# Patient Record
Sex: Male | Born: 1940 | Race: Black or African American | Hispanic: No | Smoking: Current every day smoker
Health system: Southern US, Community
[De-identification: ages and names within clinical notes are randomized; demographics above are authoritative.]

## PROBLEM LIST (undated history)

## (undated) DIAGNOSIS — R011 Cardiac murmur, unspecified: Secondary | ICD-10-CM

## (undated) DIAGNOSIS — R972 Elevated prostate specific antigen [PSA]: Secondary | ICD-10-CM

## (undated) DIAGNOSIS — N259 Disorder resulting from impaired renal tubular function, unspecified: Secondary | ICD-10-CM

## (undated) DIAGNOSIS — E669 Obesity, unspecified: Secondary | ICD-10-CM

## (undated) DIAGNOSIS — N400 Enlarged prostate without lower urinary tract symptoms: Secondary | ICD-10-CM

## (undated) DIAGNOSIS — R911 Solitary pulmonary nodule: Secondary | ICD-10-CM

## (undated) DIAGNOSIS — I10 Essential (primary) hypertension: Secondary | ICD-10-CM

## (undated) DIAGNOSIS — N138 Other obstructive and reflux uropathy: Secondary | ICD-10-CM

## (undated) DIAGNOSIS — M25569 Pain in unspecified knee: Secondary | ICD-10-CM

## (undated) DIAGNOSIS — E786 Lipoprotein deficiency: Secondary | ICD-10-CM

## (undated) DIAGNOSIS — R3911 Hesitancy of micturition: Secondary | ICD-10-CM

## (undated) DIAGNOSIS — N401 Enlarged prostate with lower urinary tract symptoms: Secondary | ICD-10-CM

## (undated) DIAGNOSIS — Z801 Family history of malignant neoplasm of trachea, bronchus and lung: Secondary | ICD-10-CM

## (undated) DIAGNOSIS — M1990 Unspecified osteoarthritis, unspecified site: Secondary | ICD-10-CM

## (undated) DIAGNOSIS — Z807 Family history of other malignant neoplasms of lymphoid, hematopoietic and related tissues: Secondary | ICD-10-CM

## (undated) DIAGNOSIS — E119 Type 2 diabetes mellitus without complications: Secondary | ICD-10-CM

## (undated) HISTORY — DX: Hesitancy of micturition: R39.11

## (undated) HISTORY — DX: Pain in unspecified knee: M25.569

## (undated) HISTORY — DX: Obesity, unspecified: E66.9

## (undated) HISTORY — DX: Other obstructive and reflux uropathy: N13.8

## (undated) HISTORY — DX: Benign prostatic hyperplasia with lower urinary tract symptoms: N40.1

## (undated) HISTORY — DX: Disorder resulting from impaired renal tubular function, unspecified: N25.9

## (undated) HISTORY — DX: Solitary pulmonary nodule: R91.1

## (undated) HISTORY — DX: Family history of other malignant neoplasms of lymphoid, hematopoietic and related tissues: Z80.7

## (undated) HISTORY — DX: Lipoprotein deficiency: E78.6

## (undated) HISTORY — DX: Benign prostatic hyperplasia without lower urinary tract symptoms: N40.0

## (undated) HISTORY — DX: Essential (primary) hypertension: I10

## (undated) HISTORY — DX: Family history of malignant neoplasm of trachea, bronchus and lung: Z80.1

## (undated) HISTORY — DX: Elevated prostate specific antigen (PSA): R97.20

## (undated) HISTORY — DX: Type 2 diabetes mellitus without complications: E11.9

---

## 2005-10-11 ENCOUNTER — Ambulatory Visit: Payer: Self-pay | Admitting: Gastroenterology

## 2006-02-25 ENCOUNTER — Ambulatory Visit: Payer: Self-pay | Admitting: Family Medicine

## 2008-10-10 ENCOUNTER — Ambulatory Visit: Payer: Self-pay | Admitting: Family Medicine

## 2009-02-26 ENCOUNTER — Ambulatory Visit: Payer: Self-pay | Admitting: Gastroenterology

## 2010-03-08 HISTORY — PX: HERNIA REPAIR: SHX51

## 2011-01-14 ENCOUNTER — Ambulatory Visit: Payer: Self-pay | Admitting: Surgery

## 2011-01-18 ENCOUNTER — Ambulatory Visit: Payer: Self-pay | Admitting: Surgery

## 2013-01-12 ENCOUNTER — Ambulatory Visit: Payer: Self-pay | Admitting: Family

## 2013-01-12 DIAGNOSIS — I379 Nonrheumatic pulmonary valve disorder, unspecified: Secondary | ICD-10-CM

## 2014-05-17 ENCOUNTER — Observation Stay: Payer: Self-pay | Admitting: Internal Medicine

## 2014-07-07 NOTE — Discharge Summary (Signed)
PATIENT NAME:  Brent Glass, Brent Glass MR#:  [id number] DATE OF BIRTH:  03/10/1940  DATE OF ADMISSION:  05/17/2014 DATE OF DISCHARGE:  05/18/2014  DISCHARGE DIAGNOSES:  1.  Acute renal failure.  2.  Dehydration.  3.  Syncope.  4.  Acute bronchitis.  5.  Diabetes mellitus type 2.  6.  Hyperlipidemia.  7.  Benign prostatic hypertrophy.  8.  Hypertension.   DISCHARGE MEDICATIONS:  1.  Aspirin 81 mg daily.  2.  Metformin 1000 mg oral 2 times a day.  3.  Glipizide 5 mg once a day.  4.  Simvastatin 40 mg daily.  5.  Proventil HFA 2 puffs inhaled 4 times a day.  6.  Hydrochlorothiazide-lisinopril 25/10 at 1/2 tablet once a day.  7.  Azithromycin 5 mg daily.    DISCHARGE INSTRUCTIONS:   Low sodium diet.  Activity as tolerated. Drink plenty of fluids to keep hydrated.   ADMITTING HISTORY AND PHYSICAL:  Please see detailed H and P dictated previously by Dr. Posey Pronto.  In brief, a 74 year old male patient with history of diabetes, hypertension, was at his urology doctor's office, where he had a sudden syncopal episode, was found to be hypotensive, transferred to the Emergency Room.  Here, the patient was found to be dehydrated.  Blood pressure low in the 96E to 95M systolic and admitted to hospitalist service.   HOSPITAL COURSE:  1.  Dehydration with acute renal failure. The patient had worsening creatinine, with elevation up to 1.8 from his baseline of 1.1.  The patient was started on IV fluids with which his blood pressure improved. No further syncope, acute renal failure resolved and he is being discharged home in a stable condition.  2.  The patient was also treated for acute bronchitis during the hospital stay with mild fever,  was started on azithromycin which will be continued after discharge.  The patient does not have more shortness of breath, very minimal wheezing on exam.   Prior to discharge, the patient's lungs sound clear. S1, S2 heard.  No edema. The patient's blood pressure medication is  being cut in half due to low normal blood pressure.   TIME SPENT ON DAY OF DISCHARGE IN DISCHARGE ACTIVITY: 40 minutes.     ____________________________ Leia Alf Kadija Cruzen, MD srs:DT D: 05/20/2014 14:58:50 ET T: 05/20/2014 17:11:07 ET JOB#: [id number]  cc: Alveta Heimlich R. Sloane Junkin, MD, <Dictator> Neita Carp MD ELECTRONICALLY SIGNED 05/22/2014 8:30

## 2014-07-07 NOTE — H&P (Signed)
PATIENT NAME:  Brent Glass, CRIPPS MR#:  [id number] DATE OF BIRTH:  Jul 07, 1940  DATE OF ADMISSION:  05/17/2014  PRIMARY CARE PHYSICIAN:  Dr. Volanda Napoleon at [facility] clinic.   CHIEF COMPLAINT: I passed out today.   HISTORY OF PRESENT ILLNESS: Brent Glass is a 74 year old African-American gentleman with past medical history of hypercholesterolemia and diabetes and hypertension, comes to the Emergency Room after he had a syncopal episode at [facility] office. He had his routine appointment for his prostate evaluation. The patient was in the waiting area, started feeling hot, became diaphoretic, and the next thing the staff found him with a syncopal episode and he had slid down to the floor from his chair. He was brought to the Emergency Room, was found to be hypotensive, appeared cold and clammy. He received some IV fluids and currently is feeling better. His blood pressure admission was systolic in the 35W, current blood pressure is systolic 656. He denies any chest pain, shortness of breath, any focal weakness.   PAST MEDICAL HISTORY:  1.  Hypercholesterolemia.  2.  Type 2 diabetes.   ALLERGIES: No known drug allergies.   MEDICATIONS:  1. Metformin 1000 mg p.o. b.i.d.  2. Glipizide XL 5 mg p.o. daily.  3. Aspirin 81 mg daily.  4. Proventil HFA 1 puff t.i.d. p.r.n.  5. Zocor 40 mg daily.  6. Lisinopril/hydrochlorothiazide 20/25 one tablet daily.   FAMILY HISTORY: Positive for hypertension and diabetes.   SOCIAL HISTORY: Currently smokes, every day smoker. Smoking cessation advised. About 4 minutes spent. Denies any alcohol or any other drug use.   REVIEW OF SYSTEMS:    CONSTITUTIONAL: No fever. Positive for fatigue, weakness.  EYES: No blurred or double vision. No glaucoma or cataract.  EARS, NOSE, AND THROAT: No tinnitus, ear pain, hearing loss, epistaxis.  RESPIRATORY: No cough, wheeze, hemoptysis, COPD.   CARDIOVASCULAR: No chest pain, orthopnea, edema. Positive for syncope.   GASTROINTESTINAL: No nausea, vomiting, diarrhea, abdominal pain. No GERD.  GENITOURINARY: No dysuria, hematuria, frequency.  ENDOCRINE: No polyuria, nocturia, or thyroid problems.  HEMATOLOGY: No anemia or easy bruising or bleeding.  SKIN: No acne, rash. The patient does have some chronic lesions over his face around the nose.  MUSCULOSKELETAL: No arthritis, cramps, or gout.   NEUROLOGIC:  No CVA, TIA,  seizures, or dementia.  PSYCHIATRIC:  No anxiety or depression.   All other systems reviewed and negative.   PHYSICAL EXAMINATION:  GENERAL: The patient is awake, alert, oriented x 3, not in acute distress.  VITAL SIGNS: Afebrile, pulse is 99, blood pressure is 118/72, saturation is 100% on room air.  HEENT: Atraumatic, normocephalic. Pupils PERRLA.  EOM intact. Oral mucosa is moist.  NECK: Supple. No JVD. No carotid bruit.  RESPIRATORY: Clear to auscultation bilaterally. No rales, rhonchi, respiratory distress, or labored breathing.  ABDOMEN: Soft, benign, nontender. No organomegaly. Positive bowel sounds.  NEUROLOGIC: Grossly intact cranial nerves II through XII. No motor or sensory deficit.  PSYCHIATRIC: The patient is awake, alert, oriented x 3.  SKIN: Warm and dry.   LABORATORY DATA:  EKG shows normal sinus rhythm with left anterior fascicular block. I have no old EKG for comparison. Glucose is 188, BUN 21, creatinine 1.79, potassium is 3.5. CBC within normal limits except white count of 18.8. Echo Doppler in 2014 showed EF of 60%-65%, normal global left ventricular function, impaired relaxation pattern of LV diastolic filling, no valvular abnormalities.   ASSESSMENT AND PLAN: A 74 year old, Brent Glass, with history of hypertension,  diabetes, and hyperlipidemia, comes in with:   1.  Syncopal episode with hypotension likely in the setting of dehydration and renal insufficiency. The patient's blood pressure at the doctor's office was systolic in the 95K, on arrival to the Emergency Room  his blood pressure was systolic 99. He received IV fluids. Blood pressure currently is 118/72. He feels relatively much improved. His EKG shows sinus rhythm. No source of infection identified. We will continue IV hydration, hold all blood pressure medications, and resume once blood pressure is stable.  2.  Acute renal insufficiency with mild dehydration. I do not have a baseline creatinine. I will hold off on lisinopril, metformin, hydrochlorothiazide, and hydrate the patient. Once blood pressure is stable and creatinine has improved resume above medications.  3.  History of hypertension, but presently relative hypotension. Hold blood pressure medications for now.  4.  Type 2 diabetes. Continue glipizide. I will hold off on metformin due to elevated creatinine. We will continue sliding scale insulin for now.  5.  Hyperlipidemia. Continue Zocor.  6.  Deep vein thrombosis prophylaxis. Subcutaneous heparin.   The above was discussed with the patient and the patient's daughter.   TIME SPENT: 40 minutes.      ____________________________ Hart Rochester Posey Pronto, MD sap:bu D: 05/17/2014 13:40:51 ET T: 05/17/2014 14:19:38 ET JOB#: [id number]  cc: Liller Yohn A. Posey Pronto, MD, <Dictator> Dr. Volanda Napoleon at [place] MD ELECTRONICALLY SIGNED 05/19/2014 18:21

## 2014-11-06 ENCOUNTER — Encounter: Payer: Self-pay | Admitting: Urology

## 2014-11-13 ENCOUNTER — Encounter: Payer: Self-pay | Admitting: *Deleted

## 2014-11-15 ENCOUNTER — Ambulatory Visit
Admission: RE | Admit: 2014-11-15 | Discharge: 2014-11-15 | Disposition: A | Discharge status: Home / Self Care | Payer: Medicare HMO | Source: Ambulatory Visit | Attending: Physician Assistant | Admitting: Physician Assistant

## 2014-11-15 ENCOUNTER — Other Ambulatory Visit: Payer: Self-pay | Admitting: Physician Assistant

## 2014-11-15 DIAGNOSIS — M1712 Unilateral primary osteoarthritis, left knee: Secondary | ICD-10-CM | POA: Insufficient documentation

## 2014-11-15 DIAGNOSIS — M25562 Pain in left knee: Secondary | ICD-10-CM

## 2014-11-19 ENCOUNTER — Encounter: Payer: Self-pay | Admitting: Urology

## 2014-11-19 ENCOUNTER — Ambulatory Visit (INDEPENDENT_AMBULATORY_CARE_PROVIDER_SITE_OTHER): Payer: Medicare PPO | Admitting: Urology

## 2014-11-19 VITALS — BP 153/76 | HR 82 | Ht 61.75 in | Wt 238.4 lb

## 2014-11-19 DIAGNOSIS — N401 Enlarged prostate with lower urinary tract symptoms: Secondary | ICD-10-CM | POA: Diagnosis not present

## 2014-11-19 DIAGNOSIS — R972 Elevated prostate specific antigen [PSA]: Secondary | ICD-10-CM | POA: Diagnosis not present

## 2014-11-19 DIAGNOSIS — N138 Other obstructive and reflux uropathy: Secondary | ICD-10-CM

## 2014-11-19 MED ORDER — LIDOCAINE HCL 2 % EX GEL
1.0000 "application " | Freq: Once | CUTANEOUS | Status: AC
  Administered 2014-11-19: 1 via URETHRAL

## 2014-11-19 NOTE — Progress Notes (Signed)
Catheter deleted note was on the wrong patient.

## 2014-11-19 NOTE — Progress Notes (Signed)
11/19/2014 10:42 AM   Roxy Cedar 21-Jun-1940 [id number]  Referring provider: No referring provider defined for this encounter.  Chief Complaint  Patient presents with  . Elevated PSA    6 month recheck  . Benign Prostatic Hypertrophy    HPI: Patient is 74 year old African-American male who presents today for his 6 month follow-up for BPH with LUTS and a history of elevated PSA.  BPH WITH LUTS His IPSS score today is 5, which is mild lower urinary tract symptomatology. He is pleased with his quality life due to his urinary symptoms.  He denies any dysuria, hematuria or suprapubic pain.  He also denies any recent fevers, chills, nausea or vomiting.   He does not have a family history of PCa.      IPSS      11/19/14 1000       International Prostate Symptom Score   How often have you had the sensation of not emptying your bladder? Not at All     How often have you had to urinate less than every two hours? Less than 1 in 5 times     How often have you found you stopped and started again several times when you urinated? Not at All     How often have you found it difficult to postpone urination? Less than 1 in 5 times     How often have you had a weak urinary stream? Less than half the time     How often have you had to strain to start urination? Not at All     How many times did you typically get up at night to urinate? 1 Time     Total IPSS Score 5     Quality of Life due to urinary symptoms   If you were to spend the rest of your life with your urinary condition just the way it is now how would you feel about that? Pleased        Score:  1-7 Mild 8-19 Moderate 20-35 Severe  Elevated PSA Patient was found to have an elevated PSA of 4.9 ng/mL on 08/23/2011. A PSA, free at that time was 0.95 indicating a 20% probability of the patient having prostate cancer. We have continue to monitor his PSA every 6 months. It has continued a downward trend.  He has not undergone  biopsy and he has no family history of prostate cancer.   PMH: Past Medical History  Diagnosis Date  . Diabetes   . Low HDL (under 40)   . Knee pain   . Hypertension   . Disorder resulting from impaired renal function   . Obesity   . BPH with obstruction/lower urinary tract symptoms   . BPH (benign prostatic hyperplasia)   . Elevated PSA   . Urinary hesitancy     Surgical History: Past Surgical History  Procedure Laterality Date  . Hernia repair  2012    Home Medications:    Medication List       This list is accurate as of: 11/19/14 11:59 PM.  Always use your most recent med list.               albuterol 108 (90 BASE) MCG/ACT inhaler  Commonly known as:  PROVENTIL HFA;VENTOLIN HFA  Inhale 2 puffs into the lungs every 6 (six) hours as needed for wheezing or shortness of breath.     aspirin 81 MG tablet  Take 81 mg by mouth daily.  glipiZIDE 10 MG 24 hr tablet  Commonly known as:  GLUCOTROL XL  1 BY MOUTH DAILY FOR DIABETES     lisinopril-hydrochlorothiazide 20-25 MG per tablet  Commonly known as:  PRINZIDE,ZESTORETIC  Take 1 tablet by mouth daily.     metFORMIN 1000 MG tablet  Commonly known as:  GLUCOPHAGE  Take 1,000 mg by mouth 2 (two) times daily with a meal.     simvastatin 40 MG tablet  Commonly known as:  ZOCOR  Take 40 mg by mouth daily.        Allergies: No Known Allergies  Family History: Family History  Problem Relation Age of Onset  . Diabetes Mother   . Kidney disease Neg Hx   . Prostate cancer Neg Hx     Social History:  reports that he has been smoking.  He does not have any smokeless tobacco history on file. He reports that he does not drink alcohol or use illicit drugs.  ROS: UROLOGY Frequent Urination?: No Hard to postpone urination?: No Burning/pain with urination?: No Get up at night to urinate?: No Leakage of urine?: No Urine stream starts and stops?: No Trouble starting stream?: No Do you have to strain to  urinate?: No Blood in urine?: No Urinary tract infection?: No Sexually transmitted disease?: No Injury to kidneys or bladder?: No Painful intercourse?: No Weak stream?: No Erection problems?: No Penile pain?: No  Gastrointestinal Nausea?: No Vomiting?: No Indigestion/heartburn?: No Diarrhea?: No Constipation?: No  Constitutional Fever: No Night sweats?: No Weight loss?: No Fatigue?: No  Skin Skin rash/lesions?: No Itching?: No  Eyes Blurred vision?: No Double vision?: No  Ears/Nose/Throat Sore throat?: No Sinus problems?: No  Hematologic/Lymphatic Swollen glands?: No Easy bruising?: No  Cardiovascular Leg swelling?: No Chest pain?: No  Respiratory Cough?: No Shortness of breath?: No  Endocrine Excessive thirst?: No  Musculoskeletal Back pain?: No Joint pain?: Yes  Neurological Headaches?: No Dizziness?: No  Psychologic Depression?: No Anxiety?: No  Physical Exam: BP 153/76 mmHg  Pulse 82  Ht 5' 1.75" (1.568 m)  Wt 238 lb 6.4 oz (108.138 kg)  BMI 43.98 kg/m2  GU: Patient with uncircumcised phallus. Foreskin easily retracted  Urethral meatus is patent.  No penile discharge. No penile lesions or rashes. Scrotum without lesions, cysts, rashes and/or edema.  Testicles are located scrotally bilaterally. No masses are appreciated in the testicles. Left and right epididymis are normal. Rectal: Patient with  normal sphincter tone. Perineum without scarring or rashes. No rectal masses are appreciated. Prostate is approximately 50 grams, no nodules are appreciated. Seminal vesicles are normal.    Laboratory Data:  PSA History:  4.9 ng/mL on 08/23/2011  0.95 on 09/27/2011 PSA,free  3.4 ng/mL on 04/19/2012  3.5 ng/mL on 10/31/2012  3.0 ng/mL on 05/18/2013  3.6 ng/mL on 11/16/2013  Assessment & Plan:    1. BPH (benign prostatic hyperplasia) with LUTS:   Patient's IPSS score is 5/1.  His DRE demonstrates benign enlargement.  We will continue  observation over time with prn avoidance of alcohol/caffeine.  He will follow up in 6 months for a PSA, DRE, PVR and an IPSS.    - PSA  2. Elevated PSA:   Patient was found to have an elevated PSA of 4.9 ng/mL on 08/23/2011. A PSA, free at that time was 0.95 indicating a 20% probability of the patient having prostate cancer. We have continue to monitor his PSA every 6 months. It has continued a downward trend.  He has not undergone  biopsy and he has no family history of prostate cancer.  Return in about 6 months (around 05/19/2015) for IPSS.  [name], [facility] [address] [place], [place] [address] [phone]

## 2014-11-19 NOTE — Progress Notes (Deleted)
Simple Catheter Placement  Due to urinary retention patient is present today for a foley cath placement.  Patient was cleaned and prepped in a sterile fashion with betadine and lidocaine jelly 2% was instilled into the urethra.  A 16 FR foley catheter was inserted, urine return was noted  280ml, urine was clear and dark yellow in color.  The balloon was filled with 10cc of sterile water.  A leg bag was attached for drainage. Patient was also given a night bag to take home and was given instruction on how to change from one bag to another.  Patient was given instruction on proper catheter care.  Patient tolerated well, no complications were noted   Preformed by: Nakira Litzau CMA  

## 2014-11-20 ENCOUNTER — Telehealth: Payer: Self-pay

## 2014-11-20 DIAGNOSIS — R972 Elevated prostate specific antigen [PSA]: Secondary | ICD-10-CM

## 2014-11-20 LAB — PSA: Prostate Specific Ag, Serum: 4.1 ng/mL — ABNORMAL HIGH (ref 0.0–4.0)

## 2014-11-20 NOTE — Telephone Encounter (Signed)
-----   Message from Nori Riis, PA-C sent at 11/20/2014  8:34 AM EDT ----- Patient's PSA is stable.  We will see him in 6 months.  PSA to be drawn before his next appointment.

## 2014-11-20 NOTE — Telephone Encounter (Signed)
VM not set up yet.

## 2014-11-21 DIAGNOSIS — N138 Other obstructive and reflux uropathy: Secondary | ICD-10-CM | POA: Insufficient documentation

## 2014-11-21 DIAGNOSIS — R972 Elevated prostate specific antigen [PSA]: Secondary | ICD-10-CM | POA: Insufficient documentation

## 2014-11-21 DIAGNOSIS — N401 Enlarged prostate with lower urinary tract symptoms: Principal | ICD-10-CM

## 2014-11-21 NOTE — Telephone Encounter (Signed)
Spoke with pt in reference to lab results. Pt voiced understanding. PSA orders placed in future.

## 2014-11-21 NOTE — Telephone Encounter (Signed)
-----   Message from Nori Riis, PA-C sent at 11/20/2014  8:34 AM EDT ----- Patient's PSA is stable.  We will see him in 6 months.  PSA to be drawn before his next appointment.

## 2015-02-21 ENCOUNTER — Other Ambulatory Visit: Payer: Self-pay | Admitting: Physician Assistant

## 2015-02-21 DIAGNOSIS — M2392 Unspecified internal derangement of left knee: Secondary | ICD-10-CM

## 2015-03-14 ENCOUNTER — Ambulatory Visit
Admission: RE | Admit: 2015-03-14 | Discharge: 2015-03-14 | Disposition: A | Discharge status: Home / Self Care | Payer: Medicare PPO | Source: Ambulatory Visit | Attending: Physician Assistant | Admitting: Physician Assistant

## 2015-03-14 DIAGNOSIS — M2392 Unspecified internal derangement of left knee: Secondary | ICD-10-CM | POA: Diagnosis not present

## 2015-03-14 DIAGNOSIS — M25562 Pain in left knee: Secondary | ICD-10-CM | POA: Diagnosis not present

## 2015-04-03 ENCOUNTER — Encounter
Admission: RE | Admit: 2015-04-03 | Discharge: 2015-04-03 | Disposition: A | Discharge status: Home / Self Care | Payer: Medicare PPO | Source: Ambulatory Visit | Attending: Orthopedic Surgery | Admitting: Orthopedic Surgery

## 2015-04-03 DIAGNOSIS — Z01810 Encounter for preprocedural cardiovascular examination: Secondary | ICD-10-CM | POA: Insufficient documentation

## 2015-04-03 DIAGNOSIS — Z01812 Encounter for preprocedural laboratory examination: Secondary | ICD-10-CM | POA: Diagnosis present

## 2015-04-03 HISTORY — DX: Cardiac murmur, unspecified: R01.1

## 2015-04-03 HISTORY — DX: Unspecified osteoarthritis, unspecified site: M19.90

## 2015-04-03 LAB — CBC
HCT: 40.4 % (ref 40.0–52.0)
HEMOGLOBIN: 13.3 g/dL (ref 13.0–18.0)
MCH: 28.1 pg (ref 26.0–34.0)
MCHC: 32.9 g/dL (ref 32.0–36.0)
MCV: 85.5 fL (ref 80.0–100.0)
Platelets: 261 10*3/uL (ref 150–440)
RBC: 4.72 MIL/uL (ref 4.40–5.90)
RDW: 14.4 % (ref 11.5–14.5)
WBC: 9.7 10*3/uL (ref 3.8–10.6)

## 2015-04-03 LAB — BASIC METABOLIC PANEL
ANION GAP: 12 (ref 5–15)
BUN: 15 mg/dL (ref 6–20)
CHLORIDE: 105 mmol/L (ref 101–111)
CO2: 21 mmol/L — ABNORMAL LOW (ref 22–32)
Calcium: 9.5 mg/dL (ref 8.9–10.3)
Creatinine, Ser: 1.11 mg/dL (ref 0.61–1.24)
GFR calc Af Amer: >60 mL/min (ref >60)
GFR calc non Af Amer: >60 mL/min (ref >60)
GLUCOSE: 193 mg/dL — AB (ref 65–99)
POTASSIUM: 3.8 mmol/L (ref 3.5–5.1)
Sodium: 138 mmol/L (ref 135–145)

## 2015-04-03 NOTE — Patient Instructions (Signed)
  Your procedure is scheduled on: April 10, 2015 (Thursday) Report to Day Surgery. [facility]) Second Floor To find out your arrival time please call [phone] between 1PM - 3PM on April 09, 2015 (Wednesday).  Remember: Instructions that are not followed completely may result in serious medical risk, up to and including death, or upon the discretion of your surgeon and anesthesiologist your surgery may need to be rescheduled.    __x__ 1. Do not eat food or drink liquids after midnight. No gum chewing or hard candies.     __x__ 2. No Alcohol for 24 hours before or after surgery.   ____ 3. Bring all medications with you on the day of surgery if instructed.    __x__ 4. Notify your doctor if there is any change in your medical condition     (cold, fever, infections).     Do not wear jewelry, make-up, hairpins, clips or nail polish.  Do not wear lotions, powders, or perfumes. You may wear deodorant.  Do not shave 48 hours prior to surgery. Men may shave face and neck.  Do not bring valuables to the hospital.    [facility] is not responsible for any belongings or valuables.               Contacts, dentures or bridgework may not be worn into surgery.  Leave your suitcase in the car. After surgery it may be brought to your room.  For patients admitted to the hospital, discharge time is determined by your                treatment team.   Patients discharged the day of surgery will not be allowed to drive home.   Please read over the following fact sheets that you were given:   Surgical Site Infection Prevention   ____ Take these medicines the morning of surgery with A SIP OF WATER:    1.   2.   3.   4.  5.  6.  ____ Fleet Enema (as directed)   __x__ Use CHG Soap as directed  __x__ Use inhalers on the day of surgery (Use Albuterol inhaler the day of surgery and bring to hospital)  __x__ Stop metformin 2 days prior to surgery(Stop Metformin on January 31)    ____ Take  1/2 of usual insulin dose the night before surgery and none on the morning of surgery.       _x_ Stop Coumadin/Plavix/aspirin on (Stop Aspirin now)   __x_ Stop Anti-inflammatories on (NO NSAIDS, Tylenol ok to take for pain if needed)   ____ Stop supplements until after surgery.    ____ Bring C-Pap to the hospital.

## 2015-04-10 ENCOUNTER — Ambulatory Visit: Payer: Medicare PPO | Admitting: Anesthesiology

## 2015-04-10 ENCOUNTER — Encounter
Admission: RE | Disposition: A | Discharge status: Home / Self Care | Payer: Self-pay | Source: Ambulatory Visit | Attending: Orthopedic Surgery

## 2015-04-10 ENCOUNTER — Ambulatory Visit
Admission: RE | Admit: 2015-04-10 | Discharge: 2015-04-10 | Disposition: A | Discharge status: Home / Self Care | Payer: Medicare PPO | Source: Ambulatory Visit | Attending: Orthopedic Surgery | Admitting: Orthopedic Surgery

## 2015-04-10 DIAGNOSIS — M7122 Synovial cyst of popliteal space [Baker], left knee: Secondary | ICD-10-CM | POA: Insufficient documentation

## 2015-04-10 DIAGNOSIS — Z7951 Long term (current) use of inhaled steroids: Secondary | ICD-10-CM | POA: Insufficient documentation

## 2015-04-10 DIAGNOSIS — Z7982 Long term (current) use of aspirin: Secondary | ICD-10-CM | POA: Insufficient documentation

## 2015-04-10 DIAGNOSIS — I10 Essential (primary) hypertension: Secondary | ICD-10-CM | POA: Insufficient documentation

## 2015-04-10 DIAGNOSIS — M6752 Plica syndrome, left knee: Secondary | ICD-10-CM | POA: Diagnosis not present

## 2015-04-10 DIAGNOSIS — R011 Cardiac murmur, unspecified: Secondary | ICD-10-CM | POA: Diagnosis not present

## 2015-04-10 DIAGNOSIS — S83232A Complex tear of medial meniscus, current injury, left knee, initial encounter: Secondary | ICD-10-CM | POA: Insufficient documentation

## 2015-04-10 DIAGNOSIS — J449 Chronic obstructive pulmonary disease, unspecified: Secondary | ICD-10-CM | POA: Diagnosis not present

## 2015-04-10 DIAGNOSIS — E119 Type 2 diabetes mellitus without complications: Secondary | ICD-10-CM | POA: Insufficient documentation

## 2015-04-10 DIAGNOSIS — X58XXXA Exposure to other specified factors, initial encounter: Secondary | ICD-10-CM | POA: Insufficient documentation

## 2015-04-10 DIAGNOSIS — Z7984 Long term (current) use of oral hypoglycemic drugs: Secondary | ICD-10-CM | POA: Insufficient documentation

## 2015-04-10 DIAGNOSIS — Z833 Family history of diabetes mellitus: Secondary | ICD-10-CM | POA: Diagnosis not present

## 2015-04-10 DIAGNOSIS — M659 Synovitis and tenosynovitis, unspecified: Secondary | ICD-10-CM | POA: Diagnosis not present

## 2015-04-10 DIAGNOSIS — N289 Disorder of kidney and ureter, unspecified: Secondary | ICD-10-CM | POA: Diagnosis not present

## 2015-04-10 DIAGNOSIS — Z8249 Family history of ischemic heart disease and other diseases of the circulatory system: Secondary | ICD-10-CM | POA: Diagnosis not present

## 2015-04-10 DIAGNOSIS — F17200 Nicotine dependence, unspecified, uncomplicated: Secondary | ICD-10-CM | POA: Insufficient documentation

## 2015-04-10 DIAGNOSIS — Z79899 Other long term (current) drug therapy: Secondary | ICD-10-CM | POA: Insufficient documentation

## 2015-04-10 DIAGNOSIS — S83239A Complex tear of medial meniscus, current injury, unspecified knee, initial encounter: Secondary | ICD-10-CM | POA: Diagnosis present

## 2015-04-10 HISTORY — PX: KNEE ARTHROSCOPY: SHX127

## 2015-04-10 LAB — GLUCOSE, CAPILLARY
GLUCOSE-CAPILLARY: 165 mg/dL — AB (ref 65–99)
Glucose-Capillary: 232 mg/dL — ABNORMAL HIGH (ref 65–99)

## 2015-04-10 SURGERY — ARTHROSCOPY, KNEE
Anesthesia: General | Laterality: Left | Wound class: Clean

## 2015-04-10 MED ORDER — ONDANSETRON HCL 4 MG/2ML IJ SOLN
INTRAMUSCULAR | Status: DC | PRN
  Administered 2015-04-10: 4 mg via INTRAVENOUS

## 2015-04-10 MED ORDER — FENTANYL CITRATE (PF) 100 MCG/2ML IJ SOLN
25.0000 ug | INTRAMUSCULAR | Status: DC | PRN
  Administered 2015-04-10 (×4): 25 ug via INTRAVENOUS

## 2015-04-10 MED ORDER — ACETAMINOPHEN 10 MG/ML IV SOLN
INTRAVENOUS | Status: DC | PRN
  Administered 2015-04-10: 1000 mg via INTRAVENOUS

## 2015-04-10 MED ORDER — ONDANSETRON HCL 4 MG/2ML IJ SOLN: 4.0000 mg | Freq: Once | INTRAMUSCULAR | Status: DC | PRN

## 2015-04-10 MED ORDER — SODIUM CHLORIDE 0.9 % IV SOLN: INTRAVENOUS | Status: DC

## 2015-04-10 MED ORDER — BUPIVACAINE-EPINEPHRINE (PF) 0.5% -1:200000 IJ SOLN
INTRAMUSCULAR | Status: AC
  Filled 2015-04-10: qty 30

## 2015-04-10 MED ORDER — LIDOCAINE HCL (CARDIAC) 20 MG/ML IV SOLN
INTRAVENOUS | Status: DC | PRN
  Administered 2015-04-10: 100 mg via INTRAVENOUS

## 2015-04-10 MED ORDER — FAMOTIDINE 20 MG PO TABS
20.0000 mg | ORAL_TABLET | Freq: Once | ORAL | Status: AC
  Administered 2015-04-10: 20 mg via ORAL

## 2015-04-10 MED ORDER — METOCLOPRAMIDE HCL 10 MG PO TABS: 5.0000 mg | ORAL_TABLET | Freq: Three times a day (TID) | ORAL | Status: DC | PRN

## 2015-04-10 MED ORDER — METOCLOPRAMIDE HCL 5 MG/ML IJ SOLN: 5.0000 mg | Freq: Three times a day (TID) | INTRAMUSCULAR | Status: DC | PRN

## 2015-04-10 MED ORDER — MIDAZOLAM HCL 5 MG/5ML IJ SOLN
INTRAMUSCULAR | Status: DC | PRN
  Administered 2015-04-10: 2 mg via INTRAVENOUS

## 2015-04-10 MED ORDER — ONDANSETRON HCL 4 MG/2ML IJ SOLN: 4.0000 mg | Freq: Four times a day (QID) | INTRAMUSCULAR | Status: DC | PRN

## 2015-04-10 MED ORDER — FAMOTIDINE 20 MG PO TABS
ORAL_TABLET | ORAL | Status: AC
  Administered 2015-04-10: 20 mg via ORAL
  Filled 2015-04-10: qty 1

## 2015-04-10 MED ORDER — ONDANSETRON HCL 4 MG PO TABS: 4.0000 mg | ORAL_TABLET | Freq: Four times a day (QID) | ORAL | Status: DC | PRN

## 2015-04-10 MED ORDER — HYDROCODONE-ACETAMINOPHEN 5-325 MG PO TABS: 1.0000 | ORAL_TABLET | ORAL | Status: DC | PRN

## 2015-04-10 MED ORDER — FENTANYL CITRATE (PF) 100 MCG/2ML IJ SOLN
INTRAMUSCULAR | Status: DC | PRN
  Administered 2015-04-10 (×3): 50 ug via INTRAVENOUS

## 2015-04-10 MED ORDER — SODIUM CHLORIDE 0.9 % IV SOLN
INTRAVENOUS | Status: DC
  Administered 2015-04-10: 10:00:00 via INTRAVENOUS

## 2015-04-10 MED ORDER — HYDROCODONE-ACETAMINOPHEN 5-325 MG PO TABS: 1.0000 | ORAL_TABLET | Freq: Four times a day (QID) | ORAL | Status: DC | PRN

## 2015-04-10 MED ORDER — FENTANYL CITRATE (PF) 100 MCG/2ML IJ SOLN
INTRAMUSCULAR | Status: AC
  Administered 2015-04-10: 25 ug via INTRAVENOUS
  Filled 2015-04-10: qty 2

## 2015-04-10 MED ORDER — PROPOFOL 10 MG/ML IV BOLUS
INTRAVENOUS | Status: DC | PRN
  Administered 2015-04-10: 200 mg via INTRAVENOUS

## 2015-04-10 MED ORDER — ACETAMINOPHEN 10 MG/ML IV SOLN
INTRAVENOUS | Status: AC
  Filled 2015-04-10: qty 100

## 2015-04-10 SURGICAL SUPPLY — 30 items
BANDAGE ACE 4X5 VEL STRL LF (GAUZE/BANDAGES/DRESSINGS) ×3 IMPLANT
BANDAGE ELASTIC 4 LF NS (GAUZE/BANDAGES/DRESSINGS) ×3 IMPLANT
BLADE FULL RADIUS 3.5 (BLADE) ×3 IMPLANT
BLADE INCISOR PLUS 4.5 (BLADE) ×3 IMPLANT
BLADE SHAVER 4.5 DBL SERAT CV (CUTTER) IMPLANT
BLADE SHAVER 4.5X7 STR FR (MISCELLANEOUS) IMPLANT
CHLORAPREP W/TINT 26ML (MISCELLANEOUS) ×3 IMPLANT
CUTTER AGGRESSIVE+ 3.5 (CUTTER) IMPLANT
GAUZE PETRO XEROFOAM 1X8 (MISCELLANEOUS) ×3 IMPLANT
GAUZE SPONGE 4X4 12PLY STRL (GAUZE/BANDAGES/DRESSINGS) ×3 IMPLANT
GLOVE BIO SURGEON STRL SZ7 (GLOVE) ×3 IMPLANT
GLOVE BIOGEL PI IND STRL 9 (GLOVE) ×1 IMPLANT
GLOVE BIOGEL PI INDICATOR 9 (GLOVE) ×2
GLOVE INDICATOR 7.5 STRL GRN (GLOVE) ×3 IMPLANT
GLOVE SURG ORTHO 9.0 STRL STRW (GLOVE) ×3 IMPLANT
GOWN SPECIALTY ULTRA XL (MISCELLANEOUS) ×3 IMPLANT
GOWN STRL REUS W/ TWL LRG LVL3 (GOWN DISPOSABLE) ×1 IMPLANT
GOWN STRL REUS W/TWL LRG LVL3 (GOWN DISPOSABLE) ×2
IV LACTATED RINGER IRRG 3000ML (IV SOLUTION) ×8
IV LR IRRIG 3000ML ARTHROMATIC (IV SOLUTION) ×4 IMPLANT
KIT RM TURNOVER STRD PROC AR (KITS) ×3 IMPLANT
MANIFOLD NEPTUNE II (INSTRUMENTS) ×3 IMPLANT
PACK ARTHROSCOPY KNEE (MISCELLANEOUS) ×3 IMPLANT
SET TUBE SUCT SHAVER OUTFL 24K (TUBING) ×3 IMPLANT
SET TUBE TIP INTRA-ARTICULAR (MISCELLANEOUS) ×3 IMPLANT
SUT ETHILON 4-0 (SUTURE)
SUT ETHILON 4-0 FS2 18XMFL BLK (SUTURE)
SUTURE ETHLN 4-0 FS2 18XMF BLK (SUTURE) IMPLANT
TUBING ARTHRO INFLOW-ONLY STRL (TUBING) ×3 IMPLANT
WAND HAND CNTRL MULTIVAC 50 (MISCELLANEOUS) IMPLANT

## 2015-04-10 NOTE — Anesthesia Procedure Notes (Signed)
Procedure Name: LMA Insertion Date/Time: 04/10/2015 12:24 PM Performed by: Delaney Meigs Pre-anesthesia Checklist: Patient identified, Emergency Drugs available, Suction available, Patient being monitored and Timeout performed Patient Re-evaluated:Patient Re-evaluated prior to inductionOxygen Delivery Method: Circle system utilized and Simple face mask Preoxygenation: Pre-oxygenation with 100% oxygen Intubation Type: IV induction Ventilation: Mask ventilation without difficulty LMA Size: 4.5 Number of attempts: 1 Placement Confirmation: breath sounds checked- equal and bilateral and positive ETCO2 Tube secured with: Tape

## 2015-04-10 NOTE — Transfer of Care (Signed)
Immediate Anesthesia Transfer of Care Note  Patient: Brent Glass  Procedure(s) Performed: Procedure(s): ARTHROSCOPY KNEE, partial medial meniscectomy, partial synovectomy (Left)  Patient Location: PACU  Anesthesia Type:General  Level of Consciousness: sedated  Airway & Oxygen Therapy: Patient Spontanous Breathing and Patient connected to face mask oxygen  Post-op Assessment: Report given to RN and Post -op Vital signs reviewed and stable  Post vital signs: Reviewed and stable  Last Vitals:  Filed Vitals:   04/10/15 0926  BP: 121/63  Pulse: 86  Temp: 37.2 C  Resp: 18    Complications: No apparent anesthesia complications

## 2015-04-10 NOTE — Op Note (Signed)
04/10/2015  1:21 PM  PATIENT:  Brent Glass  75 y.o. 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 After patient education timeout procedures were completed an inferior lateral portal was made and the arthroscope was introduced initial inspection revealed extensive synovitis in the suprapatellar pouch and a thick plica band extending across the medial femoral condyle and in the patellofemoral joint that appeared to impinge on the joint coming out to the trochlear groove there was significant degenerative changes to the patella and trochlear groove but no exposed bone there was significant partial-thickness cartilage loss over the entire patella. The patella tracked normally coming the medial compartment inferior medial portal was made and on probing there is a complex tear of the medial meniscus with stump medial centrally and very posteriorly the could be displaced into the joint this was debrided with use of a meniscal punch and shaver followed by her care wand to get back to a stable margin is also Baker cyst on the medial side and the posterior capsules opened to drain this through small opening posteriorly through the meniscus tear. Articular cartilage showed significant wear of the  medial compartment with some areas of exposed bone and loose flaps of cartilage were debrided. The anterior cruciate ligament was intact and lateral compartment was relatively normal with just some very mild fibrillation changes to the articular cartilage and normal meniscus the gutters were checked were free of any loose bodies this she the shaver was then used to debride the extensive plica band no longer impinged over the femoral condyle or the patellofemoral joint. The knee was then irrigated until clear. Argentation was withdrawn and the portals injected with a total of 20 cc half percent Sensorcaine for postop analgesia Xeroform 4 x 4 web roll and Ace wrap were applied  PLAN OF CARE: Discharge to home after PACU  PATIENT DISPOSITION:  PACU - hemodynamically stable.

## 2015-04-10 NOTE — Anesthesia Postprocedure Evaluation (Signed)
Anesthesia Post Note  Patient: Brent Glass  Procedure(s) Performed: Procedure(s) (LRB): ARTHROSCOPY KNEE, partial medial meniscectomy, partial synovectomy (Left)  Patient location during evaluation: PACU Anesthesia Type: General Level of consciousness: awake and alert Pain management: satisfactory to patient Vital Signs Assessment: post-procedure vital signs reviewed and stable Respiratory status: respiratory function stable and patient connected to nasal cannula oxygen Cardiovascular status: blood pressure returned to baseline Anesthetic complications: no    Last Vitals:  Filed Vitals:   04/10/15 0926 04/10/15 1326  BP: 121/63 141/79  Pulse: 86 86  Temp: 37.2 C 36.4 C  Resp: 18 16    Last Pain: There were no vitals filed for this visit.               VAN STAVEREN,[name]

## 2015-04-10 NOTE — Discharge Instructions (Signed)
Aspirin 1 a day. Leave dressing in place unless it sized down the leg. Keep dressing clean and dry. Weightbearing as tolerated on left leg

## 2015-04-10 NOTE — Anesthesia Preprocedure Evaluation (Signed)
Anesthesia Evaluation  Patient identified by MRN, date of birth, ID band Patient awake    Reviewed: Allergy & Precautions, NPO status , Patient's Chart, lab work & pertinent test results  Airway Mallampati: II       Dental no notable dental hx.    Pulmonary COPD, Current Smoker,    + rhonchi  + decreased breath sounds      Cardiovascular Exercise Tolerance: Good hypertension, Pt. on medications  Rhythm:Regular     Neuro/Psych    GI/Hepatic negative GI ROS, Neg liver ROS,   Endo/Other  diabetes, Type 2, Oral Hypoglycemic Agents  Renal/GU negative Renal ROS     Musculoskeletal   Abdominal Normal abdominal exam  (+)   Peds  Hematology   Anesthesia Other Findings   Reproductive/Obstetrics                             Anesthesia Physical Anesthesia Plan  ASA: III  Anesthesia Plan: General   Post-op Pain Management:    Induction: Intravenous  Airway Management Planned: LMA  Additional Equipment:   Intra-op Plan:   Post-operative Plan: Extubation in OR  Informed Consent: I have reviewed the patients History and Physical, chart, labs and discussed the procedure including the risks, benefits and alternatives for the proposed anesthesia with the patient or authorized representative who has indicated his/her understanding and acceptance.     Plan Discussed with: CRNA  Anesthesia Plan Comments:         Anesthesia Quick Evaluation

## 2015-04-10 NOTE — H&P (Signed)
Reviewed paper H+P, will be scanned into chart. No changes noted.  

## 2015-05-19 ENCOUNTER — Other Ambulatory Visit: Payer: Medicare PPO

## 2015-05-22 ENCOUNTER — Ambulatory Visit (INDEPENDENT_AMBULATORY_CARE_PROVIDER_SITE_OTHER): Payer: Medicare PPO | Admitting: Urology

## 2015-05-22 ENCOUNTER — Encounter: Payer: Self-pay | Admitting: Urology

## 2015-05-22 VITALS — BP 151/77 | HR 93 | Ht 72.0 in | Wt 227.7 lb

## 2015-05-22 DIAGNOSIS — R972 Elevated prostate specific antigen [PSA]: Secondary | ICD-10-CM

## 2015-05-22 DIAGNOSIS — N138 Other obstructive and reflux uropathy: Secondary | ICD-10-CM

## 2015-05-22 DIAGNOSIS — N401 Enlarged prostate with lower urinary tract symptoms: Secondary | ICD-10-CM

## 2015-05-22 NOTE — Progress Notes (Signed)
10:48 AM   Brent Glass 04/23/1940 [id number]  Referring provider: Nat Christen, PA-C [address] [place], [place] [address]  No chief complaint on file.   HPI: Patient is 75 year old African-American male who presents today for his 6 month follow-up for BPH with LUTS and a history of elevated PSA.  BPH WITH LUTS His IPSS score today is 10, which is moderate lower urinary tract symptomatology. He is pleased with his quality life due to his urinary symptoms.  He denies any dysuria, hematuria or suprapubic pain.  He also denies any recent fevers, chills, nausea or vomiting.   He does not have a family history of PCa.     IPSS      05/22/15 1000       International Prostate Symptom Score   How often have you had the sensation of not emptying your bladder? Less than 1 in 5     How often have you had to urinate less than every two hours? Less than half the time     How often have you found you stopped and started again several times when you urinated? Less than 1 in 5 times     How often have you found it difficult to postpone urination? More than half the time     How often have you had a weak urinary stream? Less than 1 in 5 times     How often have you had to strain to start urination? Not at All     How many times did you typically get up at night to urinate? 1 Time     Total IPSS Score 10     Quality of Life due to urinary symptoms   If you were to spend the rest of your life with your urinary condition just the way it is now how would you feel about that? Pleased        Score:  1-7 Mild 8-19 Moderate 20-35 Severe  Elevated PSA Patient was found to have an elevated PSA of 4.9 ng/mL on 08/23/2011. A PSA, free at that time was 0.95 indicating a 20% probability of the patient having prostate cancer. His most recent PSA is 4.1 ng/mL on 11/19/2014.  We have continue to monitor his PSA every 6 months. It has continued a downward trend.  He has not undergone biopsy  and he has no family history of prostate cancer.   PMH: Past Medical History  Diagnosis Date  . Diabetes ([place])   . Low HDL (under 40)   . Knee pain   . Hypertension   . Disorder resulting from impaired renal function   . Obesity   . BPH with obstruction/lower urinary tract symptoms   . BPH (benign prostatic hyperplasia)   . Elevated PSA   . Urinary hesitancy   . Heart murmur   . Arthritis     Surgical History: Past Surgical History  Procedure Laterality Date  . Hernia repair  0000000    Umbilical Hernia Repair  . Knee arthroscopy Left 04/10/2015    Procedure: ARTHROSCOPY KNEE, partial medial meniscectomy, partial synovectomy;  Surgeon: Hessie Knows, MD;  Location: [facility] ORS;  Service: Orthopedics;  Laterality: Left;    Home Medications:    Medication List       This list is accurate as of: 05/22/15 10:48 AM.  Always use your most recent med list.               albuterol 108 (90  Base) MCG/ACT inhaler  Commonly known as:  PROVENTIL HFA;VENTOLIN HFA  Inhale 2 puffs into the lungs every 6 (six) hours as needed for wheezing or shortness of breath.     aspirin 81 MG tablet  Take 81 mg by mouth daily.     glipiZIDE 10 MG 24 hr tablet  Commonly known as:  GLUCOTROL XL  1 BY MOUTH DAILY FOR DIABETES     HYDROcodone-acetaminophen 5-325 MG tablet  Commonly known as:  NORCO  Take 1 tablet by mouth every 6 (six) hours as needed for moderate pain.     lisinopril-hydrochlorothiazide 20-25 MG tablet  Commonly known as:  PRINZIDE,ZESTORETIC  Take 1 tablet by mouth daily.     metFORMIN 1000 MG tablet  Commonly known as:  GLUCOPHAGE  Take 1,000 mg by mouth 2 (two) times daily with a meal.     simvastatin 40 MG tablet  Commonly known as:  ZOCOR  Take 40 mg by mouth daily.        Allergies: No Known Allergies  Family History: Family History  Problem Relation Age of Onset  . Diabetes Mother   . Kidney disease Neg Hx   . Prostate cancer Neg Hx     Social History:   reports that he has been smoking Cigarettes.  He has been smoking about 1.00 pack per day. He has never used smokeless tobacco. He reports that he does not drink alcohol or use illicit drugs.  ROS: UROLOGY Frequent Urination?: No Hard to postpone urination?: No Burning/pain with urination?: No Get up at night to urinate?: No Leakage of urine?: No Urine stream starts and stops?: No Trouble starting stream?: No Do you have to strain to urinate?: No Blood in urine?: No Urinary tract infection?: No Sexually transmitted disease?: No Injury to kidneys or bladder?: No Painful intercourse?: No Weak stream?: No Erection problems?: No Penile pain?: No  Gastrointestinal Nausea?: No Vomiting?: No Indigestion/heartburn?: No Diarrhea?: No Constipation?: No  Constitutional Fever: No Night sweats?: No Weight loss?: No Fatigue?: No  Skin Skin rash/lesions?: No Itching?: No  Eyes Blurred vision?: No Double vision?: No  Ears/Nose/Throat Sore throat?: No Sinus problems?: No  Hematologic/Lymphatic Swollen glands?: No Easy bruising?: No  Cardiovascular Leg swelling?: No Chest pain?: No  Respiratory Cough?: No Shortness of breath?: No  Endocrine Excessive thirst?: No  Musculoskeletal Back pain?: No Joint pain?: No  Neurological Headaches?: No Dizziness?: No  Psychologic Depression?: No Anxiety?: No  Physical Exam: BP 151/77 mmHg  Pulse 93  Ht 6' (1.829 m)  Wt 227 lb 11.2 oz (103.284 kg)  BMI 30.87 kg/m2  GU: Patient with uncircumcised phallus. Foreskin easily retracted  Urethral meatus is patent.  No penile discharge. No penile lesions or rashes. Scrotum without lesions, cysts, rashes and/or edema.  Testicles are located scrotally bilaterally. No masses are appreciated in the testicles. Left and right epididymis are normal. Rectal: Patient with  normal sphincter tone. Perineum without scarring or rashes. No rectal masses are appreciated. Prostate is  approximately 50 grams, no nodules are appreciated. Seminal vesicles are normal.    Laboratory Data: PSA History:  4.9 ng/mL on 08/23/2011  0.95 on 09/27/2011 PSA,free  3.4 ng/mL on 04/19/2012  3.5 ng/mL on 10/31/2012  3.0 ng/mL on 05/18/2013  3.6 ng/mL on 11/16/2013  4.1 ng/mL on 11/19/2014  Assessment & Plan:    1. BPH (benign prostatic hyperplasia) with LUTS:   Patient's IPSS score is 10/1.  His DRE demonstrates benign enlargement.  We will continue observation  over time with prn avoidance of alcohol/caffeine.  If PSA remains at baseline, he will follow up in 6 months for a PSA, DRE, PVR and an IPSS.    - PSA  2. Elevated PSA:   Patient was found to have an elevated PSA of 4.9 ng/mL on 08/23/2011. A PSA, free at that time was 0.95 indicating a 20% probability of the patient having prostate cancer.  We have continue to monitor his PSA every 6 months. It has continued a downward trend.  He has not undergone biopsy and he has no family history of prostate cancer.  Return in about 6 months (around 11/22/2015) for IPSS score and exam.  [name], [facility] [address] [place], [place] [address] [phone]

## 2015-05-23 LAB — PSA: Prostate Specific Ag, Serum: 5.9 ng/mL — ABNORMAL HIGH (ref 0.0–4.0)

## 2015-05-26 ENCOUNTER — Telehealth: Payer: Self-pay

## 2015-05-26 DIAGNOSIS — R972 Elevated prostate specific antigen [PSA]: Secondary | ICD-10-CM

## 2015-05-26 NOTE — Telephone Encounter (Signed)
-----   Message from Nori Riis, PA-C sent at 05/26/2015 12:45 PM EDT ----- PSA is higher.  I would like it repeated in one month.

## 2015-05-26 NOTE — Telephone Encounter (Signed)
Patient's daughter [name] notified and patient scheduled for 1 month blood draw, orders were placed/SW

## 2015-06-23 ENCOUNTER — Other Ambulatory Visit: Payer: Medicare PPO

## 2015-06-23 DIAGNOSIS — R972 Elevated prostate specific antigen [PSA]: Secondary | ICD-10-CM

## 2015-06-24 LAB — PSA: PROSTATE SPECIFIC AG, SERUM: 6.2 ng/mL — AB (ref 0.0–4.0)

## 2015-07-01 ENCOUNTER — Telehealth: Payer: Self-pay

## 2015-07-01 DIAGNOSIS — R972 Elevated prostate specific antigen [PSA]: Secondary | ICD-10-CM

## 2015-07-01 MED ORDER — FINASTERIDE 5 MG PO TABS: 5.0000 mg | ORAL_TABLET | Freq: Every day | ORAL | Status: DC

## 2015-07-01 NOTE — Telephone Encounter (Signed)
-----   Message from Nori Riis, PA-C sent at 06/29/2015  7:05 PM EDT ----- Patient's PSA continues to creep up.  Will like him to start finasteride 5 mg daily and recheck his PSA in 3 months.

## 2015-07-01 NOTE — Telephone Encounter (Signed)
Spoke with pt daughter, [name], in reference to PSA results and finasteride. [name] voiced understanding. Lab appt made. Medication sent to pt pharmacy.

## 2015-09-29 ENCOUNTER — Other Ambulatory Visit: Payer: Self-pay

## 2015-09-29 DIAGNOSIS — R972 Elevated prostate specific antigen [PSA]: Secondary | ICD-10-CM

## 2015-09-30 ENCOUNTER — Other Ambulatory Visit: Payer: Medicare PPO

## 2015-09-30 DIAGNOSIS — R972 Elevated prostate specific antigen [PSA]: Secondary | ICD-10-CM

## 2015-10-01 LAB — PSA: PROSTATE SPECIFIC AG, SERUM: 3.8 ng/mL (ref 0.0–4.0)

## 2015-10-03 ENCOUNTER — Other Ambulatory Visit: Payer: Self-pay | Admitting: Urology

## 2015-10-03 DIAGNOSIS — R972 Elevated prostate specific antigen [PSA]: Secondary | ICD-10-CM

## 2015-10-13 ENCOUNTER — Telehealth: Payer: Self-pay

## 2015-10-13 DIAGNOSIS — R972 Elevated prostate specific antigen [PSA]: Secondary | ICD-10-CM

## 2015-10-13 NOTE — Telephone Encounter (Signed)
Pt pharmacy sent a refill request of finasteride. Pt had labs as requested. Results in chart. Please advise.

## 2015-10-13 NOTE — Telephone Encounter (Signed)
He may refills to make it until his appointment in September.

## 2015-10-14 MED ORDER — FINASTERIDE 5 MG PO TABS: 5.0000 mg | ORAL_TABLET | Freq: Every day | ORAL | 1 refills | Status: DC

## 2015-10-15 ENCOUNTER — Other Ambulatory Visit: Payer: Self-pay

## 2015-10-15 DIAGNOSIS — R972 Elevated prostate specific antigen [PSA]: Secondary | ICD-10-CM

## 2015-10-15 MED ORDER — FINASTERIDE 5 MG PO TABS: 5.0000 mg | ORAL_TABLET | Freq: Every day | ORAL | 1 refills | Status: DC

## 2015-11-14 ENCOUNTER — Other Ambulatory Visit: Payer: Self-pay

## 2015-11-14 DIAGNOSIS — R972 Elevated prostate specific antigen [PSA]: Secondary | ICD-10-CM

## 2015-11-18 ENCOUNTER — Other Ambulatory Visit: Payer: Medicare PPO

## 2015-11-18 ENCOUNTER — Encounter: Payer: Self-pay | Admitting: Urology

## 2015-11-20 ENCOUNTER — Ambulatory Visit: Payer: Medicare PPO | Admitting: Urology

## 2015-11-20 ENCOUNTER — Encounter: Payer: Self-pay | Admitting: Urology

## 2015-12-16 ENCOUNTER — Ambulatory Visit: Payer: Medicare PPO | Admitting: Urology

## 2015-12-16 ENCOUNTER — Encounter: Payer: Self-pay | Admitting: Urology

## 2015-12-16 VITALS — BP 144/74 | HR 80 | Ht 71.0 in | Wt 230.8 lb

## 2015-12-16 DIAGNOSIS — N138 Other obstructive and reflux uropathy: Secondary | ICD-10-CM | POA: Diagnosis not present

## 2015-12-16 DIAGNOSIS — Z87898 Personal history of other specified conditions: Secondary | ICD-10-CM

## 2015-12-16 DIAGNOSIS — N401 Enlarged prostate with lower urinary tract symptoms: Secondary | ICD-10-CM | POA: Diagnosis not present

## 2015-12-16 NOTE — Progress Notes (Signed)
10:55 AM   Brent Glass 08-Mar-1941 [id number]  Referring provider: No referring provider defined for this encounter.  Chief Complaint  Patient presents with  . Benign Prostatic Hypertrophy    6 month follow up  . Elevated PSA    HPI: Patient is 75 year old African-American male who presents today for his 6 month follow-up for BPH with LUTS and a history of elevated PSA.  BPH WITH LUTS His IPSS score today is 1, which is mild lower urinary tract symptomatology. He is delighted with his quality life due to his urinary symptoms.  His previous IPSS score was 10/1.  His main complaint is nocturia 1, but this is not bothersome to him.  He denies any dysuria, hematuria or suprapubic pain.  He also denies any recent fevers, chills, nausea or vomiting.   He is currently on finasteride 5 mg daily.  He does not have a family history of PCa.     IPSS    Row Name 12/16/15 1000         International Prostate Symptom Score   How often have you had the sensation of not emptying your bladder? Not at All     How often have you had to urinate less than every two hours? Not at All     How often have you found you stopped and started again several times when you urinated? Not at All     How often have you found it difficult to postpone urination? Not at All     How often have you had a weak urinary stream? Not at All     How often have you had to strain to start urination? Not at All     How many times did you typically get up at night to urinate? 1 Time     Total IPSS Score 1       Quality of Life due to urinary symptoms   If you were to spend the rest of your life with your urinary condition just the way it is now how would you feel about that? Delighted        Score:  1-7 Mild 8-19 Moderate 20-35 Severe  History of elevated PSA Patient was found to have an elevated PSA of 4.9 ng/mL on 08/23/2011. A PSA, free at that time was 0.95 indicating a 20% probability of the patient having  prostate cancer. His most recent PSA is 3.8 ng/mL on 09/30/2015.   PMH: Past Medical History:  Diagnosis Date  . Arthritis   . BPH (benign prostatic hyperplasia)   . BPH with obstruction/lower urinary tract symptoms   . Diabetes ([place])   . Disorder resulting from impaired renal function   . Elevated PSA   . Heart murmur   . Hypertension   . Knee pain   . Low HDL (under 40)   . Obesity   . Urinary hesitancy     Surgical History: Past Surgical History:  Procedure Laterality Date  . HERNIA REPAIR  0000000   Umbilical Hernia Repair  . KNEE ARTHROSCOPY Left 04/10/2015   Procedure: ARTHROSCOPY KNEE, partial medial meniscectomy, partial synovectomy;  Surgeon: Hessie Knows, MD;  Location: [facility] ORS;  Service: Orthopedics;  Laterality: Left;    Home Medications:    Medication List       Accurate as of 12/16/15 10:55 AM. Always use your most recent med list.          albuterol 108 (90 Base) MCG/ACT inhaler Commonly known  as:  PROVENTIL HFA;VENTOLIN HFA Inhale 2 puffs into the lungs every 6 (six) hours as needed for wheezing or shortness of breath.   aspirin 81 MG tablet Take 81 mg by mouth daily.   finasteride 5 MG tablet Commonly known as:  PROSCAR Take 1 tablet (5 mg total) by mouth daily.   glipiZIDE 10 MG 24 hr tablet Commonly known as:  GLUCOTROL XL 1 BY MOUTH DAILY FOR DIABETES   HYDROcodone-acetaminophen 5-325 MG tablet Commonly known as:  NORCO Take 1 tablet by mouth every 6 (six) hours as needed for moderate pain.   lisinopril-hydrochlorothiazide 20-25 MG tablet Commonly known as:  PRINZIDE,ZESTORETIC Take 1 tablet by mouth daily.   metFORMIN 1000 MG tablet Commonly known as:  GLUCOPHAGE Take 1,000 mg by mouth 2 (two) times daily with a meal.   simvastatin 40 MG tablet Commonly known as:  ZOCOR Take 40 mg by mouth daily.       Allergies: No Known Allergies  Family History: Family History  Problem Relation Age of Onset  . Diabetes Mother   .  Kidney disease Neg Hx   . Prostate cancer Neg Hx     Social History:  reports that he has been smoking Cigarettes.  He has been smoking about 1.00 pack per day. He has never used smokeless tobacco. He reports that he does not drink alcohol or use drugs.  ROS: UROLOGY Frequent Urination?: No Hard to postpone urination?: No Burning/pain with urination?: No Get up at night to urinate?: Yes Leakage of urine?: No Urine stream starts and stops?: No Trouble starting stream?: No Do you have to strain to urinate?: No Blood in urine?: No Urinary tract infection?: No Sexually transmitted disease?: No Injury to kidneys or bladder?: No Painful intercourse?: No Weak stream?: No Erection problems?: No Penile pain?: No  Gastrointestinal Nausea?: No Vomiting?: No Indigestion/heartburn?: No Diarrhea?: No Constipation?: No  Constitutional Fever: No Night sweats?: No Weight loss?: No Fatigue?: No  Skin Skin rash/lesions?: No Itching?: No  Eyes Blurred vision?: No Double vision?: No  Ears/Nose/Throat Sore throat?: No Sinus problems?: No  Hematologic/Lymphatic Swollen glands?: No Easy bruising?: No  Cardiovascular Leg swelling?: No Chest pain?: No  Respiratory Cough?: No Shortness of breath?: No  Endocrine Excessive thirst?: No  Musculoskeletal Back pain?: No Joint pain?: No  Neurological Headaches?: No Dizziness?: No  Psychologic Depression?: No Anxiety?: No  Physical Exam: BP (!) 144/74   Pulse 80   Ht 5\' 11"  (1.803 m)   Wt 230 lb 12.8 oz (104.7 kg)   BMI 32.19 kg/m   GU: Patient with uncircumcised phallus. Foreskin easily retracted  Urethral meatus is patent.  No penile discharge. No penile lesions or rashes. Scrotum without lesions, cysts, rashes and/or edema.  Testicles are located scrotally bilaterally. No masses are appreciated in the testicles. Left and right epididymis are normal. Rectal: Patient with  normal sphincter tone. Perineum without  scarring or rashes. No rectal masses are appreciated. Prostate is approximately 50 grams, no nodules are appreciated. Seminal vesicles are normal.    Laboratory Data: PSA History:  4.9 ng/mL on 08/23/2011  0.95 on 09/27/2011 PSA,free  3.4 ng/mL on 04/19/2012  3.5 ng/mL on 10/31/2012  3.0 ng/mL on 05/18/2013  3.6 ng/mL on 11/16/2013  4.1 ng/mL on 11/19/2014  3.8 ng/mL on 09/30/2015  Assessment & Plan:    1. BPH with LUTS  - IPSS score is 1/0,  it is improving  - Continue conservative management, avoiding bladder irritants and timed voiding's  -  Continue finasteride 5 mg daily    - RTC in 12 months for IPSS and exam   2. History of elevated PSA  - Recent PSA was 3.6 ng/mL on 09/30/2015  - Screening no longer recommended at this time  - Will continue with yearly exams  Return in about 1 year (around 12/15/2016) for IPSS and exam.  [name], [facility]  [facility] [address] [place], [place] [address] [phone]

## 2016-05-10 ENCOUNTER — Other Ambulatory Visit: Payer: Self-pay | Admitting: Urology

## 2016-05-10 DIAGNOSIS — R972 Elevated prostate specific antigen [PSA]: Secondary | ICD-10-CM

## 2016-08-26 ENCOUNTER — Other Ambulatory Visit: Payer: Self-pay | Admitting: Urology

## 2016-08-26 DIAGNOSIS — R972 Elevated prostate specific antigen [PSA]: Secondary | ICD-10-CM

## 2016-11-26 ENCOUNTER — Other Ambulatory Visit: Payer: Self-pay | Admitting: Urology

## 2016-11-26 DIAGNOSIS — R972 Elevated prostate specific antigen [PSA]: Secondary | ICD-10-CM

## 2016-12-14 NOTE — Progress Notes (Deleted)
1:10 PM   Brent Glass Jul 08, 1940 [id number]  Referring provider: No referring provider defined for this encounter.  No chief complaint on file.   HPI: Patient is 76 year old African-American male who presents today for his 6 month follow-up for BPH with LUTS and a history of elevated PSA.  BPH WITH LUTS His IPSS score today is ***, which is *** lower urinary tract symptomatology. He is delighted with his quality life due to his urinary symptoms.  His previous IPSS score was 1/0.  His main complaint is nocturia 1, but this is not bothersome to him.  He denies any dysuria, hematuria or suprapubic pain.  He also denies any recent fevers, chills, nausea or vomiting.   He is currently on finasteride 5 mg daily.  He does not have a family history of PCa.   Score:  1-7 Mild 8-19 Moderate 20-35 Severe  History of elevated PSA Patient was found to have an elevated PSA of 4.9 ng/mL on 08/23/2011. A PSA, free at that time was 0.95 indicating a 20% probability of the patient having prostate cancer. His most recent PSA is 3.8 ng/mL on 09/30/2015.   PMH: Past Medical History:  Diagnosis Date  . Arthritis   . BPH (benign prostatic hyperplasia)   . BPH with obstruction/lower urinary tract symptoms   . Diabetes ([place])   . Disorder resulting from impaired renal function   . Elevated PSA   . Heart murmur   . Hypertension   . Knee pain   . Low HDL (under 40)   . Obesity   . Urinary hesitancy     Surgical History: Past Surgical History:  Procedure Laterality Date  . HERNIA REPAIR  3785   Umbilical Hernia Repair  . KNEE ARTHROSCOPY Left 04/10/2015   Procedure: ARTHROSCOPY KNEE, partial medial meniscectomy, partial synovectomy;  Surgeon: Brent Knows, MD;  Location: [facility] ORS;  Service: Orthopedics;  Laterality: Left;    Home Medications:  Allergies as of 12/15/2016   No Known Allergies     Medication List       Accurate as of 12/14/16  1:10 PM. Always use your most recent  med list.          albuterol 108 (90 Base) MCG/ACT inhaler Commonly known as:  PROVENTIL HFA;VENTOLIN HFA Inhale 2 puffs into the lungs every 6 (six) hours as needed for wheezing or shortness of breath.   aspirin 81 MG tablet Take 81 mg by mouth daily.   finasteride 5 MG tablet Commonly known as:  PROSCAR TAKE 1 TABLET (5 MG TOTAL) BY MOUTH DAILY.   glipiZIDE 10 MG 24 hr tablet Commonly known as:  GLUCOTROL XL 1 BY MOUTH DAILY FOR DIABETES   HYDROcodone-acetaminophen 5-325 MG tablet Commonly known as:  NORCO Take 1 tablet by mouth every 6 (six) hours as needed for moderate pain.   lisinopril-hydrochlorothiazide 20-25 MG tablet Commonly known as:  PRINZIDE,ZESTORETIC Take 1 tablet by mouth daily.   metFORMIN 1000 MG tablet Commonly known as:  GLUCOPHAGE Take 1,000 mg by mouth 2 (two) times daily with a meal.   simvastatin 40 MG tablet Commonly known as:  ZOCOR Take 40 mg by mouth daily.       Allergies: No Known Allergies  Family History: Family History  Problem Relation Age of Onset  . Diabetes Mother   . Kidney disease Neg Hx   . Prostate cancer Neg Hx     Social History:  reports that he has been smoking Cigarettes.  He has been smoking about 1.00 pack per day. He has never used smokeless tobacco. He reports that he does not drink alcohol or use drugs.  ROS:                                        Physical Exam: There were no vitals taken for this visit.  GU: Patient with uncircumcised phallus. Foreskin easily retracted  Urethral meatus is patent.  No penile discharge. No penile lesions or rashes. Scrotum without lesions, cysts, rashes and/or edema.  Testicles are located scrotally bilaterally. No masses are appreciated in the testicles. Left and right epididymis are normal. Rectal: Patient with  normal sphincter tone. Perineum without scarring or rashes. No rectal masses are appreciated. Prostate is approximately 50 grams, no nodules  are appreciated. Seminal vesicles are normal.    Laboratory Data: PSA History:  4.9 ng/mL on 08/23/2011  0.95 on 09/27/2011 PSA,free  3.4 ng/mL on 04/19/2012  3.5 ng/mL on 10/31/2012  3.0 ng/mL on 05/18/2013  3.6 ng/mL on 11/16/2013  4.1 ng/mL on 11/19/2014  3.8 ng/mL on 09/30/2015  Assessment & Plan:    1. BPH with LUTS  - IPSS score is 1/0,  it is improving  - Continue conservative management, avoiding bladder irritants and timed voiding's  - Continue finasteride 5 mg daily    - RTC in 12 months for IPSS and exam   2. History of elevated PSA  - Recent PSA was 3.6 ng/mL on 09/30/2015  - Screening no longer recommended at this time  - Will continue with yearly exams  No Follow-up on file.  Brent Glass, [facility] [address] [place], [place] [address] [phone]

## 2016-12-15 ENCOUNTER — Ambulatory Visit: Payer: Medicare PPO | Admitting: Urology

## 2016-12-15 ENCOUNTER — Encounter: Payer: Self-pay | Admitting: Urology

## 2017-01-03 ENCOUNTER — Ambulatory Visit: Payer: Medicare PPO | Admitting: Urology

## 2017-01-03 NOTE — Progress Notes (Signed)
11:31 AM   Brent Glass April 06, 1940 [id number]  Referring provider: No referring provider defined for this encounter.  Chief Complaint  Patient presents with  . Benign Prostatic Hypertrophy  . Elevated PSA  . Follow-up    HPI: Patient is 76 year old African-American male who presents today for his 1 year follow-up for BPH with LUTS and a history of elevated PSA.  BPH WITH LUTS His IPSS score today is 4, which is mild lower urinary tract symptomatology.  He is pleased with his quality life due to his urinary symptoms.  His previous IPSS score was 1/0.  His main complaint is nocturia 1, but this is not bothersome to him.  He denies any dysuria, hematuria or suprapubic pain.  He also denies any recent fevers, chills, nausea or vomiting.   He is currently on finasteride 5 mg daily.  He does not have a family history of PCa.     IPSS    Row Name 01/04/17 1100         International Prostate Symptom Score   How often have you had the sensation of not emptying your bladder? Not at All     How often have you had to urinate less than every two hours? Less than half the time     How often have you found you stopped and started again several times when you urinated? Not at All     How often have you found it difficult to postpone urination? Less than 1 in 5 times     How often have you had a weak urinary stream? Less than 1 in 5 times     How often have you had to strain to start urination? Not at All     How many times did you typically get up at night to urinate? None     Total IPSS Score 4       Quality of Life due to urinary symptoms   If you were to spend the rest of your life with your urinary condition just the way it is now how would you feel about that? Pleased        Score:  1-7 Mild 8-19 Moderate 20-35 Severe  History of elevated PSA Patient was found to have an elevated PSA of 4.9 ng/mL on 08/23/2011. A PSA, free at that time was 0.95 indicating a 20% probability  of the patient having prostate cancer. His most recent PSA is 3.8 ng/mL on 09/30/2015.  PSA is drawn today.     PMH: Past Medical History:  Diagnosis Date  . Arthritis   . BPH (benign prostatic hyperplasia)   . BPH with obstruction/lower urinary tract symptoms   . Diabetes ([place])   . Disorder resulting from impaired renal function   . Elevated PSA   . Heart murmur   . Hypertension   . Knee pain   . Low HDL (under 40)   . Obesity   . Urinary hesitancy     Surgical History: Past Surgical History:  Procedure Laterality Date  . HERNIA REPAIR  4944   Umbilical Hernia Repair  . KNEE ARTHROSCOPY Left 04/10/2015   Procedure: ARTHROSCOPY KNEE, partial medial meniscectomy, partial synovectomy;  Surgeon: Hessie Knows, MD;  Location: [facility] ORS;  Service: Orthopedics;  Laterality: Left;    Home Medications:  Allergies as of 01/04/2017   No Known Allergies     Medication List       Accurate as of 01/04/17 11:31 AM. Always use  your most recent med list.          albuterol 108 (90 Base) MCG/ACT inhaler Commonly known as:  PROVENTIL HFA;VENTOLIN HFA Inhale 2 puffs into the lungs every 6 (six) hours as needed for wheezing or shortness of breath.   aspirin 81 MG tablet Take 81 mg by mouth daily.   finasteride 5 MG tablet Commonly known as:  PROSCAR Take 1 tablet (5 mg total) by mouth daily.   glipiZIDE 10 MG 24 hr tablet Commonly known as:  GLUCOTROL XL 1 BY MOUTH DAILY FOR DIABETES   HYDROcodone-acetaminophen 5-325 MG tablet Commonly known as:  NORCO Take 1 tablet by mouth every 6 (six) hours as needed for moderate pain.   lisinopril-hydrochlorothiazide 20-25 MG tablet Commonly known as:  PRINZIDE,ZESTORETIC Take 1 tablet by mouth daily.   metFORMIN 1000 MG tablet Commonly known as:  GLUCOPHAGE Take 1,000 mg by mouth 2 (two) times daily with a meal.   simvastatin 40 MG tablet Commonly known as:  ZOCOR Take 40 mg by mouth daily.       Allergies: No Known  Allergies  Family History: Family History  Problem Relation Age of Onset  . Diabetes Mother   . Kidney disease Neg Hx   . Prostate cancer Neg Hx     Social History:  reports that he has been smoking Cigarettes.  He has been smoking about 1.00 pack per day. He has never used smokeless tobacco. He reports that he does not drink alcohol or use drugs.  ROS: UROLOGY Frequent Urination?: No Hard to postpone urination?: No Burning/pain with urination?: No Get up at night to urinate?: No Leakage of urine?: No Urine stream starts and stops?: No Trouble starting stream?: No Do you have to strain to urinate?: No Blood in urine?: No Urinary tract infection?: No Sexually transmitted disease?: No Injury to kidneys or bladder?: No Painful intercourse?: No Weak stream?: No Erection problems?: No Penile pain?: No  Gastrointestinal Nausea?: No Vomiting?: No Indigestion/heartburn?: No Diarrhea?: No Constipation?: No  Constitutional Fever: No Night sweats?: No Weight loss?: No Fatigue?: No  Skin Skin rash/lesions?: No Itching?: No  Eyes Blurred vision?: No Double vision?: No  Ears/Nose/Throat Sore throat?: No Sinus problems?: No  Hematologic/Lymphatic Swollen glands?: No Easy bruising?: No  Cardiovascular Leg swelling?: No Chest pain?: No  Respiratory Cough?: No Shortness of breath?: No  Endocrine Excessive thirst?: No  Musculoskeletal Back pain?: No Joint pain?: No  Neurological Headaches?: No Dizziness?: No  Psychologic Depression?: No Anxiety?: No  Physical Exam: BP (!) 166/80   Pulse 84   Ht 5\' 11"  (1.803 m)   Wt 236 lb (107 kg)   BMI 32.92 kg/m   GU: Patient with uncircumcised phallus. Foreskin easily retracted  Urethral meatus is patent.  No penile discharge. No penile lesions or rashes. Scrotum without lesions, cysts, rashes and/or edema.  Testicles are located scrotally bilaterally. No masses are appreciated in the testicles. Left and  right epididymis are normal. Rectal: Patient with  normal sphincter tone. Perineum without scarring or rashes. No rectal masses are appreciated. Prostate is approximately 50 grams, no nodules are appreciated. Seminal vesicles are normal.    Laboratory Data: PSA History:  4.9 ng/mL on 08/23/2011  0.95 on 09/27/2011 PSA,free  3.4 ng/mL on 04/19/2012  3.5 ng/mL on 10/31/2012  3.0 ng/mL on 05/18/2013  3.6 ng/mL on 11/16/2013  4.1 ng/mL on 11/19/2014  3.8 ng/mL on 09/30/2015  Assessment & Plan:    1. BPH with LUTS  -  IPSS score is 4/1,  it is slightly worse  - Continue conservative management, avoiding bladder irritants and timed voiding's  - Continue finasteride 5 mg daily; refill sent to pharmacy    - RTC in 12 months for IPSS and exam   2. History of elevated PSA  - Recent PSA was 3.6 ng/mL on 09/30/2015  - PSA is drawn today  Return in about 1 year (around 01/04/2018) for IPSS, PSA and exam.  [name], [facility]  [place] [address] [place], [place] [address] [phone]

## 2017-01-04 ENCOUNTER — Encounter: Payer: Self-pay | Admitting: Urology

## 2017-01-04 ENCOUNTER — Ambulatory Visit (INDEPENDENT_AMBULATORY_CARE_PROVIDER_SITE_OTHER): Payer: Medicare PPO | Admitting: Urology

## 2017-01-04 VITALS — BP 166/80 | HR 84 | Ht 71.0 in | Wt 236.0 lb

## 2017-01-04 DIAGNOSIS — Z87898 Personal history of other specified conditions: Secondary | ICD-10-CM

## 2017-01-04 DIAGNOSIS — N138 Other obstructive and reflux uropathy: Secondary | ICD-10-CM

## 2017-01-04 DIAGNOSIS — R972 Elevated prostate specific antigen [PSA]: Secondary | ICD-10-CM

## 2017-01-04 DIAGNOSIS — N401 Enlarged prostate with lower urinary tract symptoms: Secondary | ICD-10-CM

## 2017-01-04 MED ORDER — FINASTERIDE 5 MG PO TABS: 5.0000 mg | ORAL_TABLET | Freq: Every day | ORAL | 3 refills | Status: DC

## 2017-01-05 LAB — PSA: Prostate Specific Ag, Serum: 4 ng/mL (ref 0.0–4.0)

## 2017-01-19 ENCOUNTER — Telehealth: Payer: Self-pay | Admitting: Family Medicine

## 2017-01-19 DIAGNOSIS — Z87898 Personal history of other specified conditions: Secondary | ICD-10-CM

## 2017-01-19 NOTE — Telephone Encounter (Signed)
-----   Message from Nori Riis, PA-C sent at 01/18/2017  9:42 PM EST ----- Please let Mr. Coster know that his PSA has risen slightly.  I would like it repeated in 3 months.

## 2017-01-19 NOTE — Telephone Encounter (Signed)
Unable to leave message, Voice mail not set up

## 2017-01-20 NOTE — Telephone Encounter (Signed)
Spoke with pt daughter, [name], in reference to PSA rising slightly and needing to check again in 32mo. [name] voiced understanding. Lab appt made and orders placed.

## 2017-04-22 ENCOUNTER — Encounter: Payer: Self-pay | Admitting: Urology

## 2017-04-22 ENCOUNTER — Other Ambulatory Visit: Payer: Self-pay

## 2017-05-05 ENCOUNTER — Other Ambulatory Visit: Payer: Medicare PPO

## 2017-05-05 DIAGNOSIS — Z87898 Personal history of other specified conditions: Secondary | ICD-10-CM

## 2017-05-06 ENCOUNTER — Telehealth: Payer: Self-pay

## 2017-05-06 LAB — PSA: Prostate Specific Ag, Serum: 2.3 ng/mL (ref 0.0–4.0)

## 2017-05-06 NOTE — Telephone Encounter (Signed)
-----   Message from Nori Riis, PA-C sent at 05/06/2017  7:58 AM EST ----- Please let patient know that his PSA has come down.  He needs to continue the finasteride and we will repeat his PSA in 6 months.

## 2017-05-06 NOTE — Telephone Encounter (Signed)
Letter sent.

## 2017-05-07 ENCOUNTER — Other Ambulatory Visit: Payer: Self-pay

## 2017-05-07 ENCOUNTER — Emergency Department
Admission: EM | Admit: 2017-05-07 | Discharge: 2017-05-07 | Disposition: A | Discharge status: Home / Self Care | Payer: Medicare PPO | Attending: Emergency Medicine | Admitting: Emergency Medicine

## 2017-05-07 ENCOUNTER — Emergency Department: Payer: Medicare PPO

## 2017-05-07 DIAGNOSIS — S46911A Strain of unspecified muscle, fascia and tendon at shoulder and upper arm level, right arm, initial encounter: Secondary | ICD-10-CM | POA: Diagnosis not present

## 2017-05-07 DIAGNOSIS — Z7982 Long term (current) use of aspirin: Secondary | ICD-10-CM | POA: Diagnosis not present

## 2017-05-07 DIAGNOSIS — Z79899 Other long term (current) drug therapy: Secondary | ICD-10-CM | POA: Insufficient documentation

## 2017-05-07 DIAGNOSIS — E119 Type 2 diabetes mellitus without complications: Secondary | ICD-10-CM | POA: Insufficient documentation

## 2017-05-07 DIAGNOSIS — Y939 Activity, unspecified: Secondary | ICD-10-CM | POA: Diagnosis not present

## 2017-05-07 DIAGNOSIS — Y929 Unspecified place or not applicable: Secondary | ICD-10-CM | POA: Insufficient documentation

## 2017-05-07 DIAGNOSIS — Z7984 Long term (current) use of oral hypoglycemic drugs: Secondary | ICD-10-CM | POA: Diagnosis not present

## 2017-05-07 DIAGNOSIS — W01190A Fall on same level from slipping, tripping and stumbling with subsequent striking against furniture, initial encounter: Secondary | ICD-10-CM | POA: Insufficient documentation

## 2017-05-07 DIAGNOSIS — I10 Essential (primary) hypertension: Secondary | ICD-10-CM | POA: Insufficient documentation

## 2017-05-07 DIAGNOSIS — Y999 Unspecified external cause status: Secondary | ICD-10-CM | POA: Insufficient documentation

## 2017-05-07 DIAGNOSIS — S4991XA Unspecified injury of right shoulder and upper arm, initial encounter: Secondary | ICD-10-CM | POA: Diagnosis present

## 2017-05-07 DIAGNOSIS — F17210 Nicotine dependence, cigarettes, uncomplicated: Secondary | ICD-10-CM | POA: Insufficient documentation

## 2017-05-07 LAB — GLUCOSE, CAPILLARY: GLUCOSE-CAPILLARY: 257 mg/dL — AB (ref 65–99)

## 2017-05-07 MED ORDER — MELOXICAM 15 MG PO TABS: 15.0000 mg | ORAL_TABLET | Freq: Every day | ORAL | 0 refills | Status: DC

## 2017-05-07 NOTE — ED Notes (Signed)
NAD noted at time of D/C. Pt denies questions or concerns. Pt ambulatory to the lobby at this time.  

## 2017-05-07 NOTE — ED Notes (Signed)
Pt reports falling at approx 11am onto R shoulder.  Pt reports that "I just fell".  Pt doesn't know if he got dizzy or if he had a mechanical fall.  Pt denies hitting head.  Pt is A&Ox4, in NAD.

## 2017-05-07 NOTE — ED Provider Notes (Signed)
[facility] Emergency Department Provider Note ____________________________________________  Time seen: Approximately 2:44 PM  I have reviewed the triage vital signs and the nursing notes.   HISTORY  Chief Complaint Shoulder Pain    HPI Brent Glass is a 77 y.o. male who presents to the emergency department for evaluation and treatment of right shoulder pain.  Patient states that he ran into the coffee table and fell into it.  He states that his shoulder hit the edge of the table.  Incident occurred approximately 11 AM today.  He denies striking his head.  He denies loss of consciousness.  He has been able to move the shoulder somewhat, but is unable to lift it above his head.  He denies previous shoulder dislocation or fracture.  He has no pain in the remainder of the right upper extremity.  He also denies neck, hip or back pain.  He denies history of osteoporosis.  Past Medical History:  Diagnosis Date  . Arthritis   . BPH (benign prostatic hyperplasia)   . BPH with obstruction/lower urinary tract symptoms   . Diabetes ([place])   . Disorder resulting from impaired renal function   . Elevated PSA   . Heart murmur   . Hypertension   . Knee pain   . Low HDL (under 40)   . Obesity   . Urinary hesitancy     Patient Active Problem List   Diagnosis Date Noted  . BPH with obstruction/lower urinary tract symptoms 11/21/2014  . Elevated PSA 11/21/2014    Past Surgical History:  Procedure Laterality Date  . HERNIA REPAIR  4481   Umbilical Hernia Repair  . KNEE ARTHROSCOPY Left 04/10/2015   Procedure: ARTHROSCOPY KNEE, partial medial meniscectomy, partial synovectomy;  Surgeon: Hessie Knows, MD;  Location: [facility] ORS;  Service: Orthopedics;  Laterality: Left;    Prior to Admission medications   Medication Sig Start Date End Date Taking? Authorizing Provider  albuterol (PROVENTIL HFA;VENTOLIN HFA) 108 (90 BASE) MCG/ACT inhaler Inhale 2 puffs into the lungs  every 6 (six) hours as needed for wheezing or shortness of breath.    [provider]  aspirin 81 MG tablet Take 81 mg by mouth daily.    [provider]  finasteride (PROSCAR) 5 MG tablet Take 1 tablet (5 mg total) by mouth daily. 01/04/17   [name] A, PA-C  glipiZIDE (GLUCOTROL XL) 10 MG 24 hr tablet 1 BY MOUTH DAILY FOR DIABETES 11/09/14   [provider]  HYDROcodone-acetaminophen (NORCO) 5-325 MG tablet Take 1 tablet by mouth every 6 (six) hours as needed for moderate pain. Patient not taking: Reported on 12/16/2015 04/10/15   Hessie Knows, MD  lisinopril-hydrochlorothiazide (PRINZIDE,ZESTORETIC) 20-25 MG per tablet Take 1 tablet by mouth daily.    [provider]  meloxicam (MOBIC) 15 MG tablet Take 1 tablet (15 mg total) by mouth daily. 05/07/17   Britten Parady, Johnette Abraham B, FNP  metFORMIN (GLUCOPHAGE) 1000 MG tablet Take 1,000 mg by mouth 2 (two) times daily with a meal.    [provider]  simvastatin (ZOCOR) 40 MG tablet Take 40 mg by mouth daily.    [provider]    Allergies Patient has no known allergies.  Family History  Problem Relation Age of Onset  . Diabetes Mother   . Kidney disease Neg Hx   . Prostate cancer Neg Hx     Social History Social History   Tobacco Use  . Smoking status: Current Every Day Smoker  Packs/day: 1.00    Types: Cigarettes  . Smokeless tobacco: Never Used  Substance Use Topics  . Alcohol use: No    Alcohol/week: 0.0 oz  . Drug use: No    Review of Systems Constitutional: Negative for fever. Cardiovascular: Negative for chest pain. Respiratory: Negative for shortness of breath. Musculoskeletal: Positive for right shoulder pain. Skin: Negative for open wound or lesion Neurological: Negative for decrease in sensation  ____________________________________________   PHYSICAL EXAM:  VITAL SIGNS: ED Triage Vitals  Enc Vitals Group     BP 05/07/17 1318 134/66     Pulse Rate  05/07/17 1318 76     Resp 05/07/17 1318 18     Temp 05/07/17 1318 98.2 F (36.8 C)     Temp Source 05/07/17 1318 Oral     SpO2 05/07/17 1318 96 %     Weight 05/07/17 1320 224 lb (101.6 kg)     Height 05/07/17 1320 6' (1.829 m)     Head Circumference --      Peak Flow --      Pain Score 05/07/17 1320 8     Pain Loc --      Pain Edu? --      Excl. in [place]? --     Constitutional: Alert and oriented. Well appearing and in no acute distress. Eyes: Conjunctivae are clear without discharge or drainage Head: Atraumatic Neck: Supple.  No midline tenderness. Respiratory: No cough. Respirations are even and unlabored. Musculoskeletal: Patient is able to complete range of motion of the right shoulder with the exception of elevation above midline.  There is focal tenderness on the superior aspect of the humeral head.  No deformity is noted.  Neurologic: Awake, alert, oriented x4. Skin: Intact Psychiatric: Affect and behavior are appropriate.  ____________________________________________   LABS (all labs ordered are listed, but only abnormal results are displayed)  Labs Reviewed  GLUCOSE, CAPILLARY - Abnormal; Notable for the following components:      Result Value   Glucose-Capillary 257 (*)    All other components within normal limits   ____________________________________________  RADIOLOGY  Image of the right shoulder does not indicate any acute bony abnormality per radiology. ____________________________________________   PROCEDURES  Procedures  ____________________________________________   INITIAL IMPRESSION / ASSESSMENT AND PLAN / ED COURSE  Brent Glass is a 77 y.o. who presents to the emergency department for treatment and evaluation of right shoulder pain after a non-syncopal fall earlier today.  Patient's arm was placed in a sling and he was encouraged to rest, ice, and take the medications as prescribed.  He was instructed to follow-up with his orthopedist if not  improving over the week.  He was instructed to return to the emergency department for symptoms of change or worsen if he is unable to schedule an appointment.  Medications - No data to display  Pertinent labs & imaging results that were available during my care of the patient were reviewed by me and considered in my medical decision making (see chart for details).  _________________________________________   FINAL CLINICAL IMPRESSION(S) / ED DIAGNOSES  Final diagnoses:  Strain of right shoulder, initial encounter    ED Discharge Orders        Ordered    meloxicam (MOBIC) 15 MG tablet  Daily     05/07/17 1456       If controlled substance prescribed during this visit, 12 month history viewed on the [place] prior to issuing an initial prescription for Schedule II or  III opiod.    Victorino Dike, FNP 05/07/17 1551    Orbie Pyo, MD 05/08/17 [phone]

## 2017-05-07 NOTE — ED Triage Notes (Signed)
Pt states fall today on R shoulder. Landed on coffee table. Denies feeling dizzy. Has been able to move arm since falling. Alert, oriented, ambulatory. Denies injury to elbow, wrist, or hand. Just shoulder pain.

## 2017-11-06 ENCOUNTER — Other Ambulatory Visit: Payer: Self-pay | Admitting: Urology

## 2017-11-06 DIAGNOSIS — R972 Elevated prostate specific antigen [PSA]: Secondary | ICD-10-CM

## 2017-12-23 ENCOUNTER — Other Ambulatory Visit: Payer: Self-pay | Admitting: Family Medicine

## 2017-12-23 DIAGNOSIS — Z87898 Personal history of other specified conditions: Secondary | ICD-10-CM

## 2017-12-27 ENCOUNTER — Other Ambulatory Visit: Payer: Medicare PPO

## 2017-12-27 ENCOUNTER — Encounter: Payer: Self-pay | Admitting: Urology

## 2018-01-03 ENCOUNTER — Encounter: Payer: Self-pay | Admitting: Urology

## 2018-01-03 ENCOUNTER — Ambulatory Visit: Payer: Medicare PPO | Admitting: Urology

## 2018-11-25 ENCOUNTER — Other Ambulatory Visit: Payer: Self-pay | Admitting: Urology

## 2018-11-25 DIAGNOSIS — R972 Elevated prostate specific antigen [PSA]: Secondary | ICD-10-CM

## 2019-01-09 ENCOUNTER — Other Ambulatory Visit: Payer: Self-pay | Admitting: Urology

## 2019-01-09 DIAGNOSIS — R972 Elevated prostate specific antigen [PSA]: Secondary | ICD-10-CM

## 2019-01-26 ENCOUNTER — Other Ambulatory Visit: Payer: Self-pay | Admitting: Urology

## 2019-01-26 DIAGNOSIS — R972 Elevated prostate specific antigen [PSA]: Secondary | ICD-10-CM

## 2020-01-16 ENCOUNTER — Emergency Department: Payer: Medicare Other

## 2020-01-16 ENCOUNTER — Emergency Department
Admission: EM | Admit: 2020-01-16 | Discharge: 2020-01-17 | Disposition: A | Discharge status: Home / Self Care | Payer: Medicare Other | Attending: Emergency Medicine | Admitting: Emergency Medicine

## 2020-01-16 ENCOUNTER — Other Ambulatory Visit: Payer: Self-pay

## 2020-01-16 ENCOUNTER — Encounter: Payer: Self-pay | Admitting: Emergency Medicine

## 2020-01-16 DIAGNOSIS — Z79899 Other long term (current) drug therapy: Secondary | ICD-10-CM | POA: Diagnosis not present

## 2020-01-16 DIAGNOSIS — Z7982 Long term (current) use of aspirin: Secondary | ICD-10-CM | POA: Insufficient documentation

## 2020-01-16 DIAGNOSIS — E1129 Type 2 diabetes mellitus with other diabetic kidney complication: Secondary | ICD-10-CM | POA: Insufficient documentation

## 2020-01-16 DIAGNOSIS — E785 Hyperlipidemia, unspecified: Secondary | ICD-10-CM | POA: Insufficient documentation

## 2020-01-16 DIAGNOSIS — Z7984 Long term (current) use of oral hypoglycemic drugs: Secondary | ICD-10-CM | POA: Diagnosis not present

## 2020-01-16 DIAGNOSIS — F17210 Nicotine dependence, cigarettes, uncomplicated: Secondary | ICD-10-CM | POA: Diagnosis not present

## 2020-01-16 DIAGNOSIS — I10 Essential (primary) hypertension: Secondary | ICD-10-CM | POA: Insufficient documentation

## 2020-01-16 DIAGNOSIS — R079 Chest pain, unspecified: Secondary | ICD-10-CM | POA: Diagnosis present

## 2020-01-16 DIAGNOSIS — R918 Other nonspecific abnormal finding of lung field: Secondary | ICD-10-CM | POA: Diagnosis not present

## 2020-01-16 DIAGNOSIS — E1169 Type 2 diabetes mellitus with other specified complication: Secondary | ICD-10-CM | POA: Insufficient documentation

## 2020-01-16 LAB — COMPREHENSIVE METABOLIC PANEL WITH GFR
ALT: 12 U/L (ref 0–44)
AST: 13 U/L — ABNORMAL LOW (ref 15–41)
Albumin: 3.8 g/dL (ref 3.5–5.0)
Alkaline Phosphatase: 120 U/L (ref 38–126)
Anion gap: 13 (ref 5–15)
BUN: 16 mg/dL (ref 8–23)
CO2: 22 mmol/L (ref 22–32)
Calcium: 9.4 mg/dL (ref 8.9–10.3)
Chloride: 103 mmol/L (ref 98–111)
Creatinine, Ser: 1.23 mg/dL (ref 0.61–1.24)
GFR, Estimated: 59 mL/min — ABNORMAL LOW
Glucose, Bld: 97 mg/dL (ref 70–99)
Potassium: 3.9 mmol/L (ref 3.5–5.1)
Sodium: 138 mmol/L (ref 135–145)
Total Bilirubin: 0.7 mg/dL (ref 0.3–1.2)
Total Protein: 7.6 g/dL (ref 6.5–8.1)

## 2020-01-16 LAB — CBC WITH DIFFERENTIAL/PLATELET
Abs Immature Granulocytes: 0.05 10*3/uL (ref 0.00–0.07)
Basophils Absolute: 0.1 10*3/uL (ref 0.0–0.1)
Basophils Relative: 1 %
Eosinophils Absolute: 0.2 10*3/uL (ref 0.0–0.5)
Eosinophils Relative: 2 %
HCT: 41.2 % (ref 39.0–52.0)
Hemoglobin: 13.5 g/dL (ref 13.0–17.0)
Immature Granulocytes: 0 %
Lymphocytes Relative: 25 %
Lymphs Abs: 3.3 10*3/uL (ref 0.7–4.0)
MCH: 28.7 pg (ref 26.0–34.0)
MCHC: 32.8 g/dL (ref 30.0–36.0)
MCV: 87.5 fL (ref 80.0–100.0)
Monocytes Absolute: 1 10*3/uL (ref 0.1–1.0)
Monocytes Relative: 7 %
Neutro Abs: 8.8 10*3/uL — ABNORMAL HIGH (ref 1.7–7.7)
Neutrophils Relative %: 65 %
Platelets: 421 10*3/uL — ABNORMAL HIGH (ref 150–400)
RBC: 4.71 MIL/uL (ref 4.22–5.81)
RDW: 14.6 % (ref 11.5–15.5)
WBC: 13.5 10*3/uL — ABNORMAL HIGH (ref 4.0–10.5)
nRBC: 0 % (ref 0.0–0.2)

## 2020-01-16 LAB — TROPONIN I (HIGH SENSITIVITY)
Troponin I (High Sensitivity): 7 ng/L (ref <18)
Troponin I (High Sensitivity): 6 ng/L (ref <18)

## 2020-01-16 NOTE — ED Triage Notes (Addendum)
Patient ambulatory to triage with steady gait, without difficulty or distress noted; pt reports "discomfort" to mid upper chest radiating at times to rt side since Sunday; denies accomp symptoms; denies hx of same

## 2020-01-17 ENCOUNTER — Encounter: Payer: Self-pay | Admitting: Radiology

## 2020-01-17 ENCOUNTER — Emergency Department: Payer: Medicare Other

## 2020-01-17 MED ORDER — IOHEXOL 350 MG/ML SOLN
75.0000 mL | Freq: Once | INTRAVENOUS | Status: AC | PRN
  Administered 2020-01-17: 75 mL via INTRAVENOUS

## 2020-01-17 NOTE — ED Provider Notes (Signed)
[facility] Emergency Department Provider Note  ____________________________________________   First MD Initiated Contact with Patient 01/16/20 2331     (approximate)  I have reviewed the triage vital signs and the nursing notes.   HISTORY  Chief Complaint No chief complaint on file.    HPI Brent Glass is a 79 y.o. male with below list of previous medical conditions including hypertension diabetes hyperlipidemia, 1 pack/day cigarette use presents to the emergency department secondary to intermittent central chest pain with radiation to the right shoulder which patient states has been occurring since Sunday.  Patient states that current pain score is 3 out of 10.  Patient denies any aggravating or alleviating factors for his pain.  Patient denies any lower extremity pain or swelling.  No previous history of DVT or PE.        Past Medical History:  Diagnosis Date  . Arthritis   . BPH (benign prostatic hyperplasia)   . BPH with obstruction/lower urinary tract symptoms   . Diabetes ([place])   . Disorder resulting from impaired renal function   . Elevated PSA   . Heart murmur   . Hypertension   . Knee pain   . Low HDL (under 40)   . Obesity   . Urinary hesitancy     Patient Active Problem List   Diagnosis Date Noted  . BPH with obstruction/lower urinary tract symptoms 11/21/2014  . Elevated PSA 11/21/2014    Past Surgical History:  Procedure Laterality Date  . HERNIA REPAIR  1610   Umbilical Hernia Repair  . KNEE ARTHROSCOPY Left 04/10/2015   Procedure: ARTHROSCOPY KNEE, partial medial meniscectomy, partial synovectomy;  Surgeon: Hessie Knows, MD;  Location: [facility] ORS;  Service: Orthopedics;  Laterality: Left;    Prior to Admission medications   Medication Sig Start Date End Date Taking? Authorizing Provider  albuterol (PROVENTIL HFA;VENTOLIN HFA) 108 (90 BASE) MCG/ACT inhaler Inhale 2 puffs into the lungs every 6 (six) hours as needed for  wheezing or shortness of breath.    [provider]  aspirin 81 MG tablet Take 81 mg by mouth daily.    [provider]  finasteride (PROSCAR) 5 MG tablet TAKE 1 TABLET BY MOUTH EVERY DAY 11/09/17   McGowan, Larene Beach A, PA-C  glipiZIDE (GLUCOTROL XL) 10 MG 24 hr tablet 1 BY MOUTH DAILY FOR DIABETES 11/09/14   [provider]  HYDROcodone-acetaminophen (NORCO) 5-325 MG tablet Take 1 tablet by mouth every 6 (six) hours as needed for moderate pain. Patient not taking: Reported on 12/16/2015 04/10/15   Hessie Knows, MD  lisinopril-hydrochlorothiazide (PRINZIDE,ZESTORETIC) 20-25 MG per tablet Take 1 tablet by mouth daily.    [provider]  meloxicam (MOBIC) 15 MG tablet Take 1 tablet (15 mg total) by mouth daily. 05/07/17   Triplett, Johnette Abraham B, FNP  metFORMIN (GLUCOPHAGE) 1000 MG tablet Take 1,000 mg by mouth 2 (two) times daily with a meal.    [provider]  simvastatin (ZOCOR) 40 MG tablet Take 40 mg by mouth daily.    [provider]    Allergies Patient has no known allergies.  Family History  Problem Relation Age of Onset  . Diabetes Mother   . Kidney disease Neg Hx   . Prostate cancer Neg Hx     Social History Social History   Tobacco Use  . Smoking status: Current Every Day Smoker    Packs/day: 1.00    Types: Cigarettes  . Smokeless tobacco: Never Used  Vaping Use  . Vaping Use: Never used  Substance Use Topics  . Alcohol use: No    Alcohol/week: 0.0 standard drinks  . Drug use: No    Review of Systems Constitutional: No fever/chills Eyes: No visual changes. ENT: No sore throat. Cardiovascular: Positive for chest pain. Respiratory: Denies shortness of breath. Gastrointestinal: No abdominal pain.  No nausea, no vomiting.  No diarrhea.  No constipation. Genitourinary: Negative for dysuria. Musculoskeletal: Negative for neck pain.  Negative for back pain. Integumentary: Negative for rash. Neurological: Negative for  headaches, focal weakness or numbness.  ____________________________________________   PHYSICAL EXAM:  VITAL SIGNS: ED Triage Vitals  Enc Vitals Group     BP 01/16/20 2033 (!) 141/72     Pulse Rate 01/16/20 2033 91     Resp 01/16/20 2033 20     Temp 01/16/20 2033 98.3 F (36.8 C)     Temp Source 01/16/20 2033 Oral     SpO2 01/16/20 2033 97 %     Weight 01/16/20 2034 97.5 kg (215 lb)     Height 01/16/20 2034 1.829 m (6')     Head Circumference --      Peak Flow --      Pain Score 01/16/20 2033 3     Pain Loc --      Pain Edu? --      Excl. in [place]? --     Constitutional: Alert and oriented.  Eyes: Conjunctivae are normal.  Head: Atraumatic. Mouth/Throat: Patient is wearing a mask. Neck: No stridor.  No meningeal signs.   Cardiovascular: Normal rate, regular rhythm. Good peripheral circulation. Grossly normal heart sounds. Respiratory: Normal respiratory effort.  No retractions. Gastrointestinal: Soft and nontender. No distention.  Musculoskeletal: No lower extremity tenderness nor edema. No gross deformities of extremities. Neurologic:  Normal speech and language. No gross focal neurologic deficits are appreciated.  Skin:  Skin is warm, dry and intact. Psychiatric: Mood and affect are normal. Speech and behavior are normal.  ____________________________________________   LABS (all labs ordered are listed, but only abnormal results are displayed)  Labs Reviewed  CBC WITH DIFFERENTIAL/PLATELET - Abnormal; Notable for the following components:      Result Value   WBC 13.5 (*)    Platelets 421 (*)    Neutro Abs 8.8 (*)    All other components within normal limits  COMPREHENSIVE METABOLIC PANEL - Abnormal; Notable for the following components:   AST 13 (*)    GFR, Estimated 59 (*)    All other components within normal limits  TROPONIN I (HIGH SENSITIVITY)  TROPONIN I (HIGH SENSITIVITY)   ____________________________________________  EKG  ED ECG REPORT I,  Hickman N Aeron Lheureux, the attending physician, personally viewed and interpreted this ECG.   Date: 01/16/2020  EKG Time: 8:30 PM  Rate: 91  Rhythm: Normal sinus rhythm with premature ventricular contractions  Axis: Normal  Intervals: Normal  ST&T Change: None  ____________________________________________  RADIOLOGY I, [place] N Sevastian Witczak, personally viewed and evaluated these images (plain radiographs) as part of my medical decision making, as well as reviewing the written report by the radiologist.  ED MD interpretation: Interval development of diffuse interstitial nodular pulmonary infiltrates likely related to atypical infection in the acute setting per radiologist on chest x-ray impression.  Official radiology report(s): DG Chest 2 View  Result Date: 01/16/2020 CLINICAL DATA:  Chest pain EXAM: CHEST - 2 VIEW COMPARISON:  05/17/2014 FINDINGS: The lungs are symmetrically, mildly hyperinflated, new since prior examination. Additionally, there has  developed a diffuse slightly nodular interstitial infiltrate. Together, the findings are suggestive of atypical infection in the acute setting. If chronic, granulomatous inflammation or hypersensitivity pneumonitis could appear in this fashion. No pneumothorax or pleural effusion. Cardiac size within normal limits. Pulmonary vascularity is normal. No acute bone abnormality. IMPRESSION: Interval development of diffuse interstitial nodular pulmonary infiltrate, likely related to atypical infection in the acute setting. Follow-up chest radiograph in 4-6 weeks may be helpful to document resolution. If persistent, this would be better assessed with high-resolution CT examination. Electronically Signed   By: Fidela Salisbury MD   On: 01/16/2020 20:54      Procedures   ____________________________________________   INITIAL IMPRESSION / MDM / [place] / ED COURSE  As part of my medical decision making, I reviewed the following data within the  electronic MEDICAL RECORD [id number] year old male presented with above-stated history and physical exam secondary to chest pain.  Chest x-ray findings concerning for multiple bilateral pulmonary nodules and as such CT chest was performed which is concerning for pulmonary lymphoma versus metastatic disease versus asbestosis.  Spoke with patient at length regarding the necessity of following up with Dr. Rogue Bussing oncology today. ____________________________________________  FINAL CLINICAL IMPRESSION(S) / ED DIAGNOSES  Final diagnoses:  Lung mass     MEDICATIONS GIVEN DURING THIS VISIT:  Medications  iohexol (OMNIPAQUE) 350 MG/ML injection 75 mL (75 mLs Intravenous Contrast Given 01/17/20 0013)     ED Discharge Orders    None      *Please note:  JONANTHAN BOLENDER was evaluated in Emergency Department on 01/17/2020 for the symptoms described in the history of present illness. He was evaluated in the context of the global COVID-19 pandemic, which necessitated consideration that the patient might be at risk for infection with the SARS-CoV-2 virus that causes COVID-19. Institutional protocols and algorithms that pertain to the evaluation of patients at risk for COVID-19 are in a state of rapid change based on information released by regulatory bodies including the CDC and federal and state organizations. These policies and algorithms were followed during the patient's care in the ED.  Some ED evaluations and interventions may be delayed as a result of limited staffing during and after the pandemic.*  Note:  This document was prepared using Dragon voice recognition software and may include unintentional dictation errors.   Gregor Hams, MD 01/17/20 [id number]

## 2020-01-23 ENCOUNTER — Other Ambulatory Visit: Payer: Self-pay | Admitting: *Deleted

## 2020-01-23 ENCOUNTER — Inpatient Hospital Stay: Payer: Medicare Other

## 2020-01-23 ENCOUNTER — Inpatient Hospital Stay: Payer: Medicare Other | Attending: Internal Medicine | Admitting: Internal Medicine

## 2020-01-23 ENCOUNTER — Other Ambulatory Visit: Payer: Self-pay

## 2020-01-23 ENCOUNTER — Encounter: Payer: Self-pay | Admitting: Internal Medicine

## 2020-01-23 ENCOUNTER — Encounter: Payer: Self-pay | Admitting: *Deleted

## 2020-01-23 DIAGNOSIS — C7951 Secondary malignant neoplasm of bone: Secondary | ICD-10-CM | POA: Insufficient documentation

## 2020-01-23 DIAGNOSIS — R918 Other nonspecific abnormal finding of lung field: Secondary | ICD-10-CM | POA: Diagnosis not present

## 2020-01-23 DIAGNOSIS — Z7984 Long term (current) use of oral hypoglycemic drugs: Secondary | ICD-10-CM | POA: Diagnosis not present

## 2020-01-23 DIAGNOSIS — F17210 Nicotine dependence, cigarettes, uncomplicated: Secondary | ICD-10-CM | POA: Insufficient documentation

## 2020-01-23 DIAGNOSIS — E669 Obesity, unspecified: Secondary | ICD-10-CM | POA: Insufficient documentation

## 2020-01-23 DIAGNOSIS — Z833 Family history of diabetes mellitus: Secondary | ICD-10-CM | POA: Insufficient documentation

## 2020-01-23 DIAGNOSIS — C7800 Secondary malignant neoplasm of unspecified lung: Secondary | ICD-10-CM | POA: Diagnosis present

## 2020-01-23 DIAGNOSIS — N400 Enlarged prostate without lower urinary tract symptoms: Secondary | ICD-10-CM | POA: Insufficient documentation

## 2020-01-23 DIAGNOSIS — E119 Type 2 diabetes mellitus without complications: Secondary | ICD-10-CM | POA: Insufficient documentation

## 2020-01-23 DIAGNOSIS — R972 Elevated prostate specific antigen [PSA]: Secondary | ICD-10-CM | POA: Insufficient documentation

## 2020-01-23 DIAGNOSIS — I119 Hypertensive heart disease without heart failure: Secondary | ICD-10-CM | POA: Diagnosis not present

## 2020-01-23 DIAGNOSIS — Z807 Family history of other malignant neoplasms of lymphoid, hematopoietic and related tissues: Secondary | ICD-10-CM | POA: Insufficient documentation

## 2020-01-23 DIAGNOSIS — C61 Malignant neoplasm of prostate: Secondary | ICD-10-CM | POA: Diagnosis not present

## 2020-01-23 DIAGNOSIS — K5900 Constipation, unspecified: Secondary | ICD-10-CM | POA: Diagnosis not present

## 2020-01-23 DIAGNOSIS — R634 Abnormal weight loss: Secondary | ICD-10-CM | POA: Insufficient documentation

## 2020-01-23 DIAGNOSIS — Z79899 Other long term (current) drug therapy: Secondary | ICD-10-CM | POA: Insufficient documentation

## 2020-01-23 DIAGNOSIS — Z791 Long term (current) use of non-steroidal anti-inflammatories (NSAID): Secondary | ICD-10-CM | POA: Diagnosis not present

## 2020-01-23 DIAGNOSIS — Z7982 Long term (current) use of aspirin: Secondary | ICD-10-CM | POA: Insufficient documentation

## 2020-01-23 DIAGNOSIS — R970 Elevated carcinoembryonic antigen [CEA]: Secondary | ICD-10-CM | POA: Diagnosis not present

## 2020-01-23 LAB — CBC WITH DIFFERENTIAL/PLATELET
Abs Immature Granulocytes: 0.05 10*3/uL (ref 0.00–0.07)
Basophils Absolute: 0.1 10*3/uL (ref 0.0–0.1)
Basophils Relative: 1 %
Eosinophils Absolute: 0.2 10*3/uL (ref 0.0–0.5)
Eosinophils Relative: 2 %
HCT: 39.7 % (ref 39.0–52.0)
Hemoglobin: 13.3 g/dL (ref 13.0–17.0)
Immature Granulocytes: 0 %
Lymphocytes Relative: 25 %
Lymphs Abs: 2.9 10*3/uL (ref 0.7–4.0)
MCH: 28.4 pg (ref 26.0–34.0)
MCHC: 33.5 g/dL (ref 30.0–36.0)
MCV: 84.6 fL (ref 80.0–100.0)
Monocytes Absolute: 0.9 10*3/uL (ref 0.1–1.0)
Monocytes Relative: 8 %
Neutro Abs: 7.4 10*3/uL (ref 1.7–7.7)
Neutrophils Relative %: 64 %
Platelets: 509 10*3/uL — ABNORMAL HIGH (ref 150–400)
RBC: 4.69 MIL/uL (ref 4.22–5.81)
RDW: 14.8 % (ref 11.5–15.5)
WBC: 11.4 10*3/uL — ABNORMAL HIGH (ref 4.0–10.5)
nRBC: 0 % (ref 0.0–0.2)

## 2020-01-23 LAB — PSA: Prostatic Specific Antigen: 986 ng/mL — ABNORMAL HIGH (ref 0.00–4.00)

## 2020-01-23 LAB — LACTATE DEHYDROGENASE: LDH: 90 U/L — ABNORMAL LOW (ref 98–192)

## 2020-01-23 NOTE — Assessment & Plan Note (Addendum)
#  Multiple bilateral lung nodules with right upper lobe cavitary lesion measuring up to 2 cm; CT scan also showing extensive nodular pleural thickening bilaterally more than the left lower lobe.  Is highly suspicious of malignancy-less likely infection/especially given history of smoking.  #Recommend PET scan-discussed the need for biopsy to help confirm the suspicion of malignancy.  Also recommend tumor markers CEA LDH; CA 19-9 PSA.  #Constipation-continue symptomatic treatment for now.  Await above imaging.   # I spoke at length with the patient's daughter- regarding the patient's clinical status/plan of care.  Family agreement.   Thank you allowing me to participate in the care of your pleasant patient. Please do not hesitate to contact me with questions or concerns in the interim.  Discussed with Hildred Alamin.   # DISPOSITION: # PET ASAP # labs today- ordered # Follow up in 2 weeks-MD; No labs- Dr.B  # I reviewed the blood work- with the patient in detail; also reviewed the imaging independently [as summarized above]; and with the patient in detail.   # 60 minutes face-to-face with the patient discussing the above plan of care; more than 50% of time spent on prognosis/ natural history; counseling and coordination.

## 2020-01-23 NOTE — Progress Notes (Signed)
Pt here as new lung lesion.

## 2020-01-23 NOTE — Progress Notes (Signed)
  Oncology Nurse Navigator Documentation  Navigator Location: [facility]Med [facility]c (01/23/20 1300) Referral Date to RadOnc/MedOnc: 01/17/20 (01/23/20 1300) )Navigator Encounter Type: Initial MedOnc (01/23/20 1300)   Abnormal Finding Date: 01/17/20 (01/23/20 1300)                   Treatment Phase: Abnormal Scans (01/23/20 1300) Barriers/Navigation Needs: Coordination of Care (01/23/20 1300)   Interventions: Coordination of Care (01/23/20 1300)   Coordination of Care: Appts;Radiology (01/23/20 1300)        Acuity: Level 2-Minimal Needs (1-2 Barriers Identified) (01/23/20 1300)      met with patient and his daughter during initial consult with Dr. Rogue Bussing today. All questions answered during visit. Reviewed upcoming appts with daughter. Contact info given and instructed to call with any further questions or needs. Pt and his daughter verbalized understanding.   Time Spent with Patient: 60 (01/23/20 1300)

## 2020-01-23 NOTE — Progress Notes (Signed)
[place] NOTE  Patient Care Team: Donnie Coffin, [name], RN as Oncology Nurse Navigator  CHIEF COMPLAINTS/PURPOSE OF CONSULTATION: lung nodules  # NOV 2021- CTA [ER] Diffuse bilateral pulmonary nodules including a cavitary lesion in the right upper lobe measuring approximately 2 cm. There is extensive nodular pleural thickening bilaterally, greatest in the left lower lobe. Differential considerations include but are not limited to pulmonary lymphoma, metastatic disease with pleural  carcinomatosis, or asbestos related lung disease.3. Indeterminate, partially visualized at least 2.2 cm left adrenal nodule.  # DM-2; NO COPD [?]; ACTIVE SMOKER 65ppd.   Oncology History   No history exists.     HISTORY OF PRESENTING ILLNESS:  Brent Glass 79 y.o.  male history of smoking is here for further evaluation and recommendations for lung mass/nodules.  Patient noted to have a sore spot anterior chest for about a week.  This led to further evaluation with a CT angiogram in the emergency room as noted above.  Patient has been referred to [place] for further evaluation recommendations.  Patient has intermittent constipation.  There is no blood in stools black or stools.  No nausea no vomiting.  Weight loss of 5 to 6 pounds in the last 6 months.  Appetite is fair.  No headaches.   Review of Systems  Constitutional: Positive for weight loss. Negative for chills, diaphoresis, fever and malaise/fatigue.  HENT: Negative for nosebleeds and sore throat.   Eyes: Negative for double vision.  Respiratory: Positive for cough. Negative for hemoptysis, sputum production, shortness of breath and wheezing.   Cardiovascular: Negative for chest pain, palpitations, orthopnea and leg swelling.  Gastrointestinal: Negative for abdominal pain, blood in stool, constipation, diarrhea, heartburn, melena, nausea and vomiting.  Genitourinary: Negative for  dysuria, frequency and urgency.  Musculoskeletal: Positive for back pain. Negative for joint pain.  Skin: Negative.  Negative for itching and rash.  Neurological: Negative for dizziness, tingling, focal weakness, weakness and headaches.  Endo/Heme/Allergies: Does not bruise/bleed easily.  Psychiatric/Behavioral: Negative for depression. The patient is not nervous/anxious and does not have insomnia.      MEDICAL HISTORY:  Past Medical History:  Diagnosis Date  . Arthritis   . BPH (benign prostatic hyperplasia)   . BPH with obstruction/lower urinary tract symptoms   . Diabetes ([place])   . Disorder resulting from impaired renal function   . Elevated PSA   . Heart murmur   . Hypertension   . Knee pain   . Lesion of lung   . Low HDL (under 40)   . Obesity   . Urinary hesitancy     SURGICAL HISTORY: Past Surgical History:  Procedure Laterality Date  . HERNIA REPAIR  0258   Umbilical Hernia Repair  . KNEE ARTHROSCOPY Left 04/10/2015   Procedure: ARTHROSCOPY KNEE, partial medial meniscectomy, partial synovectomy;  Surgeon: Hessie Knows, MD;  Location: [facility] ORS;  Service: Orthopedics;  Laterality: Left;    SOCIAL HISTORY: Social History   Socioeconomic History  . Marital status: Widowed    Spouse name: Not on file  . Number of children: Not on file  . Years of education: Not on file  . Highest education level: Not on file  Occupational History  . Not on file  Tobacco Use  . Smoking status: Current Every Day Smoker    Packs/day: 1.00    Types: Cigarettes  . Smokeless tobacco: Never Used  Vaping Use  . Vaping Use: Never  used  Substance and Sexual Activity  . Alcohol use: No    Alcohol/week: 0.0 standard drinks  . Drug use: No  . Sexual activity: Not Currently  Other Topics Concern  . Not on file  Social History Narrative   Lives in [place]; self; daughter lives 1 mile. Smoke 1ppd > 65 years; stopped 25 years ago.  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 Exposure to  Asbestos/laundry.    Social Determinants of Health   Financial Resource Strain:   . Difficulty of Paying Living Expenses: Not on file  Food Insecurity:   . Worried About Charity fundraiser in the Last Year: Not on file  . Ran Out of Food in the Last Year: Not on file  Transportation Needs:   . Lack of Transportation (Medical): Not on file  . Lack of Transportation (Non-Medical): Not on file  Physical Activity:   . Days of Exercise per Week: Not on file  . Minutes of Exercise per Session: Not on file  Stress:   . Feeling of Stress : Not on file  Social Connections:   . Frequency of Communication with Friends and Family: Not on file  . Frequency of Social Gatherings with Friends and Family: Not on file  . Attends Religious Services: Not on file  . Active Member of Clubs or Organizations: Not on file  . Attends Archivist Meetings: Not on file  . Marital Status: Not on file  Intimate Partner Violence:   . Fear of Current or Ex-Partner: Not on file  . Emotionally Abused: Not on file  . Physically Abused: Not on file  . Sexually Abused: Not on file    FAMILY HISTORY: Family History  Problem Relation Age of Onset  . Diabetes Mother   . Lymphoma Child        died in 40.   . Kidney disease Neg Hx   . Prostate cancer Neg Hx     ALLERGIES:  has No Known Allergies.  MEDICATIONS:  Current Outpatient Medications  Medication Sig Dispense Refill  . albuterol (PROVENTIL HFA;VENTOLIN HFA) 108 (90 BASE) MCG/ACT inhaler Inhale 2 puffs into the lungs every 6 (six) hours as needed for wheezing or shortness of breath.    Marland Kitchen aspirin 81 MG tablet Take 81 mg by mouth daily.    . finasteride (PROSCAR) 5 MG tablet TAKE 1 TABLET BY MOUTH EVERY DAY 90 tablet 3  . glipiZIDE (GLUCOTROL XL) 10 MG 24 hr tablet 1 BY MOUTH DAILY FOR DIABETES  2  . lisinopril-hydrochlorothiazide (PRINZIDE,ZESTORETIC) 20-25 MG per tablet Take 1 tablet by mouth daily.    . meloxicam (MOBIC) 15 MG tablet Take  1 tablet (15 mg total) by mouth daily. 14 tablet 0  . metFORMIN (GLUCOPHAGE) 1000 MG tablet Take 1,000 mg by mouth 2 (two) times daily with a meal.    . simvastatin (ZOCOR) 40 MG tablet Take 40 mg by mouth daily.    Marland Kitchen HYDROcodone-acetaminophen (NORCO) 5-325 MG tablet Take 1 tablet by mouth every 6 (six) hours as needed for moderate pain. (Patient not taking: Reported on 12/16/2015) 30 tablet 0   No current facility-administered medications for this visit.      Marland Kitchen  PHYSICAL EXAMINATION: ECOG PERFORMANCE STATUS: 0 - Asymptomatic  Vitals:   01/23/20 1155  BP: (!) 116/59  Pulse: 86  Resp: 16  Temp: 98.2 F (36.8 C)  SpO2: 98%   Filed Weights   01/23/20 1155  Weight: 215 lb (97.5 kg)  Physical Exam HENT:     Head: Normocephalic and atraumatic.     Mouth/Throat:     Pharynx: No oropharyngeal exudate.  Eyes:     Pupils: Pupils are equal, round, and reactive to light.  Cardiovascular:     Rate and Rhythm: Normal rate and regular rhythm.  Pulmonary:     Effort: No respiratory distress.     Breath sounds: No wheezing.     Comments: Decreased breath sounds bilaterally. Abdominal:     General: Bowel sounds are normal. There is no distension.     Palpations: Abdomen is soft. There is no mass.     Tenderness: There is no abdominal tenderness. There is no guarding or rebound.  Musculoskeletal:        General: No tenderness. Normal range of motion.     Cervical back: Normal range of motion and neck supple.  Skin:    General: Skin is warm.  Neurological:     Mental Status: He is alert and oriented to person, place, and time.  Psychiatric:        Mood and Affect: Affect normal.      LABORATORY DATA:  I have reviewed the data as listed Lab Results  Component Value Date   WBC 11.4 (H) 01/23/2020   HGB 13.3 01/23/2020   HCT 39.7 01/23/2020   MCV 84.6 01/23/2020   PLT 509 (H) 01/23/2020   Recent Labs    01/16/20 2036  NA 138  K 3.9  CL 103  CO2 22  GLUCOSE 97   BUN 16  CREATININE 1.23  CALCIUM 9.4  GFRNONAA 59*  PROT 7.6  ALBUMIN 3.8  AST 13*  ALT 12  ALKPHOS 120  BILITOT 0.7    RADIOGRAPHIC STUDIES: I have personally reviewed the radiological images as listed and agreed with the findings in the report. DG Chest 2 View  Result Date: 01/16/2020 CLINICAL DATA:  Chest pain EXAM: CHEST - 2 VIEW COMPARISON:  05/17/2014 FINDINGS: The lungs are symmetrically, mildly hyperinflated, new since prior examination. Additionally, there has developed a diffuse slightly nodular interstitial infiltrate. Together, the findings are suggestive of atypical infection in the acute setting. If chronic, granulomatous inflammation or hypersensitivity pneumonitis could appear in this fashion. No pneumothorax or pleural effusion. Cardiac size within normal limits. Pulmonary vascularity is normal. No acute bone abnormality. IMPRESSION: Interval development of diffuse interstitial nodular pulmonary infiltrate, likely related to atypical infection in the acute setting. Follow-up chest radiograph in 4-6 weeks may be helpful to document resolution. If persistent, this would be better assessed with high-resolution CT examination. Electronically Signed   By: Fidela Salisbury MD   On: 01/16/2020 20:54   CT Angio Chest PE W and/or Wo Contrast  Result Date: 01/17/2020 CLINICAL DATA:  PE suspected.  Chest pain. EXAM: CT ANGIOGRAPHY CHEST WITH CONTRAST TECHNIQUE: Multidetector CT imaging of the chest was performed using the standard protocol during bolus administration of intravenous contrast. Multiplanar CT image reconstructions and MIPs were obtained to evaluate the vascular anatomy. CONTRAST:  63mL OMNIPAQUE IOHEXOL 350 MG/ML SOLN COMPARISON:  150 mL of Omnipaque 350. FINDINGS: Cardiovascular: Evaluation was limited by respiratory motion artifact. Within that limitation, there is no central pulmonary embolus. The size of the main pulmonary artery is normal. Cardiomegaly with coronary  artery calcification. The course and caliber of the aorta are normal. There is mild atherosclerotic calcification. Opacification decreased due to pulmonary arterial phase contrast bolus timing. Mediastinum/Nodes: -- No mediastinal lymphadenopathy. -- No hilar lymphadenopathy. -- No axillary  lymphadenopathy. -- No supraclavicular lymphadenopathy. -- Normal thyroid gland where visualized. -  Unremarkable esophagus. Lungs/Pleura: There are innumerable pulmonary nodules bilaterally. There is a cavitary pulmonary nodule in the right upper lobe measuring approximately 2 cm. There is nodular thickening along the major and minor fissures. There are diffuse pleural base nodules at the lung bases bilaterally, left worse than right. There is no pneumothorax. There are trace bilateral pleural effusions, left greater than right. Upper Abdomen: Contrast bolus timing is not optimized for evaluation of the abdominal organs. There is a partially visualized nodule in the left adrenal gland measuring at least 2.2 cm there is diverticulosis of the partially visualized colon. Musculoskeletal: No chest wall abnormality. No bony spinal canal stenosis. Review of the MIP images confirms the above findings. IMPRESSION: 1. Suboptimal examination for the detection of acute pulmonary emboli. Given this limitation, no acute pulmonary embolism was detected. 2. Diffuse bilateral pulmonary nodules including a cavitary lesion in the right upper lobe measuring approximately 2 cm. There is extensive nodular pleural thickening bilaterally, greatest in the left lower lobe. Differential considerations include but are not limited to pulmonary lymphoma, metastatic disease with pleural carcinomatosis, or asbestos related lung disease. 3. Indeterminate, partially visualized at least 2.2 cm left adrenal nodule. Aortic Atherosclerosis (ICD10-I70.0). Electronically Signed   By: Constance Holster M.D.   On: 01/17/2020 00:45    ASSESSMENT & PLAN:   Multiple  lung nodules #Multiple bilateral lung nodules with right upper lobe cavitary lesion measuring up to 2 cm; CT scan also showing extensive nodular pleural thickening bilaterally more than the left lower lobe.  Is highly suspicious of malignancy-less likely infection/especially given history of smoking.  #Recommend PET scan-discussed the need for biopsy to help confirm the suspicion of malignancy.  Also recommend tumor markers CEA LDH; CA 19-9 PSA.  #Constipation-continue symptomatic treatment for now.  Await above imaging.   # I spoke at length with the patient's daughter- regarding the patient's clinical status/plan of care.  Family agreement.   Thank you allowing me to participate in the care of your pleasant patient. Please do not hesitate to contact me with questions or concerns in the interim.  Discussed with Hildred Alamin.   # DISPOSITION: # PET ASAP # labs today- ordered # Follow up in 2 weeks-MD; No labs- Dr.B  # I reviewed the blood work- with the patient in detail; also reviewed the imaging independently [as summarized above]; and with the patient in detail.   # 60 minutes face-to-face with the patient discussing the above plan of care; more than 50% of time spent on prognosis/ natural history; counseling and coordination.    All questions were answered. The patient knows to call the clinic with any problems, questions or concerns.    Cammie Sickle, MD 01/23/2020 1:27 PM

## 2020-01-24 ENCOUNTER — Other Ambulatory Visit: Payer: Self-pay

## 2020-01-24 ENCOUNTER — Ambulatory Visit
Admission: RE | Admit: 2020-01-24 | Discharge: 2020-01-24 | Disposition: A | Discharge status: Home / Self Care | Payer: Medicare Other | Source: Ambulatory Visit | Attending: Internal Medicine | Admitting: Internal Medicine

## 2020-01-24 DIAGNOSIS — N400 Enlarged prostate without lower urinary tract symptoms: Secondary | ICD-10-CM | POA: Insufficient documentation

## 2020-01-24 DIAGNOSIS — R918 Other nonspecific abnormal finding of lung field: Secondary | ICD-10-CM | POA: Diagnosis present

## 2020-01-24 DIAGNOSIS — R590 Localized enlarged lymph nodes: Secondary | ICD-10-CM | POA: Insufficient documentation

## 2020-01-24 LAB — CANCER ANTIGEN 19-9: CA 19-9: 11 U/mL (ref 0–35)

## 2020-01-24 LAB — GLUCOSE, CAPILLARY: Glucose-Capillary: 79 mg/dL (ref 70–99)

## 2020-01-24 LAB — CEA: CEA: 5.6 ng/mL — ABNORMAL HIGH (ref 0.0–4.7)

## 2020-01-24 MED ORDER — FLUDEOXYGLUCOSE F - 18 (FDG) INJECTION
11.1000 | Freq: Once | INTRAVENOUS | Status: AC | PRN
  Administered 2020-01-24: 11.43 via INTRAVENOUS

## 2020-01-25 ENCOUNTER — Telehealth: Payer: Self-pay | Admitting: *Deleted

## 2020-01-25 NOTE — Telephone Encounter (Signed)
briefly reviewed PET scan results with pt's daughter, [name]. Informed that Dr. Jacinto Reap will follow up with her this afternoon as well. [name] verbalized understanding and did not have any questions during call.  [name] [phone]

## 2020-01-28 ENCOUNTER — Telehealth: Payer: Self-pay | Admitting: Internal Medicine

## 2020-01-28 NOTE — Telephone Encounter (Signed)
On 11/19-discussed with the patient's daughter; PET scan - highly suspicious of malignancy; especially prostate given the elevated PSA of 900.  The daughter prefers to keep the news to herself; and let me discuss with the patient at next visit. Will discuss next plan of care/treatment options at next visit.  Follow-up as planned.

## 2020-02-04 ENCOUNTER — Inpatient Hospital Stay (HOSPITAL_BASED_OUTPATIENT_CLINIC_OR_DEPARTMENT_OTHER): Payer: Medicare Other | Admitting: Internal Medicine

## 2020-02-04 ENCOUNTER — Encounter: Payer: Self-pay | Admitting: *Deleted

## 2020-02-04 ENCOUNTER — Encounter: Payer: Self-pay | Admitting: Internal Medicine

## 2020-02-04 ENCOUNTER — Other Ambulatory Visit: Payer: Self-pay

## 2020-02-04 DIAGNOSIS — C61 Malignant neoplasm of prostate: Secondary | ICD-10-CM | POA: Diagnosis not present

## 2020-02-04 DIAGNOSIS — Z7189 Other specified counseling: Secondary | ICD-10-CM

## 2020-02-04 NOTE — Assessment & Plan Note (Addendum)
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  November 2021 PET scan-multiple lung lesions multiple bone lesions and prostate uptake.  Will discuss with urology the need for biopsy/NGS.  We will also review tumor conference.  #Long discussion with patient and daughter regarding the above diagnosis; stage of cancer.  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  However does have good treatment options; survival in the order of years.     Again discussed at length the importance of starting ADT-to treat his progressive prostate cancer.  I reviewed the mechanism of action of ADT/blocking testosterone.  Again reviewed the potential side effects including but not limited to-fatigue hot flashes loss of libido.  Also reviewed that bone health/cardiovascular as long-term complications.  I would recommend Firmagon.  Patient and daughter understand treatments are palliative not curative. Discussed treatments are in general indefinite.   #Since patient is castrate sensitive and bulky disease-candidate for Taxotere/Zytiga.  We will discuss further.  # DISPOSITION: # Firmagon injection next week- NEW # Follow up in 2 weeks-MD; No labs- Dr.B  # I reviewed the blood work- with the patient in detail; also reviewed the imaging independently [as summarized above]; and with the patient in detail.   # 40 minutes face-to-face with the patient discussing the above plan of care; more than 50% of time spent on prognosis/ natural history; counseling and coordination.

## 2020-02-04 NOTE — Progress Notes (Signed)
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   # NOV 2021- CTA [ER] Diffuse bilateral pulmonary nodules including a cavitary lesion in the right upper lobe measuring approximately 2 cm. There is extensive nodular pleural thickening bilaterally, greatest in the left lower lobe.  November 2021 PET scan-multiple lung lesions multiple bone lesions and prostate uptake.    # DM-2; NO COPD [?]; ACTIVE SMOKER 65ppd.  # DEC 2nd week- FIRMAGON.   # NGS/MOLECULAR TESTS:    # PALLIATIVE CARE EVALUATION:  # PAIN MANAGEMENT:    DIAGNOSIS: Prostate cancer  STAGE:   IV      ;  GOALS: Palliative  CURRENT/MOST RECENT THERAPY : [facility]    Prostate cancer metastatic to multiple sites [facility])  02/04/2020 Initial Diagnosis   Prostate cancer metastatic to multiple sites ([place])      HISTORY OF PRESENTING ILLNESS:  Brent Glass 79 y.o.  male with recent diagnosis of multiple lung nodules; is here to review the results of his blood work and also PET scan.  Patient currently denies any unusual symptoms except for mild weight loss in the last 5 to 6 months.  Appetite is fair.  No headaches.  No nausea no vomiting.  Denies any significant bone pain.  Review of Systems  Constitutional: Positive for weight loss. Negative for chills, diaphoresis, fever and malaise/fatigue.  HENT: Negative for nosebleeds and sore throat.   Eyes: Negative for double vision.  Respiratory: Positive for cough. Negative for hemoptysis, sputum production, shortness of breath and wheezing.   Cardiovascular: Negative for chest pain, palpitations, orthopnea and leg swelling.  Gastrointestinal: Negative for abdominal pain, blood in stool,  constipation, diarrhea, heartburn, melena, nausea and vomiting.  Genitourinary: Negative for dysuria, frequency and urgency.  Musculoskeletal: Positive for back pain. Negative for joint pain.  Skin: Negative.  Negative for itching and rash.  Neurological: Negative for dizziness, tingling, focal weakness, weakness and headaches.  Endo/Heme/Allergies: Does not bruise/bleed easily.  Psychiatric/Behavioral: Negative for depression. The patient is not nervous/anxious and does not have insomnia.      MEDICAL HISTORY:  Past Medical History:  Diagnosis Date  . Arthritis   . BPH (benign prostatic hyperplasia)   . BPH with obstruction/lower urinary tract symptoms   . Diabetes ([place])   . Disorder resulting from impaired renal function   . Elevated PSA   . Heart murmur   . Hypertension   . Knee pain   . Lesion of lung   . Low HDL (under 40)   . Obesity   . Urinary hesitancy     SURGICAL HISTORY: Past Surgical History:  Procedure Laterality Date  . HERNIA REPAIR  0539   Umbilical Hernia Repair  . KNEE ARTHROSCOPY Left 04/10/2015   Procedure: ARTHROSCOPY KNEE, partial medial meniscectomy, partial synovectomy;  Surgeon: Hessie Knows, MD;  Location: [facility] ORS;  Service: Orthopedics;  Laterality: Left;    SOCIAL HISTORY: Social History   Socioeconomic History  . Marital status: Widowed    Spouse name: Not on file  . Number of children: Not on file  . Years of education: Not on file  . Highest education level: Not on file  Occupational History  . Not on file  Tobacco Use  .  Smoking status: Current Every Day Smoker    Packs/day: 1.00    Types: Cigarettes  . Smokeless tobacco: Never Used  Vaping Use  . Vaping Use: Never used  Substance and Sexual Activity  . Alcohol use: No    Alcohol/week: 0.0 standard drinks  . Drug use: No  . Sexual activity: Not Currently  Other Topics Concern  . Not on file  Social History Narrative   Lives in [place]; self; daughter lives 1 mile. Smoke  1ppd > 65 years; stopped 25 years ago.  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    Social Determinants of Health   Financial Resource Strain:   . Difficulty of Paying Living Expenses: Not on file  Food Insecurity:   . Worried About Charity fundraiser in the Last Year: Not on file  . Ran Out of Food in the Last Year: Not on file  Transportation Needs:   . Lack of Transportation (Medical): Not on file  . Lack of Transportation (Non-Medical): Not on file  Physical Activity:   . Days of Exercise per Week: Not on file  . Minutes of Exercise per Session: Not on file  Stress:   . Feeling of Stress : Not on file  Social Connections:   . Frequency of Communication with Friends and Family: Not on file  . Frequency of Social Gatherings with Friends and Family: Not on file  . Attends Religious Services: Not on file  . Active Member of Clubs or Organizations: Not on file  . Attends Archivist Meetings: Not on file  . Marital Status: Not on file  Intimate Partner Violence:   . Fear of Current or Ex-Partner: Not on file  . Emotionally Abused: Not on file  . Physically Abused: Not on file  . Sexually Abused: Not on file    FAMILY HISTORY: Family History  Problem Relation Age of Onset  . Diabetes Mother   . Lymphoma Child        died in 40.   . Kidney disease Neg Hx   . Prostate cancer Neg Hx     ALLERGIES:  has No Known Allergies.  MEDICATIONS:  Current Outpatient Medications  Medication Sig Dispense Refill  . albuterol (PROVENTIL HFA;VENTOLIN HFA) 108 (90 BASE) MCG/ACT inhaler Inhale 2 puffs into the lungs every 6 (six) hours as needed for wheezing or shortness of breath.    Marland Kitchen aspirin 81 MG tablet Take 81 mg by mouth daily.    . finasteride (PROSCAR) 5 MG tablet TAKE 1 TABLET BY MOUTH EVERY DAY 90 tablet 3  . glipiZIDE (GLUCOTROL XL) 10 MG 24 hr tablet 1 BY MOUTH DAILY FOR DIABETES  2  . HYDROcodone-acetaminophen (NORCO) 5-325 MG tablet Take 1  tablet by mouth every 6 (six) hours as needed for moderate pain. 30 tablet 0  . lisinopril-hydrochlorothiazide (PRINZIDE,ZESTORETIC) 20-25 MG per tablet Take 1 tablet by mouth daily.    . meloxicam (MOBIC) 15 MG tablet Take 1 tablet (15 mg total) by mouth daily. 14 tablet 0  . metFORMIN (GLUCOPHAGE) 1000 MG tablet Take 1,000 mg by mouth 2 (two) times daily with a meal.    . simvastatin (ZOCOR) 40 MG tablet Take 40 mg by mouth daily.     No current facility-administered medications for this visit.      Marland Kitchen  PHYSICAL EXAMINATION: ECOG PERFORMANCE STATUS: 0 - Asymptomatic  Vitals:   02/04/20 1059  BP: 102/61  Pulse: 81  Resp: 16  Temp: (!)  97.4 F (36.3 C)  SpO2: 97%   Filed Weights   02/04/20 1059  Weight: 210 lb 12.8 oz (95.6 kg)    Physical Exam HENT:     Head: Normocephalic and atraumatic.     Mouth/Throat:     Pharynx: No oropharyngeal exudate.  Eyes:     Pupils: Pupils are equal, round, and reactive to light.  Cardiovascular:     Rate and Rhythm: Normal rate and regular rhythm.  Pulmonary:     Effort: No respiratory distress.     Breath sounds: No wheezing.     Comments: Decreased breath sounds bilaterally. Abdominal:     General: Bowel sounds are normal. There is no distension.     Palpations: Abdomen is soft. There is no mass.     Tenderness: There is no abdominal tenderness. There is no guarding or rebound.  Musculoskeletal:        General: No tenderness. Normal range of motion.     Cervical back: Normal range of motion and neck supple.  Skin:    General: Skin is warm.  Neurological:     Mental Status: He is alert and oriented to person, place, and time.  Psychiatric:        Mood and Affect: Affect normal.      LABORATORY DATA:  I have reviewed the data as listed Lab Results  Component Value Date   WBC 11.4 (H) 01/23/2020   HGB 13.3 01/23/2020   HCT 39.7 01/23/2020   MCV 84.6 01/23/2020   PLT 509 (H) 01/23/2020   Recent Labs    01/16/20 2036   NA 138  K 3.9  CL 103  CO2 22  GLUCOSE 97  BUN 16  CREATININE 1.23  CALCIUM 9.4  GFRNONAA 59*  PROT 7.6  ALBUMIN 3.8  AST 13*  ALT 12  ALKPHOS 120  BILITOT 0.7    RADIOGRAPHIC STUDIES: I have personally reviewed the radiological images as listed and agreed with the findings in the report. DG Chest 2 View  Result Date: 01/16/2020 CLINICAL DATA:  Chest pain EXAM: CHEST - 2 VIEW COMPARISON:  05/17/2014 FINDINGS: The lungs are symmetrically, mildly hyperinflated, new since prior examination. Additionally, there has developed a diffuse slightly nodular interstitial infiltrate. Together, the findings are suggestive of atypical infection in the acute setting. If chronic, granulomatous inflammation or hypersensitivity pneumonitis could appear in this fashion. No pneumothorax or pleural effusion. Cardiac size within normal limits. Pulmonary vascularity is normal. No acute bone abnormality. IMPRESSION: Interval development of diffuse interstitial nodular pulmonary infiltrate, likely related to atypical infection in the acute setting. Follow-up chest radiograph in 4-6 weeks may be helpful to document resolution. If persistent, this would be better assessed with high-resolution CT examination. Electronically Signed   By: Fidela Salisbury MD   On: 01/16/2020 20:54   CT Angio Chest PE W and/or Wo Contrast  Result Date: 01/17/2020 CLINICAL DATA:  PE suspected.  Chest pain. EXAM: CT ANGIOGRAPHY CHEST WITH CONTRAST TECHNIQUE: Multidetector CT imaging of the chest was performed using the standard protocol during bolus administration of intravenous contrast. Multiplanar CT image reconstructions and MIPs were obtained to evaluate the vascular anatomy. CONTRAST:  72mL OMNIPAQUE IOHEXOL 350 MG/ML SOLN COMPARISON:  150 mL of Omnipaque 350. FINDINGS: Cardiovascular: Evaluation was limited by respiratory motion artifact. Within that limitation, there is no central pulmonary embolus. The size of the main pulmonary  artery is normal. Cardiomegaly with coronary artery calcification. The course and caliber of the aorta are normal. There  is mild atherosclerotic calcification. Opacification decreased due to pulmonary arterial phase contrast bolus timing. Mediastinum/Nodes: -- No mediastinal lymphadenopathy. -- No hilar lymphadenopathy. -- No axillary lymphadenopathy. -- No supraclavicular lymphadenopathy. -- Normal thyroid gland where visualized. -  Unremarkable esophagus. Lungs/Pleura: There are innumerable pulmonary nodules bilaterally. There is a cavitary pulmonary nodule in the right upper lobe measuring approximately 2 cm. There is nodular thickening along the major and minor fissures. There are diffuse pleural base nodules at the lung bases bilaterally, left worse than right. There is no pneumothorax. There are trace bilateral pleural effusions, left greater than right. Upper Abdomen: Contrast bolus timing is not optimized for evaluation of the abdominal organs. There is a partially visualized nodule in the left adrenal gland measuring at least 2.2 cm there is diverticulosis of the partially visualized colon. Musculoskeletal: No chest wall abnormality. No bony spinal canal stenosis. Review of the MIP images confirms the above findings. IMPRESSION: 1. Suboptimal examination for the detection of acute pulmonary emboli. Given this limitation, no acute pulmonary embolism was detected. 2. Diffuse bilateral pulmonary nodules including a cavitary lesion in the right upper lobe measuring approximately 2 cm. There is extensive nodular pleural thickening bilaterally, greatest in the left lower lobe. Differential considerations include but are not limited to pulmonary lymphoma, metastatic disease with pleural carcinomatosis, or asbestos related lung disease. 3. Indeterminate, partially visualized at least 2.2 cm left adrenal nodule. Aortic Atherosclerosis (ICD10-I70.0). Electronically Signed   By: Constance Holster M.D.   On:  01/17/2020 00:45   NM PET Image Initial (PI) Skull Base To Thigh  Result Date: 01/24/2020 CLINICAL DATA:  Initial treatment strategy for pulmonary nodule. EXAM: NUCLEAR MEDICINE PET SKULL BASE TO THIGH TECHNIQUE: 11.4 mCi F-18 FDG was injected intravenously. Full-ring PET imaging was performed from the skull base to thigh after the radiotracer. CT data was obtained and used for attenuation correction and anatomic localization. Fasting blood glucose: 79 mg/dl COMPARISON:  CT 01/17/2020 FINDINGS: Mediastinal blood pool activity: SUV max 2.1 Liver activity: SUV max NA NECK: No hypermetabolic lymph nodes in the neck. Incidental CT findings: none CHEST: There are multiple bilateral pulmonary nodules. There is pleural thickening at LEFT lung posteriorly and along the surface of the diaphragm. Cavitary nodule in the RIGHT lower lobe. These multiple nodules, cavitary lesion and pleural nodular thickening do not have significant metabolic activity. The cavitary lesion RIGHT lower lobe (image 233) has SUV max 2.0. Nodular thickening in the posterior lung base with SUV max equal 2.9. No hypermetabolic mediastinal lymph nodes. Incidental CT findings: none ABDOMEN/PELVIS: No abnormal hypermetabolic activity within the liver, pancreas, adrenal glands, or spleen. No hypermetabolic lymph nodes in the abdomen or pelvis. There is mild metabolic activity associated LEFT and RIGHT adrenal gland. Enlargement of the LEFT adrenal gland has Hounsfield units consistent with benign adenoma. 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 Prostate gland is enlarged measuring 5.4 cm in diameter. There is moderate heterogeneous metabolic activity within the gland with SUV max equal 4.8. Incidental CT findings: No hypermetabolic abdominopelvic lymph nodes. Mildly enlarged LEFT external iliac lymph node measures 9 mm on image 218, CT series 3. SKELETON: There multiple foci of discrete hypermetabolic activity within the  skeleton. Hypermetabolic lesions associated with bone sclerosis. For example in the RIGHT iliac wing sclerotic lesion with SUV max equal 5.1. Posterior RIGHT iliac bone small sclerotic lesion SUV max equal 5.3. Lesion in the L 2 vertebral body with SUV max equal 8.3. There multiple lesions scattered through the thoracic and  lumbar spine. Intense lesion within the sternum also associated with sclerosis with SUV max equal 3.8 Incidental CT findings: No pathologic fractures IMPRESSION: 1. Multifocal hypermetabolic lesions within the skeleton with associated sclerotic lesions. 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 2. Multiple pulmonary nodules, RIGHT lung cavitary lesion and nodular pleural thickening at the LEFT lung base all with minimal metabolic activity. Findings are not typical of bronchogenic carcinoma. 3. Prostate gland is enlarged with heterogeneous metabolic activity. There is enlarged LEFT external iliac lymph node. Combination of findings are concerning for prostate carcinoma with skeletal metastasis and potentially lung metastasis. Recommend correlation with PSA. Electronically Signed   By: Suzy Bouchard M.D.   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  November 2021 PET scan-multiple lung lesions multiple bone lesions and prostate uptake.  Will discuss with urology the need for biopsy/NGS.  We will also review tumor conference.  #Long discussion with patient and daughter regarding the above diagnosis; stage of cancer.  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  However does have good treatment options; survival in the order of years.     Again discussed at length the importance of starting ADT-to treat his progressive prostate cancer.  I reviewed the mechanism of action of ADT/blocking testosterone.  Again reviewed the  potential side effects including but not limited to-fatigue hot flashes loss of libido.  Also reviewed that bone health/cardiovascular as long-term complications.  I would recommend Firmagon.  Patient and daughter understand treatments are palliative not curative. Discussed treatments are in general indefinite.   #Since patient is castrate sensitive and bulky disease-candidate for Taxotere/Zytiga.  We will discuss further.  # DISPOSITION: # Firmagon injection next week- NEW # Follow up in 2 weeks-MD; No labs- Dr.B  # I reviewed the blood work- with the patient in detail; also reviewed the imaging independently [as summarized above]; and with the patient in detail.   # 40 minutes face-to-face with the patient discussing the above plan of care; more than 50% of time spent on prognosis/ natural history; counseling and coordination.   All questions were answered. The patient knows to call the clinic with any problems, questions or concerns.    Cammie Sickle, MD 02/06/2020 7:54 AM

## 2020-02-06 ENCOUNTER — Inpatient Hospital Stay: Payer: Medicare Other | Attending: Internal Medicine | Admitting: Hospice and Palliative Medicine

## 2020-02-06 ENCOUNTER — Other Ambulatory Visit: Payer: Self-pay

## 2020-02-06 DIAGNOSIS — E119 Type 2 diabetes mellitus without complications: Secondary | ICD-10-CM | POA: Insufficient documentation

## 2020-02-06 DIAGNOSIS — F17210 Nicotine dependence, cigarettes, uncomplicated: Secondary | ICD-10-CM | POA: Insufficient documentation

## 2020-02-06 DIAGNOSIS — Z7982 Long term (current) use of aspirin: Secondary | ICD-10-CM | POA: Insufficient documentation

## 2020-02-06 DIAGNOSIS — C61 Malignant neoplasm of prostate: Secondary | ICD-10-CM | POA: Insufficient documentation

## 2020-02-06 DIAGNOSIS — C7951 Secondary malignant neoplasm of bone: Secondary | ICD-10-CM | POA: Insufficient documentation

## 2020-02-06 DIAGNOSIS — C7800 Secondary malignant neoplasm of unspecified lung: Secondary | ICD-10-CM | POA: Insufficient documentation

## 2020-02-06 DIAGNOSIS — Z5111 Encounter for antineoplastic chemotherapy: Secondary | ICD-10-CM | POA: Insufficient documentation

## 2020-02-06 DIAGNOSIS — Z79899 Other long term (current) drug therapy: Secondary | ICD-10-CM | POA: Insufficient documentation

## 2020-02-06 DIAGNOSIS — I10 Essential (primary) hypertension: Secondary | ICD-10-CM | POA: Insufficient documentation

## 2020-02-06 NOTE — Progress Notes (Signed)
Multidisciplinary Oncology Council Documentation  Brent Glass was presented by our [facility] on 02/06/2020, which included representatives from:  . Palliative Care . Dietitian . Physical/Occupational Therapist . Speech Therapist . Survivorship Nurse . Nurse Navigator . [employer] currently presents with history of stage IV prostate cancer  We reviewed previous medical and familial history, history of present illness, and recent lab results along with all available histopathologic and imaging studies. The [facility] considered available treatment options and made the following recommendations/referrals: Orders Placed This Encounter  Procedures  . Ambulatory referral to Genetics  . Ambulatory Referral to Palliative Care  . Amb Referral to Nutrition and Diabetic Education     The MOC is a meeting of clinicians from various specialty areas who evaluate and discuss patients for whom a multidisciplinary approach is being considered. Final determinations in the plan of care are those of the provider(s).   Today's extended care, comprehensive team conference, Brent Glass was not present for the discussion and was not examined.

## 2020-02-07 ENCOUNTER — Other Ambulatory Visit: Payer: Medicare Other

## 2020-02-08 NOTE — Progress Notes (Signed)
Tumor Board Documentation  Brent Glass was presented by Dr Rogue Bussing at our Tumor Board on 02/07/2020, which included representatives from medical oncology, radiation oncology, internal medicine, navigation, pathology, radiology, surgical, pharmacy, genetics, research, palliative care, pulmonology.  Brent Glass currently presents as a new patient, for discussion with history of the following treatments: active survellience.  Additionally, we reviewed previous medical and familial history, history of present illness, and recent lab results along with all available histopathologic and imaging studies. The tumor board considered available treatment options and made the following recommendations: Biopsy, Additional screening (Prostate, Send path for NGS testing)    The following procedures/referrals were also placed: No orders of the defined types were placed in this encounter.   Clinical Trial Status: not discussed   Staging used: Not Applicable  National site-specific guidelines   were discussed with respect to the case.  Tumor board is a meeting of clinicians from various specialty areas who evaluate and discuss patients for whom a multidisciplinary approach is being considered. Final determinations in the plan of care are those of the provider(s). The responsibility for follow up of recommendations given during tumor board is that of the provider.   Today's extended care, comprehensive team conference, [place] was not present for the discussion and was not examined.   Multidisciplinary Tumor Board is a multidisciplinary case peer review process.  Decisions discussed in the Multidisciplinary Tumor Board reflect the opinions of the specialists present at the conference without having examined the patient.  Ultimately, treatment and diagnostic decisions rest with the primary provider(s) and the patient.

## 2020-02-11 ENCOUNTER — Other Ambulatory Visit: Payer: Self-pay

## 2020-02-11 ENCOUNTER — Inpatient Hospital Stay: Payer: Medicare Other

## 2020-02-11 VITALS — BP 110/66 | HR 84 | Resp 16

## 2020-02-11 DIAGNOSIS — E119 Type 2 diabetes mellitus without complications: Secondary | ICD-10-CM | POA: Diagnosis not present

## 2020-02-11 DIAGNOSIS — F17210 Nicotine dependence, cigarettes, uncomplicated: Secondary | ICD-10-CM | POA: Diagnosis not present

## 2020-02-11 DIAGNOSIS — Z7982 Long term (current) use of aspirin: Secondary | ICD-10-CM | POA: Diagnosis not present

## 2020-02-11 DIAGNOSIS — C7800 Secondary malignant neoplasm of unspecified lung: Secondary | ICD-10-CM | POA: Diagnosis not present

## 2020-02-11 DIAGNOSIS — Z5111 Encounter for antineoplastic chemotherapy: Secondary | ICD-10-CM | POA: Diagnosis present

## 2020-02-11 DIAGNOSIS — C61 Malignant neoplasm of prostate: Secondary | ICD-10-CM | POA: Diagnosis present

## 2020-02-11 DIAGNOSIS — I10 Essential (primary) hypertension: Secondary | ICD-10-CM | POA: Diagnosis not present

## 2020-02-11 DIAGNOSIS — C7951 Secondary malignant neoplasm of bone: Secondary | ICD-10-CM | POA: Diagnosis present

## 2020-02-11 DIAGNOSIS — Z79899 Other long term (current) drug therapy: Secondary | ICD-10-CM | POA: Diagnosis not present

## 2020-02-11 MED ORDER — DEGARELIX ACETATE(240 MG DOSE) 120 MG/VIAL ~~LOC~~ SOLR
240.0000 mg | Freq: Once | SUBCUTANEOUS | Status: AC
  Administered 2020-02-11: 240 mg via SUBCUTANEOUS
  Filled 2020-02-11: qty 6

## 2020-02-20 ENCOUNTER — Encounter: Payer: Self-pay | Admitting: Internal Medicine

## 2020-02-20 ENCOUNTER — Inpatient Hospital Stay (HOSPITAL_BASED_OUTPATIENT_CLINIC_OR_DEPARTMENT_OTHER): Payer: Medicare Other | Admitting: Internal Medicine

## 2020-02-20 ENCOUNTER — Telehealth: Payer: Self-pay | Admitting: Urology

## 2020-02-20 ENCOUNTER — Other Ambulatory Visit: Payer: Self-pay

## 2020-02-20 ENCOUNTER — Telehealth: Payer: Self-pay | Admitting: Pharmacy Technician

## 2020-02-20 ENCOUNTER — Telehealth: Payer: Self-pay | Admitting: Pharmacist

## 2020-02-20 VITALS — BP 100/55 | HR 79 | Temp 97.3°F | Resp 20 | Ht 72.0 in | Wt 208.0 lb

## 2020-02-20 DIAGNOSIS — C61 Malignant neoplasm of prostate: Secondary | ICD-10-CM

## 2020-02-20 DIAGNOSIS — Z5111 Encounter for antineoplastic chemotherapy: Secondary | ICD-10-CM | POA: Diagnosis not present

## 2020-02-20 MED ORDER — ABIRATERONE ACETATE 250 MG PO TABS: 1000.0000 mg | ORAL_TABLET | Freq: Every day | ORAL | 0 refills | Status: DC

## 2020-02-20 MED ORDER — PREDNISONE 5 MG PO TABS: 5.0000 mg | ORAL_TABLET | Freq: Two times a day (BID) | ORAL | 3 refills | Status: DC

## 2020-02-20 MED ORDER — PREDNISONE 5 MG PO TABS: 5.0000 mg | ORAL_TABLET | Freq: Every day | ORAL | 3 refills | Status: DC

## 2020-02-20 NOTE — Progress Notes (Signed)
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   # NOV 2021- CTA [ER] Diffuse bilateral pulmonary nodules including a cavitary lesion in the right upper lobe measuring approximately 2 cm. There is extensive nodular pleural thickening bilaterally, greatest in the left lower lobe.  November 2021 PET scan-multiple lung lesions multiple bone lesions and prostate uptake.    # DM-2; NO COPD [?]; ACTIVE SMOKER 65ppd.  # DEC 2nd week- FIRMAGON.   # NGS/MOLECULAR TESTS:    # PALLIATIVE CARE EVALUATION:  # PAIN MANAGEMENT:    DIAGNOSIS: Prostate cancer  STAGE:   IV      ;  GOALS: Palliative  CURRENT/MOST RECENT THERAPY : Mills Koller    Prostate cancer metastatic to multiple sites [facility])  02/04/2020 Initial Diagnosis   Prostate cancer metastatic to multiple sites [facility])      HISTORY OF PRESENTING ILLNESS:  Brent Glass 80 y.o.  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  Patient is on Firmagon.  In the interim patient had [place] shots 2 weeks ago.  Patient noted to have mild right breast pain.  Denies any significant hot flashes.  Otherwise no nausea no vomiting.  Review of Systems  Constitutional: Positive for weight loss. Negative for chills, diaphoresis, fever and malaise/fatigue.  HENT: Negative for nosebleeds and sore throat.   Eyes: Negative for double vision.  Respiratory: Positive for cough. Negative for hemoptysis, sputum production, shortness of breath and wheezing.   Cardiovascular: Negative for chest pain, palpitations, orthopnea and leg swelling.  Gastrointestinal: Negative for abdominal pain, blood in  stool, constipation, diarrhea, heartburn, melena, nausea and vomiting.  Genitourinary: Negative for dysuria, frequency and urgency.  Musculoskeletal: Positive for back pain. Negative for joint pain.  Skin: Negative.  Negative for itching and rash.  Neurological: Negative for dizziness, tingling, focal weakness, weakness and headaches.  Endo/Heme/Allergies: Does not bruise/bleed easily.  Psychiatric/Behavioral: Negative for depression. The patient is not nervous/anxious and does not have insomnia.      MEDICAL HISTORY:  Past Medical History:  Diagnosis Date   Arthritis    BPH (benign prostatic hyperplasia)    BPH with obstruction/lower urinary tract symptoms    Diabetes (HCC)    Disorder resulting from impaired renal function    Elevated PSA    Heart murmur    Hypertension    Knee pain    Lesion of lung    Low HDL (under 40)    Obesity    Urinary hesitancy     SURGICAL HISTORY: Past Surgical History:  Procedure Laterality Date   HERNIA REPAIR  3810   Umbilical Hernia Repair   KNEE ARTHROSCOPY Left 04/10/2015   Procedure: ARTHROSCOPY KNEE, partial medial meniscectomy, partial synovectomy;  Surgeon: Hessie Knows, MD;  Location: [facility] ORS;  Service: Orthopedics;  Laterality: Left;    SOCIAL HISTORY: Social History   Socioeconomic History   Marital status: Widowed    Spouse name: Not on file   Number of children: Not on file   Years of education: Not on file   Highest education level: Not on file  Occupational History   Not on file  Tobacco Use   Smoking status: Current  Every Day Smoker    Packs/day: 1.00    Types: Cigarettes   Smokeless tobacco: Never Used  Vaping Use   Vaping Use: Never used  Substance and Sexual Activity   Alcohol use: No    Alcohol/week: 0.0 standard drinks   Drug use: No   Sexual activity: Not Currently  Other Topics Concern   Not on file  Social History Narrative   Lives in [place]; self; daughter lives 1 mile.  Smoke 1ppd > 65 years; stopped 25 years ago.  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    Social Determinants of Health   Financial Resource Strain: Not on file  Food Insecurity: Not on file  Transportation Needs: Not on file  Physical Activity: Not on file  Stress: Not on file  Social Connections: Not on file  Intimate Partner Violence: Not on file    FAMILY HISTORY: Family History  Problem Relation Age of Onset   Diabetes Mother    Lymphoma Child        died in 73.    Kidney disease Neg Hx    Prostate cancer Neg Hx     ALLERGIES:  has No Known Allergies.  MEDICATIONS:  Current Outpatient Medications  Medication Sig Dispense Refill   abiraterone acetate (ZYTIGA) 250 MG tablet Take 4 tablets (1,000 mg total) by mouth daily. Take on an empty stomach 1 hour before or 2 hours after a meal 120 tablet 0   albuterol (PROVENTIL HFA;VENTOLIN HFA) 108 (90 BASE) MCG/ACT inhaler Inhale 2 puffs into the lungs every 6 (six) hours as needed for wheezing or shortness of breath.     aspirin 81 MG tablet Take 81 mg by mouth daily.     finasteride (PROSCAR) 5 MG tablet TAKE 1 TABLET BY MOUTH EVERY DAY 90 tablet 3   glipiZIDE (GLUCOTROL XL) 10 MG 24 hr tablet 1 BY MOUTH DAILY FOR DIABETES  2   HYDROcodone-acetaminophen (NORCO) 5-325 MG tablet Take 1 tablet by mouth every 6 (six) hours as needed for moderate pain. 30 tablet 0   lisinopril-hydrochlorothiazide (PRINZIDE,ZESTORETIC) 20-25 MG per tablet Take 1 tablet by mouth daily.     meloxicam (MOBIC) 15 MG tablet Take 1 tablet (15 mg total) by mouth daily. 14 tablet 0   metFORMIN (GLUCOPHAGE) 1000 MG tablet Take 1,000 mg by mouth 2 (two) times daily with a meal.     predniSONE (DELTASONE) 5 MG tablet Take 1 tablet (5 mg total) by mouth daily with breakfast. 30 tablet 3   simvastatin (ZOCOR) 40 MG tablet Take 40 mg by mouth daily.     No current facility-administered medications for this visit.       Marland Kitchen  PHYSICAL EXAMINATION: ECOG PERFORMANCE STATUS: 0 - Asymptomatic  Vitals:   02/20/20 1019  BP: (!) 100/55  Pulse: 79  Resp: 20  Temp: (!) 97.3 F (36.3 C)   Filed Weights   02/20/20 1019  Weight: 208 lb (94.3 kg)    Physical Exam HENT:     Head: Normocephalic and atraumatic.     Mouth/Throat:     Pharynx: No oropharyngeal exudate.  Eyes:     Pupils: Pupils are equal, round, and reactive to light.  Cardiovascular:     Rate and Rhythm: Normal rate and regular rhythm.  Pulmonary:     Effort: No respiratory distress.     Breath sounds: No wheezing.     Comments: Decreased breath sounds bilaterally. Abdominal:     General: Bowel sounds are  normal. There is no distension.     Palpations: Abdomen is soft. There is no mass.     Tenderness: There is no abdominal tenderness. There is no guarding or rebound.  Musculoskeletal:        General: No tenderness. Normal range of motion.     Cervical back: Normal range of motion and neck supple.  Skin:    General: Skin is warm.  Neurological:     Mental Status: He is alert and oriented to person, place, and time.  Psychiatric:        Mood and Affect: Affect normal.      LABORATORY DATA:  I have reviewed the data as listed Lab Results  Component Value Date   WBC 11.4 (H) 01/23/2020   HGB 13.3 01/23/2020   HCT 39.7 01/23/2020   MCV 84.6 01/23/2020   PLT 509 (H) 01/23/2020   Recent Labs    01/16/20 2036  NA 138  K 3.9  CL 103  CO2 22  GLUCOSE 97  BUN 16  CREATININE 1.23  CALCIUM 9.4  GFRNONAA 59*  PROT 7.6  ALBUMIN 3.8  AST 13*  ALT 12  ALKPHOS 120  BILITOT 0.7    RADIOGRAPHIC STUDIES: I have personally reviewed the radiological images as listed and agreed with the findings in the report. No results found.  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  November 2021 PET  scan-multiple lung lesions multiple bone lesions and prostate uptake; discussed at tumor conference; recommend prostate biopsy.  Discussed with urology; Ms.McGowan.  #Currently on ADT, Firmagon tolerating well except for mild breast tenderness.  Monitor for now  #Discussed given the castrate sensitive metastatic disease-discussed option of docetaxel chemotherapy/Zytiga.  Discussed the pros and cons of each therapy [side effects of chemotherapy; however 4 months vs. Zytiga-better side effect profile compared to chemo; but infinite therapy with prednisone.].  Patient/family interested in [place].  We will start the prescription.  Discussed with pharmacy/social work.  #Reviewed the side effects of Zytiga including but not limited to leg swelling hypokalemia; need for prednisone [patient is a diabetic-we will monitor sugars]; LFT elevation etc.  Understands treatments are palliative not curative.  We will plan to start at next visit.  # DISPOSITION: # Follow up in 2nd week of Jan 2022- MD; cbc/cmp/PSA; Mills Koller-- Dr.B    All questions were answered. The patient knows to call the clinic with any problems, questions or concerns.    Cammie Sickle, MD 02/26/2020 11:48 PM

## 2020-02-20 NOTE — Telephone Encounter (Signed)
Oral Oncology Patient Advocate Encounter  Received notification from [employer] that prior authorization for Zytiga (Abiraterone) is required.  PA submitted on CoverMyMeds Key [id number]  Status is pending  Oral Oncology Clinic will continue to follow.  [place] Patient [place] Phone [phone] Fax [phone] 02/20/2020 12:06 PM

## 2020-02-20 NOTE — Telephone Encounter (Signed)
Would you call Mr. Traywick and have him schedule a virtual visit so we discuss and get him scheduled for a prostate biopsy?  We need to do this pretty quickly, but he is on ASA according to his chart.  So, we need to get him cleared to come off that medication.

## 2020-02-20 NOTE — Telephone Encounter (Signed)
Oral Oncology Patient Advocate Encounter  Prior Authorization for Brent Glass has been approved.    PA# [id number] Effective dates: 02/20/20 through 03/07/21  Patients co-pay is $295.21.    Oral Oncology Clinic will continue to follow.   [name] [name] Phone [phone] Fax [phone] 02/20/2020 12:08 PM

## 2020-02-20 NOTE — Telephone Encounter (Signed)
Oral Oncology Pharmacist Encounter  Received new prescription for Zytiga (abiraterone) for the treatment of metastatic castration sensitive prostate cancer in conjunction with prednisone and ADT therapy, planned duration until disease progression or unacceptable drug toxicity. Planned start 03/19/20   CMP from 01/16/20 assessed, no relevant lab abnormalities. Prescription dose and frequency assessed.   Current medication list in Epic reviewed, no DDIs with abiraterone identified.  Evaluated chart and no patient barriers to medication adherence identified.   Prescription has been e-scribed to the [facility] for benefits analysis and approval.  Oral Oncology Clinic will continue to follow for insurance authorization, copayment issues, initial counseling and start date.  Darl Pikes, PharmD, BCPS, BCOP, CPP Hematology/Oncology Clinical Pharmacist Practitioner [facility]/HP/AP [facility] [phone]  02/20/2020 2:24 PM

## 2020-02-20 NOTE — Assessment & Plan Note (Addendum)
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  November 2021 PET scan-multiple lung lesions multiple bone lesions and prostate uptake; discussed at tumor conference; recommend prostate biopsy.  Discussed with urology; Ms.McGowan.  #Currently on ADT, Firmagon tolerating well except for mild breast tenderness.  Monitor for now  #Discussed given the castrate sensitive metastatic disease-discussed option of docetaxel chemotherapy/Zytiga.  Discussed the pros and cons of each therapy [side effects of chemotherapy; however 4 months vs. Zytiga-better side effect profile compared to chemo; but infinite therapy with prednisone.].  Patient/family interested in [place].  We will start the prescription.  Discussed with pharmacy/social work.  #Reviewed the side effects of Zytiga including but not limited to leg swelling hypokalemia; need for prednisone [patient is a diabetic-we will monitor sugars]; LFT elevation etc.  Understands treatments are palliative not curative.  We will plan to start at next visit.  # DISPOSITION: # Follow up in 2nd week of Jan 2022- MD; cbc/cmp/PSA; Mills Koller-- Dr.B

## 2020-02-20 NOTE — Progress Notes (Signed)
Patient has had 3 covid vaccines. Daughter to bring in the injection documentation cards at next visit.

## 2020-02-22 ENCOUNTER — Telehealth: Payer: Self-pay | Admitting: *Deleted

## 2020-02-22 ENCOUNTER — Telehealth: Payer: Self-pay | Admitting: Pharmacy Technician

## 2020-02-22 NOTE — Telephone Encounter (Addendum)
Attempted to reach patient's daughter x 3. FMLA ready to be picked up. No answer. Unable to leave vm.

## 2020-02-22 NOTE — Telephone Encounter (Signed)
Starting 2nd week in January.  Application sent for JJPAF.

## 2020-03-06 NOTE — Telephone Encounter (Signed)
Oral Oncology Patient Advocate Encounter  Called to check application status on 12/27 and rep stated that application was still pending and had not been processed.  Rep stated that she would put in queue for processing and to check back in 4-5 days.    I called back today, 12/30, and rep stated that the documents had been mislabeled and that is why the application had not been processed.  She submitted application to a queue for same day processing and said I should have an answer by Monday 03/10/20 due to the company being closed tomorrow for New Years Eve.  I will follow back up with JJPAF on Tuesday if I have not received any notification.  Daine Floras CPHT Specialty Pharmacy Patient Advocate [facility] Phone [phone] Fax [phone] 03/06/2020 3:03 PM

## 2020-03-06 NOTE — Telephone Encounter (Signed)
Oral Oncology Patient Advocate Encounter  Met patient after appt on 14/60/47 to complete application for [place]etta Emery and [place] (JJPAF) in an effort to reduce patient's out of pocket expense for Zytiga to $0.    Application completed and faxed to [phone].   [place] (JJ[place]) phone number for follow up is [phone].   This encounter will be updated until final determination.   [place] Patient [place] Phone [phone] Fax [phone]

## 2020-03-10 ENCOUNTER — Encounter: Payer: Self-pay | Admitting: Licensed Clinical Social Worker

## 2020-03-10 ENCOUNTER — Inpatient Hospital Stay: Payer: Medicare Other | Admitting: Hospice and Palliative Medicine

## 2020-03-10 ENCOUNTER — Inpatient Hospital Stay (HOSPITAL_BASED_OUTPATIENT_CLINIC_OR_DEPARTMENT_OTHER): Payer: Medicare Other | Admitting: Licensed Clinical Social Worker

## 2020-03-10 ENCOUNTER — Inpatient Hospital Stay: Payer: Medicare Other | Admitting: Pharmacist

## 2020-03-10 ENCOUNTER — Inpatient Hospital Stay: Payer: Medicare Other | Attending: Internal Medicine

## 2020-03-10 ENCOUNTER — Inpatient Hospital Stay: Payer: Medicare Other

## 2020-03-10 DIAGNOSIS — C7951 Secondary malignant neoplasm of bone: Secondary | ICD-10-CM | POA: Diagnosis not present

## 2020-03-10 DIAGNOSIS — Z7984 Long term (current) use of oral hypoglycemic drugs: Secondary | ICD-10-CM | POA: Insufficient documentation

## 2020-03-10 DIAGNOSIS — Z807 Family history of other malignant neoplasms of lymphoid, hematopoietic and related tissues: Secondary | ICD-10-CM | POA: Insufficient documentation

## 2020-03-10 DIAGNOSIS — C61 Malignant neoplasm of prostate: Secondary | ICD-10-CM

## 2020-03-10 DIAGNOSIS — E119 Type 2 diabetes mellitus without complications: Secondary | ICD-10-CM | POA: Diagnosis not present

## 2020-03-10 DIAGNOSIS — E669 Obesity, unspecified: Secondary | ICD-10-CM | POA: Insufficient documentation

## 2020-03-10 DIAGNOSIS — Z801 Family history of malignant neoplasm of trachea, bronchus and lung: Secondary | ICD-10-CM | POA: Insufficient documentation

## 2020-03-10 DIAGNOSIS — Z7952 Long term (current) use of systemic steroids: Secondary | ICD-10-CM | POA: Insufficient documentation

## 2020-03-10 DIAGNOSIS — Z7982 Long term (current) use of aspirin: Secondary | ICD-10-CM | POA: Diagnosis not present

## 2020-03-10 DIAGNOSIS — F17210 Nicotine dependence, cigarettes, uncomplicated: Secondary | ICD-10-CM | POA: Diagnosis not present

## 2020-03-10 DIAGNOSIS — I10 Essential (primary) hypertension: Secondary | ICD-10-CM | POA: Diagnosis not present

## 2020-03-10 DIAGNOSIS — Z5111 Encounter for antineoplastic chemotherapy: Secondary | ICD-10-CM | POA: Insufficient documentation

## 2020-03-10 DIAGNOSIS — Z79899 Other long term (current) drug therapy: Secondary | ICD-10-CM | POA: Diagnosis not present

## 2020-03-10 DIAGNOSIS — Z833 Family history of diabetes mellitus: Secondary | ICD-10-CM | POA: Diagnosis not present

## 2020-03-10 DIAGNOSIS — C7800 Secondary malignant neoplasm of unspecified lung: Secondary | ICD-10-CM | POA: Diagnosis present

## 2020-03-10 DIAGNOSIS — Z7189 Other specified counseling: Secondary | ICD-10-CM

## 2020-03-10 LAB — COMPREHENSIVE METABOLIC PANEL
ALT: 16 U/L (ref 0–44)
AST: 15 U/L (ref 15–41)
Albumin: 4 g/dL (ref 3.5–5.0)
Alkaline Phosphatase: 317 U/L — ABNORMAL HIGH (ref 38–126)
Anion gap: 12 (ref 5–15)
BUN: 18 mg/dL (ref 8–23)
CO2: 24 mmol/L (ref 22–32)
Calcium: 9.3 mg/dL (ref 8.9–10.3)
Chloride: 102 mmol/L (ref 98–111)
Creatinine, Ser: 1.1 mg/dL (ref 0.61–1.24)
GFR, Estimated: >60 mL/min (ref >60)
Glucose, Bld: 100 mg/dL — ABNORMAL HIGH (ref 70–99)
Potassium: 4.2 mmol/L (ref 3.5–5.1)
Sodium: 138 mmol/L (ref 135–145)
Total Bilirubin: 0.5 mg/dL (ref 0.3–1.2)
Total Protein: 6.9 g/dL (ref 6.5–8.1)

## 2020-03-10 LAB — CBC WITH DIFFERENTIAL/PLATELET
Abs Immature Granulocytes: 0.03 10*3/uL (ref 0.00–0.07)
Basophils Absolute: 0.1 10*3/uL (ref 0.0–0.1)
Basophils Relative: 1 %
Eosinophils Absolute: 0.4 10*3/uL (ref 0.0–0.5)
Eosinophils Relative: 4 %
HCT: 37.6 % — ABNORMAL LOW (ref 39.0–52.0)
Hemoglobin: 12.5 g/dL — ABNORMAL LOW (ref 13.0–17.0)
Immature Granulocytes: 0 %
Lymphocytes Relative: 33 %
Lymphs Abs: 3.3 10*3/uL (ref 0.7–4.0)
MCH: 28.9 pg (ref 26.0–34.0)
MCHC: 33.2 g/dL (ref 30.0–36.0)
MCV: 87 fL (ref 80.0–100.0)
Monocytes Absolute: 0.7 10*3/uL (ref 0.1–1.0)
Monocytes Relative: 7 %
Neutro Abs: 5.6 10*3/uL (ref 1.7–7.7)
Neutrophils Relative %: 55 %
Platelets: 309 10*3/uL (ref 150–400)
RBC: 4.32 MIL/uL (ref 4.22–5.81)
RDW: 15.9 % — ABNORMAL HIGH (ref 11.5–15.5)
WBC: 10.1 10*3/uL (ref 4.0–10.5)
nRBC: 0 % (ref 0.0–0.2)

## 2020-03-10 LAB — PSA: Prostatic Specific Antigen: 34.69 ng/mL — ABNORMAL HIGH (ref 0.00–4.00)

## 2020-03-10 NOTE — Progress Notes (Signed)
REFERRING PROVIDER: Cammie Sickle, MD [place],   [id number]  PRIMARY PROVIDER:  Donnie Coffin, MD  PRIMARY REASON FOR VISIT:  1. Prostate cancer metastatic to multiple sites [facility])   2. Family history of lung cancer   3. Family history of lymphoma      HISTORY OF PRESENT ILLNESS:   Brent Glass, a 80 y.o. male, was seen for a [place] cancer genetics consultation at the request of Dr. Rogue Bussing due to a personal and family history of cancer.  Brent Glass presents to clinic today to discuss the possibility of a hereditary predisposition to cancer, genetic testing, and to further clarify his future cancer risks, as well as potential cancer risks for family members.   In 2021, at the age of 58, Brent Glass was diagnosed with metastatic prostate cancer. He is currently being treated with [place]. He does report history of several colonoscopies, possibly had about 10 colon polyps or less.   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   # NOV 2021- CTA [ER] Diffuse bilateral pulmonary nodules including a cavitary lesion in the right upper lobe measuring approximately 2 cm. There is extensive nodular pleural thickening bilaterally, greatest in the left lower lobe.  November 2021 PET scan-multiple lung lesions multiple bone lesions and prostate uptake.    # DM-2; NO COPD [?]; ACTIVE SMOKER 65ppd.  # DEC 2nd week- FIRMAGON.   # NGS/MOLECULAR TESTS:    # PALLIATIVE CARE EVALUATION:  # PAIN MANAGEMENT:    DIAGNOSIS: Prostate cancer  STAGE:   IV      ;  GOALS: Palliative  CURRENT/MOST RECENT THERAPY : Mills Koller    Prostate cancer metastatic to multiple sites [facility])  02/04/2020 Initial Diagnosis   Prostate cancer metastatic to multiple sites [facility])      Past Medical History:  Diagnosis Date  . Arthritis   . BPH (benign prostatic hyperplasia)   . BPH with  obstruction/lower urinary tract symptoms   . Diabetes ([place])   . Disorder resulting from impaired renal function   . Elevated PSA   . Family history of lung cancer   . Family history of lymphoma   . Heart murmur   . Hypertension   . Knee pain   . Lesion of lung   . Low HDL (under 40)   . Obesity   . Urinary hesitancy     Past Surgical History:  Procedure Laterality Date  . HERNIA REPAIR  3212   Umbilical Hernia Repair  . KNEE ARTHROSCOPY Left 04/10/2015   Procedure: ARTHROSCOPY KNEE, partial medial meniscectomy, partial synovectomy;  Surgeon: Hessie Knows, MD;  Location: [facility] ORS;  Service: Orthopedics;  Laterality: Left;    Social History   Socioeconomic History  . Marital status: Widowed    Spouse name: Not on file  . Number of children: Not on file  . Years of education: Not on file  . Highest education level: Not on file  Occupational History  . Not on file  Tobacco Use  . Smoking status: Current Every Day Smoker    Packs/day: 1.00    Types: Cigarettes  . Smokeless tobacco: Never Used  Vaping Use  . Vaping Use: Never used  Substance and Sexual Activity  . Alcohol use: No    Alcohol/week: 0.0 standard drinks  . Drug use: No  . Sexual activity: Not Currently  Other Topics Concern  .  Not on file  Social History Narrative   Lives in [place]; self; daughter lives 1 mile. Smoke 1ppd > 64 years; stopped 25 years ago.  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    Social Determinants of Health   Financial Resource Strain: Not on file  Food Insecurity: Not on file  Transportation Needs: Not on file  Physical Activity: Not on file  Stress: Not on file  Social Connections: Not on file     FAMILY HISTORY:  We obtained a detailed, 4-generation family history.  Significant diagnoses are listed below: Family History  Problem Relation Age of Onset  . Diabetes Mother   . Lung cancer Mother   . Lymphoma Son 28       died in 58.   . Kidney  disease Neg Hx   . Prostate cancer Neg Hx    Brent Glass had 3 sons and 1 daughter. One of his sons had lymphoma and died at 63. He has 1 maternal half brother and 2 maternal half sisters, no history of cancer.  Brent Glass mother died of lung cancer at 85, she did have history of smoking. No other known cancers on this side of the family.   Brent Glass does not have information about his paternal side of the family, but is unaware of any cancers.   Brent Glass is unaware of previous family history of genetic testing for hereditary cancer risks. Patient's maternal ancestors are of unknown descent, and paternal ancestors are of unknown descent. There is no reported Ashkenazi Jewish ancestry. There is no known consanguinity.    GENETIC COUNSELING ASSESSMENT: Brent Glass is a 80 y.o. male with a personal history of metastatic prostate cancer which is somewhat suggestive of a hereditary cancer syndrome and predisposition to cancer. We, therefore, discussed and recommended the following at today's visit.   DISCUSSION: We discussed that approximately 5-10% of prostate cancer is hereditary  Most cases of hereditary prostate cancer are associated with BRCA1/BRCA2 genes, although there are other genes associated with hereditary prostate cancer as well.  We discussed that testing is beneficial for several reasons including knowing if an individual is a candidate for certain targeted therapies,  knowing about other cancer risks, identifying potential screening and risk-reduction options that may be appropriate, and to understand if other family members could be at risk for cancer and allow them to undergo genetic testing.   We reviewed the characteristics, features and inheritance patterns of hereditary cancer syndromes. We also discussed genetic testing, including the appropriate family members to test, the process of testing, insurance coverage and turn-around-time for results. We discussed the implications of  a negative, positive and/or variant of uncertain significant result. We recommended Brent Glass pursue genetic testing for the [facility] DETECT Multi-Cancer gene panel.   The Multi-Cancer Panel offered by [facility] includes sequencing and/or deletion duplication testing of the following 84 genes: AIP, ALK, APC, ATM, AXIN2,BAP1,  BARD1, BLM, BMPR1A, BRCA1, BRCA2, BRIP1, CASR, CDC73, CDH1, CDK4, CDKN1B, CDKN1C, CDKN2A (p14ARF), CDKN2A (p16INK4a), CEBPA, CHEK2, CTNNA1, DICER1, DIS3L2, EGFR (c.2369C>T, p.Thr790Met variant only), EPCAM (Deletion/duplication testing only), FH, FLCN, GATA2, GPC3, GREM1 (Promoter region deletion/duplication testing only), HOXB13 (c.251G>A, p.Gly84Glu), HRAS, KIT, MAX, MEN1, MET, MITF (c.952G>A, p.Glu318Lys variant only), MLH1, MSH2, MSH3, MSH6, MUTYH, NBN, NF1, NF2, NTHL1, PALB2, PDGFRA, PHOX2B, PMS2, POLD1, POLE, POT1, PRKAR1A, PTCH1, PTEN, RAD50, RAD51C, RAD51D, RB1, RECQL4, RET, RUNX1, SDHAF2, SDHA (sequence changes only), SDHB, SDHC, SDHD, SMAD4, SMARCA4, SMARCB1, SMARCE1, STK11, SUFU, TERC, TERT, TMEM127, TP53, TSC1,  TSC2, VHL, WRN and WT1.   Based on Brent Glass personal history of cancer, he meets medical criteria for genetic testing. Testing will be covered at no cost through [employer].  PLAN: After considering the risks, benefits, and limitations, Brent Glass provided informed consent to pursue genetic testing and the blood sample was sent to [facility] for analysis of the Multi-Cancer Panel. Results should be available within approximately 2-3 weeks' time, at which point they will be disclosed by telephone to Brent Glass, as will any additional recommendations warranted by these results. Brent Glass will receive a summary of his genetic counseling visit and a copy of his results once available. This information will also be available in Epic.   Brent Glass questions were answered to his satisfaction today. Our contact information was provided should  additional questions or concerns arise. Thank you for the referral and allowing [place] to share in the care of your patient.   Faith Rogue, MS, [facility] Genetic Counselor [email or web address] Phone: [phone]  The patient was seen for a total of 30 minutes in face-to-face genetic counseling.  Patient's half brother, Brent Glass was present and patient's daughter, Brent Glass was present via telephone. Dr. Grayland Ormond was available for discussion regarding this case.   _______________________________________________________________________ For Office Staff:  Number of people involved in session: 3 Was an Intern/ student involved with case: no

## 2020-03-10 NOTE — Progress Notes (Signed)
Oral Chemotherapy Clinic [facility]  [phone] [phone]  Patient Care Team: Emogene Vidaurri, MD as PCP - General (Family Medicine) Glory Buff, RN as Oncology Nurse Navigator   Name of the patient: Brent Glass  [id number]  1940/08/05   Date of visit: 03/10/20  HPI: Patient is a 80 y.o. male with metastatic castration sensitive prostate cancer. Planned treatment with Zytiga (abiraterone), prednisone, and ADT.   Reason for Consult: Zytiga (abiraterone) oral chemotherapy education.   PAST MEDICAL HISTORY: Past Medical History:  Diagnosis Date  . Arthritis   . BPH (benign prostatic hyperplasia)   . BPH with obstruction/lower urinary tract symptoms   . Diabetes (HCC)   . Disorder resulting from impaired renal function   . Elevated PSA   . Family history of lung cancer   . Family history of lymphoma   . Heart murmur   . Hypertension   . Knee pain   . Lesion of lung   . Low HDL (under 40)   . Obesity   . Urinary hesitancy     PAST SURGICAL HISTORY:  Past Surgical History:  Procedure Laterality Date  . HERNIA REPAIR  2012   Umbilical Hernia Repair  . KNEE ARTHROSCOPY Left 04/10/2015   Procedure: ARTHROSCOPY KNEE, partial medial meniscectomy, partial synovectomy;  Surgeon: Kennedy Bucker, MD;  Location: [facility] ORS;  Service: Orthopedics;  Laterality: Left;    HEMATOLOGY/ONCOLOGY HISTORY:  Oncology History Overview Note  # NOV 2021- prostate cancer metastatic to lung/bone [NOV PSA 900+; no biopsy].   # NOV 2021- CTA [ER] Diffuse bilateral pulmonary nodules including a cavitary lesion in the right upper lobe measuring approximately 2 cm. There is extensive nodular pleural thickening bilaterally, greatest in the left lower lobe.  November 2021 PET scan-multiple lung lesions multiple bone lesions and prostate uptake.    # DM-2; NO COPD [?]; ACTIVE SMOKER 65ppd.  # DEC 2nd week- FIRMAGON.   # NGS/MOLECULAR TESTS:    #  PALLIATIVE CARE EVALUATION:  # PAIN MANAGEMENT:    DIAGNOSIS: Prostate cancer  STAGE:   IV      ;  GOALS: Palliative  CURRENT/MOST RECENT THERAPY : [name]    Prostate cancer metastatic to multiple sites [facility])  02/04/2020 Initial Diagnosis   Prostate cancer metastatic to multiple sites [facility])     ALLERGIES:  has No Known Allergies.  MEDICATIONS:  Current Outpatient Medications  Medication Sig Dispense Refill  . abiraterone acetate (ZYTIGA) 250 MG tablet Take 4 tablets (1,000 mg total) by mouth daily. Take on an empty stomach 1 hour before or 2 hours after a meal 120 tablet 0  . albuterol (PROVENTIL HFA;VENTOLIN HFA) 108 (90 BASE) MCG/ACT inhaler Inhale 2 puffs into the lungs every 6 (six) hours as needed for wheezing or shortness of breath.    Marland Kitchen aspirin 81 MG tablet Take 81 mg by mouth daily.    . finasteride (PROSCAR) 5 MG tablet TAKE 1 TABLET BY MOUTH EVERY DAY 90 tablet 3  . glipiZIDE (GLUCOTROL XL) 10 MG 24 hr tablet 1 BY MOUTH DAILY FOR DIABETES  2  . HYDROcodone-acetaminophen (NORCO) 5-325 MG tablet Take 1 tablet by mouth every 6 (six) hours as needed for moderate pain. 30 tablet 0  . lisinopril-hydrochlorothiazide (PRINZIDE,ZESTORETIC) 20-25 MG per tablet Take 1 tablet by mouth daily.    . meloxicam (MOBIC) 15 MG tablet Take 1 tablet (15 mg total) by mouth daily. 14 tablet 0  . metFORMIN (GLUCOPHAGE) 1000 MG tablet Take  1,000 mg by mouth 2 (two) times daily with a meal.    . predniSONE (DELTASONE) 5 MG tablet Take 1 tablet (5 mg total) by mouth daily with breakfast. 30 tablet 3  . simvastatin (ZOCOR) 40 MG tablet Take 40 mg by mouth daily.     No current facility-administered medications for this visit.    VITAL SIGNS: There were no vitals taken for this visit. There were no vitals filed for this visit.  Estimated body mass index is 28.89 kg/m as calculated from the following:   Height as of 02/20/20: 6' (1.829 m).   Weight as of an earlier encounter on 03/10/20: 96.6  kg (213 lb).  LABS: CBC:    Component Value Date/Time   WBC 11.4 (H) 01/23/2020 1222   HGB 13.3 01/23/2020 1222   HCT 39.7 01/23/2020 1222   PLT 509 (H) 01/23/2020 1222   MCV 84.6 01/23/2020 1222   NEUTROABS 7.4 01/23/2020 1222   LYMPHSABS 2.9 01/23/2020 1222   MONOABS 0.9 01/23/2020 1222   EOSABS 0.2 01/23/2020 1222   BASOSABS 0.1 01/23/2020 1222   Comprehensive Metabolic Panel:    Component Value Date/Time   NA 138 01/16/2020 2036   K 3.9 01/16/2020 2036   CL 103 01/16/2020 2036   CO2 22 01/16/2020 2036   BUN 16 01/16/2020 2036   CREATININE 1.23 01/16/2020 2036   GLUCOSE 97 01/16/2020 2036   CALCIUM 9.4 01/16/2020 2036   AST 13 (L) 01/16/2020 2036   ALT 12 01/16/2020 2036   ALKPHOS 120 01/16/2020 2036   BILITOT 0.7 01/16/2020 2036   PROT 7.6 01/16/2020 2036   ALBUMIN 3.8 01/16/2020 2036    RADIOGRAPHIC STUDIES: No results found.   Assessment and Plan-  Plan to start abiraterone next week on 03/19/20    Patient Education I spoke with patient, his brother Brent Glass, and daughter Brent Glass, via speaker phone) for overview of new oral chemotherapy medication: Zytiga (abiraterone) for the treatment of metastatic castration sensitive prostate cancer in conjunction with prednisone and ADT therapy, planned duration until disease progression or unacceptable drug toxicity.   Pt is doing well. Counseled patient on administration, dosing, side effects, monitoring, drug-food interactions, safe handling, storage, and disposal. Zytiga: Patient will take 4 tablets (1,000 mg total) by mouth daily. Take on an empty stomach 1 hour before or 2 hours after a meal.  He will also take prednisone 1 tablet (5 mg total) by mouth daily with breakfast.  Side effects include but not limited to: HTN and fatigue.    Reviewed with patient importance of keeping a medication schedule and plan for any missed doses.  After discussion with patient no patient barriers to medication adherence  identified.   [name] and family voiced understanding and appreciation. All questions answered. Medication handout provided.  Provided patient with Oral [facility] phone number. Patient knows to call the office with questions or concerns. Oral Chemotherapy Navigation Clinic will continue to follow.  Medication Access Issues: Still awaiting manufacturer assistance approval  Patient expressed understanding and was in agreement with this plan. He also understands that He can call clinic at any time with any questions, concerns, or complaints.   Thank you for allowing me to participate in the care of this very pleasant patient.   Time Total: 15 mins  Visit consisted of counseling and education on dealing with issues of symptom management in the setting of serious and potentially life-threatening illness.Greater than 50%  of this time was spent counseling and coordinating  care related to the above assessment and plan.  Signed by: Darl Pikes, PharmD, BCPS, Salley Slaughter, CPP Hematology/Oncology Clinical Pharmacist Practitioner [facility] [phone]  03/10/2020 2:19 PM

## 2020-03-10 NOTE — Progress Notes (Signed)
Nutrition Assessment   Reason for Assessment:  Weight loss, initially   ASSESSMENT:  80 year old male with new diagnosis of metastatic prostate cancer (bone and lung).  Past medical history of DM, HTN. Patient receiving firmagon and starting zytiga.  Met with patient and brother Juanda Crumble. Daughter [name] was on speaker phone.  Patient reports that appetite is good/normal.  Has never been a breakfast eater.  Yesterday ate 2 pieces [employer] chicken, coleslaw and bisicuit.  Supper last night was 2 jelly biscuits about 10pm.  Has not eaten anything today since waking around 6-7, except for coffee.  Reports laziness for not eating.  Patient lives by hisself. Does prepare meals or eats out.  Denies trouble chewing or swallowing. No nausea or constipation or diarrhea.    Patient does not check blood glucose on a regular basis.  Medications: metformin, glipizide, predinsone   Labs: reviewed   Anthropometrics:   Height: 72 inches Weight: 213 lb today UBW: 215 lb in 01/2020 224 lb in 2019 208 lb 02/20/20 noted. Patient not trying to lose weight BMI: 28 Patient reports unintentional weight loss but has gained some weight back.  NUTRITION DIAGNOSIS: Food and nutrition related knowledge deficit related to new diagnosis of cancer as evidenced by importance of eating well balanced diet and maintaining weight    INTERVENTION:  Discussed importance of nutrition and weight maintenance.   Encouraged good sources of protein at every meal.  Food list of foods high in protein provided. Discussed importance of checking blood glucose on regular basis with addition of predinsone.  As long as appetite is good and weight is stable being mindful about high sugary foods is important. Contact information provided   MONITORING, EVALUATION, GOAL: weight trends, intake   Next Visit: Jan 31 phone f/u unless in clinic  Nora. Zenia Resides, [place], [place] Registered Dietitian [phone] (mobile)

## 2020-03-12 ENCOUNTER — Telehealth: Payer: Self-pay | Admitting: *Deleted

## 2020-03-12 MED ORDER — PREDNISONE 5 MG PO TABS: 5.0000 mg | ORAL_TABLET | Freq: Every day | ORAL | 3 refills | Status: DC

## 2020-03-12 NOTE — Telephone Encounter (Signed)
Boneta Lucks - Please advise. Do you want to see him in [facility] He reported the breast tenderness  r/t Firmagon at the last apt on 02/21/2020. Dr. B stated to continue to monitor for now at that time. He is also on Zytiga.

## 2020-03-12 NOTE — Telephone Encounter (Signed)
Oral Chemotherapy Pharmacist Encounter   Patient approved for free Zytiga through manufacturer assistance. Prescription for prednisone sent to local pharmacy . Patient's daughter notified.  Remi Haggard, PharmD, BCPS, BCOP, CPP Hematology/Oncology Clinical Pharmacist [facility] Oral Chemotherapy Navigation Clinic [phone]  03/12/2020 11:10 AM

## 2020-03-12 NOTE — Telephone Encounter (Signed)
Patient has been temporarily approved for assistance until 05/10/20, pending signed Medicare D attestation.  Will coordinate with patient to have him sign at his next appointment.  Daine Floras CPHT Specialty Pharmacy Patient [facility] Phone [phone] Fax [phone] 03/12/2020 10:28 AM

## 2020-03-12 NOTE — Telephone Encounter (Signed)
Received message from pt's daughter that pt continues to have right breast tenderness related to firmagon injection. Has been taking tylenol without relief. Pt's daughter is asking if there any other medication options for pt to help relieve tenderness.   Please advise.

## 2020-03-13 NOTE — Telephone Encounter (Signed)
Per Brent Glass- patient needs apt in [facility]. I contacted the patient's daughter. Daughter accepted an apt for 8:30 am tomorrow with Brent Shames, NP.

## 2020-03-13 NOTE — Telephone Encounter (Signed)
Lauren or Boneta Lucks, please review and provide recommendations.

## 2020-03-14 ENCOUNTER — Inpatient Hospital Stay (HOSPITAL_BASED_OUTPATIENT_CLINIC_OR_DEPARTMENT_OTHER): Payer: Medicare Other | Admitting: Nurse Practitioner

## 2020-03-14 ENCOUNTER — Other Ambulatory Visit: Payer: Self-pay

## 2020-03-14 VITALS — BP 120/65 | HR 74 | Temp 98.7°F | Resp 20 | Ht 72.0 in | Wt 207.0 lb

## 2020-03-14 DIAGNOSIS — C61 Malignant neoplasm of prostate: Secondary | ICD-10-CM | POA: Diagnosis not present

## 2020-03-14 DIAGNOSIS — T50905A Adverse effect of unspecified drugs, medicaments and biological substances, initial encounter: Secondary | ICD-10-CM

## 2020-03-14 DIAGNOSIS — Z5111 Encounter for antineoplastic chemotherapy: Secondary | ICD-10-CM | POA: Diagnosis not present

## 2020-03-14 MED ORDER — TRAMADOL HCL 50 MG PO TABS
50.0000 mg | ORAL_TABLET | Freq: Four times a day (QID) | ORAL | 0 refills | Status: DC | PRN
Start: 2020-03-14 — End: 2020-03-19

## 2020-03-14 NOTE — Progress Notes (Signed)
Symptom Management [place]  [phone] [phone]  Patient Care Team: Donnie Coffin, [name], RN as Oncology Nurse Navigator   Name of the patient: Brent Glass  [id number]  [birth year]   Date of visit: 03/14/20  Diagnosis-metastatic prostate cancer  Chief complaint/ Reason for visit-breast tenderness  Heme/Onc history:  Oncology History Overview Note  # NOV 2021- prostate cancer metastatic to lung/bone [NOV PSA 900+; no biopsy].   # NOV 2021- CTA [ER] Diffuse bilateral pulmonary nodules including a cavitary lesion in the right upper lobe measuring approximately 2 cm. There is extensive nodular pleural thickening bilaterally, greatest in the left lower lobe.  November 2021 PET scan-multiple lung lesions multiple bone lesions and prostate uptake.    # DM-2; NO COPD [?]; ACTIVE SMOKER 65ppd.  # DEC 2nd week- FIRMAGON.   # NGS/MOLECULAR TESTS:    # PALLIATIVE CARE EVALUATION:  # PAIN MANAGEMENT:    DIAGNOSIS: Prostate cancer  STAGE:   IV      ;  GOALS: Palliative  CURRENT/MOST RECENT THERAPY : Mills Koller    Prostate cancer metastatic to multiple sites [facility])  02/04/2020 Initial Diagnosis   Prostate cancer metastatic to multiple sites [facility])     Interval history-patient is 80 year old male diagnosed with metastatic prostate cancer currently on Firmagon, pending starting Zytiga, who presents to symptom management clinic for complaints of right breast tenderness since starting Firmagon.  He received his first dose of Firmagon on 02/11/2020 and has had intermittent sharp pains that radiate to his back similar to spasms since he received the medication.  Has not noticed any fullness in the breast.  Pain isn't reproducible. No skin abnormalities, nipple discharge.  Has also had associated hot flashes and night sweats.  Says he is wondering if this is a side effect of the medication  and how long it would last.  Has been taking Tylenol and Aleve without improvement in symptoms.  Daughter who accompanies him today also contributes to history.  She says Fabio Asa should arrive today  Review of systems- Review of Systems  Constitutional: Positive for weight loss. Negative for chills, fever and malaise/fatigue.  HENT: Negative for hearing loss, nosebleeds, sore throat and tinnitus.   Eyes: Negative for blurred vision and double vision.  Respiratory: Negative for cough, hemoptysis, shortness of breath and wheezing.   Cardiovascular: Negative for chest pain, palpitations and leg swelling.  Gastrointestinal: Positive for diarrhea (sec to metformin). Negative for abdominal pain, blood in stool, constipation, melena, nausea and vomiting.  Genitourinary: Negative for dysuria and urgency.  Musculoskeletal: Positive for myalgias. Negative for back pain, falls and joint pain.  Skin: Negative for itching and rash.  Neurological: Positive for sensory change. Negative for dizziness, tingling, loss of consciousness, weakness and headaches.  Endo/Heme/Allergies: Negative for environmental allergies. Does not bruise/bleed easily.  Psychiatric/Behavioral: Negative for depression. The patient is not nervous/anxious and does not have insomnia.       No Known Allergies  Past Medical History:  Diagnosis Date  . Arthritis   . BPH (benign prostatic hyperplasia)   . BPH with obstruction/lower urinary tract symptoms   . Diabetes ([place])   . Disorder resulting from impaired renal function   . Elevated PSA   . Family history of lung cancer   . Family history of lymphoma   . Heart murmur   . Hypertension   . Knee pain   . Lesion of lung   .  Low HDL (under 40)   . Obesity   . Urinary hesitancy     Past Surgical History:  Procedure Laterality Date  . HERNIA REPAIR  0000000   Umbilical Hernia Repair  . KNEE ARTHROSCOPY Left 04/10/2015   Procedure: ARTHROSCOPY KNEE, partial medial meniscectomy,  partial synovectomy;  Surgeon: Hessie Knows, MD;  Location: [facility] ORS;  Service: Orthopedics;  Laterality: Left;    Social History   Socioeconomic History  . Marital status: Widowed    Spouse name: Not on file  . Number of children: Not on file  . Years of education: Not on file  . Highest education level: Not on file  Occupational History  . Not on file  Tobacco Use  . Smoking status: Current Every Day Smoker    Packs/day: 1.00    Types: Cigarettes  . Smokeless tobacco: Never Used  Vaping Use  . Vaping Use: Never used  Substance and Sexual Activity  . Alcohol use: No    Alcohol/week: 0.0 standard drinks  . Drug use: No  . Sexual activity: Not Currently  Other Topics Concern  . Not on file  Social History Narrative   Lives in [place]; self; daughter lives 1 mile. Smoke 1ppd > 65 years; stopped 25 years ago.  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    Social Determinants of Health   Financial Resource Strain: Not on file  Food Insecurity: Not on file  Transportation Needs: Not on file  Physical Activity: Not on file  Stress: Not on file  Social Connections: Not on file  Intimate Partner Violence: Not on file    There is no immunization history on file for this patient.   Family History  Problem Relation Age of Onset  . Diabetes Mother   . Lung cancer Mother   . Lymphoma Son 28       died in 71.   . Kidney disease Neg Hx   . Prostate cancer Neg Hx      Current Outpatient Medications:  .  acetaminophen (TYLENOL) 650 MG CR tablet, Take 650 mg by mouth every 8 (eight) hours as needed for pain., Disp: , Rfl:  .  albuterol (PROVENTIL HFA;VENTOLIN HFA) 108 (90 BASE) MCG/ACT inhaler, Inhale 2 puffs into the lungs every 6 (six) hours as needed for wheezing or shortness of breath., Disp: , Rfl:  .  aspirin 81 MG tablet, Take 81 mg by mouth daily., Disp: , Rfl:  .  finasteride (PROSCAR) 5 MG tablet, TAKE 1 TABLET BY MOUTH EVERY DAY, Disp: 90  tablet, Rfl: 3 .  glipiZIDE (GLUCOTROL XL) 10 MG 24 hr tablet, 1 BY MOUTH DAILY FOR DIABETES, Disp: , Rfl: 2 .  lisinopril-hydrochlorothiazide (PRINZIDE,ZESTORETIC) 20-25 MG per tablet, Take 1 tablet by mouth daily., Disp: , Rfl:  .  meloxicam (MOBIC) 15 MG tablet, Take 1 tablet (15 mg total) by mouth daily., Disp: 14 tablet, Rfl: 0 .  metFORMIN (GLUCOPHAGE) 1000 MG tablet, Take 1,000 mg by mouth 2 (two) times daily with a meal., Disp: , Rfl:  .  simvastatin (ZOCOR) 40 MG tablet, Take 40 mg by mouth daily., Disp: , Rfl:  .  abiraterone acetate (ZYTIGA) 250 MG tablet, Take 4 tablets (1,000 mg total) by mouth daily. Take on an empty stomach 1 hour before or 2 hours after a meal (Patient not taking: Reported on 03/14/2020), Disp: 120 tablet, Rfl: 0 .  predniSONE (DELTASONE) 5 MG tablet, Take 1 tablet (5 mg total) by mouth daily with  breakfast. (Patient not taking: Reported on 03/14/2020), Disp: 30 tablet, Rfl: 3  Physical exam:  Vitals:   03/14/20 0830  BP: 120/65  Pulse: 74  Resp: 20  Temp: 98.7 F (37.1 C)  TempSrc: Oral  Weight: 207 lb (93.9 kg)  Height: 6' (1.829 m)   Physical Exam Constitutional:      General: He is not in acute distress.    Comments: Accompanied by daughter, [name]  Eyes:     Conjunctiva/sclera:     Right eye: Right conjunctiva is injected.     Left eye: Left conjunctiva is injected.  Pulmonary:     Effort: Pulmonary effort is normal.  Chest:     Chest wall: No mass, deformity, swelling or tenderness.  Breasts: Breasts are symmetrical.     Right: Normal. No swelling, bleeding, inverted nipple, mass, nipple discharge, skin change or tenderness.     Left: Normal. No skin change or tenderness.      Comments: No bone/rib pain. Pain not reproducible. Breast palpates normally.  Musculoskeletal:        General: No deformity or signs of injury.  Skin:    General: Skin is warm and dry.     Findings: No bruising, erythema, lesion or rash.  Neurological:     Mental  Status: He is alert and oriented to person, place, and time.  Psychiatric:        Mood and Affect: Mood normal.        Behavior: Behavior normal.      CMP Latest Ref Rng & Units 03/10/2020  Glucose 70 - 99 mg/dL 100(H)  BUN 8 - 23 mg/dL 18  Creatinine 0.61 - 1.24 mg/dL 1.10  Sodium 135 - 145 mmol/L 138  Potassium 3.5 - 5.1 mmol/L 4.2  Chloride 98 - 111 mmol/L 102  CO2 22 - 32 mmol/L 24  Calcium 8.9 - 10.3 mg/dL 9.3  Total Protein 6.5 - 8.1 g/dL 6.9  Total Bilirubin 0.3 - 1.2 mg/dL 0.5  Alkaline Phos 38 - 126 U/L 317(H)  AST 15 - 41 U/L 15  ALT 0 - 44 U/L 16   CBC Latest Ref Rng & Units 03/10/2020  WBC 4.0 - 10.5 K/uL 10.1  Hemoglobin 13.0 - 17.0 g/dL 12.5(L)  Hematocrit 39.0 - 52.0 % 37.6(L)  Platelets 150 - 400 K/uL 309    No images are attached to the encounter.  No results found.  Assessment and plan- Patient is a 80 y.o. male diagnosed with castrate sensitive prostate cancer metastatic to lung and bone currently on ADT, Firmagon and Zytiga, who presents to symptom management clinic for complaints of breast tenderness. Symptoms likely secondary to antagonistic effects of firmagon on [facility]. Associated hot flashes. Explained gynecomastia as psosible side effects. Risk vs benefits of firmagon reviewed. Possible that as he continues firmagon, hormone levels will drop and he will have fewer side effects. In the interim, manage pain refractory to tylenol and aleve. Trial tramadol 50 mg q6h prn. Opioid teaching provided. Constipation prophylaxis reviewed. Info added to avs.   Patient to follow up as planned. If symptoms do not improve or worsen in the interim, return to clinic for possible imaging, rule out other etiologies including rib metastasis.    Visit Diagnosis 1. Prostate cancer metastatic to multiple sites [facility])   2. Medication side effect, initial encounter     Patient expressed understanding and was in agreement with this plan. He also understands that He can call clinic at  any time with any  questions, concerns, or complaints.   Thank you for allowing me to participate in the care of this very pleasant patient.   Beckey Rutter, DNP, AGNP-C [place] [facility] [place]

## 2020-03-14 NOTE — Patient Instructions (Signed)
Tramadol tablets What is this medicine? TRAMADOL (TRA ma dole) is a pain reliever. It is used to treat moderate to severe pain in adults. This medicine may be used for other purposes; ask your health care provider or pharmacist if you have questions. COMMON BRAND NAME(S): Ultram What should I tell my health care provider before I take this medicine? They need to know if you have any of these conditions:  brain tumor  depression  drug abuse or addiction  head injury  if you frequently drink alcohol containing drinks  kidney disease or trouble passing urine  liver disease  lung disease, asthma, or breathing problems  seizures or epilepsy  suicidal thoughts, plans, or attempt; a previous suicide attempt by you or a family member  an unusual or allergic reaction to tramadol, codeine, other medicines, foods, dyes, or preservatives  pregnant or trying to get pregnant  breast-feeding How should I use this medicine? Take this medicine by mouth with a full glass of water. Follow the directions on the prescription label. You can take it with or without food. If it upsets your stomach, take it with food. Do not take your medicine more often than directed. A special MedGuide will be given to you by the pharmacist with each prescription and refill. Be sure to read this information carefully each time. Talk to your pediatrician regarding the use of this medicine in children. Special care may be needed. Overdosage: If you think you have taken too much of this medicine contact a poison control center or emergency room at once. NOTE: This medicine is only for you. Do not share this medicine with others. What if I miss a dose? If you miss a dose, take it as soon as you can. If it is almost time for your next dose, take only that dose. Do not take double or extra doses. What may interact with this medicine? Do not take this medication with any of the following medicines:  MAOIs like Carbex,  Eldepryl, Marplan, Nardil, and Parnate This medicine may also interact with the following medications:  alcohol  antihistamines for allergy, cough and cold  certain medicines for anxiety or sleep  certain medicines for depression like amitriptyline, fluoxetine, sertraline  certain medicines for migraine headache like almotriptan, eletriptan, frovatriptan, naratriptan, rizatriptan, sumatriptan, zolmitriptan  certain medicines for seizures like carbamazepine, oxcarbazepine, phenobarbital, primidone  certain medicines that treat or prevent blood clots like warfarin  digoxin  furazolidone  general anesthetics like halothane, isoflurane, methoxyflurane, propofol  linezolid  local anesthetics like lidocaine, pramoxine, tetracaine  medicines that relax muscles for surgery  other narcotic medicines for pain or cough  phenothiazines like chlorpromazine, mesoridazine, prochlorperazine, thioridazine  procarbazine This list may not describe all possible interactions. Give your health care provider a list of all the medicines, herbs, non-prescription drugs, or dietary supplements you use. Also tell them if you smoke, drink alcohol, or use illegal drugs. Some items may interact with your medicine. What should I watch for while using this medicine? Tell your health care provider if your pain does not go away, if it gets worse, or if you have new or a different type of pain. You may develop tolerance to this drug. Tolerance means that you will need a higher dose of the drug for pain relief. Tolerance is normal and is expected if you take this drug for a long time. Do not suddenly stop taking your drug because you may develop a severe reaction. Your body becomes used to the   drug. This does NOT mean you are addicted. Addiction is a behavior related to getting and using a drug for a nonmedical reason. If you have pain, you have a medical reason to take pain drug. Your health care provider will tell  you how much drug to take. If your health care provider wants you to stop the drug, the dose will be slowly lowered over time to avoid any side effects. If you take other drugs that also cause drowsiness like other narcotic pain drugs, benzodiazepines, or other drugs for sleep, you may have more side effects. Give your health care provider a list of all drugs you use. He or she will tell you how much drug to take. Do not take more drug than directed. Get emergency help right away if you have trouble breathing or are unusually tired or sleepy. Talk to your health care provider about naloxone and how to get it. Naloxone is an emergency drug used for an opioid overdose. An overdose can happen if you take too much opioid. It can also happen if an opioid is taken with some other drugs or substances, like alcohol. Know the symptoms of an overdose, like trouble breathing, unusually tired or sleepy, or not being able to respond or wake up. Make sure to tell caregivers and close contacts where it is stored. Make sure they know how to use it. After naloxone is given, you must get emergency help right away. Naloxone is a temporary treatment. Repeat doses may be needed. This drug may cause serious skin reactions. They can happen weeks to months after starting the drug. Contact your health care provider right away if you notice fevers or flu-like symptoms with a rash. The rash may be red or purple and then turn into blisters or peeling of the skin. Or, you might notice a red rash with swelling of the face, lips, or lymph nodes in your neck or under your arms. You may get drowsy or dizzy. Do not drive, use machinery, or do anything that needs mental alertness until you know how this drug affects you. Do not stand up or sit up quickly, especially if you are an older patient. This reduces the risk of dizzy or fainting spells. Alcohol may interfere with the effect of this drug. Avoid alcoholic drinks. This drug will cause  constipation. If you do not have a bowel movement for 3 days, call your health care provider. Your mouth may get dry. Chewing sugarless gum or sucking hard candy and drinking plenty of water may help. Contact your health care provider if the problem does not go away or is severe. What side effects may I notice from receiving this medicine? Side effects that you should report to your doctor or health care professional as soon as possible:  allergic reactions like skin rash, itching or hives, swelling of the face, lips, or tongue  breathing problems  confusion  redness, blistering, peeling or loosening of the skin, including inside the mouth  seizures  signs and symptoms of low blood pressure like dizziness; feeling faint or lightheaded, falls; unusually weak or tired  trouble passing urine or change in the amount of urine Side effects that usually do not require medical attention (report to your doctor or health care professional if they continue or are bothersome):  constipation  dry mouth  nausea, vomiting  tiredness This list may not describe all possible side effects. Call your doctor for medical advice about side effects. You may report side effects to   FDA at 1-800-FDA-1088. Where should I keep my medicine? Keep out of the reach of children. This medicine may cause accidental overdose and death if it taken by other adults, children, or pets. Mix any unused medicine with a substance like cat litter or coffee grounds. Then throw the medicine away in a sealed container like a sealed bag or a coffee can with a lid. Do not use the medicine after the expiration date. Store at room temperature between 15 and 30 degrees C (59 and 86 degrees F). NOTE: This sheet is a summary. It may not cover all possible information. If you have questions about this medicine, talk to your doctor, pharmacist, or health care provider.  2020 Elsevier/Gold Standard (2018-10-02 13:08:25)  

## 2020-03-17 ENCOUNTER — Telehealth: Payer: Self-pay | Admitting: Urology

## 2020-03-17 NOTE — Telephone Encounter (Signed)
Would you call Brent Glass and have him schedule a virtual visit so we discuss and get him scheduled for a prostate biopsy?  We need to do this pretty quickly, but he is on ASA according to his chart.  So, we need to get him cleared to come off that medication.  

## 2020-03-17 NOTE — Telephone Encounter (Signed)
Unable to leave message, Voice mail not set up 

## 2020-03-19 ENCOUNTER — Inpatient Hospital Stay: Payer: Medicare Other | Admitting: Hospice and Palliative Medicine

## 2020-03-19 ENCOUNTER — Inpatient Hospital Stay: Payer: Medicare Other

## 2020-03-19 ENCOUNTER — Inpatient Hospital Stay (HOSPITAL_BASED_OUTPATIENT_CLINIC_OR_DEPARTMENT_OTHER): Payer: Medicare Other | Admitting: Internal Medicine

## 2020-03-19 VITALS — BP 112/63 | HR 67 | Temp 98.3°F | Resp 18 | Wt 214.0 lb

## 2020-03-19 DIAGNOSIS — C61 Malignant neoplasm of prostate: Secondary | ICD-10-CM

## 2020-03-19 DIAGNOSIS — Z5111 Encounter for antineoplastic chemotherapy: Secondary | ICD-10-CM | POA: Diagnosis not present

## 2020-03-19 LAB — CBC WITH DIFFERENTIAL/PLATELET
Abs Immature Granulocytes: 0.05 10*3/uL (ref 0.00–0.07)
Basophils Absolute: 0.1 10*3/uL (ref 0.0–0.1)
Basophils Relative: 1 %
Eosinophils Absolute: 0.3 10*3/uL (ref 0.0–0.5)
Eosinophils Relative: 3 %
HCT: 37.9 % — ABNORMAL LOW (ref 39.0–52.0)
Hemoglobin: 12.4 g/dL — ABNORMAL LOW (ref 13.0–17.0)
Immature Granulocytes: 1 %
Lymphocytes Relative: 32 %
Lymphs Abs: 3.6 10*3/uL (ref 0.7–4.0)
MCH: 28.6 pg (ref 26.0–34.0)
MCHC: 32.7 g/dL (ref 30.0–36.0)
MCV: 87.3 fL (ref 80.0–100.0)
Monocytes Absolute: 0.9 10*3/uL (ref 0.1–1.0)
Monocytes Relative: 8 %
Neutro Abs: 6.1 10*3/uL (ref 1.7–7.7)
Neutrophils Relative %: 55 %
Platelets: 330 10*3/uL (ref 150–400)
RBC: 4.34 MIL/uL (ref 4.22–5.81)
RDW: 16 % — ABNORMAL HIGH (ref 11.5–15.5)
WBC: 11 10*3/uL — ABNORMAL HIGH (ref 4.0–10.5)
nRBC: 0 % (ref 0.0–0.2)

## 2020-03-19 LAB — COMPREHENSIVE METABOLIC PANEL
ALT: 12 U/L (ref 0–44)
AST: 13 U/L — ABNORMAL LOW (ref 15–41)
Albumin: 3.8 g/dL (ref 3.5–5.0)
Alkaline Phosphatase: 273 U/L — ABNORMAL HIGH (ref 38–126)
Anion gap: 9 (ref 5–15)
BUN: 14 mg/dL (ref 8–23)
CO2: 27 mmol/L (ref 22–32)
Calcium: 9.5 mg/dL (ref 8.9–10.3)
Chloride: 101 mmol/L (ref 98–111)
Creatinine, Ser: 1.07 mg/dL (ref 0.61–1.24)
GFR, Estimated: >60 mL/min (ref >60)
Glucose, Bld: 82 mg/dL (ref 70–99)
Potassium: 4.1 mmol/L (ref 3.5–5.1)
Sodium: 137 mmol/L (ref 135–145)
Total Bilirubin: 0.5 mg/dL (ref 0.3–1.2)
Total Protein: 6.9 g/dL (ref 6.5–8.1)

## 2020-03-19 LAB — PSA: Prostatic Specific Antigen: 21.59 ng/mL — ABNORMAL HIGH (ref 0.00–4.00)

## 2020-03-19 MED ORDER — DEGARELIX ACETATE 80 MG ~~LOC~~ SOLR
80.0000 mg | Freq: Once | SUBCUTANEOUS | Status: AC
  Administered 2020-03-19: 80 mg via SUBCUTANEOUS
  Filled 2020-03-19: qty 4

## 2020-03-19 MED ORDER — TRAMADOL HCL 50 MG PO TABS: 50.0000 mg | ORAL_TABLET | Freq: Four times a day (QID) | ORAL | 0 refills | Status: DC | PRN

## 2020-03-19 NOTE — Assessment & Plan Note (Addendum)
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  November 2021 PET scan-multiple lung lesions multiple bone lesions and prostate uptake; discussed at tumor conference; recommend prostate biopsy. Recommend reaching out to urology.  Stable.  PSA improving.  # Currently on ADT, Firmagon- start 1/02-2021 Zytiga 4 pills + predisnone [on empty stomach].  I again reviewed the potential side effects.;  Discussed with pharmacy.Will switch to Eligard at next visit.  Discussed with patient.  # Breast tenderness-grade 1-2.  Stable sec to ADT- continue tramadol prn for now.   # DISPOSITION: # Firmagon today # Follow up in 4 weeks MD; cbc/cmp/PSA; Eligard- Dr.B

## 2020-03-19 NOTE — Progress Notes (Unsigned)
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   # NOV 2021- CTA [ER] Diffuse bilateral pulmonary nodules including a cavitary lesion in the right upper lobe measuring approximately 2 cm. There is extensive nodular pleural thickening bilaterally, greatest in the left lower lobe.  November 2021 PET scan-multiple lung lesions multiple bone lesions and prostate uptake.    # DM-2; NO COPD [?]; ACTIVE SMOKER 65ppd.  # DEC 2nd week- FIRMAGON; # JAN 12TH, 2022-ZYTIGA  1000 MG+ PRED 5MG ; FEB MID 2022- ELIGARD q6M  # NGS/MOLECULAR TESTS:    # PALLIATIVE CARE EVALUATION:  # PAIN MANAGEMENT:    DIAGNOSIS: Prostate cancer  STAGE:   IV      ;  GOALS: Palliative  CURRENT/MOST RECENT THERAPY : ADT+ ZYTIGA    Prostate cancer metastatic to multiple sites [facility])  02/04/2020 Initial Diagnosis   Prostate cancer metastatic to multiple sites [facility])   03/19/2020 Cancer Staging   Staging form: Prostate, AJCC 8th Edition - Clinical: Stage IVB (pM1b) - Signed by Cammie Sickle, MD on 03/19/2020      HISTORY OF PRESENTING ILLNESS:  Brent Glass 80 y.o.  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  Patient is on Firmagon.  Apparently patient has missed a phone call from urology with regards to appointment for prostate biopsy.  Complains of mild breast tenderness.  On the right side.  Otherwise no hot flashes.  No nausea vomiting.  Appetite is good.  Gaining weight.  No swelling legs.   Review of Systems  Constitutional: Positive for weight loss. Negative for chills, diaphoresis, fever and malaise/fatigue.  HENT: Negative for  nosebleeds and sore throat.   Eyes: Negative for double vision.  Respiratory: Positive for cough. Negative for hemoptysis, sputum production, shortness of breath and wheezing.   Cardiovascular: Negative for chest pain, palpitations, orthopnea and leg swelling.  Gastrointestinal: Negative for abdominal pain, blood in stool, constipation, diarrhea, heartburn, melena, nausea and vomiting.  Genitourinary: Negative for dysuria, frequency and urgency.  Musculoskeletal: Positive for back pain. Negative for joint pain.  Skin: Negative.  Negative for itching and rash.  Neurological: Negative for dizziness, tingling, focal weakness, weakness and headaches.  Endo/Heme/Allergies: Does not bruise/bleed easily.  Psychiatric/Behavioral: Negative for depression. The patient is not nervous/anxious and does not have insomnia.      MEDICAL HISTORY:  Past Medical History:  Diagnosis Date  . Arthritis   . BPH (benign prostatic hyperplasia)   . BPH with obstruction/lower urinary tract symptoms   . Diabetes ([place])   . Disorder resulting from impaired renal function   . Elevated PSA   . Family history of lung cancer   . Family history of lymphoma   . Heart murmur   . Hypertension   . Knee pain   . Lesion of lung   . Low HDL (under 40)   . Obesity   . Urinary hesitancy     SURGICAL HISTORY: Past Surgical History:  Procedure Laterality Date  . HERNIA REPAIR  6063   Umbilical Hernia Repair  . KNEE ARTHROSCOPY Left 04/10/2015   Procedure: ARTHROSCOPY KNEE, partial medial meniscectomy, partial synovectomy;  Surgeon: Hessie Knows, MD;  Location: [facility] ORS;  Service: Orthopedics;  Laterality: Left;    SOCIAL HISTORY: Social History   Socioeconomic History  . Marital status: Widowed    Spouse name: Not on file  . Number of children: Not on file  . Years of education: Not on file  . Highest education level: Not on file  Occupational History  . Not on file  Tobacco Use  . Smoking status: Current  Every Day Smoker    Packs/day: 1.00    Types: Cigarettes  . Smokeless tobacco: Never Used  Vaping Use  . Vaping Use: Never used  Substance and Sexual Activity  . Alcohol use: No    Alcohol/week: 0.0 standard drinks  . Drug use: No  . Sexual activity: Not Currently  Other Topics Concern  . Not on file  Social History Narrative   Lives in [place]; self; daughter lives 1 mile. Smoke 1ppd > 65 years; stopped 25 years ago.  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    Social Determinants of Health   Financial Resource Strain: Not on file  Food Insecurity: Not on file  Transportation Needs: Not on file  Physical Activity: Not on file  Stress: Not on file  Social Connections: Not on file  Intimate Partner Violence: Not on file    FAMILY HISTORY: Family History  Problem Relation Age of Onset  . Diabetes Mother   . Lung cancer Mother   . Lymphoma Son 28       died in 18.   . Kidney disease Neg Hx   . Prostate cancer Neg Hx     ALLERGIES:  has No Known Allergies.  MEDICATIONS:  Current Outpatient Medications  Medication Sig Dispense Refill  . abiraterone acetate (ZYTIGA) 250 MG tablet Take 4 tablets (1,000 mg total) by mouth daily. Take on an empty stomach 1 hour before or 2 hours after a meal 120 tablet 0  . acetaminophen (TYLENOL) 650 MG CR tablet Take 650 mg by mouth every 8 (eight) hours as needed for pain.    Marland Kitchen albuterol (PROVENTIL HFA;VENTOLIN HFA) 108 (90 BASE) MCG/ACT inhaler Inhale 2 puffs into the lungs every 6 (six) hours as needed for wheezing or shortness of breath.    Marland Kitchen aspirin 81 MG tablet Take 81 mg by mouth daily.    . finasteride (PROSCAR) 5 MG tablet TAKE 1 TABLET BY MOUTH EVERY DAY 90 tablet 3  . glipiZIDE (GLUCOTROL XL) 10 MG 24 hr tablet 1 BY MOUTH DAILY FOR DIABETES  2  . lisinopril-hydrochlorothiazide (PRINZIDE,ZESTORETIC) 20-25 MG per tablet Take 1 tablet by mouth daily.    . meloxicam (MOBIC) 15 MG tablet Take 1 tablet (15 mg  total) by mouth daily. 14 tablet 0  . metFORMIN (GLUCOPHAGE) 1000 MG tablet Take 1,000 mg by mouth 2 (two) times daily with a meal.    . predniSONE (DELTASONE) 5 MG tablet Take 1 tablet (5 mg total) by mouth daily with breakfast. 30 tablet 3  . simvastatin (ZOCOR) 40 MG tablet Take 40 mg by mouth daily.    . traMADol (ULTRAM) 50 MG tablet Take 1 tablet (50 mg total) by mouth every 6 (six) hours as needed for moderate pain or severe pain. 30 tablet 0   No current facility-administered medications for this visit.      Marland Kitchen  PHYSICAL EXAMINATION: ECOG PERFORMANCE STATUS: 0 - Asymptomatic  Vitals:   03/19/20 0928  BP: 112/63  Pulse: 67  Resp: 18  Temp: 98.3 F (36.8 C)  SpO2: 100%   Filed Weights   03/19/20 0928  Weight: 214 lb (97.1 kg)    Physical Exam Constitutional:      Comments: Ambulating independently.  Accompanied by his daughter.  HENT:     Head: Normocephalic and atraumatic.     Mouth/Throat:     Pharynx: No oropharyngeal exudate.  Eyes:     Pupils: Pupils are equal, round, and reactive to light.  Cardiovascular:     Rate and Rhythm: Normal rate and regular rhythm.  Pulmonary:     Effort: No respiratory distress.     Breath sounds: No wheezing.     Comments: Decreased breath sounds bilaterally. Abdominal:     General: Bowel sounds are normal. There is no distension.     Palpations: Abdomen is soft. There is no mass.     Tenderness: There is no abdominal tenderness. There is no guarding or rebound.  Musculoskeletal:        General: No tenderness. Normal range of motion.     Cervical back: Normal range of motion and neck supple.  Skin:    General: Skin is warm.  Neurological:     Mental Status: He is alert and oriented to person, place, and time.  Psychiatric:        Mood and Affect: Affect normal.      LABORATORY DATA:  I have reviewed the data as listed Lab Results  Component Value Date   WBC 11.0 (H) 03/19/2020   HGB 12.4 (L) 03/19/2020   HCT  37.9 (L) 03/19/2020   MCV 87.3 03/19/2020   PLT 330 03/19/2020   Recent Labs    01/16/20 2036 03/10/20 1411 03/19/20 0919  NA 138 138 137  K 3.9 4.2 4.1  CL 103 102 101  CO2 22 24 27   GLUCOSE 97 100* 82  BUN 16 18 14   CREATININE 1.23 1.10 1.07  CALCIUM 9.4 9.3 9.5  GFRNONAA 59* >60 >60  PROT 7.6 6.9 6.9  ALBUMIN 3.8 4.0 3.8  AST 13* 15 13*  ALT 12 16 12   ALKPHOS 120 317* 273*  BILITOT 0.7 0.5 0.5    RADIOGRAPHIC STUDIES: I have personally reviewed the radiological images as listed and agreed with the findings in the report. No results found.  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  November 2021 PET scan-multiple lung lesions multiple bone lesions and prostate uptake; discussed at tumor conference; recommend prostate biopsy. Recommend reaching out to urology.  # Currently on ADT, Firmagon- start 1/02-2021 Zytiga 4 pills + predisnone [on empty stomach].  I again reviewed the potential side effects.;  Discussed with pharmacy.Will switch to Eligard at next visit.  Discussed with patient.  # Breast tenderness- sec to ADT- continue tramadol prn for now.   # DISPOSITION: # Firmagon today # Follow up in 4 weeks MD; cbc/cmp/PSA; Eligard- Dr.B    All questions were answered. The patient knows to call the clinic with any problems, questions or concerns.    Cammie Sickle, MD 03/20/2020 8:00 AM

## 2020-03-20 NOTE — Telephone Encounter (Signed)
Patient's daughter notified and voiced understanding. Cardiac clearance was fax to Dr. Clide Deutscher to stop ASA. Virtual appointment has been scheduled.

## 2020-03-26 ENCOUNTER — Telehealth: Payer: Self-pay | Admitting: Licensed Clinical Social Worker

## 2020-03-26 ENCOUNTER — Ambulatory Visit: Payer: Self-pay | Admitting: Licensed Clinical Social Worker

## 2020-03-26 ENCOUNTER — Encounter: Payer: Self-pay | Admitting: Licensed Clinical Social Worker

## 2020-03-26 DIAGNOSIS — Z1379 Encounter for other screening for genetic and chromosomal anomalies: Secondary | ICD-10-CM | POA: Insufficient documentation

## 2020-03-26 DIAGNOSIS — Z801 Family history of malignant neoplasm of trachea, bronchus and lung: Secondary | ICD-10-CM

## 2020-03-26 DIAGNOSIS — Z807 Family history of other malignant neoplasms of lymphoid, hematopoietic and related tissues: Secondary | ICD-10-CM

## 2020-03-26 DIAGNOSIS — C61 Malignant neoplasm of prostate: Secondary | ICD-10-CM

## 2020-03-26 NOTE — Progress Notes (Signed)
03/27/2020 3:37 PM   Brent Glass 1940-08-10 [id number]  Referring provider: Donnie Coffin, MD [place],  [place] [address]  No chief complaint on file.  Urological history 1. 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   # NOV 2021- CTA [ER] Diffuse bilateral pulmonary nodules including a cavitary lesion in the right upper lobe measuring approximately 2 cm. There is extensive nodular pleural thickening bilaterally, greatest in the left lower lobe.  November 2021 PET scan-multiple lung lesions multiple bone lesions and prostate uptake - on ADT and Zytiga - in need of a biopsy for tissue to determine grade of tumor  HPI: Brent Glass is a 80 y.o. male who is contacted by telephone to discuss prostate biopsy.    Virtual Visit via Telephone Note  I connected with Brent Glass and his daughter, Brent Glass, on 03/27/20 at  3:00 PM EST by telephone and verified that I am speaking with the correct person using two identifiers.  Location: Patient: Home Provider: Office   I discussed the limitations, risks, security and privacy concerns of performing an evaluation and management service by telephone and the availability of in person appointments. I also discussed with the patient that there may be a patient responsible charge related to this service. The patient expressed understanding and agreed to proceed.  Patient does not sound distressed and is answering questions appropriately.    PMH: Past Medical History:  Diagnosis Date  . Arthritis   . BPH (benign prostatic hyperplasia)   . BPH with obstruction/lower urinary tract symptoms   . Diabetes ([place])   . Disorder resulting from impaired renal function   . Elevated PSA   . Family history of lung cancer   . Family history of lymphoma   . Heart murmur   . Hypertension   . Knee pain   . Lesion of lung   . Low HDL (under 40)   . Obesity   . Urinary  hesitancy     Surgical History: Past Surgical History:  Procedure Laterality Date  . HERNIA REPAIR  7628   Umbilical Hernia Repair  . KNEE ARTHROSCOPY Left 04/10/2015   Procedure: ARTHROSCOPY KNEE, partial medial meniscectomy, partial synovectomy;  Surgeon: Hessie Knows, MD;  Location: [facility] ORS;  Service: Orthopedics;  Laterality: Left;    Home Medications:  Allergies as of 03/27/2020   No Known Allergies     Medication List       Accurate as of March 27, 2020  3:37 PM. If you have any questions, ask your nurse or doctor.        STOP taking these medications   meloxicam 15 MG tablet Commonly known as: MOBIC Stopped by: Brent Council, PA-C     TAKE these medications   abiraterone acetate 250 MG tablet Commonly known as: ZYTIGA Take 4 tablets (1,000 mg total) by mouth daily. Take on an empty stomach 1 hour before or 2 hours after a meal   acetaminophen 650 MG CR tablet Commonly known as: TYLENOL Take 650 mg by mouth every 8 (eight) hours as needed for pain.   albuterol 108 (90 Base) MCG/ACT inhaler Commonly known as: VENTOLIN HFA Inhale 2 puffs into the lungs every 6 (six) hours as needed for wheezing or shortness of breath.   aspirin 81 MG tablet Take 81 mg by mouth daily.   finasteride 5 MG tablet Commonly known as: PROSCAR TAKE 1 TABLET  BY MOUTH EVERY DAY   glipiZIDE 10 MG 24 hr tablet Commonly known as: GLUCOTROL XL 1 BY MOUTH DAILY FOR DIABETES   lisinopril-hydrochlorothiazide 20-25 MG tablet Commonly known as: ZESTORETIC Take 1 tablet by mouth daily.   metFORMIN 1000 MG tablet Commonly known as: GLUCOPHAGE Take 1,000 mg by mouth 2 (two) times daily with a meal.   predniSONE 5 MG tablet Commonly known as: DELTASONE Take 1 tablet (5 mg total) by mouth daily with breakfast.   simvastatin 40 MG tablet Commonly known as: ZOCOR Take 40 mg by mouth daily.   Tradjenta 5 MG Tabs tablet Generic drug: linagliptin Take 5 mg by mouth daily.   traMADol  50 MG tablet Commonly known as: ULTRAM Take 1 tablet (50 mg total) by mouth every 6 (six) hours as needed for moderate pain or severe pain.       Allergies: No Known Allergies  Family History: Family History  Problem Relation Age of Onset  . Diabetes Mother   . Lung cancer Mother   . Lymphoma Son 28       died in 33.   . Kidney disease Neg Hx   . Prostate cancer Neg Hx     Social History:  reports that he has been smoking cigarettes. He has been smoking about 1.00 pack per day. He has never used smokeless tobacco. He reports that he does not drink alcohol and does not use drugs.  ROS: Pertinent ROS in HPI  Laboratory Data: Lab Results  Component Value Date   WBC 11.0 (H) 03/19/2020   HGB 12.4 (L) 03/19/2020   HCT 37.9 (L) 03/19/2020   MCV 87.3 03/19/2020   PLT 330 03/19/2020    Lab Results  Component Value Date   CREATININE 1.07 03/19/2020    Lab Results  Component Value Date   AST 13 (L) 03/19/2020   Lab Results  Component Value Date   ALT 12 03/19/2020   Component     Latest Ref Rng & Units 01/23/2020 03/10/2020 03/19/2020  Prostatic Specific Antigen     0.00 - 4.00 ng/mL 986.00 (H) 34.69 (H) 21.59 (H)   I have reviewed the labs.   Assessment & Plan:    1. Prostate cancer  - Patient will be schedule for a TRUSPBx of prostate.  The procedure is explained and the risks involved, such as blood in urine, blood in stool, blood in semen, infection, urinary retention, and on rare occasions sepsis and death.  Patient understands the risks as explained to him and he wishes to proceed.  Patient is on ASA and is advised to discontinue the medication ten days prior to the biopsy after we achieve clearance from Dr. Clide Deutscher.   Return for prostate biopsy .  These notes generated with voice recognition software. I apologize for typographical errors.  Brent Council, PA-C  [facility] [address]  [place], [place]  [address] [phone]  I provided 30 minutes of nonface-to-face time during this encounter.

## 2020-03-26 NOTE — Progress Notes (Signed)
HPI:  Mr. Opdahl was previously seen in the [place] clinic due to a personal  history of metastatic prostate cancer and concerns regarding a hereditary predisposition to cancer. Please refer to our prior cancer genetics clinic note for more information regarding our discussion, assessment and recommendations, at the time. Mr. Reggio recent genetic test results were disclosed to him, as were recommendations warranted by these results. These results and recommendations are discussed in more detail below.  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   # NOV 2021- CTA [ER] Diffuse bilateral pulmonary nodules including a cavitary lesion in the right upper lobe measuring approximately 2 cm. There is extensive nodular pleural thickening bilaterally, greatest in the left lower lobe.  November 2021 PET scan-multiple lung lesions multiple bone lesions and prostate uptake.    # DM-2; NO COPD [?]; ACTIVE SMOKER 65ppd.  # DEC 2nd week- FIRMAGON; # JAN 12TH, 2022-ZYTIGA  1000 MG+ PRED 5MG; FEB MID 2022- ELIGARD q6M  # NGS/MOLECULAR TESTS:    # PALLIATIVE CARE EVALUATION:  # PAIN MANAGEMENT:    DIAGNOSIS: Prostate cancer  STAGE:   IV      ;  GOALS: Palliative  CURRENT/MOST RECENT THERAPY : ADT+ ZYTIGA    Prostate cancer metastatic to multiple sites [facility])  02/04/2020 Initial Diagnosis   Prostate cancer metastatic to multiple sites [facility])   03/19/2020 Cancer Staging   Staging form: Prostate, AJCC 8th Edition - Clinical: Stage IVB (pM1b) - Signed by Cammie Sickle, MD on 03/19/2020    Genetic Testing   Negative genetic testing. No pathogenic variants identified on the [facility] Multi-Cancer Panel. VUS in AXIN2 called c.1235A>C identified. The report date is 03/23/2020.   The Multi-Cancer Panel offered by [facility] includes sequencing and/or deletion duplication testing of the following 84  genes: AIP, ALK, APC, ATM, AXIN2,BAP1,  BARD1, BLM, BMPR1A, BRCA1, BRCA2, BRIP1, CASR, CDC73, CDH1, CDK4, CDKN1B, CDKN1C, CDKN2A (p14ARF), CDKN2A (p16INK4a), CEBPA, CHEK2, CTNNA1, DICER1, DIS3L2, EGFR (c.2369C>T, p.Thr790Met variant only), EPCAM (Deletion/duplication testing only), FH, FLCN, GATA2, GPC3, GREM1 (Promoter region deletion/duplication testing only), HOXB13 (c.251G>A, p.Gly84Glu), HRAS, KIT, MAX, MEN1, MET, MITF (c.952G>A, p.Glu318Lys variant only), MLH1, MSH2, MSH3, MSH6, MUTYH, NBN, NF1, NF2, NTHL1, PALB2, PDGFRA, PHOX2B, PMS2, POLD1, POLE, POT1, PRKAR1A, PTCH1, PTEN, RAD50, RAD51C, RAD51D, RB1, RECQL4, RET, RUNX1, SDHAF2, SDHA (sequence changes only), SDHB, SDHC, SDHD, SMAD4, SMARCA4, SMARCB1, SMARCE1, STK11, SUFU, TERC, TERT, TMEM127, TP53, TSC1, TSC2, VHL, WRN and WT1.      FAMILY HISTORY:  We obtained a detailed, 4-generation family history.  Significant diagnoses are listed below: Family History  Problem Relation Age of Onset  . Diabetes Mother   . Lung cancer Mother   . Lymphoma Son 28       died in 52.   . Kidney disease Neg Hx   . Prostate cancer Neg Hx     Mr. Dutton had 3 sons and 1 daughter. One of his sons had lymphoma and died at 41. He has 1 maternal half brother and 2 maternal half sisters, no history of cancer.  Mr. Ivery mother died of lung cancer at 68, she did have history of smoking. No other known cancers on this side of the family.   Mr. Blazejewski does not have information about his paternal side of the family, but is unaware of any cancers.   Mr. Gaudin is unaware of previous family history of genetic testing for  hereditary cancer risks. Patient's maternal ancestors are of unknown descent, and paternal ancestors are of unknown descent. There is no reported Ashkenazi Jewish ancestry. There is no known consanguinity.     GENETIC TEST RESULTS: Genetic testing reported out on 03/23/2020 through the [facility] Multi- cancer panel found no pathogenic  mutations.   The Multi-Cancer Panel offered by [facility] includes sequencing and/or deletion duplication testing of the following 84 genes: AIP, ALK, APC, ATM, AXIN2,BAP1,  BARD1, BLM, BMPR1A, BRCA1, BRCA2, BRIP1, CASR, CDC73, CDH1, CDK4, CDKN1B, CDKN1C, CDKN2A (p14ARF), CDKN2A (p16INK4a), CEBPA, CHEK2, CTNNA1, DICER1, DIS3L2, EGFR (c.2369C>T, p.Thr790Met variant only), EPCAM (Deletion/duplication testing only), FH, FLCN, GATA2, GPC3, GREM1 (Promoter region deletion/duplication testing only), HOXB13 (c.251G>A, p.Gly84Glu), HRAS, KIT, MAX, MEN1, MET, MITF (c.952G>A, p.Glu318Lys variant only), MLH1, MSH2, MSH3, MSH6, MUTYH, NBN, NF1, NF2, NTHL1, PALB2, PDGFRA, PHOX2B, PMS2, POLD1, POLE, POT1, PRKAR1A, PTCH1, PTEN, RAD50, RAD51C, RAD51D, RB1, RECQL4, RET, RUNX1, SDHAF2, SDHA (sequence changes only), SDHB, SDHC, SDHD, SMAD4, SMARCA4, SMARCB1, SMARCE1, STK11, SUFU, TERC, TERT, TMEM127, TP53, TSC1, TSC2, VHL, WRN and WT1.   The test report has been scanned into EPIC and is located under the Molecular Pathology section of the Results Review tab.  A portion of the result report is included below for reference.     We discussed with Mr. Deshmukh that because current genetic testing is not perfect, it is possible there may be a gene mutation in one of these genes that current testing cannot detect, but that chance is small.  We also discussed, that there could be another gene that has not yet been discovered, or that we have not yet tested, that is responsible for the cancer diagnoses in the family. It is also possible there is a hereditary cause for the cancer in the family that Mr. Mcfadyen did not inherit and therefore was not identified in his testing.  Therefore, it is important to remain in touch with cancer genetics in the future so that we can continue to offer Mr. Pletz the most up to date genetic testing.   Genetic testing did identify a variant of uncertain significance (VUS) in the AXIN2 gene  At this time,  it is unknown if this variant is associated with increased cancer risk or if this is a normal finding, but most variants such as this get reclassified to being inconsequential. It should not be used to make medical management decisions. With time, we suspect the lab will determine the significance of this variant, if any. If we do learn more about it, we will try to contact Mr. Justen to discuss it further. However, it is important to stay in touch with [place] periodically and keep the address and phone number up to date.  ADDITIONAL GENETIC TESTING: We discussed with Mr. Ravi that his genetic testing was fairly extensive.  If there are genes identified to increase cancer risk that can be analyzed in the future, we would be happy to discuss and coordinate this testing at that time.    CANCER SCREENING RECOMMENDATIONS: Mr. Holloran test result is considered negative (normal).  This means that we have not identified a hereditary cause for his  personal and family history of cancer at this time. Most cancers happen by chance and this negative test suggests that his cancer may fall into this category.    While reassuring, this does not definitively rule out a hereditary predisposition to cancer. It is still possible that there could be genetic mutations that are undetectable by current technology. There could  be genetic mutations in genes that have not been tested or identified to increase cancer risk.  Therefore, it is recommended he continue to follow the cancer management and screening guidelines provided by his oncology and primary healthcare provider.   An individual's cancer risk and medical management are not determined by genetic test results alone. Overall cancer risk assessment incorporates additional factors, including personal medical history, family history, and any available genetic information that may result in a personalized plan for cancer prevention and surveillance.  RECOMMENDATIONS FOR  FAMILY MEMBERS:  Relatives in this family might be at some increased risk of developing cancer, over the general population risk, simply due to the family history of cancer.  We recommended male relatives in this family have a yearly mammogram beginning at age 71, or 79 years younger than the earliest onset of cancer, an annual clinical breast exam, and perform monthly breast self-exams. Male relatives in this family should also have a gynecological exam as recommended by their primary provider.  All family members should be referred for colonoscopy starting at age 44.   FOLLOW-UP: Lastly, we discussed with Mr. Serano that cancer genetics is a rapidly advancing field and it is possible that new genetic tests will be appropriate for him and/or his family members in the future. We encouraged him to remain in contact with cancer genetics on an annual basis so we can update his personal and family histories and let him know of advances in cancer genetics that may benefit this family.   Our contact number was provided. Mr. Datta questions were answered to his satisfaction, and he knows he is welcome to call us at anytime with additional questions or concerns.   Faith Rogue, MS, [facility] Genetic Counselor [email or web address] Phone: [phone]

## 2020-03-26 NOTE — Telephone Encounter (Signed)
Revealed negative genetic testing.  Revealed that a VUS in AXIN2 was identified. This normal result is reassuring and indicates that it is unlikely Brent Glass's cancer is due to a hereditary cause.  It is unlikely that there is an increased risk of another cancer due to a mutation in one of these genes.  However, genetic testing is not perfect, and cannot definitively rule out a hereditary cause.  It will be important for him to keep in contact with genetics to learn if any additional testing may be needed in the future.

## 2020-03-27 ENCOUNTER — Telehealth: Payer: Self-pay | Admitting: Urology

## 2020-03-27 ENCOUNTER — Other Ambulatory Visit: Payer: Self-pay

## 2020-03-27 ENCOUNTER — Telehealth (INDEPENDENT_AMBULATORY_CARE_PROVIDER_SITE_OTHER): Payer: Medicare Other | Admitting: Urology

## 2020-03-27 DIAGNOSIS — C61 Malignant neoplasm of prostate: Secondary | ICD-10-CM

## 2020-03-27 NOTE — Telephone Encounter (Signed)
I have spoken with Mr. Brent Glass and his daughter, Brent Glass, regarding how the biopsy is performed and the risks involved.  Mr. Brent Glass does take a aspirin 81 mg daily prescribed by Dr. Clide Deutscher so we will need to get clearance for him to stop that prior to the biopsy.  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  We need to get this biopsy scheduled ASAP.

## 2020-03-27 NOTE — Progress Notes (Signed)
This service is provided via telemedicine   No vital signs collected/recorded due to the encounter was a telemedicine visit.     Patient consents to a telephone visit:  yes    Names of all persons participating in the telemedicine service and their role in the encounter:  Emon Miggins O`Sullivan,RMA   

## 2020-03-28 NOTE — Telephone Encounter (Signed)
The Cardiac clearance form has been faxed to Dr. Clide Deutscher. Waiting on response.

## 2020-03-28 NOTE — Telephone Encounter (Signed)
Patient's daughter notified that patient is to stop Aspirin 7 days prior to the Biopsy and I also reminded her that Mr. Chopin is to do the Fleets enema 2 hours before the procedure. Biopsy has been scheduled.

## 2020-04-07 ENCOUNTER — Inpatient Hospital Stay: Payer: Medicare Other

## 2020-04-07 NOTE — Progress Notes (Signed)
Nutrition Follow-up:   Patient with new diagnosis of metastatic prostate cancer (bone and lung).    Spoke with patient's daughter by phone for nutrition follow-up.  Daughter reports that appetite is good.  Patient eating while on the phone.  Patient has been trying to eat breakfast more consistently as would skip breakfast before.  Daughter denies that he is having any nutrition impact symptoms at this time.      Medications: reviewed  Labs: reviewed  Anthropometrics:   Weight 214 lb on 1/12 increased from 213 lb on 1/3.     NUTRITION DIAGNOSIS: Food and nutrition related knowledge deficit improved.      INTERVENTION:  Encouraged well-balanced diet including good sources of protein to help maintain weight. Daughter to contact RD if changes in appetite occur or weight starts to decline.  Daughter has contact information    MONITORING, EVALUATION, GOAL: weight trends, intake   NEXT VISIT: no follow-up RD available as needed  Brent Glass B. Zenia Resides, Brent Glass, Brent Glass Registered Dietitian [phone] (mobile)

## 2020-04-08 ENCOUNTER — Encounter: Payer: Self-pay | Admitting: Urology

## 2020-04-08 ENCOUNTER — Ambulatory Visit (INDEPENDENT_AMBULATORY_CARE_PROVIDER_SITE_OTHER): Payer: Medicare Other | Admitting: Urology

## 2020-04-08 ENCOUNTER — Other Ambulatory Visit: Payer: Self-pay

## 2020-04-08 VITALS — BP 124/68 | HR 94 | Ht 72.0 in | Wt 208.0 lb

## 2020-04-08 DIAGNOSIS — C61 Malignant neoplasm of prostate: Secondary | ICD-10-CM

## 2020-04-08 MED ORDER — GENTAMICIN SULFATE 40 MG/ML IJ SOLN
80.0000 mg | Freq: Once | INTRAMUSCULAR | Status: AC
  Administered 2020-04-08: 80 mg via INTRAMUSCULAR

## 2020-04-08 MED ORDER — LEVOFLOXACIN 500 MG PO TABS
500.0000 mg | ORAL_TABLET | Freq: Once | ORAL | Status: AC
  Administered 2020-04-08: 500 mg via ORAL

## 2020-04-08 NOTE — Progress Notes (Signed)
   04/08/20  CC:  Chief Complaint  Patient presents with  . Prostate Biopsy    HPI: 80 year old male with clinical diagnosis of prostate cancer, PSA greater than 800 with metastatic disease who presents today for prostate biopsy as requested by Dr. Rogue Bussing.  Blood pressure 124/68, pulse 94, height 6' (1.829 m), weight 208 lb (94.3 kg). NED. A&Ox3.   No respiratory distress   Abd soft, NT, ND Normal sphincter tone  Prostate Biopsy Procedure   Informed consent was obtained after discussing risks/benefits of the procedure.  A time out was performed to ensure correct patient identity.  Pre-Procedure: - Gentamicin given prophylactically - Levaquin 500 mg administered PO -Transrectal Ultrasound performed revealing a 65.99 gm prostate -No significant hypoechoic or median lobe noted  Procedure: - Prostate block performed using 10 cc 1% lidocaine and biopsies taken from sextant areas, a total of 12 under ultrasound guidance.  Post-Procedure: - Patient tolerated the procedure well - He was counseled to seek immediate medical attention if experiences any severe pain, significant bleeding, or fevers -His prostate cancer is being managed by Dr. Rogue Bussing, will have him follow-up with him in 1 week at which time we should have the pathology results  Hollice Espy, MD

## 2020-04-08 NOTE — Patient Instructions (Signed)

## 2020-04-10 LAB — SURGICAL PATHOLOGY

## 2020-04-16 ENCOUNTER — Inpatient Hospital Stay (HOSPITAL_BASED_OUTPATIENT_CLINIC_OR_DEPARTMENT_OTHER): Payer: Medicare Other | Admitting: Internal Medicine

## 2020-04-16 ENCOUNTER — Inpatient Hospital Stay: Payer: Medicare Other | Attending: Internal Medicine

## 2020-04-16 ENCOUNTER — Inpatient Hospital Stay: Payer: Medicare Other

## 2020-04-16 DIAGNOSIS — C61 Malignant neoplasm of prostate: Secondary | ICD-10-CM

## 2020-04-16 DIAGNOSIS — Z833 Family history of diabetes mellitus: Secondary | ICD-10-CM | POA: Insufficient documentation

## 2020-04-16 DIAGNOSIS — Z7984 Long term (current) use of oral hypoglycemic drugs: Secondary | ICD-10-CM | POA: Diagnosis not present

## 2020-04-16 DIAGNOSIS — C7951 Secondary malignant neoplasm of bone: Secondary | ICD-10-CM | POA: Insufficient documentation

## 2020-04-16 DIAGNOSIS — N644 Mastodynia: Secondary | ICD-10-CM | POA: Diagnosis not present

## 2020-04-16 DIAGNOSIS — E119 Type 2 diabetes mellitus without complications: Secondary | ICD-10-CM | POA: Diagnosis not present

## 2020-04-16 DIAGNOSIS — I10 Essential (primary) hypertension: Secondary | ICD-10-CM | POA: Diagnosis not present

## 2020-04-16 DIAGNOSIS — C7800 Secondary malignant neoplasm of unspecified lung: Secondary | ICD-10-CM | POA: Insufficient documentation

## 2020-04-16 DIAGNOSIS — E669 Obesity, unspecified: Secondary | ICD-10-CM | POA: Diagnosis not present

## 2020-04-16 DIAGNOSIS — F17210 Nicotine dependence, cigarettes, uncomplicated: Secondary | ICD-10-CM | POA: Diagnosis not present

## 2020-04-16 DIAGNOSIS — Z79899 Other long term (current) drug therapy: Secondary | ICD-10-CM | POA: Diagnosis not present

## 2020-04-16 DIAGNOSIS — Z7982 Long term (current) use of aspirin: Secondary | ICD-10-CM | POA: Diagnosis not present

## 2020-04-16 DIAGNOSIS — Z807 Family history of other malignant neoplasms of lymphoid, hematopoietic and related tissues: Secondary | ICD-10-CM | POA: Diagnosis not present

## 2020-04-16 DIAGNOSIS — Z801 Family history of malignant neoplasm of trachea, bronchus and lung: Secondary | ICD-10-CM | POA: Insufficient documentation

## 2020-04-16 DIAGNOSIS — Z7952 Long term (current) use of systemic steroids: Secondary | ICD-10-CM | POA: Insufficient documentation

## 2020-04-16 LAB — CBC WITH DIFFERENTIAL/PLATELET
Abs Immature Granulocytes: 0.02 10*3/uL (ref 0.00–0.07)
Basophils Absolute: 0 10*3/uL (ref 0.0–0.1)
Basophils Relative: 0 %
Eosinophils Absolute: 0.3 10*3/uL (ref 0.0–0.5)
Eosinophils Relative: 3 %
HCT: 34 % — ABNORMAL LOW (ref 39.0–52.0)
Hemoglobin: 11.3 g/dL — ABNORMAL LOW (ref 13.0–17.0)
Immature Granulocytes: 0 %
Lymphocytes Relative: 39 %
Lymphs Abs: 4.2 10*3/uL — ABNORMAL HIGH (ref 0.7–4.0)
MCH: 29 pg (ref 26.0–34.0)
MCHC: 33.2 g/dL (ref 30.0–36.0)
MCV: 87.4 fL (ref 80.0–100.0)
Monocytes Absolute: 0.9 10*3/uL (ref 0.1–1.0)
Monocytes Relative: 8 %
Neutro Abs: 5.3 10*3/uL (ref 1.7–7.7)
Neutrophils Relative %: 50 %
Platelets: 344 10*3/uL (ref 150–400)
RBC: 3.89 MIL/uL — ABNORMAL LOW (ref 4.22–5.81)
RDW: 15.6 % — ABNORMAL HIGH (ref 11.5–15.5)
WBC: 10.6 10*3/uL — ABNORMAL HIGH (ref 4.0–10.5)
nRBC: 0 % (ref 0.0–0.2)

## 2020-04-16 LAB — COMPREHENSIVE METABOLIC PANEL
ALT: 19 U/L (ref 0–44)
AST: 28 U/L (ref 15–41)
Albumin: 3.4 g/dL — ABNORMAL LOW (ref 3.5–5.0)
Alkaline Phosphatase: 183 U/L — ABNORMAL HIGH (ref 38–126)
Anion gap: 11 (ref 5–15)
BUN: 12 mg/dL (ref 8–23)
CO2: 23 mmol/L (ref 22–32)
Calcium: 9.4 mg/dL (ref 8.9–10.3)
Chloride: 104 mmol/L (ref 98–111)
Creatinine, Ser: 1.17 mg/dL (ref 0.61–1.24)
GFR, Estimated: >60 mL/min (ref >60)
Glucose, Bld: 119 mg/dL — ABNORMAL HIGH (ref 70–99)
Potassium: 3.9 mmol/L (ref 3.5–5.1)
Sodium: 138 mmol/L (ref 135–145)
Total Bilirubin: 0.6 mg/dL (ref 0.3–1.2)
Total Protein: 6.6 g/dL (ref 6.5–8.1)

## 2020-04-16 LAB — PSA: Prostatic Specific Antigen: 2.68 ng/mL (ref 0.00–4.00)

## 2020-04-16 MED ORDER — LEUPROLIDE ACETATE (6 MONTH) 45 MG ~~LOC~~ KIT
45.0000 mg | PACK | Freq: Once | SUBCUTANEOUS | Status: AC
  Administered 2020-04-16: 45 mg via SUBCUTANEOUS
  Filled 2020-04-16: qty 45

## 2020-04-16 NOTE — Assessment & Plan Note (Addendum)
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  PROSTATE ADENOCARCINOMA- castrate sensitive; November 2021 PET scan-multiple lung lesions multiple bone lesions and prostate uptake.  s/p prostate biopsy- will order foundation one . STABLE; PSA improving.  Again reviewed with the daughter/patient reviewed the results of the biopsy; rationale for the biopsy.  Also reviewed the importance of foundation 1/NGS.  #Continue ADT-Eligard today.;  Continue Zytiga 4 pills plus prednisone [empty stomach]; continue Z+P  # Breast tenderness-grade 1-2.  Stable sec to ADT- continue tramadol prn; add vaseline BID.   # DISPOSITION: # Eligard  today # Follow up in 4 weeks MD; cbc/cmp/PSA; - Dr.B

## 2020-04-16 NOTE — Progress Notes (Signed)
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   # NOV 2021- CTA [ER] Diffuse bilateral pulmonary nodules including a cavitary lesion in the right upper lobe measuring approximately 2 cm. There is extensive nodular pleural thickening bilaterally, greatest in the left lower lobe.  November 2021 PET scan-multiple lung lesions multiple bone lesions and prostate uptake.    # DM-2; NO COPD [?]; ACTIVE SMOKER 65ppd.  # DEC 2nd week- FIRMAGON; # JAN 12TH, 2022-ZYTIGA  1000 MG+ PRED 5MG; FEB MID 2022- ELIGARD q6M  # NGS/MOLECULAR TESTS:    # PALLIATIVE CARE EVALUATION:  # PAIN MANAGEMENT:    DIAGNOSIS: Prostate cancer  STAGE:   IV      ;  GOALS: Palliative  CURRENT/MOST RECENT THERAPY : ADT+ ZYTIGA    Prostate cancer metastatic to multiple sites [facility])  02/04/2020 Initial Diagnosis   Prostate cancer metastatic to multiple sites [facility])   03/19/2020 Cancer Staging   Staging form: Prostate, AJCC 8th Edition - Clinical: Stage IVB (pM1b) - Signed by Cammie Sickle, MD on 03/19/2020    Genetic Testing   Negative genetic testing. No pathogenic variants identified on the [facility] Multi-Cancer Panel. VUS in AXIN2 called c.1235A>C identified. The report date is 03/23/2020.   The Multi-Cancer Panel offered by [facility] includes sequencing and/or deletion duplication testing of the following 84 genes: AIP, ALK, APC, ATM, AXIN2,BAP1,  BARD1, BLM, BMPR1A, BRCA1, BRCA2, BRIP1, CASR, CDC73, CDH1, CDK4, CDKN1B, CDKN1C, CDKN2A (p14ARF), CDKN2A (p16INK4a), CEBPA, CHEK2, CTNNA1, DICER1, DIS3L2, EGFR (c.2369C>T, p.Thr790Met variant only), EPCAM (Deletion/duplication testing only), FH, FLCN, GATA2, GPC3, GREM1 (Promoter  region deletion/duplication testing only), HOXB13 (c.251G>A, p.Gly84Glu), HRAS, KIT, MAX, MEN1, MET, MITF (c.952G>A, p.Glu318Lys variant only), MLH1, MSH2, MSH3, MSH6, MUTYH, NBN, NF1, NF2, NTHL1, PALB2, PDGFRA, PHOX2B, PMS2, POLD1, POLE, POT1, PRKAR1A, PTCH1, PTEN, RAD50, RAD51C, RAD51D, RB1, RECQL4, RET, RUNX1, SDHAF2, SDHA (sequence changes only), SDHB, SDHC, SDHD, SMAD4, SMARCA4, SMARCB1, SMARCE1, STK11, SUFU, TERC, TERT, TMEM127, TP53, TSC1, TSC2, VHL, WRN and WT1.       HISTORY OF PRESENTING ILLNESS:  Brent Glass 80 y.o.  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  Patient in the interim underwent prostate biopsy.  Procedure  Was uneventful.  Patient continues to complain of mild breast tenderness.  On the right side.  Taking tramadol.  Not any worse.  No swelling in the legs.  No nausea vomiting.  No diarrhea.   Review of Systems  Constitutional: Positive for weight loss. Negative for chills, diaphoresis, fever and malaise/fatigue.  HENT: Negative for nosebleeds and sore throat.   Eyes: Negative for double vision.  Respiratory: Positive for cough. Negative for hemoptysis, sputum production, shortness of breath and wheezing.   Cardiovascular: Negative for chest pain, palpitations, orthopnea and leg swelling.  Gastrointestinal: Negative for abdominal pain, blood in stool, constipation, diarrhea, heartburn, melena, nausea and vomiting.  Genitourinary: Negative for dysuria, frequency and urgency.  Musculoskeletal: Positive for back pain. Negative for joint pain.  Skin: Negative.  Negative for itching and rash.  Neurological: Negative for dizziness, tingling, focal weakness, weakness and headaches.  Endo/Heme/Allergies: Does not bruise/bleed easily.  Psychiatric/Behavioral: Negative for depression. The patient is not nervous/anxious and does not have  insomnia.      MEDICAL HISTORY:  Past Medical History:  Diagnosis  Date  . Arthritis   . BPH (benign prostatic hyperplasia)   . BPH with obstruction/lower urinary tract symptoms   . Diabetes ([place])   . Disorder resulting from impaired renal function   . Elevated PSA   . Family history of lung cancer   . Family history of lymphoma   . Heart murmur   . Hypertension   . Knee pain   . Lesion of lung   . Low HDL (under 40)   . Obesity   . Urinary hesitancy     SURGICAL HISTORY: Past Surgical History:  Procedure Laterality Date  . HERNIA REPAIR  2774   Umbilical Hernia Repair  . KNEE ARTHROSCOPY Left 04/10/2015   Procedure: ARTHROSCOPY KNEE, partial medial meniscectomy, partial synovectomy;  Surgeon: Hessie Knows, MD;  Location: [facility] ORS;  Service: Orthopedics;  Laterality: Left;    SOCIAL HISTORY: Social History   Socioeconomic History  . Marital status: Widowed    Spouse name: Not on file  . Number of children: Not on file  . Years of education: Not on file  . Highest education level: Not on file  Occupational History  . Not on file  Tobacco Use  . Smoking status: Current Every Day Smoker    Packs/day: 1.00    Types: Cigarettes  . Smokeless tobacco: Never Used  Vaping Use  . Vaping Use: Never used  Substance and Sexual Activity  . Alcohol use: No    Alcohol/week: 0.0 standard drinks  . Drug use: No  . Sexual activity: Not Currently  Other Topics Concern  . Not on file  Social History Narrative   Lives in [place]; self; daughter lives 1 mile. Smoke 1ppd > 65 years; stopped 25 years ago.  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    Social Determinants of Health   Financial Resource Strain: Not on file  Food Insecurity: Not on file  Transportation Needs: Not on file  Physical Activity: Not on file  Stress: Not on file  Social Connections: Not on file  Intimate Partner Violence: Not on file    FAMILY HISTORY: Family History  Problem Relation Age of Onset  . Diabetes Mother   . Lung cancer Mother   .  Lymphoma Son 28       died in 35.   . Kidney disease Neg Hx   . Prostate cancer Neg Hx     ALLERGIES:  has No Known Allergies.  MEDICATIONS:  Current Outpatient Medications  Medication Sig Dispense Refill  . abiraterone acetate (ZYTIGA) 250 MG tablet Take 4 tablets (1,000 mg total) by mouth daily. Take on an empty stomach 1 hour before or 2 hours after a meal 120 tablet 0  . acetaminophen (TYLENOL) 650 MG CR tablet Take 650 mg by mouth every 8 (eight) hours as needed for pain.    Marland Kitchen albuterol (PROVENTIL HFA;VENTOLIN HFA) 108 (90 BASE) MCG/ACT inhaler Inhale 2 puffs into the lungs every 6 (six) hours as needed for wheezing or shortness of breath.    Marland Kitchen aspirin 81 MG tablet Take 81 mg by mouth daily.    . finasteride (PROSCAR) 5 MG tablet TAKE 1 TABLET BY MOUTH EVERY DAY 90 tablet 3  . glipiZIDE (GLUCOTROL XL) 10 MG 24 hr tablet 1 BY MOUTH DAILY FOR DIABETES  2  . lisinopril-hydrochlorothiazide (PRINZIDE,ZESTORETIC) 20-25 MG per tablet Take 1 tablet by mouth daily.    Marland Kitchen  metFORMIN (GLUCOPHAGE) 1000 MG tablet Take 1,000 mg by mouth 2 (two) times daily with a meal.    . predniSONE (DELTASONE) 5 MG tablet Take 1 tablet (5 mg total) by mouth daily with breakfast. 30 tablet 3  . simvastatin (ZOCOR) 40 MG tablet Take 40 mg by mouth daily.    . TRADJENTA 5 MG TABS tablet Take 5 mg by mouth daily.    . traMADol (ULTRAM) 50 MG tablet Take 1 tablet (50 mg total) by mouth every 6 (six) hours as needed for moderate pain or severe pain. 30 tablet 0   No current facility-administered medications for this visit.   Facility-Administered Medications Ordered in Other Visits  Medication Dose Route Frequency Provider Last Rate Last Admin  . leuprolide (6 Month) (ELIGARD) injection 45 mg  45 mg Subcutaneous Once Charlaine Dalton R, MD          .  PHYSICAL EXAMINATION: ECOG PERFORMANCE STATUS: 0 - Asymptomatic  Vitals:   04/16/20 0934  BP: 109/64  Pulse: 86  Temp: 98.3 F (36.8 C)  SpO2: 100%    Filed Weights   04/16/20 0934  Weight: 212 lb 6 oz (96.3 kg)    Physical Exam Constitutional:      Comments: Ambulating independently.  Accompanied by his daughter.  HENT:     Head: Normocephalic and atraumatic.     Mouth/Throat:     Pharynx: No oropharyngeal exudate.  Eyes:     Pupils: Pupils are equal, round, and reactive to light.  Cardiovascular:     Rate and Rhythm: Normal rate and regular rhythm.  Pulmonary:     Effort: No respiratory distress.     Breath sounds: No wheezing.     Comments: Decreased breath sounds bilaterally. Abdominal:     General: Bowel sounds are normal. There is no distension.     Palpations: Abdomen is soft. There is no mass.     Tenderness: There is no abdominal tenderness. There is no guarding or rebound.  Musculoskeletal:        General: No tenderness. Normal range of motion.     Cervical back: Normal range of motion and neck supple.  Skin:    General: Skin is warm.  Neurological:     Mental Status: He is alert and oriented to person, place, and time.  Psychiatric:        Mood and Affect: Affect normal.      LABORATORY DATA:  I have reviewed the data as listed Lab Results  Component Value Date   WBC 10.6 (H) 04/16/2020   HGB 11.3 (L) 04/16/2020   HCT 34.0 (L) 04/16/2020   MCV 87.4 04/16/2020   PLT 344 04/16/2020   Recent Labs    03/10/20 1411 03/19/20 0919 04/16/20 0910  NA 138 137 138  K 4.2 4.1 3.9  CL 102 101 104  CO2 24 27 23   GLUCOSE 100* 82 119*  BUN 18 14 12   CREATININE 1.10 1.07 1.17  CALCIUM 9.3 9.5 9.4  GFRNONAA >60 >60 >60  PROT 6.9 6.9 6.6  ALBUMIN 4.0 3.8 3.4*  AST 15 13* 28  ALT 16 12 19   ALKPHOS 317* 273* 183*  BILITOT 0.5 0.5 0.6    RADIOGRAPHIC STUDIES: I have personally reviewed the radiological images as listed and agreed with the findings in the report. No results found.  ASSESSMENT & PLAN:   Prostate cancer metastatic to multiple sites [facility]) #Clinically-prostate cancer castrate  sensitive metastatic to lung/bone [NOV PSA 900+; no biopsy].  PROSTATE ADENOCARCINOMA- castrate sensitive; November 2021 PET scan-multiple lung lesions multiple bone lesions and prostate uptake.  s/p prostate biopsy- will order foundation one . STABLE; PSA improving.  Again reviewed with the daughter/patient reviewed the results of the biopsy; rationale for the biopsy.  Also reviewed the importance of foundation 1/NGS.  #Continue ADT-Eligard today.;  Continue Zytiga 4 pills plus prednisone [empty stomach]; continue Z+P  # Breast tenderness-grade 1-2.  Stable sec to ADT- continue tramadol prn; add vaseline BID.   # DISPOSITION: # Eligard  today # Follow up in 4 weeks MD; cbc/cmp/PSA; - Dr.B    All questions were answered. The patient knows to call the clinic with any problems, questions or concerns.    Cammie Sickle, MD 04/16/2020 10:13 AM

## 2020-04-17 ENCOUNTER — Telehealth: Payer: Self-pay | Admitting: *Deleted

## 2020-04-17 NOTE — Telephone Encounter (Signed)
Foundation One testing submitted per md order

## 2020-04-21 NOTE — Telephone Encounter (Signed)
Patient signed attestation form on 04/16/20 and was faxed to [facility].  Will call [employer] to verify they received the attestation and is fully approved for assistance until 03/07/21.  [place] Patient [place] Phone [phone] Fax [phone] 04/21/2020 11:29 AM

## 2020-05-01 ENCOUNTER — Encounter: Payer: Self-pay | Admitting: Internal Medicine

## 2020-05-01 ENCOUNTER — Encounter: Payer: Self-pay | Admitting: Pathology

## 2020-05-05 ENCOUNTER — Other Ambulatory Visit: Payer: Self-pay | Admitting: Nurse Practitioner

## 2020-05-14 ENCOUNTER — Inpatient Hospital Stay: Payer: Medicare Other | Attending: Internal Medicine

## 2020-05-14 ENCOUNTER — Inpatient Hospital Stay (HOSPITAL_BASED_OUTPATIENT_CLINIC_OR_DEPARTMENT_OTHER): Payer: Medicare Other | Admitting: Internal Medicine

## 2020-05-14 ENCOUNTER — Encounter: Payer: Self-pay | Admitting: Internal Medicine

## 2020-05-14 DIAGNOSIS — E876 Hypokalemia: Secondary | ICD-10-CM | POA: Insufficient documentation

## 2020-05-14 DIAGNOSIS — E119 Type 2 diabetes mellitus without complications: Secondary | ICD-10-CM | POA: Insufficient documentation

## 2020-05-14 DIAGNOSIS — C61 Malignant neoplasm of prostate: Secondary | ICD-10-CM | POA: Diagnosis present

## 2020-05-14 DIAGNOSIS — Z801 Family history of malignant neoplasm of trachea, bronchus and lung: Secondary | ICD-10-CM | POA: Insufficient documentation

## 2020-05-14 DIAGNOSIS — I10 Essential (primary) hypertension: Secondary | ICD-10-CM | POA: Diagnosis not present

## 2020-05-14 DIAGNOSIS — Z7984 Long term (current) use of oral hypoglycemic drugs: Secondary | ICD-10-CM | POA: Insufficient documentation

## 2020-05-14 DIAGNOSIS — Z833 Family history of diabetes mellitus: Secondary | ICD-10-CM | POA: Insufficient documentation

## 2020-05-14 DIAGNOSIS — C7951 Secondary malignant neoplasm of bone: Secondary | ICD-10-CM | POA: Diagnosis not present

## 2020-05-14 DIAGNOSIS — Z79899 Other long term (current) drug therapy: Secondary | ICD-10-CM | POA: Insufficient documentation

## 2020-05-14 DIAGNOSIS — Z7952 Long term (current) use of systemic steroids: Secondary | ICD-10-CM | POA: Insufficient documentation

## 2020-05-14 DIAGNOSIS — C7800 Secondary malignant neoplasm of unspecified lung: Secondary | ICD-10-CM | POA: Insufficient documentation

## 2020-05-14 DIAGNOSIS — Z7982 Long term (current) use of aspirin: Secondary | ICD-10-CM | POA: Diagnosis not present

## 2020-05-14 DIAGNOSIS — Z807 Family history of other malignant neoplasms of lymphoid, hematopoietic and related tissues: Secondary | ICD-10-CM | POA: Diagnosis not present

## 2020-05-14 DIAGNOSIS — F17210 Nicotine dependence, cigarettes, uncomplicated: Secondary | ICD-10-CM | POA: Insufficient documentation

## 2020-05-14 LAB — COMPREHENSIVE METABOLIC PANEL
ALT: 14 U/L (ref 0–44)
AST: 18 U/L (ref 15–41)
Albumin: 3.5 g/dL (ref 3.5–5.0)
Alkaline Phosphatase: 135 U/L — ABNORMAL HIGH (ref 38–126)
Anion gap: 12 (ref 5–15)
BUN: 11 mg/dL (ref 8–23)
CO2: 27 mmol/L (ref 22–32)
Calcium: 9.2 mg/dL (ref 8.9–10.3)
Chloride: 103 mmol/L (ref 98–111)
Creatinine, Ser: 1.19 mg/dL (ref 0.61–1.24)
GFR, Estimated: >60 mL/min (ref >60)
Glucose, Bld: 72 mg/dL (ref 70–99)
Potassium: 2.5 mmol/L — CL (ref 3.5–5.1)
Sodium: 142 mmol/L (ref 135–145)
Total Bilirubin: 0.7 mg/dL (ref 0.3–1.2)
Total Protein: 6.8 g/dL (ref 6.5–8.1)

## 2020-05-14 LAB — CBC WITH DIFFERENTIAL/PLATELET
Abs Immature Granulocytes: 0.03 10*3/uL (ref 0.00–0.07)
Basophils Absolute: 0.1 10*3/uL (ref 0.0–0.1)
Basophils Relative: 1 %
Eosinophils Absolute: 0.3 10*3/uL (ref 0.0–0.5)
Eosinophils Relative: 3 %
HCT: 35.6 % — ABNORMAL LOW (ref 39.0–52.0)
Hemoglobin: 11.7 g/dL — ABNORMAL LOW (ref 13.0–17.0)
Immature Granulocytes: 0 %
Lymphocytes Relative: 34 %
Lymphs Abs: 3 10*3/uL (ref 0.7–4.0)
MCH: 29.3 pg (ref 26.0–34.0)
MCHC: 32.9 g/dL (ref 30.0–36.0)
MCV: 89.2 fL (ref 80.0–100.0)
Monocytes Absolute: 0.9 10*3/uL (ref 0.1–1.0)
Monocytes Relative: 10 %
Neutro Abs: 4.7 10*3/uL (ref 1.7–7.7)
Neutrophils Relative %: 52 %
Platelets: 364 10*3/uL (ref 150–400)
RBC: 3.99 MIL/uL — ABNORMAL LOW (ref 4.22–5.81)
RDW: 14.9 % (ref 11.5–15.5)
WBC: 8.9 10*3/uL (ref 4.0–10.5)
nRBC: 0 % (ref 0.0–0.2)

## 2020-05-14 LAB — PSA: Prostatic Specific Antigen: 1.21 ng/mL (ref 0.00–4.00)

## 2020-05-14 MED ORDER — POTASSIUM CHLORIDE CRYS ER 20 MEQ PO TBCR: EXTENDED_RELEASE_TABLET | ORAL | 0 refills | Status: DC

## 2020-05-14 NOTE — Assessment & Plan Note (Addendum)
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  PROSTATE ADENOCARCINOMA- castrate sensitive; November 2021 PET scan-multiple lung lesions multiple bone lesions and prostate uptake.  Stable.  PSA improving February 2.9  # HOLD ZYTIGA+ prednisone sec to severe hypokalemia-see below.  # severe hypokalemia 2.5.  Start K-Dur 20 mg twice a day.  Also discussed regarding potassium rich foods.  # Breast tenderness-grade 1-2.  Stable sec to ADT- continue tramadol prn; add vaseline BID.   # DISPOSITION: # Follow up in 2 weeks MD- BMP/Mag Only- Dr.B

## 2020-05-14 NOTE — Patient Instructions (Addendum)
Stop taking Zytiga and prednisone-until next visit.  #Also stop lisinopril-hydrochlorothiazide-until next visit  #Start taking potassium pills as recommended

## 2020-05-14 NOTE — Progress Notes (Signed)
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   # NOV 2021- CTA [ER] Diffuse bilateral pulmonary nodules including a cavitary lesion in the right upper lobe measuring approximately 2 cm. There is extensive nodular pleural thickening bilaterally, greatest in the left lower lobe.  November 2021 PET scan-multiple lung lesions multiple bone lesions and prostate uptake.    # DM-2; NO COPD [?]; ACTIVE SMOKER 65ppd.  # DEC 2nd week- FIRMAGON; # JAN 12TH, 2022-ZYTIGA  1000 MG+ PRED 5MG; FEB MID 2022- ELIGARD q6M  # NGS/MOLECULAR TESTS: MARCH 2022-foundation 1 no targetable mutation./Genetic evaluation negative    # PALLIATIVE CARE EVALUATION:  # PAIN MANAGEMENT:    DIAGNOSIS: Prostate cancer  STAGE:   IV      ;  GOALS: Palliative  CURRENT/MOST RECENT THERAPY : ADT+ ZYTIGA    Prostate cancer metastatic to multiple sites [facility])  02/04/2020 Initial Diagnosis   Prostate cancer metastatic to multiple sites [facility])   03/19/2020 Cancer Staging   Staging form: Prostate, AJCC 8th Edition - Clinical: Stage IVB (pM1b) - Signed by Cammie Sickle, MD on 03/19/2020    Genetic Testing   Negative genetic testing. No pathogenic variants identified on the [facility] Multi-Cancer Panel. VUS in AXIN2 called c.1235A>C identified. The report date is 03/23/2020.   The Multi-Cancer Panel offered by [facility] includes sequencing and/or deletion duplication testing of the following 84 genes: AIP, ALK, APC, ATM, AXIN2,BAP1,  BARD1, BLM, BMPR1A, BRCA1, BRCA2, BRIP1, CASR, CDC73, CDH1, CDK4, CDKN1B, CDKN1C, CDKN2A (p14ARF), CDKN2A (p16INK4a), CEBPA, CHEK2, CTNNA1, DICER1, DIS3L2, EGFR (c.2369C>T, p.Thr790Met variant only), EPCAM  (Deletion/duplication testing only), FH, FLCN, GATA2, GPC3, GREM1 (Promoter region deletion/duplication testing only), HOXB13 (c.251G>A, p.Gly84Glu), HRAS, KIT, MAX, MEN1, MET, MITF (c.952G>A, p.Glu318Lys variant only), MLH1, MSH2, MSH3, MSH6, MUTYH, NBN, NF1, NF2, NTHL1, PALB2, PDGFRA, PHOX2B, PMS2, POLD1, POLE, POT1, PRKAR1A, PTCH1, PTEN, RAD50, RAD51C, RAD51D, RB1, RECQL4, RET, RUNX1, SDHAF2, SDHA (sequence changes only), SDHB, SDHC, SDHD, SMAD4, SMARCA4, SMARCB1, SMARCE1, STK11, SUFU, TERC, TERT, TMEM127, TP53, TSC1, TSC2, VHL, WRN and WT1.       HISTORY OF PRESENTING ILLNESS:  Brent Glass 80 y.o.  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  Patient denies any blood in stools or black or stools.  Denies any diarrhea.  Denies any nausea vomiting.  Denies any swelling in the legs.  Continues to have mild breast tenderness not any worse.  No nausea no vomiting.   Review of Systems  Constitutional: Positive for weight loss. Negative for chills, diaphoresis, fever and malaise/fatigue.  HENT: Negative for nosebleeds and sore throat.   Eyes: Negative for double vision.  Respiratory: Positive for cough. Negative for hemoptysis, sputum production, shortness of breath and wheezing.   Cardiovascular: Negative for chest pain, palpitations, orthopnea and leg swelling.  Gastrointestinal: Negative for abdominal pain, blood in stool, constipation, diarrhea, heartburn, melena, nausea and vomiting.  Genitourinary: Negative for dysuria, frequency and urgency.  Musculoskeletal: Positive for back pain. Negative for joint pain.  Skin: Negative.  Negative for itching and rash.  Neurological: Negative for dizziness, tingling, focal weakness, weakness and headaches.  Endo/Heme/Allergies: Does not bruise/bleed easily.  Psychiatric/Behavioral: Negative for depression. The patient is not nervous/anxious and  does not have insomnia.       MEDICAL HISTORY:  Past Medical History:  Diagnosis Date  . Arthritis   . BPH (benign prostatic hyperplasia)   . BPH with obstruction/lower urinary tract symptoms   . Diabetes ([place])   . Disorder resulting from impaired renal function   . Elevated PSA   . Family history of lung cancer   . Family history of lymphoma   . Heart murmur   . Hypertension   . Knee pain   . Lesion of lung   . Low HDL (under 40)   . Obesity   . Urinary hesitancy     SURGICAL HISTORY: Past Surgical History:  Procedure Laterality Date  . HERNIA REPAIR  9811   Umbilical Hernia Repair  . KNEE ARTHROSCOPY Left 04/10/2015   Procedure: ARTHROSCOPY KNEE, partial medial meniscectomy, partial synovectomy;  Surgeon: Hessie Knows, MD;  Location: [facility] ORS;  Service: Orthopedics;  Laterality: Left;    SOCIAL HISTORY: Social History   Socioeconomic History  . Marital status: Widowed    Spouse name: Not on file  . Number of children: Not on file  . Years of education: Not on file  . Highest education level: Not on file  Occupational History  . Not on file  Tobacco Use  . Smoking status: Current Every Day Smoker    Packs/day: 1.00    Types: Cigarettes  . Smokeless tobacco: Never Used  Vaping Use  . Vaping Use: Never used  Substance and Sexual Activity  . Alcohol use: No    Alcohol/week: 0.0 standard drinks  . Drug use: No  . Sexual activity: Not Currently  Other Topics Concern  . Not on file  Social History Narrative   Lives in [place]; self; daughter lives 1 mile. Smoke 1ppd > 65 years; stopped 25 years ago.  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    Social Determinants of Health   Financial Resource Strain: Not on file  Food Insecurity: Not on file  Transportation Needs: Not on file  Physical Activity: Not on file  Stress: Not on file  Social Connections: Not on file  Intimate Partner Violence: Not on file    FAMILY HISTORY: Family History  Problem Relation Age  of Onset  . Diabetes Mother   . Lung cancer Mother   . Lymphoma Son 28       died in 37.   . Kidney disease Neg Hx   . Prostate cancer Neg Hx     ALLERGIES:  has No Known Allergies.  MEDICATIONS:  Current Outpatient Medications  Medication Sig Dispense Refill  . abiraterone acetate (ZYTIGA) 250 MG tablet Take 4 tablets (1,000 mg total) by mouth daily. Take on an empty stomach 1 hour before or 2 hours after a meal 120 tablet 0  . acetaminophen (TYLENOL) 650 MG CR tablet Take 650 mg by mouth every 8 (eight) hours as needed for pain.    Marland Kitchen albuterol (PROVENTIL HFA;VENTOLIN HFA) 108 (90 BASE) MCG/ACT inhaler Inhale 2 puffs into the lungs every 6 (six) hours as needed for wheezing or shortness of breath.    Marland Kitchen aspirin 81 MG tablet Take 81 mg by mouth daily.    . finasteride (PROSCAR) 5 MG tablet TAKE 1 TABLET BY MOUTH EVERY DAY 90 tablet 3  . glipiZIDE (GLUCOTROL XL) 10 MG 24 hr tablet 1 BY MOUTH DAILY FOR DIABETES  2  . lisinopril-hydrochlorothiazide (ZESTORETIC) 10-12.5 MG tablet Take 1 tablet by mouth daily.    Marland Kitchen  metFORMIN (GLUCOPHAGE) 1000 MG tablet Take 1,000 mg by mouth 2 (two) times daily with a meal.    . potassium chloride SA (KLOR-CON) 20 MEQ tablet 1 pill twice a day 40 tablet 0  . predniSONE (DELTASONE) 5 MG tablet Take 1 tablet (5 mg total) by mouth daily with breakfast. 30 tablet 3  . simvastatin (ZOCOR) 40 MG tablet Take 40 mg by mouth daily.    . TRADJENTA 5 MG TABS tablet Take 5 mg by mouth daily.    . traMADol (ULTRAM) 50 MG tablet TAKE 1 TABLET (50 MG TOTAL) BY MOUTH EVERY 6 (SIX) HOURS AS NEEDED FOR MODERATE PAIN OR SEVERE PAIN 30 tablet 0   No current facility-administered medications for this visit.      Marland Kitchen  PHYSICAL EXAMINATION: ECOG PERFORMANCE STATUS: 0 - Asymptomatic  Vitals:   05/14/20 1014  BP: 139/75  Pulse: 76  Resp: 16  Temp: (!) 97.3 F (36.3 C)  SpO2: 100%   Filed Weights   05/14/20 1014  Weight: 209 lb (94.8 kg)    Physical  Exam Constitutional:      Comments: Ambulating independently.  Accompanied by his daughter.  HENT:     Head: Normocephalic and atraumatic.     Mouth/Throat:     Pharynx: No oropharyngeal exudate.  Eyes:     Pupils: Pupils are equal, round, and reactive to light.  Cardiovascular:     Rate and Rhythm: Normal rate and regular rhythm.  Pulmonary:     Effort: No respiratory distress.     Breath sounds: No wheezing.     Comments: Decreased breath sounds bilaterally. Abdominal:     General: Bowel sounds are normal. There is no distension.     Palpations: Abdomen is soft. There is no mass.     Tenderness: There is no abdominal tenderness. There is no guarding or rebound.  Musculoskeletal:        General: No tenderness. Normal range of motion.     Cervical back: Normal range of motion and neck supple.  Skin:    General: Skin is warm.  Neurological:     Mental Status: He is alert and oriented to person, place, and time.  Psychiatric:        Mood and Affect: Affect normal.      LABORATORY DATA:  I have reviewed the data as listed Lab Results  Component Value Date   WBC 8.9 05/14/2020   HGB 11.7 (L) 05/14/2020   HCT 35.6 (L) 05/14/2020   MCV 89.2 05/14/2020   PLT 364 05/14/2020   Recent Labs    03/19/20 0919 04/16/20 0910 05/14/20 1006  NA 137 138 142  K 4.1 3.9 2.5*  CL 101 104 103  CO2 27 23 27   GLUCOSE 82 119* 72  BUN 14 12 11   CREATININE 1.07 1.17 1.19  CALCIUM 9.5 9.4 9.2  GFRNONAA >60 >60 >60  PROT 6.9 6.6 6.8  ALBUMIN 3.8 3.4* 3.5  AST 13* 28 18  ALT 12 19 14   ALKPHOS 273* 183* 135*  BILITOT 0.5 0.6 0.7    RADIOGRAPHIC STUDIES: I have personally reviewed the radiological images as listed and agreed with the findings in the report. No results found.  ASSESSMENT & PLAN:   Prostate cancer metastatic to multiple sites [facility]) #Clinically-prostate cancer castrate sensitive metastatic to lung/bone [NOV PSA 900+; no biopsy].  PROSTATE ADENOCARCINOMA- castrate  sensitive; November 2021 PET scan-multiple lung lesions multiple bone lesions and prostate uptake.  Stable.  PSA improving  February 2.9  # HOLD ZYTIGA+ prednisone sec to severe hypokalemia-see below.  # severe hypokalemia 2.5.  Start K-Dur 20 mg twice a day.  Also discussed regarding potassium rich foods.  # Breast tenderness-grade 1-2.  Stable sec to ADT- continue tramadol prn; add vaseline BID.   # DISPOSITION: # Follow up in 2 weeks MD- BMP/Mag Only- Dr.B    All questions were answered. The patient knows to call the clinic with any problems, questions or concerns.    Cammie Sickle, MD 05/14/2020 12:09 PM

## 2020-05-14 NOTE — Progress Notes (Signed)
Right side pain in rib area. States it has been going on since he started shots. Not as bad as it used to be. Also has lower back pain on and off.

## 2020-05-27 ENCOUNTER — Emergency Department: Payer: Medicare Other

## 2020-05-27 ENCOUNTER — Inpatient Hospital Stay: Payer: Medicare Other

## 2020-05-27 ENCOUNTER — Inpatient Hospital Stay
Admit: 2020-05-27 | Discharge: 2020-05-27 | Disposition: A | Discharge status: Home / Self Care | Payer: Medicare Other | Attending: Internal Medicine | Admitting: Internal Medicine

## 2020-05-27 ENCOUNTER — Encounter: Payer: Self-pay | Admitting: Emergency Medicine

## 2020-05-27 ENCOUNTER — Other Ambulatory Visit: Payer: Self-pay

## 2020-05-27 ENCOUNTER — Inpatient Hospital Stay
Admission: EM | Admit: 2020-05-27 | Discharge: 2020-05-29 | DRG: 071 | Disposition: A | Discharge status: Home / Self Care | Payer: Medicare Other | Attending: Internal Medicine | Admitting: Internal Medicine

## 2020-05-27 DIAGNOSIS — Z833 Family history of diabetes mellitus: Secondary | ICD-10-CM | POA: Diagnosis not present

## 2020-05-27 DIAGNOSIS — Z515 Encounter for palliative care: Secondary | ICD-10-CM | POA: Diagnosis not present

## 2020-05-27 DIAGNOSIS — Z7982 Long term (current) use of aspirin: Secondary | ICD-10-CM

## 2020-05-27 DIAGNOSIS — G9341 Metabolic encephalopathy: Principal | ICD-10-CM | POA: Diagnosis present

## 2020-05-27 DIAGNOSIS — E119 Type 2 diabetes mellitus without complications: Secondary | ICD-10-CM

## 2020-05-27 DIAGNOSIS — E785 Hyperlipidemia, unspecified: Secondary | ICD-10-CM | POA: Diagnosis present

## 2020-05-27 DIAGNOSIS — N401 Enlarged prostate with lower urinary tract symptoms: Secondary | ICD-10-CM | POA: Diagnosis present

## 2020-05-27 DIAGNOSIS — R531 Weakness: Secondary | ICD-10-CM | POA: Diagnosis present

## 2020-05-27 DIAGNOSIS — I444 Left anterior fascicular block: Secondary | ICD-10-CM | POA: Diagnosis present

## 2020-05-27 DIAGNOSIS — I10 Essential (primary) hypertension: Secondary | ICD-10-CM | POA: Diagnosis present

## 2020-05-27 DIAGNOSIS — E559 Vitamin D deficiency, unspecified: Secondary | ICD-10-CM | POA: Diagnosis present

## 2020-05-27 DIAGNOSIS — F17210 Nicotine dependence, cigarettes, uncomplicated: Secondary | ICD-10-CM | POA: Diagnosis present

## 2020-05-27 DIAGNOSIS — Z79899 Other long term (current) drug therapy: Secondary | ICD-10-CM

## 2020-05-27 DIAGNOSIS — E538 Deficiency of other specified B group vitamins: Secondary | ICD-10-CM | POA: Diagnosis present

## 2020-05-27 DIAGNOSIS — I160 Hypertensive urgency: Secondary | ICD-10-CM | POA: Diagnosis present

## 2020-05-27 DIAGNOSIS — I639 Cerebral infarction, unspecified: Secondary | ICD-10-CM | POA: Diagnosis not present

## 2020-05-27 DIAGNOSIS — N138 Other obstructive and reflux uropathy: Secondary | ICD-10-CM | POA: Diagnosis present

## 2020-05-27 DIAGNOSIS — Z7984 Long term (current) use of oral hypoglycemic drugs: Secondary | ICD-10-CM | POA: Diagnosis not present

## 2020-05-27 DIAGNOSIS — I674 Hypertensive encephalopathy: Secondary | ICD-10-CM | POA: Diagnosis not present

## 2020-05-27 DIAGNOSIS — Z20822 Contact with and (suspected) exposure to covid-19: Secondary | ICD-10-CM | POA: Diagnosis present

## 2020-05-27 DIAGNOSIS — R4182 Altered mental status, unspecified: Secondary | ICD-10-CM

## 2020-05-27 DIAGNOSIS — C61 Malignant neoplasm of prostate: Secondary | ICD-10-CM | POA: Diagnosis present

## 2020-05-27 DIAGNOSIS — E876 Hypokalemia: Secondary | ICD-10-CM | POA: Diagnosis present

## 2020-05-27 DIAGNOSIS — C7951 Secondary malignant neoplasm of bone: Secondary | ICD-10-CM | POA: Diagnosis present

## 2020-05-27 DIAGNOSIS — T5891XA Toxic effect of carbon monoxide from unspecified source, accidental (unintentional), initial encounter: Secondary | ICD-10-CM

## 2020-05-27 DIAGNOSIS — R410 Disorientation, unspecified: Secondary | ICD-10-CM

## 2020-05-27 LAB — BLOOD GAS, ARTERIAL
Acid-Base Excess: 1.7 mmol/L (ref 0.0–2.0)
Bicarbonate: 27.2 mmol/L (ref 20.0–28.0)
FIO2: 1
O2 Saturation: 100 %
Patient temperature: 37
pCO2 arterial: 45 mmHg (ref 32.0–48.0)
pH, Arterial: 7.39 (ref 7.350–7.450)
pO2, Arterial: 417 mmHg — ABNORMAL HIGH (ref 83.0–108.0)

## 2020-05-27 LAB — BLOOD CULTURE ID PANEL (REFLEXED) - BCID2

## 2020-05-27 LAB — COMPREHENSIVE METABOLIC PANEL
ALT: 10 U/L (ref 0–44)
AST: 14 U/L — ABNORMAL LOW (ref 15–41)
Albumin: 4 g/dL (ref 3.5–5.0)
Alkaline Phosphatase: 122 U/L (ref 38–126)
Anion gap: 11 (ref 5–15)
BUN: 13 mg/dL (ref 8–23)
CO2: 23 mmol/L (ref 22–32)
Calcium: 9.8 mg/dL (ref 8.9–10.3)
Chloride: 104 mmol/L (ref 98–111)
Creatinine, Ser: 1.09 mg/dL (ref 0.61–1.24)
GFR, Estimated: >60 mL/min (ref >60)
Glucose, Bld: 184 mg/dL — ABNORMAL HIGH (ref 70–99)
Potassium: 3.6 mmol/L (ref 3.5–5.1)
Sodium: 138 mmol/L (ref 135–145)
Total Bilirubin: 0.6 mg/dL (ref 0.3–1.2)
Total Protein: 7.5 g/dL (ref 6.5–8.1)

## 2020-05-27 LAB — ETHANOL: Alcohol, Ethyl (B): <10 mg/dL (ref <10)

## 2020-05-27 LAB — GLUCOSE, CAPILLARY
Glucose-Capillary: 90 mg/dL (ref 70–99)
Glucose-Capillary: 103 mg/dL — ABNORMAL HIGH (ref 70–99)

## 2020-05-27 LAB — LIPID PANEL
Cholesterol: 215 mg/dL — ABNORMAL HIGH (ref 0–200)
HDL: 60 mg/dL (ref >40)
LDL Cholesterol: 127 mg/dL — ABNORMAL HIGH (ref 0–99)
Total CHOL/HDL Ratio: 3.6 RATIO
Triglycerides: 138 mg/dL (ref <150)
VLDL: 28 mg/dL (ref 0–40)

## 2020-05-27 LAB — COOXEMETRY PANEL
Carboxyhemoglobin: 4.7 % — ABNORMAL HIGH (ref 0.5–1.5)
Methemoglobin: 0.4 % (ref 0.0–1.5)
O2 Saturation: 97.7 %

## 2020-05-27 LAB — CBC
HCT: 41 % (ref 39.0–52.0)
Hemoglobin: 12.5 g/dL — ABNORMAL LOW (ref 13.0–17.0)
MCH: 28.9 pg (ref 26.0–34.0)
MCHC: 30.5 g/dL (ref 30.0–36.0)
MCV: 94.9 fL (ref 80.0–100.0)
Platelets: 339 10*3/uL (ref 150–400)
RBC: 4.32 MIL/uL (ref 4.22–5.81)
RDW: 14.6 % (ref 11.5–15.5)
WBC: 13.8 10*3/uL — ABNORMAL HIGH (ref 4.0–10.5)
nRBC: 0 % (ref 0.0–0.2)

## 2020-05-27 LAB — URINE DRUG SCREEN, QUALITATIVE (ARMC ONLY)
Amphetamines, Ur Screen: NOT DETECTED
Barbiturates, Ur Screen: NOT DETECTED
Benzodiazepine, Ur Scrn: NOT DETECTED
Cannabinoid 50 Ng, Ur ~~LOC~~: NOT DETECTED
Cocaine Metabolite,Ur ~~LOC~~: NOT DETECTED
MDMA (Ecstasy)Ur Screen: NOT DETECTED
Methadone Scn, Ur: NOT DETECTED
Opiate, Ur Screen: NOT DETECTED
Phencyclidine (PCP) Ur S: NOT DETECTED
Tricyclic, Ur Screen: NOT DETECTED

## 2020-05-27 LAB — ECHOCARDIOGRAM COMPLETE
AR max vel: 3.31 cm2
AV Area VTI: 3.34 cm2
AV Area mean vel: 3.32 cm2
AV Mean grad: 3 mmHg
AV Peak grad: 4.4 mmHg
Ao pk vel: 1.05 m/s
Area-P 1/2: 6.9 cm2
Height: 72 in
S' Lateral: 2.75 cm
Weight: 3344 oz

## 2020-05-27 LAB — DIFFERENTIAL
Abs Immature Granulocytes: 0.04 10*3/uL (ref 0.00–0.07)
Basophils Absolute: 0.1 10*3/uL (ref 0.0–0.1)
Basophils Relative: 1 %
Eosinophils Absolute: 0.1 10*3/uL (ref 0.0–0.5)
Eosinophils Relative: 0 %
Immature Granulocytes: 0 %
Lymphocytes Relative: 21 %
Lymphs Abs: 2.9 10*3/uL (ref 0.7–4.0)
Monocytes Absolute: 0.8 10*3/uL (ref 0.1–1.0)
Monocytes Relative: 6 %
Neutro Abs: 9.9 10*3/uL — ABNORMAL HIGH (ref 1.7–7.7)
Neutrophils Relative %: 72 %

## 2020-05-27 LAB — URINALYSIS, ROUTINE W REFLEX MICROSCOPIC
Bacteria, UA: NONE SEEN
Bilirubin Urine: NEGATIVE
Glucose, UA: 50 mg/dL — AB
Ketones, ur: 5 mg/dL — AB
Leukocytes,Ua: NEGATIVE
Nitrite: NEGATIVE
Protein, ur: NEGATIVE mg/dL
Specific Gravity, Urine: 1.013 (ref 1.005–1.030)
Squamous Epithelial / HPF: NONE SEEN (ref 0–5)
pH: 6 (ref 5.0–8.0)

## 2020-05-27 LAB — CBG MONITORING, ED
Glucose-Capillary: 215 mg/dL — ABNORMAL HIGH (ref 70–99)
Glucose-Capillary: 119 mg/dL — ABNORMAL HIGH (ref 70–99)

## 2020-05-27 LAB — RESP PANEL BY RT-PCR (FLU A&B, COVID) ARPGX2
Influenza A by PCR: NEGATIVE
Influenza B by PCR: NEGATIVE
SARS Coronavirus 2 by RT PCR: NEGATIVE

## 2020-05-27 LAB — TROPONIN I (HIGH SENSITIVITY): Troponin I (High Sensitivity): 12 ng/L (ref <18)

## 2020-05-27 LAB — PROTIME-INR
INR: 1 (ref 0.8–1.2)
Prothrombin Time: 12.4 seconds (ref 11.4–15.2)

## 2020-05-27 LAB — APTT: aPTT: 32 seconds (ref 24–36)

## 2020-05-27 LAB — TSH: TSH: 0.383 u[IU]/mL (ref 0.350–4.500)

## 2020-05-27 LAB — VITAMIN B12: Vitamin B-12: <50 pg/mL — ABNORMAL LOW (ref 180–914)

## 2020-05-27 LAB — LACTIC ACID, PLASMA: Lactic Acid, Venous: 1.6 mmol/L (ref 0.5–1.9)

## 2020-05-27 MED ORDER — GADOBUTROL 1 MMOL/ML IV SOLN
7.5000 mL | Freq: Once | INTRAVENOUS | Status: AC | PRN
  Administered 2020-05-27: 7.5 mL via INTRAVENOUS

## 2020-05-27 MED ORDER — INSULIN ASPART 100 UNIT/ML ~~LOC~~ SOLN: 0.0000 [IU] | Freq: Every day | SUBCUTANEOUS | Status: DC

## 2020-05-27 MED ORDER — ABIRATERONE ACETATE 250 MG PO TABS: 1000.0000 mg | ORAL_TABLET | Freq: Every day | ORAL | Status: DC

## 2020-05-27 MED ORDER — SIMVASTATIN 20 MG PO TABS
40.0000 mg | ORAL_TABLET | Freq: Every day | ORAL | Status: DC
  Administered 2020-05-28: 40 mg via ORAL
  Filled 2020-05-27: qty 2

## 2020-05-27 MED ORDER — LORAZEPAM 2 MG/ML IJ SOLN
1.0000 mg | Freq: Once | INTRAMUSCULAR | Status: AC
  Administered 2020-05-27: 1 mg via INTRAVENOUS
  Filled 2020-05-27: qty 1

## 2020-05-27 MED ORDER — TRAMADOL HCL 50 MG PO TABS: 50.0000 mg | ORAL_TABLET | Freq: Four times a day (QID) | ORAL | Status: DC | PRN

## 2020-05-27 MED ORDER — SODIUM CHLORIDE 0.9 % IV SOLN: INTRAVENOUS | Status: DC

## 2020-05-27 MED ORDER — SENNOSIDES-DOCUSATE SODIUM 8.6-50 MG PO TABS: 1.0000 | ORAL_TABLET | Freq: Every evening | ORAL | Status: DC | PRN

## 2020-05-27 MED ORDER — ACETAMINOPHEN 160 MG/5ML PO SOLN
650.0000 mg | ORAL | Status: DC | PRN
  Filled 2020-05-27: qty 20.3

## 2020-05-27 MED ORDER — THIAMINE HCL 100 MG/ML IJ SOLN
500.0000 mg | Freq: Once | INTRAVENOUS | Status: AC
  Administered 2020-05-27: 500 mg via INTRAVENOUS
  Filled 2020-05-27: qty 5

## 2020-05-27 MED ORDER — STROKE: EARLY STAGES OF RECOVERY BOOK: Freq: Once | Status: DC

## 2020-05-27 MED ORDER — ACETAMINOPHEN 650 MG RE SUPP: 650.0000 mg | RECTAL | Status: DC | PRN

## 2020-05-27 MED ORDER — SIMVASTATIN 10 MG PO TABS
40.0000 mg | ORAL_TABLET | Freq: Every day | ORAL | Status: DC
Start: 2020-05-27 — End: 2020-05-27

## 2020-05-27 MED ORDER — LINAGLIPTIN 5 MG PO TABS
5.0000 mg | ORAL_TABLET | Freq: Every day | ORAL | Status: DC
  Administered 2020-05-28 – 2020-05-29 (×2): 5 mg via ORAL
  Filled 2020-05-27 (×2): qty 1

## 2020-05-27 MED ORDER — ACETAMINOPHEN 325 MG PO TABS
650.0000 mg | ORAL_TABLET | ORAL | Status: DC | PRN
  Administered 2020-05-28: 650 mg via ORAL
  Filled 2020-05-27: qty 2

## 2020-05-27 MED ORDER — HEPARIN SODIUM (PORCINE) 5000 UNIT/ML IJ SOLN
5000.0000 [IU] | Freq: Three times a day (TID) | INTRAMUSCULAR | Status: DC
  Administered 2020-05-27 – 2020-05-28 (×6): 5000 [IU] via SUBCUTANEOUS
  Filled 2020-05-27 (×7): qty 1

## 2020-05-27 MED ORDER — LORAZEPAM 2 MG/ML IJ SOLN: 1.0000 mg | Freq: Once | INTRAMUSCULAR | Status: AC

## 2020-05-27 MED ORDER — PREDNISONE 10 MG PO TABS
5.0000 mg | ORAL_TABLET | Freq: Every day | ORAL | Status: DC
  Filled 2020-05-27 (×2): qty 1

## 2020-05-27 MED ORDER — ONDANSETRON HCL 4 MG/2ML IJ SOLN
4.0000 mg | Freq: Once | INTRAMUSCULAR | Status: AC
  Administered 2020-05-27: 4 mg via INTRAVENOUS
  Filled 2020-05-27: qty 2

## 2020-05-27 MED ORDER — INSULIN ASPART 100 UNIT/ML ~~LOC~~ SOLN
0.0000 [IU] | Freq: Three times a day (TID) | SUBCUTANEOUS | Status: DC
  Administered 2020-05-27: 5 [IU] via SUBCUTANEOUS
  Administered 2020-05-28: 3 [IU] via SUBCUTANEOUS
  Filled 2020-05-27 (×2): qty 1

## 2020-05-27 MED ORDER — FINASTERIDE 5 MG PO TABS
5.0000 mg | ORAL_TABLET | Freq: Every day | ORAL | Status: DC
  Administered 2020-05-28 – 2020-05-29 (×2): 5 mg via ORAL
  Filled 2020-05-27 (×2): qty 1

## 2020-05-27 MED ORDER — LORAZEPAM 2 MG/ML IJ SOLN
INTRAMUSCULAR | Status: AC
  Administered 2020-05-27: 1 mg via INTRAVENOUS
  Filled 2020-05-27: qty 1

## 2020-05-27 NOTE — ED Notes (Signed)
Call received from MRI tech, unable to complete MRI scan at this time. Patient continues to be uncooperative/unable to redirect. Dr. Leonides Schanz notified.

## 2020-05-27 NOTE — ED Notes (Addendum)
Pt still resting in bed, daughter at bedside. arousable to verbal stimuli but intermittently follows simple commands. No verbiage that is a comprehensible sentence or thought.  awaiting repeat MRI. Does not follow instructions accurately enough to complete Stroke SS or NIHHS.

## 2020-05-27 NOTE — ED Provider Notes (Signed)
[facility] Emergency Department Provider Note  ____________________________________________   Event Date/Time   First MD Initiated Contact with Patient 05/27/20 0100     (approximate)  I have reviewed the triage vital signs and the nursing notes.   HISTORY  Chief Complaint Headache    HPI Brent Glass is a 80 y.o. male with history of prostate cancer with bony metastasis, hypertension, diabetes who presents to the emergency department with his daughter for concerns for headache, altered mental status.  She reports for the past month that she notices that he has been dragging his left leg with walking.  Over the past week she reports he has been "not right".  She is concerned that it was related to a kerosene heater that he has been running at home.  She states the last time she saw him was today at 5 PM.  Tonight he developed a headache on the right side of his head at about 10 PM.  He took tramadol without relief.  No head injury.  Not on blood thinners.  He denies any chest pain or shortness of breath.  No vomiting, diarrhea.  No fever.  No cough.  He denies numbness or focal weakness.        Past Medical History:  Diagnosis Date  . Arthritis   . BPH (benign prostatic hyperplasia)   . BPH with obstruction/lower urinary tract symptoms   . Diabetes ([place])   . Disorder resulting from impaired renal function   . Elevated PSA   . Family history of lung cancer   . Family history of lymphoma   . Heart murmur   . Hypertension   . Knee pain   . Lesion of lung   . Low HDL (under 40)   . Obesity   . Urinary hesitancy     Patient Active Problem List   Diagnosis Date Noted  . Genetic testing 03/26/2020  . Family history of lung cancer   . Family history of lymphoma   . Prostate cancer metastatic to multiple sites ([place]) 02/04/2020  . Goals of care, counseling/discussion 02/04/2020  . Multiple lung nodules 01/23/2020  . BPH with obstruction/lower  urinary tract symptoms 11/21/2014  . Elevated PSA 11/21/2014    Past Surgical History:  Procedure Laterality Date  . HERNIA REPAIR  4268   Umbilical Hernia Repair  . KNEE ARTHROSCOPY Left 04/10/2015   Procedure: ARTHROSCOPY KNEE, partial medial meniscectomy, partial synovectomy;  Surgeon: Hessie Knows, MD;  Location: [facility] ORS;  Service: Orthopedics;  Laterality: Left;    Prior to Admission medications   Medication Sig Start Date End Date Taking? Authorizing Provider  abiraterone acetate (ZYTIGA) 250 MG tablet Take 4 tablets (1,000 mg total) by mouth daily. Take on an empty stomach 1 hour before or 2 hours after a meal 02/20/20   Cammie Sickle, MD  acetaminophen (TYLENOL) 650 MG CR tablet Take 650 mg by mouth every 8 (eight) hours as needed for pain.    [provider]  albuterol (PROVENTIL HFA;VENTOLIN HFA) 108 (90 BASE) MCG/ACT inhaler Inhale 2 puffs into the lungs every 6 (six) hours as needed for wheezing or shortness of breath.    [provider]  aspirin 81 MG tablet Take 81 mg by mouth daily.    [provider]  finasteride (PROSCAR) 5 MG tablet TAKE 1 TABLET BY MOUTH EVERY DAY 11/09/17   McGowan, Larene Beach A, PA-C  glipiZIDE (GLUCOTROL XL) 10 MG 24 hr tablet 1 BY MOUTH  DAILY FOR DIABETES 11/09/14   [provider]  lisinopril-hydrochlorothiazide (ZESTORETIC) 10-12.5 MG tablet Take 1 tablet by mouth daily. 04/24/20   [provider]  metFORMIN (GLUCOPHAGE) 1000 MG tablet Take 1,000 mg by mouth 2 (two) times daily with a meal.    [provider]  potassium chloride SA (KLOR-CON) 20 MEQ tablet 1 pill twice a day 05/14/20   Cammie Sickle, MD  predniSONE (DELTASONE) 5 MG tablet Take 1 tablet (5 mg total) by mouth daily with breakfast. 03/12/20   Cammie Sickle, MD  simvastatin (ZOCOR) 40 MG tablet Take 40 mg by mouth daily.    [provider]  TRADJENTA 5 MG TABS tablet Take 5 mg by mouth daily. 03/20/20   [provider]  traMADol (ULTRAM) 50 MG tablet TAKE 1 TABLET (50 MG TOTAL) BY MOUTH EVERY 6 (SIX) HOURS AS NEEDED FOR MODERATE PAIN OR SEVERE PAIN 05/06/20   Verlon Au, NP    Allergies Patient has no known allergies.  Family History  Problem Relation Age of Onset  . Diabetes Mother   . Lung cancer Mother   . Lymphoma Son 28       died in 40.   . Kidney disease Neg Hx   . Prostate cancer Neg Hx     Social History Social History   Tobacco Use  . Smoking status: Current Every Day Smoker    Packs/day: 1.00    Types: Cigarettes  . Smokeless tobacco: Never Used  Vaping Use  . Vaping Use: Never used  Substance Use Topics  . Alcohol use: No    Alcohol/week: 0.0 standard drinks  . Drug use: No    Review of Systems Constitutional: No fever. Eyes: No visual changes. ENT: No sore throat. Cardiovascular: Denies chest pain. Respiratory: Denies shortness of breath. Gastrointestinal: No nausea, vomiting, diarrhea. Genitourinary: Negative for dysuria. Musculoskeletal: Negative for back pain. Skin: Negative for rash. Neurological: Negative for focal weakness or numbness.  ____________________________________________   PHYSICAL EXAM:  VITAL SIGNS: ED Triage Vitals [05/27/20 0040]  Enc Vitals Group     BP (!) 199/173     Pulse Rate 85     Resp 18     Temp 99 F (37.2 C)     Temp Source Oral     SpO2 95 %     Weight 209 lb (94.8 kg)     Height 6' (1.829 m)     Head Circumference      Peak Flow      Pain Score 10     Pain Loc      Pain Edu?      Excl. in [place]?    CONSTITUTIONAL: Alert and oriented x3 and responds appropriately to questions but is slow to answer questions and appears intermittently confused.  Elderly, in no distress HEAD: Normocephalic, appears atraumatic EYES: Conjunctivae clear, pupils appear equal, EOM appear intact ENT: normal nose; moist mucous membranes NECK: Supple, normal ROM, no cervical lymphadenopathy, no meningismus CARD: RRR; S1 and  S2 appreciated; no murmurs, no clicks, no rubs, no gallops RESP: Normal chest excursion without splinting or tachypnea; breath sounds clear and equal bilaterally; no wheezes, no rhonchi, no rales, no hypoxia or respiratory distress, speaking full sentences ABD/GI: Normal bowel sounds; non-distended; soft, non-tender, no rebound, no guarding, no peritoneal signs, no hepatosplenomegaly BACK: The back appears normal EXT: Normal ROM in all joints; no deformity noted, no edema; no cyanosis SKIN: Normal color for age and race; warm;  no rash on exposed skin NEURO: Moves all extremities equally, diminished strength in his left upper extremity compared to right with pronator drift, no neglect, no dysmetria to finger-nose testing bilaterally, reports normal sensation diffusely, cranial nerves II through XII intact, normal speech but slow to answer questions, able to stand on his own without assistance PSYCH: The patient's mood and manner are appropriate.  ____________________________________________   LABS (all labs ordered are listed, but only abnormal results are displayed)  Labs Reviewed  CBC - Abnormal; Notable for the following components:      Result Value   WBC 13.8 (*)    Hemoglobin 12.5 (*)    All other components within normal limits  DIFFERENTIAL - Abnormal; Notable for the following components:   Neutro Abs 9.9 (*)    All other components within normal limits  COMPREHENSIVE METABOLIC PANEL - Abnormal; Notable for the following components:   Glucose, Bld 184 (*)    AST 14 (*)    All other components within normal limits  URINALYSIS, ROUTINE W REFLEX MICROSCOPIC - Abnormal; Notable for the following components:   Color, Urine YELLOW (*)    APPearance CLEAR (*)    Glucose, UA 50 (*)    Hgb urine dipstick SMALL (*)    Ketones, ur 5 (*)    All other components within normal limits  COOXEMETRY PANEL - Abnormal; Notable for the following components:   Carboxyhemoglobin 4.7 (*)    All  other components within normal limits  RESP PANEL BY RT-PCR (FLU A&B, COVID) ARPGX2  URINE DRUG SCREEN, QUALITATIVE ([facility] ONLY)  ETHANOL  PROTIME-INR  APTT  TROPONIN I (HIGH SENSITIVITY)   ____________________________________________  EKG   Date: 05/27/2020 00:55  Rate: 84  Rhythm: normal sinus rhythm  QRS Axis: normal  Intervals: Right bundle branch block  ST/T Wave abnormalities: normal  Conduction Disutrbances: none  Narrative Interpretation: Right bundle branch block, no change compared to previous     ____________________________________________  RADIOLOGY I, Rechel Delosreyes, personally viewed and evaluated these images (plain radiographs) as part of my medical decision making, as well as reviewing the written report by the radiologist.  ED MD interpretation: CT head shows no acute abnormality.  Official radiology report(s): DG Chest 2 View  Result Date: 05/27/2020 CLINICAL DATA:  80 year old male with altered mental status. EXAM: CHEST - 2 VIEW COMPARISON:  Chest radiograph dated 01/16/2020. FINDINGS: Left lung base linear atelectasis/scarring. No focal consolidation, pleural effusion, pneumothorax. Mild cardiomegaly. Atherosclerotic calcification of the aortic arch. No acute osseous pathology. IMPRESSION: No active cardiopulmonary disease. Electronically Signed   By: Anner Crete M.D.   On: 05/27/2020 02:46   CT HEAD WO CONTRAST  Result Date: 05/27/2020 CLINICAL DATA:  Right-sided headache with left-sided defects EXAM: CT HEAD WITHOUT CONTRAST TECHNIQUE: Contiguous axial images were obtained from the base of the skull through the vertex without intravenous contrast. COMPARISON:  PET-CT 01/24/2020 FINDINGS: Brain: No evidence of acute infarction, hemorrhage, hydrocephalus, extra-axial collection, visible mass lesion or mass effect. Diffuse prominence of the ventricles, cisterns and sulci compatible with moderate parenchymal volume loss. Incidental note made of a cavum  septum pellucidum et vergae. Patchy areas of white matter hypoattenuation are most compatible with chronic microvascular angiopathy. Benign dural calcifications. Vascular: Atherosclerotic calcification of the carotid siphons and intradural vertebral arteries. No hyperdense vessel. Skull: No calvarial fracture. No scalp swelling or hematoma. Few sclerotic foci seen in the colitis and left occipital condyle as well as the anterior right parietal bone and occipital bone  likely reflecting multifocal osseous metastatic disease in the setting of prostate carcinoma. Sinuses/Orbits: Paranasal sinuses and mastoid air cells are predominantly clear. Orbital structures are unremarkable aside from prior lens extractions. Other: None. IMPRESSION: 1. No acute intracranial findings. 2. Moderate parenchymal volume loss and chronic microvascular angiopathy. 3. Numerous sclerotic foci throughout the calvarium and skull base concerning for osseous metastases in the setting of prostate carcinoma. Electronically Signed   By: Lovena Le M.D.   On: 05/27/2020 01:47    ____________________________________________   PROCEDURES  Procedure(s) performed (including Critical Care):  Procedures  CRITICAL CARE Performed by: Cyril Mourning Cung Masterson   Total critical care time: 65 minutes  Critical care time was exclusive of separately billable procedures and treating other patients.  Critical care was necessary to treat or prevent imminent or life-threatening deterioration.  Critical care was time spent personally by me on the following activities: development of treatment plan with patient and/or surrogate as well as nursing, discussions with consultants, evaluation of patient's response to treatment, examination of patient, obtaining history from patient or surrogate, ordering and performing treatments and interventions, ordering and review of laboratory studies, ordering and review of radiographic studies, pulse oximetry and  re-evaluation of patient's condition.  ____________________________________________   INITIAL IMPRESSION / ASSESSMENT AND PLAN / ED COURSE  As part of my medical decision making, I reviewed the following data within the [place] History obtained from family, Nursing notes reviewed and incorporated, Labs reviewed , EKG interpreted , Old EKG reviewed, Old chart reviewed, Discussed with admitting physician  and Notes from prior ED visits         Patient here with concerns for left-sided weakness, altered mental status.  Daughter reports last seen today at 5 PM but she reports he has not been normal in the past week.  Differential includes CVA, brain metastasis, intracranial hemorrhage, carbon monoxide poisoning, electrolyte derangement, symptomatic anemia, UTI, polypharmacy, hypoglycemia, intoxication.  Will obtain labs, urine, stat head CT, carboxyhemoglobin level.  We will place him on a nonrebreather.  Patient would not be a TPA candidate at this time given he is outside of the window.  He is hypertensive.  Will allow for permissive hypertension at this time unless he has an intracranial hemorrhage.  ED PROGRESS  CT head shows no acute abnormality.  Carboxyhemoglobin level is elevated at 4.7.  He is on a nonrebreather.  Daughter reports he is a smoker.  Per up-to-date " Nonsmokers may have up to 3 percent carboxyhemoglobin at baseline; smokers may have levels of 10 to 15 percent. Levels above these respective values are consistent with CO poisoning."    Given this new weakness in his left arm and complaints of dragging the left leg with walking, will obtain MRI of the brain to evaluate for CVA versus metastasis.  Labs also show leukocytosis with left shift.  He does have an oral temperature of 99.  COVID, flu negative.  Will obtain rectal temperature as well as urinalysis, chest x-ray.  If febrile, will obtain lactic and blood cultures.  3:40 AM  Unable to obtain MRI given  patient is trying to lie on his side.  He is confused and unable to follow commands.  Tried given Ativan without any relief.  Will give Ativan some time to work, reassess and see if we can send him back to MRI.  Chest x-ray and urine showed no sign of infection today.  Rectal temperature is 99.2.  No meningismus on exam.  We will discuss with  hospitalist for admission.  4:04 AM Discussed patient's case with hospitalist, Dr. Damita Dunnings.  I have recommended admission and patient (and family if present) agree with this plan. Admitting physician will place admission orders.   I reviewed all nursing notes, vitals, pertinent previous records and reviewed/interpreted all EKGs, lab and urine results, imaging (as available).   ____________________________________________   FINAL CLINICAL IMPRESSION(S) / ED DIAGNOSES  Final diagnoses:  Left-sided weakness  Altered mental status, unspecified altered mental status type     ED Discharge Orders    None      *Please note:  [facility] was evaluated in Emergency Department on 05/27/2020 for the symptoms described in the history of present illness. He was evaluated in the context of the global COVID-19 pandemic, which necessitated consideration that the patient might be at risk for infection with the SARS-CoV-2 virus that causes COVID-19. Institutional protocols and algorithms that pertain to the evaluation of patients at risk for COVID-19 are in a state of rapid change based on information released by regulatory bodies including the CDC and federal and state organizations. These policies and algorithms were followed during the patient's care in the ED.  Some ED evaluations and interventions may be delayed as a result of limited staffing during and the pandemic.*   Note:  This document was prepared using Dragon voice recognition software and may include unintentional dictation errors.   Faizah Kandler, Delice Bison, DO 05/27/20 [id number]

## 2020-05-27 NOTE — ED Notes (Signed)
Call received from MRI, patient uncooperative and unable to follow commands, continues to lay on his side during MRI exam. Dr. Leonides Schanz notified. Awaiting orders.

## 2020-05-27 NOTE — H&P (Signed)
History and Physical    Brent Glass HMC:[id number] DOB: 04/29/1940 DOA: 05/27/2020  PCP: Donnie Coffin, MD   Patient coming from: Home  I have personally briefly reviewed patient's old medical records in [place]  Chief Complaint: Confusion, left-sided weakness  HPI: Brent Glass is a 80 y.o. 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  Daughter also noticed that patient is somewhat confused.  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  When he called to tell her about the headache he seemed to have trouble getting his thoughts together and told her it took about 30 minutes.  She states she became concerned especially because of a kerosene odor that she got at his house.  He otherwise apart from his chronic medical problems have not been ill.  Has had no chest pain, shortness of breath, cough, fever or chills, no nausea or vomiting or diarrhea or abdominal pain.  At baseline, he has weakness of his left leg for the past several months which led up to his diagnosis of prostate cancer and November 2021. ED course: On arrival, BP 211/105, pulse 87 O2 sat 97% on room air, afebrile.  Labs significant for leukocytosis of 13,800.  Elevated carboxyhemoglobin of 4.7.  CMP unremarkable, UDS unremarkable, troponin normal at 12.  Urinalysis with few ketones but no pyuria. EKG as reviewed by me normal sinus rhythm at 84 with left anterior fascicular block occasional PVC Imaging: Chest x-ray no acute disease Head CT: No acute intracranial findings but numerous sclerotic foci throughout the calvarium and skull base concerning for osseous metastasis in the setting of prostate carcinoma  Patient was placed on nonrebreather due to elevated  carboxyhemoglobin level.  MRI was ordered but patient was too restless and could not sit still and was administered Ativan.  Hospitalist consulted for admission, pending MRI.  Review of Systems: As per HPI otherwise all other systems on review of systems negative.    Past Medical History:  Diagnosis Date  . Arthritis   . BPH (benign prostatic hyperplasia)   . BPH with obstruction/lower urinary tract symptoms   . Diabetes ([place])   . Disorder resulting from impaired renal function   . Elevated PSA   . Family history of lung cancer   . Family history of lymphoma   . Heart murmur   . Hypertension   . Knee pain   . Lesion of lung   . Low HDL (under 40)   . Obesity   . Urinary hesitancy     Past Surgical History:  Procedure Laterality Date  . HERNIA REPAIR  6629   Umbilical Hernia Repair  . KNEE ARTHROSCOPY Left 04/10/2015   Procedure: ARTHROSCOPY KNEE, partial medial meniscectomy, partial synovectomy;  Surgeon: Hessie Knows, MD;  Location: [facility] ORS;  Service: Orthopedics;  Laterality: Left;     reports that he has been smoking cigarettes. He has been smoking about 1.00 pack per day. He has never used smokeless tobacco. He reports that he does not drink alcohol and does not use drugs.  No Known Allergies  Family History  Problem Relation Age of Onset  . Diabetes Mother   . Lung cancer Mother   . Lymphoma Son 28       died  in 1994.   . Kidney disease Neg Hx   . Prostate cancer Neg Hx       Prior to Admission medications   Medication Sig Start Date End Date Taking? Authorizing Provider  abiraterone acetate (ZYTIGA) 250 MG tablet Take 4 tablets (1,000 mg total) by mouth daily. Take on an empty stomach 1 hour before or 2 hours after a meal 02/20/20   Cammie Sickle, MD  acetaminophen (TYLENOL) 650 MG CR tablet Take 650 mg by mouth every 8 (eight) hours as needed for pain.    [provider]  albuterol (PROVENTIL HFA;VENTOLIN HFA) 108 (90 BASE) MCG/ACT inhaler  Inhale 2 puffs into the lungs every 6 (six) hours as needed for wheezing or shortness of breath.    [provider]  aspirin 81 MG tablet Take 81 mg by mouth daily.    [provider]  finasteride (PROSCAR) 5 MG tablet TAKE 1 TABLET BY MOUTH EVERY DAY 11/09/17   McGowan, Larene Beach A, PA-C  glipiZIDE (GLUCOTROL XL) 10 MG 24 hr tablet 1 BY MOUTH DAILY FOR DIABETES 11/09/14   [provider]  lisinopril-hydrochlorothiazide (ZESTORETIC) 10-12.5 MG tablet Take 1 tablet by mouth daily. 04/24/20   [provider]  metFORMIN (GLUCOPHAGE) 1000 MG tablet Take 1,000 mg by mouth 2 (two) times daily with a meal.    [provider]  potassium chloride SA (KLOR-CON) 20 MEQ tablet 1 pill twice a day 05/14/20   Cammie Sickle, MD  predniSONE (DELTASONE) 5 MG tablet Take 1 tablet (5 mg total) by mouth daily with breakfast. 03/12/20   Cammie Sickle, MD  simvastatin (ZOCOR) 40 MG tablet Take 40 mg by mouth daily.    [provider]  TRADJENTA 5 MG TABS tablet Take 5 mg by mouth daily. 03/20/20   [provider]  traMADol (ULTRAM) 50 MG tablet TAKE 1 TABLET (50 MG TOTAL) BY MOUTH EVERY 6 (SIX) HOURS AS NEEDED FOR MODERATE PAIN OR SEVERE PAIN 05/06/20   Verlon Au, NP    Physical Exam: Vitals:   05/27/20 0150 05/27/20 0155 05/27/20 0200 05/27/20 0404  BP: (!) 211/105 (!) 201/101 (!) 191/95 (!) 183/93  Pulse: 87 85 85 97  Resp: 16 13 (!) 21 16  Temp:    99.2 F (37.3 C)  TempSrc:    Rectal  SpO2: 97% 98% 100% 100%  Weight:      Height:         Vitals:   05/27/20 0150 05/27/20 0155 05/27/20 0200 05/27/20 0404  BP: (!) 211/105 (!) 201/101 (!) 191/95 (!) 183/93  Pulse: 87 85 85 97  Resp: 16 13 (!) 21 16  Temp:    99.2 F (37.3 C)  TempSrc:    Rectal  SpO2: 97% 98% 100% 100%  Weight:      Height:          Constitutional:  Sedated from Ativan administered in the emergency room but appears restless. Not in any apparent  distress HEENT:      Head: Normocephalic and atraumatic.         Eyes: PERLA, EOMI, Conjunctivae are normal. Sclera is non-icteric.       Mouth/Throat: Mucous membranes are moist.       Neck: Supple with no signs of meningismus. Cardiovascular: Regular rate and rhythm. No murmurs, gallops, or rubs. 2+ symmetrical distal pulses are present . No JVD. No LE edema Respiratory: Respiratory effort normal .Lungs sounds clear bilaterally. No wheezes, crackles, or  rhonchi.  Gastrointestinal: Soft, non tender, and non distended with positive bowel sounds.  Genitourinary: No CVA tenderness. Musculoskeletal: Nontender with normal range of motion in all extremities. No cyanosis, or erythema of extremities. Neurologic:  Face is symmetric. Moving all extremities.  Unable to assess due to sedation from Ativan skin: Skin is warm, dry.  No rash or ulcers Psychiatric: Unable to assess due to sedation from Ativan   Labs on Admission: I have personally reviewed following labs and imaging studies  CBC: Recent Labs  Lab 05/27/20 0119  WBC 13.8*  NEUTROABS 9.9*  HGB 12.5*  HCT 41.0  MCV 94.9  PLT 662   Basic Metabolic Panel: Recent Labs  Lab 05/27/20 0119  NA 138  K 3.6  CL 104  CO2 23  GLUCOSE 184*  BUN 13  CREATININE 1.09  CALCIUM 9.8   GFR: Estimated Creatinine Clearance: 64.6 mL/min (by C-G formula based on SCr of 1.09 mg/dL). Liver Function Tests: Recent Labs  Lab 05/27/20 0119  AST 14*  ALT 10  ALKPHOS 122  BILITOT 0.6  PROT 7.5  ALBUMIN 4.0   No results for input(s): LIPASE, AMYLASE in the last 168 hours. No results for input(s): AMMONIA in the last 168 hours. Coagulation Profile: No results for input(s): INR, PROTIME in the last 168 hours. Cardiac Enzymes: No results for input(s): CKTOTAL, CKMB, CKMBINDEX, TROPONINI in the last 168 hours. BNP (last 3 results) No results for input(s): PROBNP in the last 8760 hours. HbA1C: No results for input(s): HGBA1C in the last 72  hours. CBG: No results for input(s): GLUCAP in the last 168 hours. Lipid Profile: No results for input(s): CHOL, HDL, LDLCALC, TRIG, CHOLHDL, LDLDIRECT in the last 72 hours. Thyroid Function Tests: No results for input(s): TSH, T4TOTAL, FREET4, T3FREE, THYROIDAB in the last 72 hours. Anemia Panel: No results for input(s): VITAMINB12, FOLATE, FERRITIN, TIBC, IRON, RETICCTPCT in the last 72 hours. Urine analysis:    Component Value Date/Time   COLORURINE YELLOW (A) 05/27/2020 0219   APPEARANCEUR CLEAR (A) 05/27/2020 0219   LABSPEC 1.013 05/27/2020 0219   PHURINE 6.0 05/27/2020 0219   GLUCOSEU 50 (A) 05/27/2020 0219   HGBUR SMALL (A) 05/27/2020 0219   BILIRUBINUR NEGATIVE 05/27/2020 0219   KETONESUR 5 (A) 05/27/2020 0219   PROTEINUR NEGATIVE 05/27/2020 0219   NITRITE NEGATIVE 05/27/2020 0219   LEUKOCYTESUR NEGATIVE 05/27/2020 0219    Radiological Exams on Admission: DG Chest 2 View  Result Date: 05/27/2020 CLINICAL DATA:  80 year old male with altered mental status. EXAM: CHEST - 2 VIEW COMPARISON:  Chest radiograph dated 01/16/2020. FINDINGS: Left lung base linear atelectasis/scarring. No focal consolidation, pleural effusion, pneumothorax. Mild cardiomegaly. Atherosclerotic calcification of the aortic arch. No acute osseous pathology. IMPRESSION: No active cardiopulmonary disease. Electronically Signed   By: Anner Crete M.D.   On: 05/27/2020 02:46   CT HEAD WO CONTRAST  Result Date: 05/27/2020 CLINICAL DATA:  Right-sided headache with left-sided defects EXAM: CT HEAD WITHOUT CONTRAST TECHNIQUE: Contiguous axial images were obtained from the base of the skull through the vertex without intravenous contrast. COMPARISON:  PET-CT 01/24/2020 FINDINGS: Brain: No evidence of acute infarction, hemorrhage, hydrocephalus, extra-axial collection, visible mass lesion or mass effect. Diffuse prominence of the ventricles, cisterns and sulci compatible with moderate parenchymal volume loss.  Incidental note made of a cavum septum pellucidum et vergae. Patchy areas of white matter hypoattenuation are most compatible with chronic microvascular angiopathy. Benign dural calcifications. Vascular: Atherosclerotic calcification of the carotid siphons and intradural vertebral arteries. No  hyperdense vessel. Skull: No calvarial fracture. No scalp swelling or hematoma. Few sclerotic foci seen in the colitis and left occipital condyle as well as the anterior right parietal bone and occipital bone likely reflecting multifocal osseous metastatic disease in the setting of prostate carcinoma. Sinuses/Orbits: Paranasal sinuses and mastoid air cells are predominantly clear. Orbital structures are unremarkable aside from prior lens extractions. Other: None. IMPRESSION: 1. No acute intracranial findings. 2. Moderate parenchymal volume loss and chronic microvascular angiopathy. 3. Numerous sclerotic foci throughout the calvarium and skull base concerning for osseous metastases in the setting of prostate carcinoma. Electronically Signed   By: Lovena Le M.D.   On: 05/27/2020 01:47     Assessment/Plan 80 year old male with history of prostate cancer metastatic to bone on Zytiga, diabetes, hypertension, presenting with headache, confusion x2 hours and a 1 week history of left-sided weakness    Acute neurologic deficit, suspect acute CVA -Patient with confusion, impaired ability to comprehend, left-sided upper extremity weakness last known normal at 5 PM on 3/21 greater than 4 hours an outside TPA window -CT head with no acute finding -Follow-up MRI to evaluate for CVA, PRES, brain mets -Low suspicion for acute metabolic encephalopathy related to mildly elevated carboxyhemoglobin -No evidence of acute infection -Continuous cardiac monitoring, echocardiogram and carotid Doppler -Aspirin and statin -Permissive hypertension -Neurology consult -PT OT and TOC consults -Palliative care consult considered  appropriate  Hypertensive urgency -BP 211/105 on arrival -Given stroke suspicion, allow permissive hypertension up to SBP 200    Carboxyhemoglobinemia -Carboxyhemoglobin slightly elevated to 4.7 -Continue oxygen    Prostate cancer metastatic to multiple sites ([place]) -Continue Zytiga and prednisone pending med rec    Diabetes mellitus without complication (HCC) -Sliding scale insulin coverage    DVT prophylaxis: Lovenox  Code Status: full code  Family Communication: Daughter at bedside Disposition Plan: Back to previous home environment Consults called: Neurology Status:At the time of admission, it appears that the appropriate admission status for this patient is INPATIENT. This is judged to be reasonable and necessary in order to provide the required intensity of service to ensure the patient's safety given the presenting symptoms, physical exam findings, and initial radiographic and laboratory data in the context of their  Comorbid conditions.   Patient requires inpatient status due to high intensity of service, high risk for further deterioration and high frequency of surveillance required.   I certify that at the point of admission it is my clinical judgment that the patient will require inpatient hospital care spanning beyond [facility]     05/27/2020, 4:32 AM

## 2020-05-27 NOTE — Progress Notes (Signed)
OT Cancellation Note  Patient Details Name: Brent Glass MRN: [id number] DOB: August 10, 1940   Cancelled Treatment:    Reason Eval/Treat Not Completed: Fatigue/lethargy limiting ability to participate. Consult received, chart reviewed. Upon attempt for PT/OT co-evaluation, pt only able to be briefly woken with moderate to maximum verbal/tactile stimuli including sternal rubs, before pt falling back asleep again (unable to keep pt awake long enough to safely initiate a meaningful therapy evaluation).  Will re-attempt OT evaluation at a later date/time.  Nurse notified of attempted session and pt's status.  Hanley Hays, MPH, MS, OTR/L ascom [phone] 05/27/20, 2:21 PM

## 2020-05-27 NOTE — ED Notes (Signed)
ECHO at bedside.

## 2020-05-27 NOTE — ED Notes (Signed)
Dr. Leonides Schanz notified patient restless, continues to try to get out of bed. Unable to redirect patient. Confused. Daughter at the bedside. Awaiting orders.

## 2020-05-27 NOTE — ED Notes (Signed)
MRI staff call to ask about trying MRI again; pt has been sleeping peacefully so it would be a good time to attempt again. MRI staff aware.

## 2020-05-27 NOTE — ED Notes (Addendum)
Daughter at bedside. Pt resting peacefully, arousable to voice and touch; otherwise resting with eyes closed. Daughter reports that pt was unable to complete MRI last night due to restlessness despite PRN mediations given. Reports pt has been incontinent of urine, which is not at baseline for patient. Reports no baseline swallowing impairments. Will attempt  SSScreen and current NIHHS when patient awakens. No further needs at this time. Pt clean and dry, all monitoring cords on and functioning.

## 2020-05-27 NOTE — Progress Notes (Addendum)
No charge progress note.  Brent Glass is a 80 y.o. 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  Daughter also noticed that patient is somewhat confused. Mildly elevated carboxyhemoglobin, but patient is a chronic smoker. CT head and MRI brain was without any acute abnormality but did show multiple osseous mets involving the skull. He was admitted to complete CVA work-up.  Echocardiogram with normal EF and grade 1 diastolic dysfunction.  Neurology was consulted and they do not think any further work-up needed for stroke as MRI was negative.  Patient received 2 mg of Ativan this morning and was deep asleep and not following any commands when seen today. Daughter at bedside. Rest of the exam was within normal limit.  Patient with markedly elevated blood pressure on admission which has been normalized now.  Per daughter he was recently taken off from his antihypertensives due to hypotension.  I will involve palliative care. We will reevaluate once little more clear from the effect of Ativan.

## 2020-05-27 NOTE — Consult Note (Addendum)
Neurology Consultation Reason for Consult: Leg weakness, encephalopathy  Requesting Physician: Judd Gaudier  CC: Confusion and headache  History is obtained from: Patient, chart review and daughters Mechell and Margarito Courser   HPI: Brent Glass is a 80 y.o. male with a past medical history significant for Stage IV metastatic prostate cancer (lungs, bones), hypertension, hyperlipidemia, diabetes, obesity, smoking (1 ppd)  Regarding his malignancy history, he had presented to the emergency department on November 10 with intermittent chest pain.  He follows with Dr. Rogue Bussing.  He is on the Zytiga and prednisone but these were held on 3/9 secondary to severe hypokalemia.  K-Dur 20 mg twice daily was initiated with counseling to consume potassium rich foods, and lisinopril-HCTZ combo pill was also discontinued.  Daughter brought the patient into the ED for evaluation of confusion and right-sided headache which started at 10 PM and was unrelieved by tramadol.  There is also concern for kerosene smell in the house.  Additionally patient has been driving left leg for 1 month.  ED course was notable for vitals with systolic blood pressures in the 200s over 100s on arrival, heart rates in the 80s, mid 90s O2 sat on room air.  Head CT (personally reviewed) was negative for acute intracranial process (multiple osseous metastases).  MRI was attempted but patient would not follow commands for the examination.  Due to elevated carboxyhemoglobin level (4.7) patient was placed on nonrebreather.  He was given 4 mg of Zofran for nausea at 2 AM, and 1 mg of Ativan x2 doses (3:30 and 5:17 AM) to facilitate MRI scan  LKW: 5 PM on 3/21 tPA given?: No, out of the window  ROS: Unable to obtain directly due to altered mental status, though daughter reported no concerns other than what is described above.  In particular she reported that he does not typically have any headaches, had nausea and vomiting only on arrival to the  ED, blood pressures at home typically run in the 110s but were in the 190s just prior to presentation to the ED, and that he had taken half a tramadol without relief of his headache at home.  Specifically she denied the patient telling her about any vision changes, hearing changes, speech or swallow issues, any focal weakness other than his chronic left leg intermittent weakness which has been stable since it began in November (which has resulted in some falls).  At baseline, the patient is still driving for the [facility] and is ambulatory and independent.  Past Medical History:  Diagnosis Date  . Arthritis   . BPH (benign prostatic hyperplasia)   . BPH with obstruction/lower urinary tract symptoms   . Diabetes ([place])   . Disorder resulting from impaired renal function   . Elevated PSA   . Family history of lung cancer   . Family history of lymphoma   . Heart murmur   . Hypertension   . Knee pain   . Lesion of lung   . Low HDL (under 40)   . Obesity   . Urinary hesitancy    Past Surgical History:  Procedure Laterality Date  . HERNIA REPAIR  1660   Umbilical Hernia Repair  . KNEE ARTHROSCOPY Left 04/10/2015   Procedure: ARTHROSCOPY KNEE, partial medial meniscectomy, partial synovectomy;  Surgeon: Hessie Knows, MD;  Location: [facility] ORS;  Service: Orthopedics;  Laterality: Left;   Current Outpatient Medications  Medication Instructions  . abiraterone acetate (ZYTIGA) 1,000 mg, Oral, Daily, Take on an empty stomach 1 hour before  or 2 hours after a meal  . acetaminophen (TYLENOL) 650 mg, Oral, Every 8 hours PRN  . albuterol (PROVENTIL HFA;VENTOLIN HFA) 108 (90 BASE) MCG/ACT inhaler 2 puffs, Inhalation, Every 6 hours PRN  . aspirin 81 mg, Oral, Daily  . finasteride (PROSCAR) 5 MG tablet TAKE 1 TABLET BY MOUTH EVERY DAY  . glipiZIDE (GLUCOTROL XL) 10 mg, Oral, Daily with breakfast  . lisinopril-hydrochlorothiazide (ZESTORETIC) 10-12.5 MG tablet 1 tablet, Daily  . metFORMIN (GLUCOPHAGE) 1,000  mg, Oral, 2 times daily with meals  . potassium chloride SA (KLOR-CON) 20 MEQ tablet 1 pill twice a day  . predniSONE (DELTASONE) 5 mg, Oral, Daily with breakfast  . simvastatin (ZOCOR) 40 mg, Oral, Daily  . Tradjenta 5 mg, Oral, Daily  . traMADol (ULTRAM) 50 mg, Oral, Every 6 hours PRN    Family History  Problem Relation Age of Onset  . Diabetes Mother   . Lung cancer Mother   . Lymphoma Son 28       died in 13.   . Kidney disease Neg Hx   . Prostate cancer Neg Hx    Social History:  reports that he has been smoking cigarettes. He has been smoking about 1.00 pack per day. He has never used smokeless tobacco. He reports that he does not drink alcohol and does not use drugs.   Exam: Current vital signs: BP (!) 122/55   Pulse 83   Temp 98.2 F (36.8 C) (Oral)   Resp (!) 29   Ht 6' (1.829 m)   Wt 94.8 kg   SpO2 92%   BMI 28.35 kg/m  Vital signs in last 24 hours: Temp:  [98.2 F (36.8 C)-99.2 F (37.3 C)] 98.2 F (36.8 C) (03/22 1034) Pulse Rate:  [82-105] 83 (03/22 1000) Resp:  [13-29] 29 (03/22 1000) BP: (122-211)/(55-173) 122/55 (03/22 1000) SpO2:  [92 %-100 %] 92 % (03/22 1000) Weight:  [94.8 kg] 94.8 kg (03/22 0040)   Physical Exam  Constitutional: Appears well-developed and well-nourished.  Psych: Affect appropriate to situation, minimally interactive after 2 mg of Ativan Eyes: No scleral injection HENT: No oropharyngeal obstruction.  MSK: no joint deformities.  Cardiovascular: Normal rate and regular rhythm.  Respiratory: Effort normal, non-labored breathing GI: Soft.  No distension. There is no tenderness.  Skin: Warm dry and intact visible skin   Neuro: Mental Status: Patient is somnolent but will awaken briefly with repeated noxious stimulation and follow some simple commands.  Speech is significantly slurred.  No clear neglect. Cranial Nerves: II: Pupils are equal, round, and reactive to light.  3 to 1 mm III,IV, VI: EOMI without ptosis or  diploplia, though there is some direction changing nystagmus that is gaze evoked.  V: Facial sensation is symmetric to light eyelash brush VII: Facial movement is symmetric.  VIII: hearing is intact to voice X: Uvula unable to assess secondary to mental status XI: Shoulder shrug unable to assess secondary to somnolence XII: tongue is midline without atrophy or fasciculations.  Sensory/motor: Tone is normal. Bulk is normal.  Unable to perform confrontational testing secondary to somnolence, but patient moves all 4 extremities roughly equally to light noxious stimulation. Deep Tendon Reflexes: 2+ and symmetric in the biceps, brachioradialis, patellae, Achilles Plantars: Toes are downgoing bilaterally.  Cerebellar: FNF and HKS unable to assess secondary to mental status   I have reviewed labs in epic and the results pertinent to this consultation are:  Leukocytosis to 13.8, no thrombocytosis, mild anemia (normocytic) at 12.5 hemoglobin, BMP  notable for mildly elevated glucose at 184, normal natremia and normokalemia, creatinine 1.09 with a GFR of greater than 60 Troponin of 12 Lab Results  Component Value Date   CHOL 215 (H) 05/27/2020   HDL 60 05/27/2020   LDLCALC 127 (H) 05/27/2020   TRIG 138 05/27/2020   CHOLHDL 3.6 05/27/2020   UA negative for infection UDS negative, blood alcohol level undetectable Chest x-ray negative for acute process Blood cultures negative to date  No results found for: VITAMINB12   No results found for: TSH   I have reviewed the images obtained: Head CT personally reviewed, no acute intracranial process MRI brain personally reviewed, while there is significant motion degradation in some sequences, there is no acute intracranial process within these limits.  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  Exam is extremely  limited at this time secondary to Ativan, which I believe explains his direction changing nystagmus as well.  Given the reassuring MRI brain, no further neurological imaging needed at this time.  I will round out metabolic evaluation with thiamine, TSH, and B12 levels.  While awaiting thiamine levels and improvement in mental status we will also give a single dose of high-dose IV thiamine.  MRI brain not consistent with toxic insult from carbon monoxide poisoning, and suspect that the carboxyhemoglobin elevation seen may be in the setting of the patient's smoking history.  MRI brain is also negative for stroke and I suspect that the patient's left leg intermittent weakness is secondary to his malignancy and not a new process that requires further work-up at this time based on the history provided by his daughters.  Recommendations: -Thiamine, B12, TSH -Thiamine 500 mg IV x 1 dose -Expect patient's mental status to normalize given his blood pressure is now better controlled, as Ativan is metabolized -Neurology will continue to follow  Roundup [phone] [facility] coverage for [facility] is from 8 AM to 4 AM in-house and 4 PM to 8 PM by telephone/video. 8 PM to 8 AM emergent questions or overnight urgent questions should be addressed to Teleneurology On-call or [facility] neurohospitalist; contact information can be found on AMION  In the afternoon patient's mental status was indeed slowly improving. Discussed with family at bedside that we will continue to observe and expect improvement. Should he become febrile, or decompensate, would perform LP. Will trend CBC and BMP

## 2020-05-27 NOTE — ED Notes (Signed)
Patient taken to CT.

## 2020-05-27 NOTE — ED Notes (Signed)
Pt arousable to voice; opened right eye and said hello back to RN when prompted. Pt unable to follow any other commands at this time. Pt clean and dry.

## 2020-05-27 NOTE — ED Notes (Signed)
Pt now to MRI.

## 2020-05-27 NOTE — Progress Notes (Signed)
PT Cancellation Note  Patient Details Name: Brent Glass MRN: [id number] DOB: Jan 16, 1941   Cancelled Treatment:    Reason Eval/Treat Not Completed: Fatigue/lethargy limiting ability to participate.  PT consult received.  Chart reviewed.  Attempted PT/OT co-evaluation but pt only able to be briefly woken before pt falling back asleep again (unable to keep pt awake long enough to safely initiate a meaningful therapy evaluation).  Will re-attempt PT evaluation at a later date/time.  Nurse notified of attempted session and pt's status.  Leitha Bleak, PT 05/27/20, 2:19 PM

## 2020-05-27 NOTE — ED Triage Notes (Addendum)
Pt to triage via w/c, brought in by daughter; pt reports sudden onset rt sided HA 2hrs PTA with no accomp symptoms; st pain unrelieved by tramadol; denies hx of same; daughter reports pt with prostate CA and mets to bones, noted recent strong oder of kerosene in house and has been confused and staggering for the last wk; daughter also reports pt's BP meds were recently stopped due to low K+ levels; pt is restless and difficulty holding still for BP; pt A&Ox3, MAEW

## 2020-05-27 NOTE — Progress Notes (Signed)
*  PRELIMINARY RESULTS* Echocardiogram 2D Echocardiogram has been performed.  Sherrie Sport 05/27/2020, 8:48 AM

## 2020-05-27 NOTE — ED Notes (Signed)
MRI to be performed approx 1400 today.

## 2020-05-27 NOTE — ED Notes (Signed)
Pt cleaned up after episode of urinary incontinence. Pt able to roll somewhat and follow some commands at this time. Tolerated well.

## 2020-05-28 ENCOUNTER — Inpatient Hospital Stay: Payer: Medicare Other | Admitting: Internal Medicine

## 2020-05-28 ENCOUNTER — Inpatient Hospital Stay: Payer: Medicare Other

## 2020-05-28 DIAGNOSIS — I639 Cerebral infarction, unspecified: Secondary | ICD-10-CM

## 2020-05-28 DIAGNOSIS — Z515 Encounter for palliative care: Secondary | ICD-10-CM

## 2020-05-28 LAB — CBC WITH DIFFERENTIAL/PLATELET
Abs Immature Granulocytes: 0.04 10*3/uL (ref 0.00–0.07)
Basophils Absolute: 0.1 10*3/uL (ref 0.0–0.1)
Basophils Relative: 1 %
Eosinophils Absolute: 0.1 10*3/uL (ref 0.0–0.5)
Eosinophils Relative: 1 %
HCT: 35.7 % — ABNORMAL LOW (ref 39.0–52.0)
Hemoglobin: 11.8 g/dL — ABNORMAL LOW (ref 13.0–17.0)
Immature Granulocytes: 0 %
Lymphocytes Relative: 32 %
Lymphs Abs: 3.9 10*3/uL (ref 0.7–4.0)
MCH: 29.4 pg (ref 26.0–34.0)
MCHC: 33.1 g/dL (ref 30.0–36.0)
MCV: 89 fL (ref 80.0–100.0)
Monocytes Absolute: 1.1 10*3/uL — ABNORMAL HIGH (ref 0.1–1.0)
Monocytes Relative: 9 %
Neutro Abs: 6.9 10*3/uL (ref 1.7–7.7)
Neutrophils Relative %: 57 %
Platelets: 298 10*3/uL (ref 150–400)
RBC: 4.01 MIL/uL — ABNORMAL LOW (ref 4.22–5.81)
RDW: 14.1 % (ref 11.5–15.5)
WBC: 12 10*3/uL — ABNORMAL HIGH (ref 4.0–10.5)
nRBC: 0 % (ref 0.0–0.2)

## 2020-05-28 LAB — BASIC METABOLIC PANEL
Anion gap: 10 (ref 5–15)
BUN: 12 mg/dL (ref 8–23)
CO2: 24 mmol/L (ref 22–32)
Calcium: 9.5 mg/dL (ref 8.9–10.3)
Chloride: 106 mmol/L (ref 98–111)
Creatinine, Ser: 1.03 mg/dL (ref 0.61–1.24)
GFR, Estimated: >60 mL/min (ref >60)
Glucose, Bld: 70 mg/dL (ref 70–99)
Potassium: 3.4 mmol/L — ABNORMAL LOW (ref 3.5–5.1)
Sodium: 140 mmol/L (ref 135–145)

## 2020-05-28 LAB — GLUCOSE, CAPILLARY
Glucose-Capillary: 87 mg/dL (ref 70–99)
Glucose-Capillary: 174 mg/dL — ABNORMAL HIGH (ref 70–99)
Glucose-Capillary: 126 mg/dL — ABNORMAL HIGH (ref 70–99)
Glucose-Capillary: 185 mg/dL — ABNORMAL HIGH (ref 70–99)

## 2020-05-28 LAB — MAGNESIUM: Magnesium: 1.7 mg/dL (ref 1.7–2.4)

## 2020-05-28 MED ORDER — CYANOCOBALAMIN 1000 MCG/ML IJ SOLN
1000.0000 ug | Freq: Every day | INTRAMUSCULAR | Status: DC
  Administered 2020-05-28 – 2020-05-29 (×2): 1000 ug via INTRAMUSCULAR
  Filled 2020-05-28 (×3): qty 1

## 2020-05-28 MED ORDER — LOSARTAN POTASSIUM 25 MG PO TABS
25.0000 mg | ORAL_TABLET | Freq: Every day | ORAL | Status: DC
  Administered 2020-05-28 – 2020-05-29 (×2): 25 mg via ORAL
  Filled 2020-05-28 (×2): qty 1

## 2020-05-28 MED ORDER — POTASSIUM CHLORIDE CRYS ER 20 MEQ PO TBCR
40.0000 meq | EXTENDED_RELEASE_TABLET | Freq: Once | ORAL | Status: AC
  Administered 2020-05-28: 40 meq via ORAL
  Filled 2020-05-28: qty 2

## 2020-05-28 MED ORDER — CEFAZOLIN SODIUM-DEXTROSE 2-4 GM/100ML-% IV SOLN
2.0000 g | Freq: Three times a day (TID) | INTRAVENOUS | Status: DC
  Administered 2020-05-28: 2 g via INTRAVENOUS
  Filled 2020-05-28 (×3): qty 100

## 2020-05-28 NOTE — Progress Notes (Signed)
PHARMACY - PHYSICIAN COMMUNICATION CRITICAL VALUE ALERT - BLOOD CULTURE IDENTIFICATION (BCID)  BCID results: 2 of 4 bottles (1 anaerobic, 1 aerobic) with Staph Epi, no resistance. Pt is not currently on any abx.  Name of physician (or Provider) ContactedPrudy Feeler  Changes to prescribed antibiotics required: Cefazolin 2 gm q8h.  Renda Rolls, PharmD, [facility] 05/28/2020 12:26 AM

## 2020-05-28 NOTE — Consult Note (Signed)
[place]  [phone] [phone]   Name: Brent Glass Date: 05/28/2020 MRN: [id number]  DOB: 11-Jul-1940  Patient Care Team: Donnie Coffin, [name], RN as Oncology Nurse Navigator    REASON FOR CONSULTATION: Brent Glass is a 80 y.o. 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  Patient was admitted to the hospital on 05/27/2020 with altered mental status of unclear etiology.  Work-up including CT and MRI of the brain were unrevealing.  Palliative care was consulted help address goals.  SOCIAL HISTORY:     reports that he has been smoking cigarettes. He has been smoking about 1.00 pack per day. He has never used smokeless tobacco. He reports that he does not drink alcohol and does not use drugs.  Patient is a widower.  He lives at home alone.  Patient still works as a Recruitment consultant for [employer].  Historically, he worked as a Recruitment consultant for the [employer] and then for [employer].  He has 2 daughters and 2 sons who live locally.  ADVANCE DIRECTIVES:  On file  CODE STATUS: Full code  PAST MEDICAL HISTORY: Past Medical History:  Diagnosis Date  . Arthritis   . BPH (benign prostatic hyperplasia)   . BPH with obstruction/lower urinary tract symptoms   . Diabetes ([place])   . Disorder resulting from impaired renal function   . Elevated PSA   . Family history of lung cancer   . Family history of lymphoma   . Heart murmur   . Hypertension   . Knee pain   . Lesion of lung   . Low HDL (under 40)   . Obesity   . Urinary hesitancy     PAST SURGICAL HISTORY:  Past Surgical History:  Procedure Laterality Date  . HERNIA REPAIR  7026   Umbilical Hernia Repair  . KNEE ARTHROSCOPY Left 04/10/2015   Procedure: ARTHROSCOPY KNEE, partial medial meniscectomy, partial synovectomy;  Surgeon: Hessie Knows, MD;   Location: [facility] ORS;  Service: Orthopedics;  Laterality: Left;    HEMATOLOGY/ONCOLOGY HISTORY:  Oncology History Overview Note  # NOV 2021- prostate cancer metastatic to lung/bone [NOV PSA 900+; no biopsy].   # NOV 2021- CTA [ER] Diffuse bilateral pulmonary nodules including a cavitary lesion in the right upper lobe measuring approximately 2 cm. There is extensive nodular pleural thickening bilaterally, greatest in the left lower lobe.  November 2021 PET scan-multiple lung lesions multiple bone lesions and prostate uptake.    # DM-2; NO COPD [?]; ACTIVE SMOKER 65ppd.  # DEC 2nd week- FIRMAGON; # JAN 12TH, 2022-ZYTIGA  1000 MG+ PRED 5MG; FEB MID 2022- ELIGARD q6M  # NGS/MOLECULAR TESTS: MARCH 2022-foundation 1 no targetable mutation./Genetic evaluation negative    # PALLIATIVE CARE EVALUATION:  # PAIN MANAGEMENT:    DIAGNOSIS: Prostate cancer  STAGE:   IV      ;  GOALS: Palliative  CURRENT/MOST RECENT THERAPY : ADT+ ZYTIGA    Prostate cancer metastatic to multiple sites [facility])  02/04/2020 Initial Diagnosis   Prostate cancer metastatic to multiple sites [facility])   03/19/2020 Cancer Staging   Staging form: Prostate, AJCC 8th Edition - Clinical: Stage IVB (pM1b) - Signed by Cammie Sickle, MD on 03/19/2020    Genetic Testing   Negative genetic testing. No pathogenic variants identified on the [facility] Multi-Cancer Panel. VUS in AXIN2 called c.1235A>C identified.  The report date is 03/23/2020.   The Multi-Cancer Panel offered by [facility] includes sequencing and/or deletion duplication testing of the following 84 genes: AIP, ALK, APC, ATM, AXIN2,BAP1,  BARD1, BLM, BMPR1A, BRCA1, BRCA2, BRIP1, CASR, CDC73, CDH1, CDK4, CDKN1B, CDKN1C, CDKN2A (p14ARF), CDKN2A (p16INK4a), CEBPA, CHEK2, CTNNA1, DICER1, DIS3L2, EGFR (c.2369C>T, p.Thr790Met variant only), EPCAM (Deletion/duplication testing only), FH, FLCN, GATA2, GPC3, GREM1 (Promoter region deletion/duplication testing only), HOXB13  (c.251G>A, p.Gly84Glu), HRAS, KIT, MAX, MEN1, MET, MITF (c.952G>A, p.Glu318Lys variant only), MLH1, MSH2, MSH3, MSH6, MUTYH, NBN, NF1, NF2, NTHL1, PALB2, PDGFRA, PHOX2B, PMS2, POLD1, POLE, POT1, PRKAR1A, PTCH1, PTEN, RAD50, RAD51C, RAD51D, RB1, RECQL4, RET, RUNX1, SDHAF2, SDHA (sequence changes only), SDHB, SDHC, SDHD, SMAD4, SMARCA4, SMARCB1, SMARCE1, STK11, SUFU, TERC, TERT, TMEM127, TP53, TSC1, TSC2, VHL, WRN and WT1.      ALLERGIES:  has No Known Allergies.  MEDICATIONS:  Current Facility-Administered Medications  Medication Dose Route Frequency Provider Last Rate Last Admin  .  stroke: mapping our early stages of recovery book   Does not apply Once Judd Gaudier V, MD      . 0.9 %  sodium chloride infusion   Intravenous Continuous Athena Masse, MD 75 mL/hr at 05/27/20 0762 New Bag at 05/27/20 2633  . abiraterone acetate (ZYTIGA) tablet 1,000 mg  1,000 mg Oral Daily Lorella Nimrod, MD      . acetaminophen (TYLENOL) tablet 650 mg  650 mg Oral Q4H PRN Athena Masse, MD   650 mg at 05/28/20 3545   Or  . acetaminophen (TYLENOL) 160 MG/5ML solution 650 mg  650 mg Per Tube Q4H PRN Athena Masse, MD       Or  . acetaminophen (TYLENOL) suppository 650 mg  650 mg Rectal Q4H PRN Athena Masse, MD      . cyanocobalamin ((VITAMIN B-12)) injection 1,000 mcg  1,000 mcg Intramuscular Daily Lorella Nimrod, MD   1,000 mcg at 05/28/20 1158  . finasteride (PROSCAR) tablet 5 mg  5 mg Oral Daily Lorella Nimrod, MD   5 mg at 05/28/20 0950  . heparin injection 5,000 Units  5,000 Units Subcutaneous Q8H Athena Masse, MD   5,000 Units at 05/28/20 0606  . insulin aspart (novoLOG) injection 0-15 Units  0-15 Units Subcutaneous TID WC Athena Masse, MD   3 Units at 05/28/20 1158  . insulin aspart (novoLOG) injection 0-5 Units  0-5 Units Subcutaneous QHS Judd Gaudier V, MD      . linagliptin (TRADJENTA) tablet 5 mg  5 mg Oral Daily Lorella Nimrod, MD   5 mg at 05/28/20 0950  . predniSONE (DELTASONE) tablet 5  mg  5 mg Oral Q breakfast Lorella Nimrod, MD      . senna-docusate (Senokot-S) tablet 1 tablet  1 tablet Oral QHS PRN Athena Masse, MD      . simvastatin (ZOCOR) tablet 40 mg  40 mg Oral q1800 Lorella Nimrod, MD      . traMADol (ULTRAM) tablet 50 mg  50 mg Oral Q6H PRN Lorella Nimrod, MD        VITAL SIGNS: BP 119/73   Pulse 72   Temp 98.1 F (36.7 C) (Oral)   Resp 18   Ht 6' (1.829 m)   Wt 194 lb 3.6 oz (88.1 kg)   SpO2 100%   BMI 26.34 kg/m  Filed Weights   05/27/20 0040 05/27/20 1531 05/28/20 0420  Weight: 209 lb (94.8 kg) 194 lb 7.1 oz (88.2 kg) 194 lb 3.6 oz (88.1 kg)  Estimated body mass index is 26.34 kg/m as calculated from the following:   Height as of this encounter: 6' (1.829 m).   Weight as of this encounter: 194 lb 3.6 oz (88.1 kg).  LABS: CBC:    Component Value Date/Time   WBC 12.0 (H) 05/28/2020 0725   HGB 11.8 (L) 05/28/2020 0725   HCT 35.7 (L) 05/28/2020 0725   PLT 298 05/28/2020 0725   MCV 89.0 05/28/2020 0725   NEUTROABS 6.9 05/28/2020 0725   LYMPHSABS 3.9 05/28/2020 0725   MONOABS 1.1 (H) 05/28/2020 0725   EOSABS 0.1 05/28/2020 0725   BASOSABS 0.1 05/28/2020 0725   Comprehensive Metabolic Panel:    Component Value Date/Time   NA 140 05/28/2020 0553   K 3.4 (L) 05/28/2020 0553   CL 106 05/28/2020 0553   CO2 24 05/28/2020 0553   BUN 12 05/28/2020 0553   CREATININE 1.03 05/28/2020 0553   GLUCOSE 70 05/28/2020 0553   CALCIUM 9.5 05/28/2020 0553   AST 14 (L) 05/27/2020 0119   ALT 10 05/27/2020 0119   ALKPHOS 122 05/27/2020 0119   BILITOT 0.6 05/27/2020 0119   PROT 7.5 05/27/2020 0119   ALBUMIN 4.0 05/27/2020 0119    RADIOGRAPHIC STUDIES: DG Chest 2 View  Result Date: 05/27/2020 CLINICAL DATA:  80 year old male with altered mental status. EXAM: CHEST - 2 VIEW COMPARISON:  Chest radiograph dated 01/16/2020. FINDINGS: Left lung base linear atelectasis/scarring. No focal consolidation, pleural effusion, pneumothorax. Mild cardiomegaly.  Atherosclerotic calcification of the aortic arch. No acute osseous pathology. IMPRESSION: No active cardiopulmonary disease. Electronically Signed   By: Anner Crete M.D.   On: 05/27/2020 02:46   CT HEAD WO CONTRAST  Result Date: 05/27/2020 CLINICAL DATA:  Right-sided headache with left-sided defects EXAM: CT HEAD WITHOUT CONTRAST TECHNIQUE: Contiguous axial images were obtained from the base of the skull through the vertex without intravenous contrast. COMPARISON:  PET-CT 01/24/2020 FINDINGS: Brain: No evidence of acute infarction, hemorrhage, hydrocephalus, extra-axial collection, visible mass lesion or mass effect. Diffuse prominence of the ventricles, cisterns and sulci compatible with moderate parenchymal volume loss. Incidental note made of a cavum septum pellucidum et vergae. Patchy areas of white matter hypoattenuation are most compatible with chronic microvascular angiopathy. Benign dural calcifications. Vascular: Atherosclerotic calcification of the carotid siphons and intradural vertebral arteries. No hyperdense vessel. Skull: No calvarial fracture. No scalp swelling or hematoma. Few sclerotic foci seen in the colitis and left occipital condyle as well as the anterior right parietal bone and occipital bone likely reflecting multifocal osseous metastatic disease in the setting of prostate carcinoma. Sinuses/Orbits: Paranasal sinuses and mastoid air cells are predominantly clear. Orbital structures are unremarkable aside from prior lens extractions. Other: None. IMPRESSION: 1. No acute intracranial findings. 2. Moderate parenchymal volume loss and chronic microvascular angiopathy. 3. Numerous sclerotic foci throughout the calvarium and skull base concerning for osseous metastases in the setting of prostate carcinoma. Electronically Signed   By: Lovena Le M.D.   On: 05/27/2020 01:47   MR Brain W and Wo Contrast  Result Date: 05/27/2020 CLINICAL DATA:  Mental status change. Prostate cancer with  bone metastases. Diabetes and hypertension. EXAM: MRI HEAD WITHOUT AND WITH CONTRAST TECHNIQUE: Multiplanar, multiecho pulse sequences of the brain and surrounding structures were obtained without and with intravenous contrast. CONTRAST:  7.38m GADAVIST GADOBUTROL 1 MMOL/ML IV SOLN COMPARISON:  CT head without contrast 05/27/2020 FINDINGS: Brain: No acute infarct, hemorrhage, or mass lesion is present. Mild atrophy and moderate diffuse white matter disease is present.  Focal cortical T2 hyperintensities in the high parietal lobe and posterior right middle frontal gyrus are compatible with small remote cortical infarcts. Asymmetric cortical thinning, encephalomalacia, and volume loss is compatible with a remote right occipital lobe infarct. Remote left occipital lobe infarcts noted without volume loss. Cavum septum pellucidum incidentally noted. The internal auditory canals are within normal limits. The brainstem and cerebellum are within normal limits. Postcontrast images demonstrate no pathologic enhancement. The coronal postcontrast images are severely degraded by patient motion. Vascular: Flow is present in the major intracranial arteries. Skull and upper cervical spine: Adenoid hypertrophy noted. No discrete lesion present. Sclerotic skull lesions consistent with known metastases. Sinuses/Orbits: Polyp or mucous retention cyst is present the right maxillary sinus. The paranasal sinuses and mastoid air cells are otherwise clear. Bilateral lens replacements are noted. Globes and orbits are otherwise unremarkable. Bilateral exophthalmos noted. No orbital mass present. IMPRESSION: 1. No acute intracranial abnormality. 2. Mild atrophy and moderate diffuse white matter disease likely reflects the sequela of chronic microvascular ischemia. 3. 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 4. Remote occipital lobe infarcts bilaterally. Volume loss is more noted  on the right. 5. Sclerotic areas in the calvarium consistent with metastatic disease. Electronically Signed   By: San Morelle M.D.   On: 05/27/2020 11:39   ECHOCARDIOGRAM COMPLETE  Result Date: 05/27/2020    ECHOCARDIOGRAM REPORT   Patient Name:   Brent Glass Date of Exam: 05/27/2020 Medical Rec #:  [id number]      Height:       72.0 in Accession #:    [id number]     Weight:       209.0 lb Date of Birth:  10/17/1940      BSA:          2.171 m Patient Age:    13 years       BP:           153/77 mmHg Patient Gender: M              HR:           87 bpm. Exam Location:  [facility] Procedure: 2D Echo, Color Doppler, Cardiac Doppler and Strain Analysis Indications:     Stroke I63.9  History:         Patient has no prior history of Echocardiogram examinations.                  Risk Factors:Hypertension and Diabetes.  Sonographer:     Sherrie Sport RDCS (AE) Referring Phys:  [id number] Athena Masse Diagnosing Phys: Bartholome Bill MD  Sonographer Comments: Global longitudinal strain was attempted. IMPRESSIONS  1. Left ventricular ejection fraction, by estimation, is 65 to 70%. The left ventricle has normal function. The left ventricle has no regional wall motion abnormalities. There is mild left ventricular hypertrophy. Left ventricular diastolic parameters are consistent with Grade I diastolic dysfunction (impaired relaxation).  2. Right ventricular systolic function is normal. The right ventricular size is normal.  3. The mitral valve is grossly normal. Trivial mitral valve regurgitation.  4. The aortic valve is tricuspid. Aortic valve regurgitation is not visualized. FINDINGS  Left Ventricle: Left ventricular ejection fraction, by estimation, is 65 to 70%. The left ventricle has normal function. The left ventricle has no regional wall motion abnormalities. The left ventricular internal cavity size was normal in size. There is  mild left ventricular hypertrophy. Left ventricular diastolic parameters are consistent with  Grade I diastolic dysfunction (impaired relaxation). Right Ventricle: The right ventricular size is normal. No increase in right ventricular wall thickness. Right ventricular systolic function is normal. Left Atrium: Left atrial size was normal in size. Right Atrium: Right atrial size was normal in size. Pericardium: There is no evidence of pericardial effusion. Mitral Valve: The mitral valve is grossly normal. Trivial mitral valve regurgitation. Tricuspid Valve: The tricuspid valve is grossly normal. Tricuspid valve regurgitation is mild. Aortic Valve: The aortic valve is tricuspid. Aortic valve regurgitation is not visualized. Aortic valve mean gradient measures 3.0 mmHg. Aortic valve peak gradient measures 4.4 mmHg. Aortic valve area, by VTI measures 3.34 cm. Pulmonic Valve: The pulmonic valve was grossly normal. Pulmonic valve regurgitation is mild. Aorta: The aortic root is normal in size and structure. IAS/Shunts: The interatrial septum was not well visualized.  LEFT VENTRICLE PLAX 2D LVIDd:         4.44 cm  Diastology LVIDs:         2.75 cm  LV e' medial:    4.79 cm/s LV PW:         1.13 cm  LV E/e' medial:  11.4 LV IVS:        1.07 cm  LV e' lateral:   9.36 cm/s LVOT diam:     2.20 cm  LV E/e' lateral: 5.9 LV SV:         70 LV SV Index:   32 LVOT Area:     3.80 cm                          3D Volume EF:                         3D EF:        53 %                         LV EDV:       111 ml                         LV ESV:       52 ml                         LV SV:        59 ml RIGHT VENTRICLE RV Basal diam:  3.72 cm RV S prime:     19.10 cm/s TAPSE (M-mode): 3.8 cm LEFT ATRIUM           Index       RIGHT ATRIUM           Index LA diam:      3.60 cm 1.66 cm/m  RA Area:     23.20 cm LA Vol (A2C): 47.8 ml 22.02 ml/m RA Volume:   77.20 ml  35.56 ml/m LA Vol (A4C): 49.7 ml 22.89 ml/m  AORTIC VALVE                   PULMONIC VALVE AV Area (Vmax):    3.31 cm    PV Vmax:        0.90 m/s AV Area (Vmean):   3.32  cm    PV Peak grad:   3.2 mmHg AV Area (VTI):     3.34 cm    RVOT Peak grad: 6 mmHg AV  Vmax:           105.00 cm/s AV Vmean:          75.000 cm/s AV VTI:            0.208 m AV Peak Grad:      4.4 mmHg AV Mean Grad:      3.0 mmHg LVOT Vmax:         91.40 cm/s LVOT Vmean:        65.500 cm/s LVOT VTI:          0.183 m LVOT/AV VTI ratio: 0.88  AORTA Ao Root diam: 3.35 cm MITRAL VALVE               TRICUSPID VALVE MV Area (PHT): 6.90 cm    TR Peak grad:   9.1 mmHg MV Decel Time: 110 msec    TR Vmax:        151.00 cm/s MV E velocity: 54.80 cm/s MV A velocity: 89.10 cm/s  SHUNTS MV E/A ratio:  0.62        Systemic VTI:  0.18 m                            Systemic Diam: 2.20 cm Bartholome Bill MD Electronically signed by Bartholome Bill MD Signature Date/Time: 05/27/2020/10:38:43 AM    Final     PERFORMANCE STATUS (ECOG) : 1 - Symptomatic but completely ambulatory  Review of Systems Unless otherwise noted, a complete review of systems is negative.  Physical Exam General: NAD Pulmonary: Unlabored Extremities: no edema, no joint deformities Skin: no rashes Neurological: Weakness but otherwise nonfocal  IMPRESSION: I met with patient and daughter.  Patient's mental status is markedly improved this morning.  Daughter feels like patient is essentially back to baseline.  He was laughing and joking with daughter.  Patient denies any symptomatic complaints at present.  At baseline, patient lives at home alone.  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  Daughter is involved in his care including medication management.  Patient has ACP documents on file.  His daughter, Roanna Epley, is his HCPOA.  Patient will benefit from completion of a MOST form.  Plan is for probable outpatient PT. I would be happy to follow patient in the clinic at the [facility].   PLAN: -Continue current scope of treatment -ACP documents on file -Outpatient PT  Case and plan discussed with Dr. Rogue Bussing  Time Total: 30  minutes  Visit consisted of counseling and education dealing with the complex and emotionally intense issues of symptom management and palliative care in the setting of serious and potentially life-threatening illness.Greater than 50%  of this time was spent counseling and coordinating care related to the above assessment and plan.  Signed by: Altha Harm, PhD, NP-C

## 2020-05-28 NOTE — Evaluation (Signed)
Physical Therapy Evaluation Patient Details Name: Brent Glass MRN: [id number] DOB: [birth year] Today's Date: 05/28/2020   History of Present Illness  Pt is an 80 y.o. male presenting to hospital 3/22 with HA and AMS; pt noted to have been dragging L leg with walking last month.  New weakness L arm.  Pt admitted with acute neurologic deficit (suspect acute CVA), hypertensive urgency, and carboxyhemoglobinemia. CT of head and MRI of brain (-) for acute intracranial abnormality.  PMH includes prostate CA with bony metastasis, htn, and DM.  Clinical Impression  Prior to hospital admission, pt was independent with ambulation (although pt noted to have been dragging L leg with walking last month); lives alone in 1 level home with 3 STE (no railing).  Currently pt is modified independent semi-supine to sitting edge of bed; CGA with transfers; and CGA to SBA ambulating 200 feet (IV pole use then no AD use) then 100 feet with SPC.  First ambulation trial pt noted (with increased distance ambulating) pt with decreased L LE foot clearance and heelstrike and pt with difficulty advancing L LE at times (pt able to self correct balance though).  Second ambulation trial utilized SPC (initial vc's for technique) and pt appearing steady and safe with ambulation and able to consistently advance L LE safely (significant improved gait mechanics noted with SPC use).  Pt would benefit from skilled PT to address noted impairments and functional limitations (see below for any additional details).  Upon hospital discharge, pt would benefit from OP PT and initial supervision with mobility for safety (pt's daughter reports she can stay with pt initially to assist as needed and was going to check to see if pt has Wisdom).    Follow Up Recommendations Outpatient PT;Supervision for mobility/OOB    Equipment Recommendations  Cane    Recommendations for Other Services OT consult     Precautions / Restrictions  Precautions Precautions: Fall Precaution Comments: Aspiration Restrictions Weight Bearing Restrictions: No      Mobility  Bed Mobility Overal bed mobility: Modified Independent             General bed mobility comments: Semi-supine to sitting edge of bed with mild increased effort    Transfers Overall transfer level: Needs assistance Equipment used: None Transfers: Sit to/from [employer] Transfers Sit to Stand: Min guard (1st trial standing from bed pt unable to stand fully upright but able to sit back down safely; 2nd trial standing from bed pt appearing steady and safe; x2 trials standing from recliner) Stand pivot transfers: Min guard (stand step turn bed to recliner)       General transfer comment: initial vc's for technique  Ambulation/Gait Ambulation/Gait assistance: Min guard;Supervision Gait Distance (Feet):  (200 feet then 100 feet) Assistive device: IV Pole;None;Straight cane       General Gait Details: 1st ambulation trial (200 feet total) pt held onto IV pole for support first 80 feet and then ambulated rest of distance without UE support but with increased distance ambulating pt noted with decreased L LE foot clearance and heelstrike and pt with difficulty advancing L LE at times (pt able to self correct balance though); 2nd ambulation trial (100 feet) trialed ambulating with SPC (initial vc's for technique) and pt appearing steady and safe with ambulation and able to consistently advance L LE safely (significant improved gait mechanics noted).  Stairs            Wheelchair Mobility    Modified Rankin (Stroke Patients Only)  Balance Overall balance assessment: Needs assistance Sitting-balance support: No upper extremity supported;Feet supported Sitting balance-Leahy Scale: Normal Sitting balance - Comments: steady sitting reaching outside BOS   Standing balance support: No upper extremity supported Standing balance-Leahy Scale:  Good Standing balance comment: steady standing reaching within BOS                             Pertinent Vitals/Pain Pain Assessment: No/denies pain  Vitals (HR and O2 on room air) stable and WFL throughout treatment session.    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  A couple of falls (tripped on steps) in past 6 months.     Hand Dominance   Dominant Hand: Right    Extremity/Trunk Assessment   Upper Extremity Assessment Upper Extremity Assessment: Overall WFL for tasks assessed;Defer to OT evaluation    Lower Extremity Assessment Lower Extremity Assessment: Overall WFL for tasks assessed (at least 4/5 B hip flexion, knee flexion/extension, and DF/PF AROM)    Cervical / Trunk Assessment Cervical / Trunk Assessment: Normal  Communication   Communication: No difficulties  Cognition Arousal/Alertness: Awake/alert Behavior During Therapy: WFL for tasks assessed/performed Overall Cognitive Status: Within Functional Limits for tasks assessed                                        General Comments   Nursing cleared pt for participation in physical therapy.  Pt agreeable to PT session.    Exercises  Gait training   Assessment/Plan    PT Assessment Patient needs continued PT services  PT Problem List Decreased strength;Decreased balance;Decreased mobility;Decreased knowledge of use of DME;Decreased knowledge of precautions       PT Treatment Interventions DME instruction;Gait training;Stair  training;Functional mobility training;Therapeutic activities;Therapeutic exercise;Balance training;Patient/family education    PT Goals (Current goals can be found in the Care Plan section)  Acute Rehab PT Goals Patient Stated Goal: to go home PT Goal Formulation: With patient Time For Goal Achievement: 06/11/20 Potential to Achieve Goals: Good    Frequency Min 2X/week   Barriers to discharge        Co-evaluation               AM-PAC PT "6 Clicks" Mobility  Outcome Measure Help needed turning from your back to your side while in a flat bed without using bedrails?: None Help needed moving from lying on your back to sitting on the side of a flat bed without using bedrails?: A Little Help needed moving to and from a bed to a chair (including a wheelchair)?: A Little Help needed standing up from a chair using your arms (e.g., wheelchair or bedside chair)?: A Little Help needed to walk in hospital room?: A Little Help needed climbing 3-5 steps with a railing? : A Little 6 Click Score: 19    End of Session Equipment Utilized During Treatment: Gait belt Activity Tolerance: Patient tolerated treatment well Patient left:  in chair;with call bell/phone within reach;with chair alarm set Nurse Communication: Mobility status;Precautions PT Visit Diagnosis: Other abnormalities of gait and mobility (R26.89);Muscle weakness (generalized) (M62.81);History of falling (Z91.81)    Time: 8841-6606 PT Time Calculation (min) (ACUTE ONLY): 45 min   Charges:   PT Evaluation $PT Eval Low Complexity: 1 Low PT Treatments $Gait Training: 23-37 mins       Leitha Bleak, PT 05/28/20, 12:24 PM

## 2020-05-28 NOTE — Progress Notes (Addendum)
Pharmacy Antibiotic Note  Brent Glass is a 80 y.o. male admitted on 05/27/2020 with bacteremia.  Pharmacy has been consulted for Cefazolin dosing.   Contacted MD wi/ BCID results: 2 of 4 bottles (1 anaerobic, 1 aerobic) with Staph Epi, no resistance. Pt is not currently on any abx with WBC and Neutrophils both up from prior lab on 3/09 and max temp of 99.5 on 05/28/20.  Plan: Ordered Cefazolin 2 gm q8h per Abx guidelines and renal fxn. Pharmacy will continue to follow SCr and ajust dose if warranted.  Height: 6' (182.9 cm) Weight: 88.2 kg (194 lb 7.1 oz) IBW/kg (Calculated) : 77.6  Temp (24hrs), Avg:98.5 F (36.9 C), Min:97.9 F (36.6 C), Max:99.5 F (37.5 C)  Recent Labs  Lab 05/27/20 0119 05/27/20 0434  WBC 13.8*  --   CREATININE 1.09  --   LATICACIDVEN  --  1.6    Estimated Creatinine Clearance: 59.3 mL/min (by C-G formula based on SCr of 1.09 mg/dL).    No Known Allergies  Antimicrobials this admission: 03/23 Cefazolin >>   Microbiology results: 03/22 BCx:  2 of 4 bottles (1 anaerobic, 1 aerobic) with Staph Epi, no resistance.  Thank you for allowing pharmacy to be a part of this patient's care.  Renda Rolls, PharmD, [facility] 05/28/2020 12:56 AM

## 2020-05-28 NOTE — TOC Initial Note (Signed)
Transition of Care [facility]) - Initial/Assessment Note    Patient Details  Name: Brent Glass MRN: [id number] Date of Birth: 08-20-1940  [facility]) CM/SW Contact:    Kerin Salen, RN Phone Number: 05/28/2020, 1:57 PM  Clinical Narrative: Patient asleep, daughter at bedside. Daughter, [name] states she lives mile and half from patient. Patient lives alone able to do ADL's, drives to medical appointments, does his own shopping and cooking. Patient still drives the school bus. Uses [employer]. Daughter states patient will be discharge to home will never agree to SNF. Denies use of assistive device nor HH services. Will continue to track for discharge needs.                  Expected Discharge Plan: Home/Self Care Barriers to Discharge: Continued Medical Work up   Patient Goals and CMS Choice Patient states their goals for this hospitalization and ongoing recovery are:: To return home says daughter.   Choice offered to / list presented to : NA  Expected Discharge Plan and Services Expected Discharge Plan: Home/Self Care In-house Referral: Clinical Social Work Discharge Planning Services: NA Post Acute Care Choice: NA Living arrangements for the past 2 months: Single Family Home                 DME Arranged: N/A DME Agency: NA       HH Arranged: NA HH Agency: NA        Prior Living Arrangements/Services Living arrangements for the past 2 months: [place] with:: Self Patient language and need for interpreter reviewed:: No Do you feel safe going back to the place where you live?: Yes (Per Daughter, Marisa Hufstetler)      Need for Family Participation in Patient Care: Yes (Comment) Care giver support system in place?: Yes (comment)   Criminal Activity/Legal Involvement Pertinent to Current Situation/Hospitalization: No - Comment as needed  Activities of Daily Living Home Assistive Devices/Equipment: None ADL Screening  (condition at time of admission) Patient's cognitive ability adequate to safely complete daily activities?: No Is the patient deaf or have difficulty hearing?: No Does the patient have difficulty seeing, even when wearing glasses/contacts?: Yes Does the patient have difficulty concentrating, remembering, or making decisions?: Yes Patient able to express need for assistance with ADLs?: No Does the patient have difficulty dressing or bathing?: Yes Independently performs ADLs?: No Communication: Independent Dressing (OT): Dependent Is this a change from baseline?: Change from baseline, expected to last <3days Grooming: Dependent Is this a change from baseline?: Change from baseline, expected to last <3 days Feeding: Dependent Is this a change from baseline?: Change from baseline, expected to last <3 days Bathing: Dependent Is this a change from baseline?: Change from baseline, expected to last <3 days Toileting: Dependent Is this a change from baseline?: Change from baseline, expected to last <3 days In/Out Bed: Dependent Is this a change from baseline?: Change from baseline, expected to last <3 days Walks in Home: Dependent Is this a change from baseline?: Change from baseline, expected to last <3 days Does the patient have difficulty walking or climbing stairs?: Yes Weakness of Legs: Both Weakness of Arms/Hands: Both  Permission Sought/Granted                  Emotional Assessment Appearance:: Appears stated age Attitude/Demeanor/Rapport: Unable to Assess (Patient asleep, daughter at bedside.) Affect (typically observed): Unable to Assess   Alcohol / Substance Use: Not Applicable Psych Involvement: No (  comment)  Admission diagnosis:  Left-sided weakness [R53.1] Acute CVA (cerebrovascular accident) ([place]) [I63.9] Altered mental status, unspecified altered mental status type [R41.82] Patient Active Problem List   Diagnosis Date Noted  . Palliative care encounter   .  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 Acute confusion 05/27/2020  . Left-sided weakness 05/27/2020  . Diabetes mellitus without complication ([place]) 12/30/8525  . HTN (hypertension) 05/27/2020  . Acute CVA (cerebrovascular accident) ([place]) 05/27/2020  . Genetic testing 03/26/2020  . Family history of lung cancer   . Family history of lymphoma   . Prostate cancer metastatic to multiple sites ([place]) 02/04/2020  . Goals of care, counseling/discussion 02/04/2020  . Multiple lung nodules 01/23/2020  . BPH with obstruction/lower urinary tract symptoms 11/21/2014  . Elevated PSA 11/21/2014   PCP:  Donnie Coffin, MD Pharmacy:   [id number] - [place], [place] [place] [address] [address] Phone: [phone] Fax: [phone]  [place], [place] - [address] [place] [address] Phone: [phone] Fax: [phone]     Social Determinants of Health (SDOH) Interventions    Readmission Risk Interventions No flowsheet data found.

## 2020-05-28 NOTE — Evaluation (Signed)
Occupational Therapy Evaluation Patient Details Name: Brent Glass MRN: [id number] DOB: 1940/07/21 Today's Date: 05/28/2020    History of Present Illness Pt is an 80 y.o. male presenting to hospital 3/22 with HA and AMS; pt noted to have been dragging L leg with walking last month.  New weakness L arm.  Pt admitted with acute neurologic deficit (suspect acute CVA), hypertensive urgency, and carboxyhemoglobinemia. CT of head and MRI of brain (-) for acute intracranial abnormality.  PMH includes prostate CA with bony metastasis, htn, and DM.   Clinical Impression   Pt was seen for OT evaluation this date. 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 Pt lives alone and has 2 adult daughters nearby that are available PRN. Currently pt demonstrates mild impairments as described below (See OT problem list) which functionally limit his ability to perform ADL/self-care tasks. Pt currently requires supervision to CGA for STS ADL and ADL mobility, mildly unsteady with dynamic balance but no overt LOB. Pt/dtr instructed in falls prevention, figure 4 for LB dressing in sitting position, activity pacing. Pt/dtr verbalized understanding. Pt would benefit from skilled OT services to address noted impairments and functional limitations (see below for any additional details) in order to maximize safety and independence while minimizing falls risk and caregiver burden. Do not anticipate need for skilled OT following discharge.     Follow Up Recommendations  No OT follow up    Equipment Recommendations  None recommended by OT    Recommendations for Other Services       Precautions / Restrictions Precautions Precautions: Fall Precaution Comments: Aspiration Restrictions Weight Bearing Restrictions: No      Mobility Bed Mobility Overal bed mobility: Modified Independent             General bed mobility comments: min increased  effort    Transfers Overall transfer level: Needs assistance Equipment used: None Transfers: Sit to/from Stand Sit to Stand: Supervision;Min guard              Balance Overall balance assessment: Mild deficits observed, not formally tested                                         ADL either performed or assessed with clinical judgement   ADL                                         General ADL Comments: supervision for LB ADL with increased time/effort to perform; close to baseline     Vision Patient Visual Report: No change from baseline       Perception     Praxis      Pertinent Vitals/Pain Pain Assessment: No/denies pain     Hand Dominance Right   Extremity/Trunk Assessment Upper Extremity Assessment Upper Extremity Assessment: Overall WFL for tasks assessed (at least 4+/5 bilat)   Lower Extremity Assessment Lower Extremity Assessment: Overall WFL for tasks assessed (at least 4/5 B hip flexion, knee flexion/extension, and DF/PF AROM)   Cervical / Trunk Assessment Cervical / Trunk Assessment: Normal   Communication Communication Communication: No difficulties   Cognition Arousal/Alertness: Awake/alert Behavior During Therapy: WFL for tasks assessed/performed Overall Cognitive Status: Within Functional Limits for tasks assessed  General Comments       Exercises Other Exercises Other Exercises: Pt/dtr instructed in falls prevention, figure 4 for LB dressing in sitting position, activity pacing   Shoulder Instructions      Home Living Family/patient expects to be discharged to:: Private residence Living Arrangements: Alone Available Help at Discharge: Family;Available PRN/intermittently Type of Home: House Home Access: Stairs to enter [employer] of Steps: 3 Entrance Stairs-Rails: None Home Layout: One level     Bathroom Shower/Tub: Arts administrator: Standard     Home Equipment: None (needs to check if have cane)          Prior Functioning/Environment Level of Independence: Independent        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  A couple of falls (tripped on steps) in past 6 months.        OT Problem List: Decreased activity tolerance;Impaired balance (sitting and/or standing)      OT Treatment/Interventions: Self-care/ADL training;Therapeutic exercise;Therapeutic activities;DME and/or AE instruction;Energy conservation;Patient/family education;Balance training    OT Goals(Current goals can be found in the care plan section) Acute Rehab OT Goals Patient Stated Goal: to go home OT Goal Formulation: With patient/family Time For Goal Achievement: 06/11/20 Potential to Achieve Goals: Good ADL Goals Pt Will Perform Lower Body Dressing: with modified independence;sit to/from stand Pt Will Transfer to Toilet: with modified independence;ambulating Additional ADL Goal #1: Pt will verbalize plan to implement at least 1 learned ECS during ADL  OT Frequency: Min 1X/week   Barriers to D/C:            Co-evaluation              AM-PAC OT "6 Clicks" Daily Activity     Outcome Measure Help from another person eating meals?: None Help from another person taking care of personal grooming?: None Help from another person toileting, which includes using toliet, bedpan, or urinal?: A Little Help from another person bathing (including washing, rinsing, drying)?: A Little Help from another person to put on and taking off regular upper body clothing?: None Help from another person to put on and taking off regular lower body clothing?: A Little 6 Click Score: 21   End of Session    Activity Tolerance: Patient tolerated treatment well Patient left: in bed;with call bell/phone within reach;with bed alarm set;with family/visitor present  OT Visit Diagnosis: Other abnormalities  of gait and mobility (R26.89)                Time: 2330-0762 OT Time Calculation (min): 14 min Charges:  OT General Charges $OT Visit: 1 Visit OT Evaluation $OT Eval Low Complexity: 1 Low OT Treatments $Self Care/Home Management : 8-22 mins  Hanley Hays, MPH, MS, OTR/L ascom [phone] 05/28/20, 4:22 PM

## 2020-05-28 NOTE — Progress Notes (Signed)
PROGRESS NOTE    Brent Glass  EPP:[id number] DOB: 29-Feb-1941 DOA: 05/27/2020 PCP: Donnie Coffin, MD   Brief Narrative: Taken from H&P. [name] a 80 y.o.malewith medical history significant forProstate cancer metastatic to bonediagnosed November 2021,diabetes, hypertension, brought in by daughter with a complaint of headache that started 2 hours prior to arrival unrelieved with tramadol. Daughter also noticed that patient is somewhat confused. Mildly elevated carboxyhemoglobin, but patient is a chronic smoker. CT head and MRI brain was without any acute abnormality but did show multiple osseous mets involving the skull. He was admitted to complete CVA work-up, neurology was also consulted and he signed off as there was no stroke.  They did not recommend any further work-up. Echocardiogram with normal EF and grade 1 diastolic dysfunction. 2 out of 4 blood cultures bottles, both from left extremity growing staph epidermidis-most likely contaminant as patient is afebrile although having mild leukocytosis.  Subjective: Patient appears to be at his baseline when seen today.  He was more alert and oriented.  Denies any complaint.  He was starting to work with PT and did well with them.  Ate his breakfast.  Assessment & Plan:   Principal Problem:   Acute CVA (cerebrovascular accident) [facility]) Active Problems:   Prostate cancer metastatic to multiple sites [facility])   Carboxyhemoglobinemia   Acute confusion   Diabetes mellitus without complication ([place])   HTN (hypertension)   Palliative care encounter  Altered mental status.  Patient presented with headache and confusion.  No appeared at his baseline.  Mildly elevated carboxyhemoglobin but patient is a smoker so less likely carbon monoxide poisoning.  Imaging is negative for any acute infarct, completed the stroke work-up. He was very lethargic yesterday as received Ativan earlier in the day. -Avoid sedating  medications  Intermittent elevation of blood pressure.  Patient presented with hypertensive urgency with markedly elevated blood pressure.  Apparently he was taking antihypertensives but was recently taken off by his provider due to softer blood pressure.  Blood pressure with intermittent elevation. -Start him on low-dose losartan. -We will need close monitoring by PCP on discharge.  Positive blood cultures with staph epidermidis.  Most likely a contaminant as they were positive on 1 extremity and negative on the second.  Patient is afebrile with mild leukocytosis which seems improving, leukocytosis can be due to underlying malignancy. He was started on cefazolin per protocol overnight. -Repeat blood cultures. -Discontinue antibiotics and monitor.  Metastatic prostate cancer.  Apparently having progression of his osseous mets as CT head and MRI shows skull and spine involvement. Palliative care was consulted-patient would like to continue current level of management at this time. -Continue home dose of Zytiga and prednisone. -PT is recommending outpatient therapy.  Vitamin B12 deficiency.  Vitamin B12 ordered by neurology came back less than 50. -Start him on IM and p.o. supplements. -Can follow-up with PCP on discharge.  Diabetes mellitus without complications. -Continue with SSI  Objective: Vitals:   05/27/20 2004 05/28/20 0420 05/28/20 0802 05/28/20 1139  BP: (!) 167/72 (!) 169/74 (!) 150/63 119/73  Pulse: 80 81 65 72  Resp: 17 17 18 18   Temp: 97.9 F (36.6 C) 98.3 F (36.8 C) 98.1 F (36.7 C)   TempSrc: Oral  Oral   SpO2: 95%  97% 100%  Weight:  88.1 kg    Height:        Intake/Output Summary (Last 24 hours) at 05/28/2020 1341 Last data filed at 05/28/2020 1015 Gross per 24 hour  Intake 1080 ml  Output 550 ml  Net 530 ml   Filed Weights   05/27/20 0040 05/27/20 1531 05/28/20 0420  Weight: 94.8 kg 88.2 kg 88.1 kg    Examination:  General exam: Appears calm and  comfortable  Respiratory system: Clear to auscultation. Respiratory effort normal. Cardiovascular system: S1 & S2 heard, RRR.  Gastrointestinal system: Soft, nontender, nondistended, bowel sounds positive. Central nervous system: Alert and oriented. No focal neurological deficits. Extremities: No edema, no cyanosis, pulses intact and symmetrical. Psychiatry: Judgement and insight appear normal.    DVT prophylaxis: Lovenox Code Status: Full Family Communication: Daughter at bedside. Disposition Plan:  Status is: Inpatient  Remains inpatient appropriate because:Inpatient level of care appropriate due to severity of illness   Dispo: The patient is from: Home              Anticipated d/c is to: Home              Patient currently is not medically stable to d/c.   Difficult to place patient No                 Level of care: Med-Surg  All the records are reviewed and case discussed with Care Management/Social Worker. Management plans discussed with the patient, nursing and they are in agreement.  Consultants:   Neurology  Procedures:  Antimicrobials:   Data Reviewed: I have personally reviewed following labs and imaging studies  CBC: Recent Labs  Lab 05/27/20 0119 05/28/20 0725  WBC 13.8* 12.0*  NEUTROABS 9.9* 6.9  HGB 12.5* 11.8*  HCT 41.0 35.7*  MCV 94.9 89.0  PLT 339 976   Basic Metabolic Panel: Recent Labs  Lab 05/27/20 0119 05/28/20 0553  NA 138 140  K 3.6 3.4*  CL 104 106  CO2 23 24  GLUCOSE 184* 70  BUN 13 12  CREATININE 1.09 1.03  CALCIUM 9.8 9.5  MG  --  1.7   GFR: Estimated Creatinine Clearance: 62.8 mL/min (by C-G formula based on SCr of 1.03 mg/dL). Liver Function Tests: Recent Labs  Lab 05/27/20 0119  AST 14*  ALT 10  ALKPHOS 122  BILITOT 0.6  PROT 7.5  ALBUMIN 4.0   No results for input(s): LIPASE, AMYLASE in the last 168 hours. No results for input(s): AMMONIA in the last 168 hours. Coagulation Profile: Recent Labs  Lab  05/27/20 0433  INR 1.0   Cardiac Enzymes: No results for input(s): CKTOTAL, CKMB, CKMBINDEX, TROPONINI in the last 168 hours. BNP (last 3 results) No results for input(s): PROBNP in the last 8760 hours. HbA1C: No results for input(s): HGBA1C in the last 72 hours. CBG: Recent Labs  Lab 05/27/20 1144 05/27/20 1736 05/27/20 2036 05/28/20 0803 05/28/20 1139  GLUCAP 119* 90 103* 87 174*   Lipid Profile: Recent Labs    05/27/20 0434  CHOL 215*  HDL 60  LDLCALC 127*  TRIG 138  CHOLHDL 3.6   Thyroid Function Tests: Recent Labs    05/27/20 0437  TSH 0.383   Anemia Panel: Recent Labs    05/27/20 0837  VITAMINB12 <50*   Sepsis Labs: Recent Labs  Lab 05/27/20 0434  LATICACIDVEN 1.6    Recent Results (from the past 240 hour(s))  Resp Panel by RT-PCR (Flu A&B, Covid) Nasopharyngeal Swab     Status: None   Collection Time: 05/27/20  1:22 AM   Specimen: Nasopharyngeal Swab; Nasopharyngeal(NP) swabs in vial transport medium  Result Value Ref Range Status   SARS Coronavirus 2  by RT PCR NEGATIVE NEGATIVE Final    Comment: (NOTE) SARS-CoV-2 target nucleic acids are NOT DETECTED.  The SARS-CoV-2 RNA is generally detectable in upper respiratory specimens during the acute phase of infection. The lowest concentration of SARS-CoV-2 viral copies this assay can detect is 138 copies/mL. A negative result does not preclude SARS-Cov-2 infection and should not be used as the sole basis for treatment or other patient management decisions. A negative result may occur with  improper specimen collection/handling, submission of specimen other than nasopharyngeal swab, presence of viral mutation(s) within the areas targeted by this assay, and inadequate number of viral copies(<138 copies/mL). A negative result must be combined with clinical observations, patient history, and epidemiological information. The expected result is Negative.  Fact Sheet for Patients:   EntrepreneurPulse.com.au  Fact Sheet for Healthcare Providers:  IncredibleEmployment.be  This test is no t yet approved or cleared by the [place] FDA and  has been authorized for detection and/or diagnosis of SARS-CoV-2 by FDA under an Emergency Use Authorization (EUA). This EUA will remain  in effect (meaning this test can be used) for the duration of the COVID-19 declaration under Section 564(b)(1) of the Act, 21 U.S.C.section 360bbb-3(b)(1), unless the authorization is terminated  or revoked sooner.       Influenza A by PCR NEGATIVE NEGATIVE Final   Influenza B by PCR NEGATIVE NEGATIVE Final    Comment: (NOTE) The Xpert Xpress SARS-CoV-2/FLU/RSV plus assay is intended as an aid in the diagnosis of influenza from Nasopharyngeal swab specimens and should not be used as a sole basis for treatment. Nasal washings and aspirates are unacceptable for Xpert Xpress SARS-CoV-2/FLU/RSV testing.  Fact Sheet for Patients: EntrepreneurPulse.com.au  Fact Sheet for Healthcare Providers: IncredibleEmployment.be  This test is not yet approved or cleared by the [place] FDA and has been authorized for detection and/or diagnosis of SARS-CoV-2 by FDA under an Emergency Use Authorization (EUA). This EUA will remain in effect (meaning this test can be used) for the duration of the COVID-19 declaration under Section 564(b)(1) of the Act, 21 U.S.C. section 360bbb-3(b)(1), unless the authorization is terminated or revoked.  Performed at [facility], [address] [place], [place] [address]   Culture, blood (Routine X 2) w Reflex to ID Panel     Status: None (Preliminary result)   Collection Time: 05/27/20  4:33 AM   Specimen: BLOOD  Result Value Ref Range Status   Specimen Description BLOOD LEFT HAND  Final   Special Requests   Final    BOTTLES DRAWN AEROBIC ONLY Blood Culture adequate volume    Culture  Setup Time   Final    GRAM POSITIVE COCCI AEROBIC BOTTLE ONLY CRITICAL VALUE NOTED.  VALUE IS CONSISTENT WITH PREVIOUSLY REPORTED AND CALLED VALUE.    Culture   Final    NO GROWTH 1 DAY Performed at [facility], [address] [place], [place] [address]    Report Status PENDING  Incomplete  Culture, blood (Routine X 2) w Reflex to ID Panel     Status: None (Preliminary result)   Collection Time: 05/27/20  4:34 AM   Specimen: BLOOD  Result Value Ref Range Status   Specimen Description BLOOD LEFT [facility]  Final   Special Requests   Final    BOTTLES DRAWN AEROBIC AND ANAEROBIC Blood Culture adequate volume   Culture  Setup Time   Final    GRAM POSITIVE COCCI IN BOTH AEROBIC AND ANAEROBIC BOTTLES Organism ID to follow CRITICAL  RESULT CALLED TO, READ BACK BY AND VERIFIED WITHLloyd Huger PHARMD 0258 05/27/20 HNM Performed at [facility], [address] [place], [place] [address]    Culture [facility] POSITIVE COCCI  Final   Report Status PENDING  Incomplete  Blood Culture ID Panel (Reflexed)     Status: Abnormal   Collection Time: 05/27/20  4:34 AM  Result Value Ref Range Status   Enterococcus faecalis NOT DETECTED NOT DETECTED Final   Enterococcus Faecium NOT DETECTED NOT DETECTED Final   Listeria monocytogenes NOT DETECTED NOT DETECTED Final   Staphylococcus species DETECTED (A) NOT DETECTED Final    Comment: CRITICAL RESULT CALLED TO, READ BACK BY AND VERIFIED WITH: NATHAN BELUE PHARMD 2344 05/27/20 HNM    Staphylococcus aureus (BCID) NOT DETECTED NOT DETECTED Final   Staphylococcus epidermidis DETECTED (A) NOT DETECTED Final    Comment: CRITICAL RESULT CALLED TO, READ BACK BY AND VERIFIED WITH: NATHAN BELUE PHARMD 2344 05/27/20 HNM    Staphylococcus lugdunensis NOT DETECTED NOT DETECTED Final   Streptococcus species NOT DETECTED NOT DETECTED Final   Streptococcus agalactiae NOT DETECTED NOT DETECTED Final   Streptococcus pneumoniae NOT DETECTED NOT  DETECTED Final   Streptococcus pyogenes NOT DETECTED NOT DETECTED Final   A.calcoaceticus-baumannii NOT DETECTED NOT DETECTED Final   Bacteroides fragilis NOT DETECTED NOT DETECTED Final   Enterobacterales NOT DETECTED NOT DETECTED Final   Enterobacter cloacae complex NOT DETECTED NOT DETECTED Final   Escherichia coli NOT DETECTED NOT DETECTED Final   Klebsiella aerogenes NOT DETECTED NOT DETECTED Final   Klebsiella oxytoca NOT DETECTED NOT DETECTED Final   Klebsiella pneumoniae NOT DETECTED NOT DETECTED Final   Proteus species NOT DETECTED NOT DETECTED Final   Salmonella species NOT DETECTED NOT DETECTED Final   Serratia marcescens NOT DETECTED NOT DETECTED Final   Haemophilus influenzae NOT DETECTED NOT DETECTED Final   Neisseria meningitidis NOT DETECTED NOT DETECTED Final   Pseudomonas aeruginosa NOT DETECTED NOT DETECTED Final   Stenotrophomonas maltophilia NOT DETECTED NOT DETECTED Final   Candida albicans NOT DETECTED NOT DETECTED Final   Candida auris NOT DETECTED NOT DETECTED Final   Candida glabrata NOT DETECTED NOT DETECTED Final   Candida krusei NOT DETECTED NOT DETECTED Final   Candida parapsilosis NOT DETECTED NOT DETECTED Final   Candida tropicalis NOT DETECTED NOT DETECTED Final   Cryptococcus neoformans/gattii NOT DETECTED NOT DETECTED Final   Methicillin resistance mecA/C NOT DETECTED NOT DETECTED Final    Comment: Performed at [facility], [address], [place], [place] [address]     Radiology Studies: DG Chest 2 View  Result Date: 05/27/2020 CLINICAL DATA:  80 year old male with altered mental status. EXAM: CHEST - 2 VIEW COMPARISON:  Chest radiograph dated 01/16/2020. FINDINGS: Left lung base linear atelectasis/scarring. No focal consolidation, pleural effusion, pneumothorax. Mild cardiomegaly. Atherosclerotic calcification of the aortic arch. No acute osseous pathology. IMPRESSION: No active cardiopulmonary disease. Electronically Signed   By: Anner Crete M.D.   On: 05/27/2020 02:46   CT HEAD WO CONTRAST  Result Date: 05/27/2020 CLINICAL DATA:  Right-sided headache with left-sided defects EXAM: CT HEAD WITHOUT CONTRAST TECHNIQUE: Contiguous axial images were obtained from the base of the skull through the vertex without intravenous contrast. COMPARISON:  PET-CT 01/24/2020 FINDINGS: Brain: No evidence of acute infarction, hemorrhage, hydrocephalus, extra-axial collection, visible mass lesion or mass effect. Diffuse prominence of the ventricles, cisterns and sulci compatible with moderate parenchymal volume loss. Incidental note made of a cavum septum pellucidum et vergae. Patchy  areas of white matter hypoattenuation are most compatible with chronic microvascular angiopathy. Benign dural calcifications. Vascular: Atherosclerotic calcification of the carotid siphons and intradural vertebral arteries. No hyperdense vessel. Skull: No calvarial fracture. No scalp swelling or hematoma. Few sclerotic foci seen in the colitis and left occipital condyle as well as the anterior right parietal bone and occipital bone likely reflecting multifocal osseous metastatic disease in the setting of prostate carcinoma. Sinuses/Orbits: Paranasal sinuses and mastoid air cells are predominantly clear. Orbital structures are unremarkable aside from prior lens extractions. Other: None. IMPRESSION: 1. No acute intracranial findings. 2. Moderate parenchymal volume loss and chronic microvascular angiopathy. 3. Numerous sclerotic foci throughout the calvarium and skull base concerning for osseous metastases in the setting of prostate carcinoma. Electronically Signed   By: Lovena Le M.D.   On: 05/27/2020 01:47   MR Brain W and Wo Contrast  Result Date: 05/27/2020 CLINICAL DATA:  Mental status change. Prostate cancer with bone metastases. Diabetes and hypertension. EXAM: MRI HEAD WITHOUT AND WITH CONTRAST TECHNIQUE: Multiplanar, multiecho pulse sequences of the brain and  surrounding structures were obtained without and with intravenous contrast. CONTRAST:  7.55mL GADAVIST GADOBUTROL 1 MMOL/ML IV SOLN COMPARISON:  CT head without contrast 05/27/2020 FINDINGS: Brain: No acute infarct, hemorrhage, or mass lesion is present. Mild atrophy and moderate diffuse white matter disease is present. Focal cortical T2 hyperintensities in the high parietal lobe and posterior right middle frontal gyrus are compatible with small remote cortical infarcts. Asymmetric cortical thinning, encephalomalacia, and volume loss is compatible with a remote right occipital lobe infarct. Remote left occipital lobe infarcts noted without volume loss. Cavum septum pellucidum incidentally noted. The internal auditory canals are within normal limits. The brainstem and cerebellum are within normal limits. Postcontrast images demonstrate no pathologic enhancement. The coronal postcontrast images are severely degraded by patient motion. Vascular: Flow is present in the major intracranial arteries. Skull and upper cervical spine: Adenoid hypertrophy noted. No discrete lesion present. Sclerotic skull lesions consistent with known metastases. Sinuses/Orbits: Polyp or mucous retention cyst is present the right maxillary sinus. The paranasal sinuses and mastoid air cells are otherwise clear. Bilateral lens replacements are noted. Globes and orbits are otherwise unremarkable. Bilateral exophthalmos noted. No orbital mass present. IMPRESSION: 1. No acute intracranial abnormality. 2. Mild atrophy and moderate diffuse white matter disease likely reflects the sequela of chronic microvascular ischemia. 3. 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 4. Remote occipital lobe infarcts bilaterally. Volume loss is more noted on the right. 5. Sclerotic areas in the calvarium consistent with metastatic disease. Electronically Signed   By: San Morelle M.D.   On:  05/27/2020 11:39   ECHOCARDIOGRAM COMPLETE  Result Date: 05/27/2020    ECHOCARDIOGRAM REPORT   Patient Name:   CHAMP KEETCH Date of Exam: 05/27/2020 Medical Rec #:  [id number]      Height:       72.0 in Accession #:    [id number]     Weight:       209.0 lb Date of Birth:  Jul 10, 1940      BSA:          2.171 m Patient Age:    28 years       BP:           153/77 mmHg Patient Gender: M              HR:  87 bpm. Exam Location:  [facility] Procedure: 2D Echo, Color Doppler, Cardiac Doppler and Strain Analysis Indications:     Stroke I63.9  History:         Patient has no prior history of Echocardiogram examinations.                  Risk Factors:Hypertension and Diabetes.  Sonographer:     Sherrie Sport RDCS (AE) Referring Phys:  [id number] Athena Masse Diagnosing Phys: Bartholome Bill MD  Sonographer Comments: Global longitudinal strain was attempted. IMPRESSIONS  1. Left ventricular ejection fraction, by estimation, is 65 to 70%. The left ventricle has normal function. The left ventricle has no regional wall motion abnormalities. There is mild left ventricular hypertrophy. Left ventricular diastolic parameters are consistent with Grade I diastolic dysfunction (impaired relaxation).  2. Right ventricular systolic function is normal. The right ventricular size is normal.  3. The mitral valve is grossly normal. Trivial mitral valve regurgitation.  4. The aortic valve is tricuspid. Aortic valve regurgitation is not visualized. FINDINGS  Left Ventricle: Left ventricular ejection fraction, by estimation, is 65 to 70%. The left ventricle has normal function. The left ventricle has no regional wall motion abnormalities. The left ventricular internal cavity size was normal in size. There is  mild left ventricular hypertrophy. Left ventricular diastolic parameters are consistent with Grade I diastolic dysfunction (impaired relaxation). Right Ventricle: The right ventricular size is normal. No increase in right ventricular wall  thickness. Right ventricular systolic function is normal. Left Atrium: Left atrial size was normal in size. Right Atrium: Right atrial size was normal in size. Pericardium: There is no evidence of pericardial effusion. Mitral Valve: The mitral valve is grossly normal. Trivial mitral valve regurgitation. Tricuspid Valve: The tricuspid valve is grossly normal. Tricuspid valve regurgitation is mild. Aortic Valve: The aortic valve is tricuspid. Aortic valve regurgitation is not visualized. Aortic valve mean gradient measures 3.0 mmHg. Aortic valve peak gradient measures 4.4 mmHg. Aortic valve area, by VTI measures 3.34 cm. Pulmonic Valve: The pulmonic valve was grossly normal. Pulmonic valve regurgitation is mild. Aorta: The aortic root is normal in size and structure. IAS/Shunts: The interatrial septum was not well visualized.  LEFT VENTRICLE PLAX 2D LVIDd:         4.44 cm  Diastology LVIDs:         2.75 cm  LV e' medial:    4.79 cm/s LV PW:         1.13 cm  LV E/e' medial:  11.4 LV IVS:        1.07 cm  LV e' lateral:   9.36 cm/s LVOT diam:     2.20 cm  LV E/e' lateral: 5.9 LV SV:         70 LV SV Index:   32 LVOT Area:     3.80 cm                          3D Volume EF:                         3D EF:        53 %                         LV EDV:       111 ml  LV ESV:       52 ml                         LV SV:        59 ml RIGHT VENTRICLE RV Basal diam:  3.72 cm RV S prime:     19.10 cm/s TAPSE (M-mode): 3.8 cm LEFT ATRIUM           Index       RIGHT ATRIUM           Index LA diam:      3.60 cm 1.66 cm/m  RA Area:     23.20 cm LA Vol (A2C): 47.8 ml 22.02 ml/m RA Volume:   77.20 ml  35.56 ml/m LA Vol (A4C): 49.7 ml 22.89 ml/m  AORTIC VALVE                   PULMONIC VALVE AV Area (Vmax):    3.31 cm    PV Vmax:        0.90 m/s AV Area (Vmean):   3.32 cm    PV Peak grad:   3.2 mmHg AV Area (VTI):     3.34 cm    RVOT Peak grad: 6 mmHg AV Vmax:           105.00 cm/s AV Vmean:          75.000  cm/s AV VTI:            0.208 m AV Peak Grad:      4.4 mmHg AV Mean Grad:      3.0 mmHg LVOT Vmax:         91.40 cm/s LVOT Vmean:        65.500 cm/s LVOT VTI:          0.183 m LVOT/AV VTI ratio: 0.88  AORTA Ao Root diam: 3.35 cm MITRAL VALVE               TRICUSPID VALVE MV Area (PHT): 6.90 cm    TR Peak grad:   9.1 mmHg MV Decel Time: 110 msec    TR Vmax:        151.00 cm/s MV E velocity: 54.80 cm/s MV A velocity: 89.10 cm/s  SHUNTS MV E/A ratio:  0.62        Systemic VTI:  0.18 m                            Systemic Diam: 2.20 cm Bartholome Bill MD Electronically signed by Bartholome Bill MD Signature Date/Time: 05/27/2020/10:38:43 AM    Final     Scheduled Meds: .  stroke: mapping our early stages of recovery book   Does not apply Once  . abiraterone acetate  1,000 mg Oral Daily  . cyanocobalamin  1,000 mcg Intramuscular Daily  . finasteride  5 mg Oral Daily  . heparin  5,000 Units Subcutaneous Q8H  . insulin aspart  0-15 Units Subcutaneous TID WC  . insulin aspart  0-5 Units Subcutaneous QHS  . linagliptin  5 mg Oral Daily  . predniSONE  5 mg Oral Q breakfast  . simvastatin  40 mg Oral q1800   Continuous Infusions: . sodium chloride 75 mL/hr at 05/27/20 0637     LOS: 1 day   Time spent: 40 minutes. More than 50% of the time was spent in counseling/coordination of care  Lorella Nimrod, MD [facility]  If 7PM-7AM, please contact night-coverage [email or web address]  05/28/2020, 1:41 PM   This record has been created using Systems analyst. Errors have been sought and corrected,but may not always be located. Such creation errors do not reflect on the standard of care.

## 2020-05-28 NOTE — Progress Notes (Signed)
Neurology Progress Note  Patient ID: Brent Glass is a 80 y.o. with PMHx of  has a past medical history of Arthritis, BPH (benign prostatic hyperplasia), BPH with obstruction/lower urinary tract symptoms, Diabetes ([place]), Disorder resulting from impaired renal function, Elevated PSA, Family history of lung cancer, Family history of lymphoma, Heart murmur, Hypertension, Knee pain, Lesion of lung, Low HDL (under 40), Obesity, and Urinary hesitancy.  Initially consulted for: Altered mental status   Subjective: -Back to baseline per patient and daughter at bedside -No acute complaints  Exam: Vitals:   05/28/20 0802 05/28/20 1139  BP: (!) 150/63 119/73  Pulse: 65 72  Resp: 18 18  Temp: 98.1 F (36.7 C)   SpO2: 97% 100%   Gen: In bed, comfortable  Resp: non-labored breathing, no grossly audible wheezing Cardiac: Perfusing extremities well  Abd: soft, nt  Neuro: MS: Able to name, repeat, oriented to date, situation, but unclear on the details of what happened yesterday CN: Visual fields full to confrontation, EOMI, face symmetric, tongue midline Motor: Moving all extremities equally and freely antigravity Sensory: Intact to light touch in all 4 extremities  Pertinent Labs: B12 level undetectable Lab Results  Component Value Date   TSH 0.383 05/27/2020  Thiamine pending   Impression: Patient's rapid improvement with normalization of his blood pressure and clearance of Ativan is supportive of hypertensive encephalopathy followed by medication induced encephalopathy, now resolved.  Low T90 level certainly puts him at risk of neuropathy and neurological impairment including of lower threshold for encephalopathy in the setting of acute stressors such as hypertensive emergency.  Recommendations: -B12 1000 mcg IM x7 days followed by 1000 mcg p.o. daily -PCP to follow-up B12 level in the future as well as pending thiamine level  -No further inpatient neurological work-up needed,  neurology will be available on an as-needed basis going forward.  Please reconsult should new questions arise.  Lesleigh Noe MD-PhD [facility] [phone]

## 2020-05-29 LAB — CULTURE, BLOOD (ROUTINE X 2): Special Requests: ADEQUATE

## 2020-05-29 LAB — GLUCOSE, CAPILLARY: Glucose-Capillary: 127 mg/dL — ABNORMAL HIGH (ref 70–99)

## 2020-05-29 MED ORDER — CYANOCOBALAMIN 1000 MCG/ML IJ SOLN: 1000.0000 ug | Freq: Every day | INTRAMUSCULAR | 0 refills | Status: DC

## 2020-05-29 MED ORDER — THIAMINE HCL 100 MG PO TABS: 100.0000 mg | ORAL_TABLET | Freq: Every day | ORAL | 0 refills | Status: DC

## 2020-05-29 MED ORDER — CYANOCOBALAMIN 2000 MCG PO TABS: 2000.0000 ug | ORAL_TABLET | Freq: Every day | ORAL | 1 refills | Status: DC

## 2020-05-29 NOTE — Plan of Care (Signed)
No acute events overnight, continuing plan of care.  Problem: Education: Goal: Knowledge of General Education information will improve Description: Including pain rating scale, medication(s)/side effects and non-pharmacologic comfort measures Outcome: Progressing   Problem: Health Behavior/Discharge Planning: Goal: Ability to manage health-related needs will improve Outcome: Progressing   Problem: Clinical Measurements: Goal: Ability to maintain clinical measurements within normal limits will improve Outcome: Progressing Goal: Will remain free from infection Outcome: Progressing Goal: Diagnostic test results will improve Outcome: Progressing Goal: Respiratory complications will improve Outcome: Progressing Goal: Cardiovascular complication will be avoided Outcome: Progressing   Problem: Activity: Goal: Risk for activity intolerance will decrease Outcome: Progressing   Problem: Nutrition: Goal: Adequate nutrition will be maintained Outcome: Progressing   Problem: Coping: Goal: Level of anxiety will decrease Outcome: Progressing   Problem: Elimination: Goal: Will not experience complications related to bowel motility Outcome: Progressing Goal: Will not experience complications related to urinary retention Outcome: Progressing   Problem: Pain Managment: Goal: General experience of comfort will improve Outcome: Progressing   Problem: Safety: Goal: Ability to remain free from injury will improve Outcome: Progressing   Problem: Skin Integrity: Goal: Risk for impaired skin integrity will decrease Outcome: Progressing

## 2020-05-29 NOTE — Discharge Summary (Signed)
Physician Discharge Summary  [name]:[id number] DOB: Apr 07, 1940 DOA: 05/27/2020  PCP: Donnie Coffin, MD  Admit date: 05/27/2020 Discharge date: 05/29/2020  Admitted From: Home Disposition: Home  Recommendations for Outpatient Follow-up:  1. Follow up with PCP in 1-2 weeks 2. Please obtain BMP/CBC in one week 3. Please follow up on the following pending results: Thiamine levels  Home Health: No Equipment/Devices: None Discharge Condition: Stable CODE STATUS:  Diet recommendation: Heart Healthy / Carb Modified   Brief/Interim Summary: Brent Glass a 80 y.o.malewith medical history significant forProstate cancer metastatic to bonediagnosed November 2021,diabetes, hypertension, brought in by daughter with a complaint of headache that started 2 hours prior to arrival unrelieved with tramadol. Daughter also noticed that patient is somewhat confused. Mildly elevated carboxyhemoglobin, but patient is a chronic smoker. CT head and MRI brain was without any acute abnormality but did show multiple osseous mets involving the skull. He was admitted to complete CVA work-up, neurology was also consulted and he signed off as there was no stroke.  They did not recommend any further work-up. Echocardiogram with normal EF and grade 1 diastolic dysfunction. 2 out of 4 blood cultures bottles, both from left extremity growing staph epidermidis-most likely contaminant as patient is afebrile although having mild leukocytosis.  Repeat blood cultures remain negative and patient does not required any antibiotics.  He was found to have severe vitamin D deficiency with vitamin D levels were undetectable. He was started on vitamin B12 supplement IM and p.o., should received 7 consecutive doses of IM followed by weekly and his PCP should be able to monitor and titrate accordingly. Thiamine levels were also sent and he was started on a supplement-pending results.  Patient was markedly  hypertensive and acute metabolic encephalopathy was thought to be secondary to hypertensive urgency.  Apparently his home dose of Zestoretic was recently discontinued, he was advised to restart Zestoretic and follow-up with his primary care provider for further recommendations.  Patient will continue his treatment for his metastatic prostate cancer.  He will continue rest of his home medications and follow-up with her his providers.  Discharge Diagnoses:  Principal Problem:   Acute CVA (cerebrovascular accident) [facility]) Active Problems:   Prostate cancer metastatic to multiple sites [facility])   Carboxyhemoglobinemia   Acute confusion   Diabetes mellitus without complication ([place])   HTN (hypertension)   Palliative care encounter  Discharge Instructions  Discharge Instructions    Diet - low sodium heart healthy   Complete by: As directed    Discharge instructions   Complete by: As directed    It was pleasure taking care of you. Please continue taking your blood pressure medication as high blood pressure caused this admission. Your Vit B12 levels were very low and that can cause worsening of your neuropathy and balance issues, please take injections daily for next 5 more days, can have a break over weekend and also continue taking oral supplement. Your primary doctor can help with those injections. Follow up closely with your doctors.   Increase activity slowly   Complete by: As directed      Allergies as of 05/29/2020   No Known Allergies     Medication List    TAKE these medications   abiraterone acetate 250 MG tablet Commonly known as: ZYTIGA Take 4 tablets (1,000 mg total) by mouth daily. Take on an empty stomach 1 hour before or 2 hours after a meal   acetaminophen 650 MG CR tablet Commonly known as: TYLENOL  Take 650 mg by mouth every 8 (eight) hours as needed for pain.   albuterol 108 (90 Base) MCG/ACT inhaler Commonly known as: VENTOLIN HFA Inhale 2 puffs into the lungs  every 6 (six) hours as needed for wheezing or shortness of breath.   aspirin 81 MG tablet Take 81 mg by mouth daily.   cyanocobalamin 1000 MCG/ML injection Commonly known as: (VITAMIN B-12) Inject 1 mL (1,000 mcg total) into the muscle daily for 5 days.   cyanocobalamin 2000 MCG tablet Take 1 tablet (2,000 mcg total) by mouth daily.   finasteride 5 MG tablet Commonly known as: PROSCAR TAKE 1 TABLET BY MOUTH EVERY DAY   glipiZIDE 10 MG 24 hr tablet Commonly known as: GLUCOTROL XL Take 10 mg by mouth daily with breakfast.   lisinopril-hydrochlorothiazide 10-12.5 MG tablet Commonly known as: ZESTORETIC Take 1 tablet by mouth daily.   metFORMIN 1000 MG tablet Commonly known as: GLUCOPHAGE Take 1,000 mg by mouth 2 (two) times daily with a meal.   potassium chloride SA 20 MEQ tablet Commonly known as: KLOR-CON 1 pill twice a day What changed:   how much to take  how to take this  when to take this   predniSONE 5 MG tablet Commonly known as: DELTASONE Take 1 tablet (5 mg total) by mouth daily with breakfast.   simvastatin 40 MG tablet Commonly known as: ZOCOR Take 40 mg by mouth daily.   thiamine 100 MG tablet Take 1 tablet (100 mg total) by mouth daily.   Tradjenta 5 MG Tabs tablet Generic drug: linagliptin Take 5 mg by mouth daily.   traMADol 50 MG tablet Commonly known as: ULTRAM TAKE 1 TABLET (50 MG TOTAL) BY MOUTH EVERY 6 (SIX) HOURS AS NEEDED FOR MODERATE PAIN OR SEVERE PAIN       Follow-up Information    Aycock, Ngwe A, MD. Schedule an appointment as soon as possible for a visit.   Specialty: Family Medicine Contact information: [place] [address] [phone]              No Known Allergies  Consultations:  Neurology  Procedures/Studies: DG Chest 2 View  Result Date: 05/27/2020 CLINICAL DATA:  80 year old male with altered mental status. EXAM: CHEST - 2 VIEW COMPARISON:  Chest radiograph dated 01/16/2020.  FINDINGS: Left lung base linear atelectasis/scarring. No focal consolidation, pleural effusion, pneumothorax. Mild cardiomegaly. Atherosclerotic calcification of the aortic arch. No acute osseous pathology. IMPRESSION: No active cardiopulmonary disease. Electronically Signed   By: Anner Crete M.D.   On: 05/27/2020 02:46   CT HEAD WO CONTRAST  Result Date: 05/27/2020 CLINICAL DATA:  Right-sided headache with left-sided defects EXAM: CT HEAD WITHOUT CONTRAST TECHNIQUE: Contiguous axial images were obtained from the base of the skull through the vertex without intravenous contrast. COMPARISON:  PET-CT 01/24/2020 FINDINGS: Brain: No evidence of acute infarction, hemorrhage, hydrocephalus, extra-axial collection, visible mass lesion or mass effect. Diffuse prominence of the ventricles, cisterns and sulci compatible with moderate parenchymal volume loss. Incidental note made of a cavum septum pellucidum et vergae. Patchy areas of white matter hypoattenuation are most compatible with chronic microvascular angiopathy. Benign dural calcifications. Vascular: Atherosclerotic calcification of the carotid siphons and intradural vertebral arteries. No hyperdense vessel. Skull: No calvarial fracture. No scalp swelling or hematoma. Few sclerotic foci seen in the colitis and left occipital condyle as well as the anterior right parietal bone and occipital bone likely reflecting multifocal osseous metastatic disease in the setting of prostate carcinoma. Sinuses/Orbits:  Paranasal sinuses and mastoid air cells are predominantly clear. Orbital structures are unremarkable aside from prior lens extractions. Other: None. IMPRESSION: 1. No acute intracranial findings. 2. Moderate parenchymal volume loss and chronic microvascular angiopathy. 3. Numerous sclerotic foci throughout the calvarium and skull base concerning for osseous metastases in the setting of prostate carcinoma. Electronically Signed   By: Lovena Le M.D.   On:  05/27/2020 01:47   MR Brain W and Wo Contrast  Result Date: 05/27/2020 CLINICAL DATA:  Mental status change. Prostate cancer with bone metastases. Diabetes and hypertension. EXAM: MRI HEAD WITHOUT AND WITH CONTRAST TECHNIQUE: Multiplanar, multiecho pulse sequences of the brain and surrounding structures were obtained without and with intravenous contrast. CONTRAST:  7.63mL GADAVIST GADOBUTROL 1 MMOL/ML IV SOLN COMPARISON:  CT head without contrast 05/27/2020 FINDINGS: Brain: No acute infarct, hemorrhage, or mass lesion is present. Mild atrophy and moderate diffuse white matter disease is present. Focal cortical T2 hyperintensities in the high parietal lobe and posterior right middle frontal gyrus are compatible with small remote cortical infarcts. Asymmetric cortical thinning, encephalomalacia, and volume loss is compatible with a remote right occipital lobe infarct. Remote left occipital lobe infarcts noted without volume loss. Cavum septum pellucidum incidentally noted. The internal auditory canals are within normal limits. The brainstem and cerebellum are within normal limits. Postcontrast images demonstrate no pathologic enhancement. The coronal postcontrast images are severely degraded by patient motion. Vascular: Flow is present in the major intracranial arteries. Skull and upper cervical spine: Adenoid hypertrophy noted. No discrete lesion present. Sclerotic skull lesions consistent with known metastases. Sinuses/Orbits: Polyp or mucous retention cyst is present the right maxillary sinus. The paranasal sinuses and mastoid air cells are otherwise clear. Bilateral lens replacements are noted. Globes and orbits are otherwise unremarkable. Bilateral exophthalmos noted. No orbital mass present. IMPRESSION: 1. No acute intracranial abnormality. 2. Mild atrophy and moderate diffuse white matter disease likely reflects the sequela of chronic microvascular ischemia. 3. Remote cortical infarcts involving the high  right parietal lobe and posterior right middle frontal gyrus. 4. Remote occipital lobe infarcts bilaterally. Volume loss is more noted on the right. 5. Sclerotic areas in the calvarium consistent with metastatic disease. Electronically Signed   By: San Morelle M.D.   On: 05/27/2020 11:39   ECHOCARDIOGRAM COMPLETE  Result Date: 05/27/2020    ECHOCARDIOGRAM REPORT   Patient Name:   KAIMANA LURZ Date of Exam: 05/27/2020 Medical Rec #:  [id number]      Height:       72.0 in Accession #:    [id number]     Weight:       209.0 lb Date of Birth:  Jul 17, 1940      BSA:          2.171 m Patient Age:    80 years       BP:           153/77 mmHg Patient Gender: M              HR:           87 bpm. Exam Location:  [facility] Procedure: 2D Echo, Color Doppler, Cardiac Doppler and Strain Analysis Indications:     Stroke I63.9  History:         Patient has no prior history of Echocardiogram examinations.                  Risk Factors:Hypertension and Diabetes.  Sonographer:     Sherrie Sport RDCS (AE) Referring  Phys:  [id number] Athena Masse Diagnosing Phys: Bartholome Bill MD  Sonographer Comments: Global longitudinal strain was attempted. IMPRESSIONS  1. Left ventricular ejection fraction, by estimation, is 65 to 70%. The left ventricle has normal function. The left ventricle has no regional wall motion abnormalities. There is mild left ventricular hypertrophy. Left ventricular diastolic parameters are consistent with Grade I diastolic dysfunction (impaired relaxation).  2. Right ventricular systolic function is normal. The right ventricular size is normal.  3. The mitral valve is grossly normal. Trivial mitral valve regurgitation.  4. The aortic valve is tricuspid. Aortic valve regurgitation is not visualized. FINDINGS  Left Ventricle: Left ventricular ejection fraction, by estimation, is 65 to 70%. The left ventricle has normal function. The left ventricle has no regional wall motion abnormalities. The left ventricular internal  cavity size was normal in size. There is  mild left ventricular hypertrophy. Left ventricular diastolic parameters are consistent with Grade I diastolic dysfunction (impaired relaxation). Right Ventricle: The right ventricular size is normal. No increase in right ventricular wall thickness. Right ventricular systolic function is normal. Left Atrium: Left atrial size was normal in size. Right Atrium: Right atrial size was normal in size. Pericardium: There is no evidence of pericardial effusion. Mitral Valve: The mitral valve is grossly normal. Trivial mitral valve regurgitation. Tricuspid Valve: The tricuspid valve is grossly normal. Tricuspid valve regurgitation is mild. Aortic Valve: The aortic valve is tricuspid. Aortic valve regurgitation is not visualized. Aortic valve mean gradient measures 3.0 mmHg. Aortic valve peak gradient measures 4.4 mmHg. Aortic valve area, by VTI measures 3.34 cm. Pulmonic Valve: The pulmonic valve was grossly normal. Pulmonic valve regurgitation is mild. Aorta: The aortic root is normal in size and structure. IAS/Shunts: The interatrial septum was not well visualized.  LEFT VENTRICLE PLAX 2D LVIDd:         4.44 cm  Diastology LVIDs:         2.75 cm  LV e' medial:    4.79 cm/s LV PW:         1.13 cm  LV E/e' medial:  11.4 LV IVS:        1.07 cm  LV e' lateral:   9.36 cm/s LVOT diam:     2.20 cm  LV E/e' lateral: 5.9 LV SV:         70 LV SV Index:   32 LVOT Area:     3.80 cm                          3D Volume EF:                         3D EF:        53 %                         LV EDV:       111 ml                         LV ESV:       52 ml                         LV SV:        59 ml RIGHT VENTRICLE RV Basal diam:  3.72 cm RV S prime:     19.10 cm/s TAPSE (  M-mode): 3.8 cm LEFT ATRIUM           Index       RIGHT ATRIUM           Index LA diam:      3.60 cm 1.66 cm/m  RA Area:     23.20 cm LA Vol (A2C): 47.8 ml 22.02 ml/m RA Volume:   77.20 ml  35.56 ml/m LA Vol (A4C): 49.7 ml  22.89 ml/m  AORTIC VALVE                   PULMONIC VALVE AV Area (Vmax):    3.31 cm    PV Vmax:        0.90 m/s AV Area (Vmean):   3.32 cm    PV Peak grad:   3.2 mmHg AV Area (VTI):     3.34 cm    RVOT Peak grad: 6 mmHg AV Vmax:           105.00 cm/s AV Vmean:          75.000 cm/s AV VTI:            0.208 m AV Peak Grad:      4.4 mmHg AV Mean Grad:      3.0 mmHg LVOT Vmax:         91.40 cm/s LVOT Vmean:        65.500 cm/s LVOT VTI:          0.183 m LVOT/AV VTI ratio: 0.88  AORTA Ao Root diam: 3.35 cm MITRAL VALVE               TRICUSPID VALVE MV Area (PHT): 6.90 cm    TR Peak grad:   9.1 mmHg MV Decel Time: 110 msec    TR Vmax:        151.00 cm/s MV E velocity: 54.80 cm/s MV A velocity: 89.10 cm/s  SHUNTS MV E/A ratio:  0.62        Systemic VTI:  0.18 m                            Systemic Diam: 2.20 cm Bartholome Bill MD Electronically signed by Bartholome Bill MD Signature Date/Time: 05/27/2020/10:38:43 AM    Final      Subjective: Patient was feeling better when seen today.  No new complaint.  Daughter at bedside.  Discharge Exam: Vitals:   05/29/20 0513 05/29/20 0753  BP: (!) 165/82 (!) 166/92  Pulse: 71 67  Resp: 15 16  Temp: 98.4 F (36.9 C) 98 F (36.7 C)  SpO2: 99% 100%   Vitals:   05/28/20 1941 05/28/20 2347 05/29/20 0513 05/29/20 0753  BP: (!) 161/99 (!) 162/85 (!) 165/82 (!) 166/92  Pulse: 72 72 71 67  Resp: 18 19 15 16   Temp: 97.7 F (36.5 C) 98.7 F (37.1 C) 98.4 F (36.9 C) 98 F (36.7 C)  TempSrc: Oral Oral Oral   SpO2: 100% 97% 99% 100%  Weight:      Height:        General: Pt is alert, awake, not in acute distress Cardiovascular: RRR, S1/S2 +, no rubs, no gallops Respiratory: CTA bilaterally, no wheezing, no rhonchi Abdominal: Soft, NT, ND, bowel sounds + Extremities: no edema, no cyanosis   The results of significant diagnostics from this hospitalization (including imaging, microbiology, ancillary and laboratory) are listed below for reference.     Microbiology: Recent Results (from  the past 240 hour(s))  Resp Panel by RT-PCR (Flu A&B, Covid) Nasopharyngeal Swab     Status: None   Collection Time: 05/27/20  1:22 AM   Specimen: Nasopharyngeal Swab; Nasopharyngeal(NP) swabs in vial transport medium  Result Value Ref Range Status   SARS Coronavirus 2 by RT PCR NEGATIVE NEGATIVE Final    Comment: (NOTE) SARS-CoV-2 target nucleic acids are NOT DETECTED.  The SARS-CoV-2 RNA is generally detectable in upper respiratory specimens during the acute phase of infection. The lowest concentration of SARS-CoV-2 viral copies this assay can detect is 138 copies/mL. A negative result does not preclude SARS-Cov-2 infection and should not be used as the sole basis for treatment or other patient management decisions. A negative result may occur with  improper specimen collection/handling, submission of specimen other than nasopharyngeal swab, presence of viral mutation(s) within the areas targeted by this assay, and inadequate number of viral copies(<138 copies/mL). A negative result must be combined with clinical observations, patient history, and epidemiological information. The expected result is Negative.  Fact Sheet for Patients:  EntrepreneurPulse.com.au  Fact Sheet for Healthcare Providers:  IncredibleEmployment.be  This test is no t yet approved or cleared by the [place] FDA and  has been authorized for detection and/or diagnosis of SARS-CoV-2 by FDA under an Emergency Use Authorization (EUA). This EUA will remain  in effect (meaning this test can be used) for the duration of the COVID-19 declaration under Section 564(b)(1) of the Act, 21 U.S.C.section 360bbb-3(b)(1), unless the authorization is terminated  or revoked sooner.       Influenza A by PCR NEGATIVE NEGATIVE Final   Influenza B by PCR NEGATIVE NEGATIVE Final    Comment: (NOTE) The Xpert Xpress SARS-CoV-2/FLU/RSV plus assay is  intended as an aid in the diagnosis of influenza from Nasopharyngeal swab specimens and should not be used as a sole basis for treatment. Nasal washings and aspirates are unacceptable for Xpert Xpress SARS-CoV-2/FLU/RSV testing.  Fact Sheet for Patients: EntrepreneurPulse.com.au  Fact Sheet for Healthcare Providers: IncredibleEmployment.be  This test is not yet approved or cleared by the [place] FDA and has been authorized for detection and/or diagnosis of SARS-CoV-2 by FDA under an Emergency Use Authorization (EUA). This EUA will remain in effect (meaning this test can be used) for the duration of the COVID-19 declaration under Section 564(b)(1) of the Act, 21 U.S.C. section 360bbb-3(b)(1), unless the authorization is terminated or revoked.  Performed at [facility], [address] [place], [place] [address]   Culture, blood (Routine X 2) w Reflex to ID Panel     Status: None (Preliminary result)   Collection Time: 05/27/20  4:33 AM   Specimen: BLOOD  Result Value Ref Range Status   Specimen Description   Final    BLOOD LEFT HAND Performed at [facility], [address] [place], [place] [address]    Special Requests   Final    BOTTLES DRAWN AEROBIC ONLY Blood Culture adequate volume Performed at [facility], [address] [place], [place] [address]    Culture  Setup Time   Final    GRAM POSITIVE COCCI AEROBIC BOTTLE ONLY CRITICAL VALUE NOTED.  VALUE IS CONSISTENT WITH PREVIOUSLY REPORTED AND CALLED VALUE. Performed at [facility], [address], [place], [place] [address]    Culture   Final    CULTURE REINCUBATED FOR BETTER GROWTH Performed at [facility], [place] [address] [place], [place] [address]    Report Status PENDING  Incomplete  Culture, blood (Routine X 2) w Reflex to ID Panel     Status: Abnormal (Preliminary result)   Collection Time: 05/27/20  4:34 AM    Specimen: BLOOD  Result Value Ref Range Status   Specimen Description   Final    BLOOD LEFT [facility] Performed at [facility], [address] [place], [place] [address]    Special Requests   Final    BOTTLES DRAWN AEROBIC AND ANAEROBIC Blood Culture adequate volume Performed at [facility], [address], [place], [place] [address]    Culture  Setup Time   Final    GRAM POSITIVE COCCI IN BOTH AEROBIC AND ANAEROBIC BOTTLES CRITICAL RESULT CALLED TO, READ BACK BY AND VERIFIED WITH: NATHAN BELUE PHARMD 2344 05/27/20 HNM    Culture (A)  Final    STAPHYLOCOCCUS EPIDERMIDIS CULTURE REINCUBATED FOR BETTER GROWTH Performed at [facility], [place] [address] [place], [place] [address]    Report Status PENDING  Incomplete  Blood Culture ID Panel (Reflexed)     Status: Abnormal   Collection Time: 05/27/20  4:34 AM  Result Value Ref Range Status   Enterococcus faecalis NOT DETECTED NOT DETECTED Final   Enterococcus Faecium NOT DETECTED NOT DETECTED Final   Listeria monocytogenes NOT DETECTED NOT DETECTED Final   Staphylococcus species DETECTED (A) NOT DETECTED Final    Comment: CRITICAL RESULT CALLED TO, READ BACK BY AND VERIFIED WITH: NATHAN BELUE PHARMD 2344 05/27/20 HNM    Staphylococcus aureus (BCID) NOT DETECTED NOT DETECTED Final   Staphylococcus epidermidis DETECTED (A) NOT DETECTED Final    Comment: CRITICAL RESULT CALLED TO, READ BACK BY AND VERIFIED WITH: NATHAN BELUE PHARMD 2344 05/27/20 HNM    Staphylococcus lugdunensis NOT DETECTED NOT DETECTED Final   Streptococcus species NOT DETECTED NOT DETECTED Final   Streptococcus agalactiae NOT DETECTED NOT DETECTED Final   Streptococcus pneumoniae NOT DETECTED NOT DETECTED Final   Streptococcus pyogenes NOT DETECTED NOT DETECTED Final   A.calcoaceticus-baumannii NOT DETECTED NOT DETECTED Final   Bacteroides fragilis NOT DETECTED NOT DETECTED Final   Enterobacterales NOT DETECTED NOT DETECTED Final    Enterobacter cloacae complex NOT DETECTED NOT DETECTED Final   Escherichia coli NOT DETECTED NOT DETECTED Final   Klebsiella aerogenes NOT DETECTED NOT DETECTED Final   Klebsiella oxytoca NOT DETECTED NOT DETECTED Final   Klebsiella pneumoniae NOT DETECTED NOT DETECTED Final   Proteus species NOT DETECTED NOT DETECTED Final   Salmonella species NOT DETECTED NOT DETECTED Final   Serratia marcescens NOT DETECTED NOT DETECTED Final   Haemophilus influenzae NOT DETECTED NOT DETECTED Final   Neisseria meningitidis NOT DETECTED NOT DETECTED Final   Pseudomonas aeruginosa NOT DETECTED NOT DETECTED Final   Stenotrophomonas maltophilia NOT DETECTED NOT DETECTED Final   Candida albicans NOT DETECTED NOT DETECTED Final   Candida auris NOT DETECTED NOT DETECTED Final   Candida glabrata NOT DETECTED NOT DETECTED Final   Candida krusei NOT DETECTED NOT DETECTED Final   Candida parapsilosis NOT DETECTED NOT DETECTED Final   Candida tropicalis NOT DETECTED NOT DETECTED Final   Cryptococcus neoformans/gattii NOT DETECTED NOT DETECTED Final   Methicillin resistance mecA/C NOT DETECTED NOT DETECTED Final    Comment: Performed at [facility], [address] [place], [place] [address]  CULTURE, BLOOD (ROUTINE X 2) w Reflex to ID Panel     Status: None (Preliminary result)   Collection Time: 05/28/20  9:41 AM   Specimen: BLOOD  Result Value Ref Range  Status   Specimen Description BLOOD RIGHT HAND  Final   Special Requests   Final    BOTTLES DRAWN AEROBIC AND ANAEROBIC Blood Culture adequate volume   Culture   Final    NO GROWTH < 24 HOURS Performed at [facility], [address], [place], [place] [address]    Report Status PENDING  Incomplete  CULTURE, BLOOD (ROUTINE X 2) w Reflex to ID Panel     Status: None (Preliminary result)   Collection Time: 05/28/20  9:41 AM   Specimen: BLOOD  Result Value Ref Range Status   Specimen Description BLOOD RIGHT ARM  Final   Special Requests    Final    BOTTLES DRAWN AEROBIC AND ANAEROBIC Blood Culture results may not be optimal due to an inadequate volume of blood received in culture bottles   Culture   Final    NO GROWTH < 24 HOURS Performed at [facility], [address] [place], [place] [address]    Report Status PENDING  Incomplete     Labs: BNP (last 3 results) No results for input(s): BNP in the last 8760 hours. Basic Metabolic Panel: Recent Labs  Lab 05/27/20 0119 05/28/20 0553  NA 138 140  K 3.6 3.4*  CL 104 106  CO2 23 24  GLUCOSE 184* 70  BUN 13 12  CREATININE 1.09 1.03  CALCIUM 9.8 9.5  MG  --  1.7   Liver Function Tests: Recent Labs  Lab 05/27/20 0119  AST 14*  ALT 10  ALKPHOS 122  BILITOT 0.6  PROT 7.5  ALBUMIN 4.0   No results for input(s): LIPASE, AMYLASE in the last 168 hours. No results for input(s): AMMONIA in the last 168 hours. CBC: Recent Labs  Lab 05/27/20 0119 05/28/20 0725  WBC 13.8* 12.0*  NEUTROABS 9.9* 6.9  HGB 12.5* 11.8*  HCT 41.0 35.7*  MCV 94.9 89.0  PLT 339 298   Cardiac Enzymes: No results for input(s): CKTOTAL, CKMB, CKMBINDEX, TROPONINI in the last 168 hours. BNP: Invalid input(s): POCBNP CBG: Recent Labs  Lab 05/28/20 0803 05/28/20 1139 05/28/20 1646 05/28/20 2046 05/29/20 0753  GLUCAP 87 174* 126* 185* 127*   D-Dimer No results for input(s): DDIMER in the last 72 hours. Hgb A1c No results for input(s): HGBA1C in the last 72 hours. Lipid Profile Recent Labs    05/27/20 0434  CHOL 215*  HDL 60  LDLCALC 127*  TRIG 138  CHOLHDL 3.6   Thyroid function studies Recent Labs    05/27/20 0437  TSH 0.383   Anemia work up Recent Labs    05/27/20 0837  VITAMINB12 <50*   Urinalysis    Component Value Date/Time   COLORURINE YELLOW (A) 05/27/2020 0219   APPEARANCEUR CLEAR (A) 05/27/2020 0219   LABSPEC 1.013 05/27/2020 0219   PHURINE 6.0 05/27/2020 0219   GLUCOSEU 50 (A) 05/27/2020 0219   HGBUR SMALL (A) 05/27/2020 0219    BILIRUBINUR NEGATIVE 05/27/2020 0219   KETONESUR 5 (A) 05/27/2020 0219   PROTEINUR NEGATIVE 05/27/2020 0219   NITRITE NEGATIVE 05/27/2020 0219   LEUKOCYTESUR NEGATIVE 05/27/2020 0219   Sepsis Labs Invalid input(s): PROCALCITONIN,  WBC,  LACTICIDVEN Microbiology Recent Results (from the past 240 hour(s))  Resp Panel by RT-PCR (Flu A&B, Covid) Nasopharyngeal Swab     Status: None   Collection Time: 05/27/20  1:22 AM   Specimen: Nasopharyngeal Swab; Nasopharyngeal(NP) swabs in vial transport medium  Result Value Ref Range Status   SARS Coronavirus 2 by RT  PCR NEGATIVE NEGATIVE Final    Comment: (NOTE) SARS-CoV-2 target nucleic acids are NOT DETECTED.  The SARS-CoV-2 RNA is generally detectable in upper respiratory specimens during the acute phase of infection. The lowest concentration of SARS-CoV-2 viral copies this assay can detect is 138 copies/mL. A negative result does not preclude SARS-Cov-2 infection and should not be used as the sole basis for treatment or other patient management decisions. A negative result may occur with  improper specimen collection/handling, submission of specimen other than nasopharyngeal swab, presence of viral mutation(s) within the areas targeted by this assay, and inadequate number of viral copies(<138 copies/mL). A negative result must be combined with clinical observations, patient history, and epidemiological information. The expected result is Negative.  Fact Sheet for Patients:  EntrepreneurPulse.com.au  Fact Sheet for Healthcare Providers:  IncredibleEmployment.be  This test is no t yet approved or cleared by the [place] FDA and  has been authorized for detection and/or diagnosis of SARS-CoV-2 by FDA under an Emergency Use Authorization (EUA). This EUA will remain  in effect (meaning this test can be used) for the duration of the COVID-19 declaration under Section 564(b)(1) of the Act,  21 U.S.C.section 360bbb-3(b)(1), unless the authorization is terminated  or revoked sooner.       Influenza A by PCR NEGATIVE NEGATIVE Final   Influenza B by PCR NEGATIVE NEGATIVE Final    Comment: (NOTE) The Xpert Xpress SARS-CoV-2/FLU/RSV plus assay is intended as an aid in the diagnosis of influenza from Nasopharyngeal swab specimens and should not be used as a sole basis for treatment. Nasal washings and aspirates are unacceptable for Xpert Xpress SARS-CoV-2/FLU/RSV testing.  Fact Sheet for Patients: EntrepreneurPulse.com.au  Fact Sheet for Healthcare Providers: IncredibleEmployment.be  This test is not yet approved or cleared by the [place] FDA and has been authorized for detection and/or diagnosis of SARS-CoV-2 by FDA under an Emergency Use Authorization (EUA). This EUA will remain in effect (meaning this test can be used) for the duration of the COVID-19 declaration under Section 564(b)(1) of the Act, 21 U.S.C. section 360bbb-3(b)(1), unless the authorization is terminated or revoked.  Performed at [facility], [address] [place], [place] [address]   Culture, blood (Routine X 2) w Reflex to ID Panel     Status: None (Preliminary result)   Collection Time: 05/27/20  4:33 AM   Specimen: BLOOD  Result Value Ref Range Status   Specimen Description   Final    BLOOD LEFT HAND Performed at [facility], [address], [place], [place] [address]    Special Requests   Final    BOTTLES DRAWN AEROBIC ONLY Blood Culture adequate volume Performed at [facility], [address], [place], [place] [address]    Culture  Setup Time   Final    GRAM POSITIVE COCCI AEROBIC BOTTLE ONLY CRITICAL VALUE NOTED.  VALUE IS CONSISTENT WITH PREVIOUSLY REPORTED AND CALLED VALUE. Performed at [facility], [address] [place], [place] [address]    Culture   Final    CULTURE REINCUBATED FOR BETTER  GROWTH Performed at [facility], [place] [address], [place], [place] [address]    Report Status PENDING  Incomplete  Culture, blood (Routine X 2) w Reflex to ID Panel     Status: Abnormal (Preliminary result)   Collection Time: 05/27/20  4:34 AM   Specimen: BLOOD  Result Value Ref Range Status   Specimen Description   Final    BLOOD LEFT  [facility] Performed at [facility], [address] [place], [place] [address]    Special Requests   Final    BOTTLES DRAWN AEROBIC AND ANAEROBIC Blood Culture adequate volume Performed at [facility], [address] [place], [place] [address]    Culture  Setup Time   Final    GRAM POSITIVE COCCI IN BOTH AEROBIC AND ANAEROBIC BOTTLES CRITICAL RESULT CALLED TO, READ BACK BY AND VERIFIED WITH: Lloyd Huger PHARMD 2344 05/27/20 HNM    Culture (A)  Final    STAPHYLOCOCCUS EPIDERMIDIS CULTURE REINCUBATED FOR BETTER GROWTH Performed at [facility], [place] [address] [place], [place] [address]    Report Status PENDING  Incomplete  Blood Culture ID Panel (Reflexed)     Status: Abnormal   Collection Time: 05/27/20  4:34 AM  Result Value Ref Range Status   Enterococcus faecalis NOT DETECTED NOT DETECTED Final   Enterococcus Faecium NOT DETECTED NOT DETECTED Final   Listeria monocytogenes NOT DETECTED NOT DETECTED Final   Staphylococcus species DETECTED (A) NOT DETECTED Final    Comment: CRITICAL RESULT CALLED TO, READ BACK BY AND VERIFIED WITH: NATHAN BELUE PHARMD 2344 05/27/20 HNM    Staphylococcus aureus (BCID) NOT DETECTED NOT DETECTED Final   Staphylococcus epidermidis DETECTED (A) NOT DETECTED Final    Comment: CRITICAL RESULT CALLED TO, READ BACK BY AND VERIFIED WITH: NATHAN BELUE PHARMD 2344 05/27/20 HNM    Staphylococcus lugdunensis NOT DETECTED NOT DETECTED Final   Streptococcus species NOT DETECTED NOT DETECTED Final   Streptococcus agalactiae NOT DETECTED NOT DETECTED Final   Streptococcus pneumoniae NOT DETECTED  NOT DETECTED Final   Streptococcus pyogenes NOT DETECTED NOT DETECTED Final   A.calcoaceticus-baumannii NOT DETECTED NOT DETECTED Final   Bacteroides fragilis NOT DETECTED NOT DETECTED Final   Enterobacterales NOT DETECTED NOT DETECTED Final   Enterobacter cloacae complex NOT DETECTED NOT DETECTED Final   Escherichia coli NOT DETECTED NOT DETECTED Final   Klebsiella aerogenes NOT DETECTED NOT DETECTED Final   Klebsiella oxytoca NOT DETECTED NOT DETECTED Final   Klebsiella pneumoniae NOT DETECTED NOT DETECTED Final   Proteus species NOT DETECTED NOT DETECTED Final   Salmonella species NOT DETECTED NOT DETECTED Final   Serratia marcescens NOT DETECTED NOT DETECTED Final   Haemophilus influenzae NOT DETECTED NOT DETECTED Final   Neisseria meningitidis NOT DETECTED NOT DETECTED Final   Pseudomonas aeruginosa NOT DETECTED NOT DETECTED Final   Stenotrophomonas maltophilia NOT DETECTED NOT DETECTED Final   Candida albicans NOT DETECTED NOT DETECTED Final   Candida auris NOT DETECTED NOT DETECTED Final   Candida glabrata NOT DETECTED NOT DETECTED Final   Candida krusei NOT DETECTED NOT DETECTED Final   Candida parapsilosis NOT DETECTED NOT DETECTED Final   Candida tropicalis NOT DETECTED NOT DETECTED Final   Cryptococcus neoformans/gattii NOT DETECTED NOT DETECTED Final   Methicillin resistance mecA/C NOT DETECTED NOT DETECTED Final    Comment: Performed at [facility], [address] [place], [place] [address]  CULTURE, BLOOD (ROUTINE X 2) w Reflex to ID Panel     Status: None (Preliminary result)   Collection Time: 05/28/20  9:41 AM   Specimen: BLOOD  Result Value Ref Range Status   Specimen Description BLOOD RIGHT HAND  Final   Special Requests   Final    BOTTLES DRAWN AEROBIC AND ANAEROBIC Blood Culture adequate volume   Culture   Final    NO GROWTH < 24 HOURS Performed at [facility], [address] [place], [place] [address]    Report Status PENDING   Incomplete  CULTURE, BLOOD (ROUTINE X 2) w Reflex to ID Panel     Status: None (Preliminary result)   Collection Time: 05/28/20  9:41 AM   Specimen: BLOOD  Result Value Ref Range Status   Specimen Description BLOOD RIGHT ARM  Final   Special Requests   Final    BOTTLES DRAWN AEROBIC AND ANAEROBIC Blood Culture results may not be optimal due to an inadequate volume of blood received in culture bottles   Culture   Final    NO GROWTH < 24 HOURS Performed at [facility], [address] [place], [place] [address]    Report Status PENDING  Incomplete    Time coordinating discharge: Over 30 minutes  SIGNED:  Lorella Nimrod, MD  [facility] 05/29/2020, 10:09 AM  If 7PM-7AM, please contact night-coverage [email or web address]  This record has been created using Systems analyst. Errors have been sought and corrected,but may not always be located. Such creation errors do not reflect on the standard of care.

## 2020-05-29 NOTE — Progress Notes (Signed)
SLP Cancellation Note  Patient Details Name: Brent Glass MRN: [id number] DOB: 03-26-1940   Cancelled treatment:       Reason Eval/Treat Not Completed: SLP screened, no needs identified, will sign off. Imaging negative for acute CVA; SLP spoke with pt and daughter at bedside who report pt is at baseline. Pt oriented and conversing appropriately with awareness/recall of details pertinent to situation. Pt/daughter in agreement to defer cognitive assessment at this time.  Deneise Lever, [place], CCC-SLP Speech-Language Pathologist  Aliene Altes 05/29/2020, 8:31 AM

## 2020-05-29 NOTE — TOC Transition Note (Signed)
Transition of Care [facility]) - CM/SW Discharge Note   Patient Details  Name: Brent Glass MRN: [id number] Date of Birth: 13-Sep-1940  Transition of [facility]) CM/SW Contact:  Candie Chroman, LCSW Phone Number: 05/29/2020, 11:07 AM   Clinical Narrative:   CSW met with patient and daughter at bedside to discuss PT recommendations. Patient is agreeable to outpatient PT. Preference for [facility]. MD will sign order form when able and CSW will fax to office. Daughter brought cane from home. No further concerns. CSW signing off.  Final next level of care: OP Rehab Barriers to Discharge: Barriers Resolved   Patient Goals and CMS Choice Patient states their goals for this hospitalization and ongoing recovery are:: To return home says daughter.   Choice offered to / list presented to : [place]  Discharge Placement                    Patient and family notified of of transfer: 05/29/20  Discharge Plan and Services In-house Referral: Clinical Social Work Discharge Planning Services: NA Post Acute Care Choice: NA          DME Arranged: N/A DME Agency: NA       HH Arranged: NA HH Agency: NA        Social Determinants of Health (SDOH) Interventions     Readmission Risk Interventions No flowsheet data found.

## 2020-05-30 LAB — CULTURE, BLOOD (ROUTINE X 2): Special Requests: ADEQUATE

## 2020-05-31 ENCOUNTER — Observation Stay
Admission: EM | Admit: 2020-05-31 | Discharge: 2020-06-01 | Disposition: A | Discharge status: Home / Self Care | Payer: Medicare Other | Attending: Emergency Medicine | Admitting: Emergency Medicine

## 2020-05-31 ENCOUNTER — Emergency Department: Payer: Medicare Other

## 2020-05-31 ENCOUNTER — Other Ambulatory Visit: Payer: Self-pay

## 2020-05-31 ENCOUNTER — Other Ambulatory Visit: Payer: Self-pay | Admitting: Internal Medicine

## 2020-05-31 DIAGNOSIS — Z807 Family history of other malignant neoplasms of lymphoid, hematopoietic and related tissues: Secondary | ICD-10-CM

## 2020-05-31 DIAGNOSIS — C61 Malignant neoplasm of prostate: Secondary | ICD-10-CM | POA: Insufficient documentation

## 2020-05-31 DIAGNOSIS — C7800 Secondary malignant neoplasm of unspecified lung: Secondary | ICD-10-CM | POA: Insufficient documentation

## 2020-05-31 DIAGNOSIS — E119 Type 2 diabetes mellitus without complications: Secondary | ICD-10-CM

## 2020-05-31 DIAGNOSIS — Z7982 Long term (current) use of aspirin: Secondary | ICD-10-CM | POA: Insufficient documentation

## 2020-05-31 DIAGNOSIS — R410 Disorientation, unspecified: Secondary | ICD-10-CM | POA: Insufficient documentation

## 2020-05-31 DIAGNOSIS — Z7984 Long term (current) use of oral hypoglycemic drugs: Secondary | ICD-10-CM | POA: Diagnosis not present

## 2020-05-31 DIAGNOSIS — C7951 Secondary malignant neoplasm of bone: Secondary | ICD-10-CM | POA: Diagnosis not present

## 2020-05-31 DIAGNOSIS — E11649 Type 2 diabetes mellitus with hypoglycemia without coma: Secondary | ICD-10-CM | POA: Diagnosis not present

## 2020-05-31 DIAGNOSIS — Z79899 Other long term (current) drug therapy: Secondary | ICD-10-CM | POA: Diagnosis not present

## 2020-05-31 DIAGNOSIS — E162 Hypoglycemia, unspecified: Secondary | ICD-10-CM

## 2020-05-31 DIAGNOSIS — F17210 Nicotine dependence, cigarettes, uncomplicated: Secondary | ICD-10-CM | POA: Insufficient documentation

## 2020-05-31 DIAGNOSIS — I10 Essential (primary) hypertension: Secondary | ICD-10-CM | POA: Diagnosis not present

## 2020-05-31 DIAGNOSIS — T5891XA Toxic effect of carbon monoxide from unspecified source, accidental (unintentional), initial encounter: Secondary | ICD-10-CM

## 2020-05-31 DIAGNOSIS — R972 Elevated prostate specific antigen [PSA]: Secondary | ICD-10-CM | POA: Diagnosis present

## 2020-05-31 DIAGNOSIS — R918 Other nonspecific abnormal finding of lung field: Secondary | ICD-10-CM | POA: Diagnosis present

## 2020-05-31 DIAGNOSIS — R531 Weakness: Secondary | ICD-10-CM

## 2020-05-31 DIAGNOSIS — Z20822 Contact with and (suspected) exposure to covid-19: Secondary | ICD-10-CM | POA: Insufficient documentation

## 2020-05-31 DIAGNOSIS — N138 Other obstructive and reflux uropathy: Secondary | ICD-10-CM | POA: Diagnosis present

## 2020-05-31 DIAGNOSIS — Z801 Family history of malignant neoplasm of trachea, bronchus and lung: Secondary | ICD-10-CM

## 2020-05-31 LAB — CBC
HCT: 36.5 % — ABNORMAL LOW (ref 39.0–52.0)
Hemoglobin: 12 g/dL — ABNORMAL LOW (ref 13.0–17.0)
MCH: 29.2 pg (ref 26.0–34.0)
MCHC: 32.9 g/dL (ref 30.0–36.0)
MCV: 88.8 fL (ref 80.0–100.0)
Platelets: 325 10*3/uL (ref 150–400)
RBC: 4.11 MIL/uL — ABNORMAL LOW (ref 4.22–5.81)
RDW: 13.8 % (ref 11.5–15.5)
WBC: 10.9 10*3/uL — ABNORMAL HIGH (ref 4.0–10.5)
nRBC: 0 % (ref 0.0–0.2)

## 2020-05-31 LAB — URINALYSIS, COMPLETE (UACMP) WITH MICROSCOPIC
Bacteria, UA: NONE SEEN
Bilirubin Urine: NEGATIVE
Glucose, UA: NEGATIVE mg/dL
Ketones, ur: NEGATIVE mg/dL
Leukocytes,Ua: NEGATIVE
Nitrite: NEGATIVE
Protein, ur: NEGATIVE mg/dL
Specific Gravity, Urine: 1.014 (ref 1.005–1.030)
Squamous Epithelial / HPF: NONE SEEN (ref 0–5)
pH: 5 (ref 5.0–8.0)

## 2020-05-31 LAB — TSH: TSH: 0.353 u[IU]/mL (ref 0.350–4.500)

## 2020-05-31 LAB — URINE DRUG SCREEN, QUALITATIVE (ARMC ONLY)
Amphetamines, Ur Screen: NOT DETECTED
Barbiturates, Ur Screen: NOT DETECTED
Benzodiazepine, Ur Scrn: NOT DETECTED
Cannabinoid 50 Ng, Ur ~~LOC~~: NOT DETECTED
Cocaine Metabolite,Ur ~~LOC~~: NOT DETECTED
MDMA (Ecstasy)Ur Screen: NOT DETECTED
Methadone Scn, Ur: NOT DETECTED
Opiate, Ur Screen: NOT DETECTED
Phencyclidine (PCP) Ur S: NOT DETECTED
Tricyclic, Ur Screen: NOT DETECTED

## 2020-05-31 LAB — COMPREHENSIVE METABOLIC PANEL
ALT: 9 U/L (ref 0–44)
AST: 15 U/L (ref 15–41)
Albumin: 3.4 g/dL — ABNORMAL LOW (ref 3.5–5.0)
Alkaline Phosphatase: 94 U/L (ref 38–126)
Anion gap: 10 (ref 5–15)
BUN: 12 mg/dL (ref 8–23)
CO2: 23 mmol/L (ref 22–32)
Calcium: 9.6 mg/dL (ref 8.9–10.3)
Chloride: 103 mmol/L (ref 98–111)
Creatinine, Ser: 1 mg/dL (ref 0.61–1.24)
GFR, Estimated: >60 mL/min (ref >60)
Glucose, Bld: 67 mg/dL — ABNORMAL LOW (ref 70–99)
Potassium: 3.4 mmol/L — ABNORMAL LOW (ref 3.5–5.1)
Sodium: 136 mmol/L (ref 135–145)
Total Bilirubin: 0.5 mg/dL (ref 0.3–1.2)
Total Protein: 6.6 g/dL (ref 6.5–8.1)

## 2020-05-31 LAB — VITAMIN B12: Vitamin B-12: >7500 pg/mL — ABNORMAL HIGH (ref 180–914)

## 2020-05-31 LAB — GLUCOSE, CAPILLARY: Glucose-Capillary: 94 mg/dL (ref 70–99)

## 2020-05-31 LAB — CBG MONITORING, ED
Glucose-Capillary: 34 mg/dL — CL (ref 70–99)
Glucose-Capillary: 41 mg/dL — CL (ref 70–99)
Glucose-Capillary: 103 mg/dL — ABNORMAL HIGH (ref 70–99)

## 2020-05-31 LAB — TROPONIN I (HIGH SENSITIVITY)
Troponin I (High Sensitivity): 14 ng/L (ref <18)
Troponin I (High Sensitivity): 11 ng/L (ref <18)

## 2020-05-31 MED ORDER — POTASSIUM CHLORIDE CRYS ER 20 MEQ PO TBCR
20.0000 meq | EXTENDED_RELEASE_TABLET | Freq: Two times a day (BID) | ORAL | Status: DC
  Administered 2020-05-31 – 2020-06-01 (×2): 20 meq via ORAL
  Filled 2020-05-31 (×2): qty 1

## 2020-05-31 MED ORDER — NICOTINE 21 MG/24HR TD PT24
21.0000 mg | MEDICATED_PATCH | Freq: Every day | TRANSDERMAL | Status: DC
  Administered 2020-06-01: 21 mg via TRANSDERMAL
  Filled 2020-05-31: qty 1

## 2020-05-31 MED ORDER — TRAMADOL HCL 50 MG PO TABS: 50.0000 mg | ORAL_TABLET | Freq: Four times a day (QID) | ORAL | Status: DC | PRN

## 2020-05-31 MED ORDER — ACETAMINOPHEN 650 MG RE SUPP: 325.0000 mg | Freq: Four times a day (QID) | RECTAL | Status: DC | PRN

## 2020-05-31 MED ORDER — DEXTROSE 50 % IV SOLN: 1.0000 | INTRAVENOUS | Status: DC | PRN

## 2020-05-31 MED ORDER — ONDANSETRON HCL 4 MG PO TABS: 4.0000 mg | ORAL_TABLET | Freq: Four times a day (QID) | ORAL | Status: DC | PRN

## 2020-05-31 MED ORDER — ACETAMINOPHEN 325 MG PO TABS: 325.0000 mg | ORAL_TABLET | Freq: Four times a day (QID) | ORAL | Status: DC | PRN

## 2020-05-31 MED ORDER — DEXTROSE IN LACTATED RINGERS 5 % IV SOLN: INTRAVENOUS | Status: DC

## 2020-05-31 MED ORDER — CYANOCOBALAMIN 1000 MCG/ML IJ SOLN
1000.0000 ug | Freq: Every day | INTRAMUSCULAR | Status: DC
  Filled 2020-05-31: qty 1

## 2020-05-31 MED ORDER — VITAMIN B-12 1000 MCG PO TABS
2000.0000 ug | ORAL_TABLET | Freq: Every day | ORAL | Status: DC
  Filled 2020-05-31: qty 2

## 2020-05-31 MED ORDER — ASPIRIN EC 81 MG PO TBEC
81.0000 mg | DELAYED_RELEASE_TABLET | Freq: Every day | ORAL | Status: DC
  Administered 2020-05-31 – 2020-06-01 (×2): 81 mg via ORAL
  Filled 2020-05-31 (×2): qty 1

## 2020-05-31 MED ORDER — ONDANSETRON HCL 4 MG/2ML IJ SOLN: 4.0000 mg | Freq: Four times a day (QID) | INTRAMUSCULAR | Status: DC | PRN

## 2020-05-31 MED ORDER — THIAMINE HCL 100 MG PO TABS
100.0000 mg | ORAL_TABLET | Freq: Every day | ORAL | Status: DC
  Administered 2020-05-31 – 2020-06-01 (×2): 100 mg via ORAL
  Filled 2020-05-31 (×2): qty 1

## 2020-05-31 MED ORDER — CYANOCOBALAMIN 1000 MCG/ML IJ SOLN: 1000.0000 ug | Freq: Every day | INTRAMUSCULAR | Status: DC

## 2020-05-31 MED ORDER — DEXTROSE 50 % IV SOLN
INTRAVENOUS | Status: AC
  Filled 2020-05-31: qty 50

## 2020-05-31 MED ORDER — ENOXAPARIN SODIUM 40 MG/0.4ML ~~LOC~~ SOLN
40.0000 mg | SUBCUTANEOUS | Status: DC
  Administered 2020-05-31: 40 mg via SUBCUTANEOUS
  Filled 2020-05-31: qty 0.4

## 2020-05-31 MED ORDER — INSULIN ASPART 100 UNIT/ML ~~LOC~~ SOLN: 0.0000 [IU] | Freq: Three times a day (TID) | SUBCUTANEOUS | Status: DC

## 2020-05-31 MED ORDER — ALBUTEROL SULFATE HFA 108 (90 BASE) MCG/ACT IN AERS
2.0000 | INHALATION_SPRAY | Freq: Four times a day (QID) | RESPIRATORY_TRACT | Status: DC | PRN
  Filled 2020-05-31: qty 6.7

## 2020-05-31 NOTE — ED Triage Notes (Signed)
Pt presents via POV c/o increased confusion and headache per daughter. Reports pt d/c from hospital on Thursday after stay for B12 deficiency. Pt currently A&Ox4. Drowsy. Daughter reports CBG 52 at home that increased to 50 with food.

## 2020-05-31 NOTE — ED Notes (Signed)
Pt taken to room 10 by Lattie Haw EDT

## 2020-05-31 NOTE — ED Triage Notes (Signed)
First Nurse Note:  Arrives with c/o headache, returning confusion.  Patient's daughter states patient was discharged from the hospital Thursday.

## 2020-05-31 NOTE — ED Notes (Signed)
CBG 34, rechecked 41.  Provider notified.

## 2020-05-31 NOTE — H&P (Addendum)
History and Physical   Brent Glass:[id number] DOB: Aug 05, 1940 DOA: 05/31/2020  PCP: Donnie Coffin, MD  Outpatient Specialists: Dr. Rogue Bussing Patient coming from: home   I have personally briefly reviewed patient's old medical records in [place].  Chief Concern: weakness and low blood sugar  HPI: Brent Glass is a 80 y.o. 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  Daughter at bedside provided the majority of the HPI.  Daughter reports that she was visiting her father in the a.m. when she noticed that he was not himself.  He was weak and lethargic.  She checked his blood sugar and it was in the 50s.  She gave him many sugar-containing foods including a cinnabun and recheck his blood sugar was in the 60s and 70s.  This prompted her to call EMS to take patient to the emergency department for further evaluation.  She denies fever and or vomiting.  At bedside patient was able to tell me his name, his age, the current calendar year after a couple of attempts, and the current president is by needed.  He denies any shortness of breath and chest pain, nausea, vomiting, diarrhea, pain.  Vaccination: Patient is vaccinated for COVID-19 including booster  Social history: Patient lives at home by himself.  Per daughter, she has plans to have him move in with her soon.  Patient is a chain smoker and is currently smoking 2 packs/day.  He does not drink alcohol or use recreational drugs.  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  ROS: Constitutional: no weight change, no fever ENT/Mouth: no sore throat, no rhinorrhea Eyes: no eye pain, no vision  changes Cardiovascular: no chest pain, no dyspnea,  no edema, no palpitations Respiratory: no cough, no sputum, no wheezing Gastrointestinal: no nausea, no vomiting, no diarrhea, no constipation Genitourinary: no urinary incontinence, no dysuria, no hematuria Musculoskeletal: no arthralgias, no myalgias Skin: no skin lesions, no pruritus, Neuro: + weakness, no loss of consciousness, no syncope Psych: no anxiety, no depression, + decrease appetite Heme/Lymph: no bruising, no bleeding  ED Course: Discussed with ED provider, patient requiring hospitalization due to hypoglycemia.  Vitals in the emergency department was remarkable for temperature of 98.2, respiration rate of 21, heart rate of 72, blood pressure 152/59, patient satting at 100% on room air.  Labs in the emergency department was remarkable for serum sodium of 136, potassium 3.4, chloride 103, bicarb 23, BUN 12, serum creatinine of 1.0, BMP nonfasting blood sugar was 67, after dextrose repeat blood sugar was 34, WBC 10.9, hemoglobin 12, platelets 325.  UA in the emergency department was negative for leukocytes and nitrites.  UDS was negative.  Troponin was 14 initially.  ED provider ordered a CT head without contrast and was read as osseous metastasis disease, no acute intracranial abnormality.  Assessment/Plan  Principal Problem:   Hypoglycemia Active Problems:   BPH with obstruction/lower urinary tract symptoms   Elevated PSA   Multiple lung nodules   Prostate cancer metastatic to multiple sites [facility])   Family history of lung cancer   Family history of lymphoma   Carboxyhemoglobinemia   Diabetes mellitus without complication ([place])   HTN (hypertension)   #  Hypoglycemia without coma -secondary to sulfonylurea -Discussed extensively with daughter at bedside -Discontinuation of glipizide for foreseeable future is recommended -I have discontinued glipizide -Holding home Metformin as well -Starting dextrose 5 in lactated  ringer infusion at 125 mL/h for 1 day -As needed dextrose 51 amp IV as needed for low blood sugar, 3 doses ordered -Admit to progressive cardiac observation with telemetry  # B12 deficiency-in a patient with history of Metformin use -B12 deficiency-suspect secondary to Metformin -Resumed B12 injection 1000 mcg IM daily -Checking A1c if less than 8 would recommend discontinuation of both glipizide and metformin for the foreseeable future  # Non-insulin-dependent diabetes mellitus-held Metformin and glipizide as above -Checking A1c -Insulin SSI without at bedtime coverage -Given patient's age and multiple comorbidities and now presentation for hypoglycemia and previous hospitalization for B12 deficiency, would recommend that his goal A1c is less than 8 -Risks of tight glucose control will cause more harm than benefit  # 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 twice daily resumed  Covid test is pending A1c is pending  Chart reviewed.   DVT prophylaxis: Enoxaparin 40 mg subcutaneous every 24 hours, TED hose Code Status: Full code per patient's Diet: Regular diet Family Communication: Updated daughter at bedside Disposition Plan: Pending clinical course Consults called: None at this time Admission status: Progressive cardiac, with telemetry  Past Medical History:  Diagnosis Date  . Arthritis   . BPH (benign prostatic hyperplasia)   . BPH with obstruction/lower urinary tract symptoms   . Diabetes ([place])   . Disorder resulting from impaired renal function   . Elevated PSA   . Family history of lung  cancer   . Family history of lymphoma   . Heart murmur   . Hypertension   . Knee pain   . Lesion of lung   . Low HDL (under 40)   . Obesity   . Urinary hesitancy    Past Surgical History:  Procedure Laterality Date  . HERNIA REPAIR  1607   Umbilical Hernia Repair  . KNEE ARTHROSCOPY Left 04/10/2015   Procedure: ARTHROSCOPY KNEE, partial medial meniscectomy, partial synovectomy;  Surgeon: Hessie Knows, MD;  Location: [facility] ORS;  Service: Orthopedics;  Laterality: Left;   Social History:  reports that he has been smoking cigarettes. He has been smoking about 1.00 pack per day. He has never used smokeless tobacco. He reports that he does not drink alcohol and does not use drugs.  No Known Allergies Family History  Problem Relation Age of Onset  . Diabetes Mother   . Lung cancer Mother   . Lymphoma Son 28       died in 1.   . Kidney disease Neg Hx   . Prostate cancer Neg Hx    Family history: Family history reviewed and not pertinent  Prior to Admission medications   Medication Sig Start Date End Date Taking? Authorizing Provider  abiraterone acetate (ZYTIGA) 250 MG tablet Take 4 tablets (1,000 mg total) by mouth daily. Take on an empty stomach 1 hour before or 2 hours after a meal 02/20/20   Cammie Sickle, MD  acetaminophen (TYLENOL) 650  MG CR tablet Take 650 mg by mouth every 8 (eight) hours as needed for pain.    [provider]  albuterol (PROVENTIL HFA;VENTOLIN HFA) 108 (90 BASE) MCG/ACT inhaler Inhale 2 puffs into the lungs every 6 (six) hours as needed for wheezing or shortness of breath.    [provider]  aspirin 81 MG tablet Take 81 mg by mouth daily.    [provider]  cyanocobalamin (,VITAMIN B-12,) 1000 MCG/ML injection Inject 1 mL (1,000 mcg total) into the muscle daily for 5 days. 05/29/20 06/03/20  Lorella Nimrod, MD  finasteride (PROSCAR) 5 MG tablet TAKE 1 TABLET BY MOUTH EVERY DAY Patient taking differently: Take 5 mg by mouth  daily. 11/09/17   [name] A, PA-C  glipiZIDE (GLUCOTROL XL) 10 MG 24 hr tablet Take 10 mg by mouth daily with breakfast. 11/09/14   [provider]  lisinopril-hydrochlorothiazide (ZESTORETIC) 10-12.5 MG tablet Take 1 tablet by mouth daily. Patient not taking: No sig reported 04/24/20   [provider]  metFORMIN (GLUCOPHAGE) 1000 MG tablet Take 1,000 mg by mouth 2 (two) times daily with a meal.    [provider]  potassium chloride SA (KLOR-CON) 20 MEQ tablet 1 pill twice a day Patient taking differently: Take 20 mEq by mouth 2 (two) times daily. 1 pill twice a day 05/14/20   Cammie Sickle, MD  predniSONE (DELTASONE) 5 MG tablet Take 1 tablet (5 mg total) by mouth daily with breakfast. Patient not taking: Reported on 05/27/2020 03/12/20   Cammie Sickle, MD  simvastatin (ZOCOR) 40 MG tablet Take 40 mg by mouth daily.    [provider]  thiamine 100 MG tablet Take 1 tablet (100 mg total) by mouth daily. 05/29/20   Lorella Nimrod, MD  TRADJENTA 5 MG TABS tablet Take 5 mg by mouth daily. 03/20/20   [provider]  traMADol (ULTRAM) 50 MG tablet TAKE 1 TABLET (50 MG TOTAL) BY MOUTH EVERY 6 (SIX) HOURS AS NEEDED FOR MODERATE PAIN OR SEVERE PAIN 05/06/20   Verlon Au, NP  vitamin B-12 2000 MCG tablet Take 1 tablet (2,000 mcg total) by mouth daily. 05/29/20   Lorella Nimrod, MD   Physical Exam: Vitals:   05/31/20 1430 05/31/20 1500 05/31/20 1630 05/31/20 1700  BP: (!) 172/69 (!) 162/57 126/74 (!) 152/59  Pulse: 65  (!) 52 (!) 52  Resp: 16 20 (!) 21 14  Temp:      TempSrc:      SpO2: 98% 98% 98% 97%   Constitutional: appears age appropriate, NAD, calm, comfortable Eyes: PERRL, lids and conjunctivae normal ENMT: Mucous membranes are moist. Posterior pharynx clear of any exudate or lesions. Age-appropriate dentition. Hearing appropriate Neck: normal, supple, no masses, no thyromegaly Respiratory: clear to auscultation bilaterally, no  wheezing, no crackles. Normal respiratory effort. No accessory muscle use.  Cardiovascular: Regular rate and rhythm, no murmurs / rubs / gallops. No extremity edema. 2+ pedal pulses. No carotid bruits.  Abdomen: no tenderness, no masses palpated, no hepatosplenomegaly. Bowel sounds positive.  Musculoskeletal: no clubbing / cyanosis. No joint deformity upper and lower extremities. Good ROM, no contractures, no atrophy. Normal muscle tone.  Skin: no rashes, lesions, ulcers. No induration Neurologic: Sensation intact. Strength 5/5 in all 4.  Psychiatric: Normal judgment and insight. Alert and oriented x 3. Normal mood.   EKG: independently reviewed, showing sinus rhythm, rate of 64, QTc 438  Chest x-ray on Admission: I personally reviewed and I agree with radiologist  reading as below.  DG Chest 2 View  Result Date: 05/31/2020 CLINICAL DATA:  Weakness. Increased confusion and headache. History of prostate cancer EXAM: CHEST - 2 VIEW COMPARISON:  05/27/2020 FINDINGS: Normal heart size. Aortic atherosclerosis. Decreased lung volumes. No superimposed pleural effusion or interstitial edema. No airspace densities. No acute osseous findings. IMPRESSION: Low lung volumes.  No acute abnormality Electronically Signed   By: Kerby Moors M.D.   On: 05/31/2020 14:36   CT Head Wo Contrast  Result Date: 05/31/2020 CLINICAL DATA:  80 year old male with headache and confusion. History of prostate cancer. EXAM: CT HEAD WITHOUT CONTRAST TECHNIQUE: Contiguous axial images were obtained from the base of the skull through the vertex without intravenous contrast. COMPARISON:  05/27/2020 and prior studies FINDINGS: Brain: No evidence of acute infarction, hemorrhage, hydrocephalus, extra-axial collection or mass lesion/mass effect. Atrophy and mild chronic small-vessel white matter ischemic changes again noted. Vascular: Carotid atherosclerotic calcifications are noted. Skull: Sclerotic lesions within the visualized bony  structures again noted. Sinuses/Orbits: Small amount of fluid within the sphenoid sinus is again noted. Other: None. IMPRESSION: 1. No evidence of acute intracranial abnormality. 2. Atrophy and chronic small-vessel white matter ischemic changes. 3. Osseous metastatic disease again noted. Electronically Signed   By: Margarette Canada M.D.   On: 05/31/2020 14:41   Labs on Admission: I have personally reviewed following labs  CBC: Recent Labs  Lab 05/27/20 0119 05/28/20 0725 05/31/20 1344  WBC 13.8* 12.0* 10.9*  NEUTROABS 9.9* 6.9  --   HGB 12.5* 11.8* 12.0*  HCT 41.0 35.7* 36.5*  MCV 94.9 89.0 88.8  PLT 339 298 703   Basic Metabolic Panel: Recent Labs  Lab 05/27/20 0119 05/28/20 0553 05/31/20 1344  NA 138 140 136  K 3.6 3.4* 3.4*  CL 104 106 103  CO2 23 24 23   GLUCOSE 184* 70 67*  BUN 13 12 12   CREATININE 1.09 1.03 1.00  CALCIUM 9.8 9.5 9.6  MG  --  1.7  --    GFR: Estimated Creatinine Clearance: 64.7 mL/min (by C-G formula based on SCr of 1 mg/dL).  Liver Function Tests: Recent Labs  Lab 05/27/20 0119 05/31/20 1344  AST 14* 15  ALT 10 9  ALKPHOS 122 94  BILITOT 0.6 0.5  PROT 7.5 6.6  ALBUMIN 4.0 3.4*   Coagulation Profile: Recent Labs  Lab 05/27/20 0433  INR 1.0   CBG: Recent Labs  Lab 05/28/20 1139 05/28/20 1646 05/28/20 2046 05/29/20 0753 05/31/20 1707  GLUCAP 174* 126* 185* 127* 34*   Urine analysis:    Component Value Date/Time   COLORURINE YELLOW (A) 05/31/2020 1344   APPEARANCEUR CLEAR (A) 05/31/2020 1344   LABSPEC 1.014 05/31/2020 1344   PHURINE 5.0 05/31/2020 1344   GLUCOSEU NEGATIVE 05/31/2020 1344   HGBUR SMALL (A) 05/31/2020 1344   BILIRUBINUR NEGATIVE 05/31/2020 1344   KETONESUR NEGATIVE 05/31/2020 1344   PROTEINUR NEGATIVE 05/31/2020 1344   NITRITE NEGATIVE 05/31/2020 1344   LEUKOCYTESUR NEGATIVE 05/31/2020 1344   Betzabeth Derringer N Quintara Bost D.O. [facility]  If 7PM-7AM, please contact overnight-coverage provider If 7AM-7PM, please contact  day coverage provider [email or web address]  05/31/2020, 5:42 PM

## 2020-05-31 NOTE — ED Triage Notes (Signed)
Pt also s/o headache. Daughter reports pt has prostate cancer with mets to skull. Denies brain mets

## 2020-05-31 NOTE — ED Notes (Signed)
2 IV attempts

## 2020-05-31 NOTE — ED Provider Notes (Signed)
[facility] Emergency Department Provider Note  ____________________________________________  Time seen: Approximately 1:46 PM  I have reviewed the triage vital signs and the nursing notes.   HISTORY  Chief Complaint Altered Mental Status and Headache    HPI Brent Glass is a 80 y.o. male who presents the emergency department with his daughter for complaint of confusion, generalized weakness, headache.  Patient was recently seen, admitted in the hospital for similar symptoms.  At that time there was concern that patient had sustained a CVA.  Eventually imaging and neurology advised that patient had not sustained a CVA.  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  There was no evidence of intracranial lesions.  Patient was noted to have a significantly low vitamin B12 and and started on vitamin B12 shots.  Patient been doing well after discharge, started to have a return of headache last night.  Patient has tramadol for his prostate for pain and took 1 dose for his headache.  According to the daughter who is with the patient the patient was up most of the night with the headache.  There was no identified unilateral weakness.  This morning patient was exceptionally drowsy which the the daughter initially thought to be secondary to limited sleep.  She did check his blood glucose and found it to be 52, the patient was able to eat and blood glucose improved at home.  Patient currently endorses ongoing headache but denies any other complaints.  He had patient has a history of prostate cancer, diabetes, hypertension.  Patient has been taking his medications as prescribed.  No other reported complaints of fevers or chills, visual changes, neck pain or stiffness, chest pain, shortness of breath, abdominal pain, nausea vomiting, diarrhea or constipation.  No changes in urination.         Past Medical History:   Diagnosis Date  . Arthritis   . BPH (benign prostatic hyperplasia)   . BPH with obstruction/lower urinary tract symptoms   . Diabetes ([place])   . Disorder resulting from impaired renal function   . Elevated PSA   . Family history of lung cancer   . Family history of lymphoma   . Heart murmur   . Hypertension   . Knee pain   . Lesion of lung   . Low HDL (under 40)   . Obesity   . Urinary hesitancy     Patient Active Problem List   Diagnosis Date Noted  . Palliative care encounter   . 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 Acute confusion 05/27/2020  . Left-sided weakness 05/27/2020  . Diabetes mellitus without complication ([place]) 73/41/9379  . HTN (hypertension) 05/27/2020  . Acute CVA (cerebrovascular accident) ([place]) 05/27/2020  . Genetic testing 03/26/2020  . Family history of lung cancer   . Family history of lymphoma   . Prostate cancer metastatic to multiple sites ([place]) 02/04/2020  . Goals of care, counseling/discussion 02/04/2020  . Multiple lung nodules 01/23/2020  . BPH with obstruction/lower urinary tract symptoms 11/21/2014  . Elevated PSA 11/21/2014    Past Surgical History:  Procedure Laterality Date  . HERNIA REPAIR  0240   Umbilical Hernia Repair  . KNEE ARTHROSCOPY Left 04/10/2015   Procedure: ARTHROSCOPY KNEE, partial medial meniscectomy, partial synovectomy;  Surgeon: Hessie Knows, MD;  Location: [facility] ORS;  Service: Orthopedics;  Laterality: Left;    Prior to Admission medications   Medication Sig Start Date End  Date Taking? Authorizing Provider  abiraterone acetate (ZYTIGA) 250 MG tablet Take 4 tablets (1,000 mg total) by mouth daily. Take on an empty stomach 1 hour before or 2 hours after a meal 02/20/20   Cammie Sickle, MD  acetaminophen (TYLENOL) 650 MG CR tablet Take 650 mg by mouth every 8 (eight) hours as needed for pain.    [provider]  albuterol (PROVENTIL HFA;VENTOLIN HFA) 108 (90 BASE) MCG/ACT inhaler Inhale 2 puffs into the  lungs every 6 (six) hours as needed for wheezing or shortness of breath.    [provider]  aspirin 81 MG tablet Take 81 mg by mouth daily.    [provider]  cyanocobalamin (,VITAMIN B-12,) 1000 MCG/ML injection Inject 1 mL (1,000 mcg total) into the muscle daily for 5 days. 05/29/20 06/03/20  Lorella Nimrod, MD  finasteride (PROSCAR) 5 MG tablet TAKE 1 TABLET BY MOUTH EVERY DAY Patient taking differently: Take 5 mg by mouth daily. 11/09/17   [name] A, PA-C  glipiZIDE (GLUCOTROL XL) 10 MG 24 hr tablet Take 10 mg by mouth daily with breakfast. 11/09/14   [provider]  lisinopril-hydrochlorothiazide (ZESTORETIC) 10-12.5 MG tablet Take 1 tablet by mouth daily. Patient not taking: No sig reported 04/24/20   [provider]  metFORMIN (GLUCOPHAGE) 1000 MG tablet Take 1,000 mg by mouth 2 (two) times daily with a meal.    [provider]  potassium chloride SA (KLOR-CON) 20 MEQ tablet 1 pill twice a day Patient taking differently: Take 20 mEq by mouth 2 (two) times daily. 1 pill twice a day 05/14/20   Cammie Sickle, MD  predniSONE (DELTASONE) 5 MG tablet Take 1 tablet (5 mg total) by mouth daily with breakfast. Patient not taking: Reported on 05/27/2020 03/12/20   Cammie Sickle, MD  simvastatin (ZOCOR) 40 MG tablet Take 40 mg by mouth daily.    [provider]  thiamine 100 MG tablet Take 1 tablet (100 mg total) by mouth daily. 05/29/20   Lorella Nimrod, MD  TRADJENTA 5 MG TABS tablet Take 5 mg by mouth daily. 03/20/20   [provider]  traMADol (ULTRAM) 50 MG tablet TAKE 1 TABLET (50 MG TOTAL) BY MOUTH EVERY 6 (SIX) HOURS AS NEEDED FOR MODERATE PAIN OR SEVERE PAIN 05/06/20   Verlon Au, NP  vitamin B-12 2000 MCG tablet Take 1 tablet (2,000 mcg total) by mouth daily. 05/29/20   Lorella Nimrod, MD    Allergies Patient has no known allergies.  Family History  Problem Relation Age of Onset  . Diabetes Mother   . Lung  cancer Mother   . Lymphoma Son 28       died in 40.   . Kidney disease Neg Hx   . Prostate cancer Neg Hx     Social History Social History   Tobacco Use  . Smoking status: Current Every Day Smoker    Packs/day: 1.00    Types: Cigarettes  . Smokeless tobacco: Never Used  Vaping Use  . Vaping Use: Never used  Substance Use Topics  . Alcohol use: No    Alcohol/week: 0.0 standard drinks  . Drug use: No     Review of Systems  Constitutional: No fever/chills.  Positive generalized weakness and drowsiness Eyes: No visual changes. No discharge ENT: No upper respiratory complaints. Cardiovascular: no chest pain. Respiratory: no cough. No SOB. Gastrointestinal: No abdominal pain.  No nausea, no vomiting.  No diarrhea.  No constipation. Genitourinary: Negative  for dysuria. No hematuria Musculoskeletal: Negative for musculoskeletal pain. Skin: Negative for rash, abrasions, lacerations, ecchymosis. Neurological: Positive for headache and generalized weakness.  Denies focal weakness or numbness.  10 System ROS otherwise negative.  ____________________________________________   PHYSICAL EXAM:  VITAL SIGNS: ED Triage Vitals  Enc Vitals Group     BP 05/31/20 1321 (!) 141/79     Pulse Rate 05/31/20 1321 73     Resp 05/31/20 1321 16     Temp 05/31/20 1321 98.6 F (37 C)     Temp Source 05/31/20 1321 Oral     SpO2 05/31/20 1321 99 %     Weight --      Height --      Head Circumference --      Peak Flow --      Pain Score 05/31/20 1327 10     Pain Loc --      Pain Edu? --      Excl. in [place]? --      Constitutional: Patient is drowsy, but when spoken to patient wakes up and answers appropriately.  When awake patient is alert and oriented. Well appearing and in no acute distress. Eyes: Conjunctivae are normal. PERRL. EOMI. Head: Atraumatic. ENT:      Ears:       Nose: No congestion/rhinnorhea.      Mouth/Throat: Mucous membranes are moist.  Neck: No stridor.  Neck is  supple full range of motion Hematological/Lymphatic/Immunilogical: No cervical lymphadenopathy. Cardiovascular: Normal rate, regular rhythm. Normal S1 and S2.  Good peripheral circulation. Respiratory: Normal respiratory effort without tachypnea or retractions. Lungs CTAB. Good air entry to the bases with no decreased or absent breath sounds. Gastrointestinal: Bowel sounds 4 quadrants. Soft and nontender to palpation. No guarding or rigidity. No palpable masses. No distention. No CVA tenderness. Musculoskeletal: Full range of motion to all extremities. No gross deformities appreciated. Neurologic:  Normal speech and language. No gross focal neurologic deficits are appreciated.  Cranial nerves II through XII are grossly intact.  Equal grip strength bilateral upper extremities.  Negative Romberg's and pronator drift. Skin:  Skin is warm, dry and intact. No rash noted. Psychiatric: Mood and affect are normal. Speech and behavior are normal. Patient exhibits appropriate insight and judgement.   ____________________________________________   LABS (all labs ordered are listed, but only abnormal results are displayed)  Labs Reviewed  COMPREHENSIVE METABOLIC PANEL - Abnormal; Notable for the following components:      Result Value   Potassium 3.4 (*)    Glucose, Bld 67 (*)    Albumin 3.4 (*)    All other components within normal limits  CBC - Abnormal; Notable for the following components:   WBC 10.9 (*)    RBC 4.11 (*)    Hemoglobin 12.0 (*)    HCT 36.5 (*)    All other components within normal limits  URINALYSIS, COMPLETE (UACMP) WITH MICROSCOPIC - Abnormal; Notable for the following components:   Color, Urine YELLOW (*)    APPearance CLEAR (*)    Hgb urine dipstick SMALL (*)    All other components within normal limits  CBG MONITORING, ED - Abnormal; Notable for the following components:   Glucose-Capillary 34 (*)    All other components within normal limits  URINE DRUG SCREEN,  QUALITATIVE ([facility] ONLY)  VITAMIN B12  CBG MONITORING, ED  TROPONIN I (HIGH SENSITIVITY)  TROPONIN I (HIGH SENSITIVITY)   ____________________________________________  EKG   ____________________________________________  RADIOLOGY I personally viewed and evaluated  these images as part of my medical decision making, as well as reviewing the written report by the radiologist.  ED Provider Interpretation: Imaging is reassuring with no acute findings on chest x-ray or CT head  DG Chest 2 View  Result Date: 05/31/2020 CLINICAL DATA:  Weakness. Increased confusion and headache. History of prostate cancer EXAM: CHEST - 2 VIEW COMPARISON:  05/27/2020 FINDINGS: Normal heart size. Aortic atherosclerosis. Decreased lung volumes. No superimposed pleural effusion or interstitial edema. No airspace densities. No acute osseous findings. IMPRESSION: Low lung volumes.  No acute abnormality Electronically Signed   By: Kerby Moors M.D.   On: 05/31/2020 14:36   CT Head Wo Contrast  Result Date: 05/31/2020 CLINICAL DATA:  80 year old male with headache and confusion. History of prostate cancer. EXAM: CT HEAD WITHOUT CONTRAST TECHNIQUE: Contiguous axial images were obtained from the base of the skull through the vertex without intravenous contrast. COMPARISON:  05/27/2020 and prior studies FINDINGS: Brain: No evidence of acute infarction, hemorrhage, hydrocephalus, extra-axial collection or mass lesion/mass effect. Atrophy and mild chronic small-vessel white matter ischemic changes again noted. Vascular: Carotid atherosclerotic calcifications are noted. Skull: Sclerotic lesions within the visualized bony structures again noted. Sinuses/Orbits: Small amount of fluid within the sphenoid sinus is again noted. Other: None. IMPRESSION: 1. No evidence of acute intracranial abnormality. 2. Atrophy and chronic small-vessel white matter ischemic changes. 3. Osseous metastatic disease again noted. Electronically Signed    By: Margarette Canada M.D.   On: 05/31/2020 14:41    ____________________________________________    PROCEDURES  Procedure(s) performed:    Procedures    Medications  dextrose 50 % solution (  Given 05/31/20 1716)     ____________________________________________   INITIAL IMPRESSION / ASSESSMENT AND PLAN / ED COURSE  Pertinent labs & imaging results that were available during my care of the patient were reviewed by me and considered in my medical decision making (see chart for details).  Review of the [place] CSRS was performed in accordance of the [place] prior to dispensing any controlled drugs.           Patient's diagnosis is consistent with hypoglycemia, weakness.  Patient was admitted to the hospital several days ago for headache, unilateral weakness.  Patient was eventually found to not have a CVA, was very low on his B12.  Patient had been improving throughout hospital stay and was discharged home.  Patient has several days of no symptoms, developed a really bad headache last night.  Patient had taken tramadol and been up most the night.  Initially patient was very drowsy this morning and the daughter thought this was likely secondary to being up most the night.  However as patient symptoms persisted, he was complaining of a headache, she checked his blood sugar and it was 34.  Patient had multiple foods including honey running, candy, orange juice was only able to improve his blood glucose into the 80s.  Patient has had slow decline of his blood glucose here and latest check revealed that he had a blood glucose of 34.  Giving the ongoing hyperglycemia, feel the patient would be best suited with admission.  Patient is arousable, answers questions appropriately.  Reassuring neuro exam.  Overall patient's results are reassuring with the exception again of his ongoing hypoglycemia.  Patient care transferred to the hospitalist team at this time..       ____________________________________________  FINAL CLINICAL IMPRESSION(S) / ED DIAGNOSES  Final diagnoses:  Hypoglycemia  Weakness      NEW  MEDICATIONS STARTED DURING THIS VISIT:  ED Discharge Orders    None          This chart was dictated using voice recognition software/Dragon. Despite best efforts to proofread, errors can occur which can change the meaning. Any change was purely unintentional.    Darletta Moll, PA-C 05/31/20 1733    Duffy Bruce, MD 05/31/20 [phone]

## 2020-06-01 ENCOUNTER — Encounter: Payer: Self-pay | Admitting: Internal Medicine

## 2020-06-01 ENCOUNTER — Other Ambulatory Visit: Payer: Self-pay

## 2020-06-01 DIAGNOSIS — E119 Type 2 diabetes mellitus without complications: Secondary | ICD-10-CM | POA: Diagnosis not present

## 2020-06-01 DIAGNOSIS — C61 Malignant neoplasm of prostate: Secondary | ICD-10-CM

## 2020-06-01 DIAGNOSIS — E162 Hypoglycemia, unspecified: Secondary | ICD-10-CM | POA: Diagnosis not present

## 2020-06-01 LAB — BASIC METABOLIC PANEL
Anion gap: 8 (ref 5–15)
BUN: 11 mg/dL (ref 8–23)
CO2: 24 mmol/L (ref 22–32)
Calcium: 9.5 mg/dL (ref 8.9–10.3)
Chloride: 103 mmol/L (ref 98–111)
Creatinine, Ser: 1 mg/dL (ref 0.61–1.24)
GFR, Estimated: >60 mL/min (ref >60)
Glucose, Bld: 85 mg/dL (ref 70–99)
Potassium: 3.4 mmol/L — ABNORMAL LOW (ref 3.5–5.1)
Sodium: 135 mmol/L (ref 135–145)

## 2020-06-01 LAB — CBC
HCT: 34 % — ABNORMAL LOW (ref 39.0–52.0)
Hemoglobin: 11.2 g/dL — ABNORMAL LOW (ref 13.0–17.0)
MCH: 29.3 pg (ref 26.0–34.0)
MCHC: 32.9 g/dL (ref 30.0–36.0)
MCV: 89 fL (ref 80.0–100.0)
Platelets: 305 10*3/uL (ref 150–400)
RBC: 3.82 MIL/uL — ABNORMAL LOW (ref 4.22–5.81)
RDW: 13.9 % (ref 11.5–15.5)
WBC: 10 10*3/uL (ref 4.0–10.5)
nRBC: 0 % (ref 0.0–0.2)

## 2020-06-01 LAB — GLUCOSE, CAPILLARY
Glucose-Capillary: 92 mg/dL (ref 70–99)
Glucose-Capillary: 112 mg/dL — ABNORMAL HIGH (ref 70–99)
Glucose-Capillary: 113 mg/dL — ABNORMAL HIGH (ref 70–99)

## 2020-06-01 LAB — HEMOGLOBIN A1C
Hgb A1c MFr Bld: 6.4 % — ABNORMAL HIGH (ref 4.8–5.6)
Mean Plasma Glucose: 136.98 mg/dL

## 2020-06-01 LAB — SARS CORONAVIRUS 2 (TAT 6-24 HRS): SARS Coronavirus 2: NEGATIVE

## 2020-06-01 MED ORDER — POTASSIUM CHLORIDE 20 MEQ PO PACK
40.0000 meq | PACK | Freq: Once | ORAL | Status: AC
  Administered 2020-06-01: 40 meq via ORAL
  Filled 2020-06-01: qty 2

## 2020-06-01 MED ORDER — GUAIFENESIN-DM 100-10 MG/5ML PO SYRP: 5.0000 mL | ORAL_SOLUTION | ORAL | Status: DC | PRN

## 2020-06-01 MED ORDER — VITAMIN B-12 1000 MCG PO TABS
1000.0000 ug | ORAL_TABLET | Freq: Every day | ORAL | Status: DC
  Administered 2020-06-01: 1000 ug via ORAL
  Filled 2020-06-01: qty 1

## 2020-06-01 NOTE — Progress Notes (Signed)
OT Cancellation Note  Patient Details Name: TAJAY MUZZY MRN: [id number] DOB: 1940-11-10   Cancelled Treatment:    Reason Eval/Treat Not Completed: Other (comment). Pt currently working with PT. OT to re-attempt at next available.   Darleen Crocker, MS, OTR/L , CBIS ascom [phone]  06/01/20, 9:11 AM   06/01/2020, 9:11 AM

## 2020-06-01 NOTE — Evaluation (Signed)
Physical Therapy Evaluation Patient Details Name: Brent Glass MRN: [id number] DOB: [birth year] Today's Date: 06/01/2020   History of Present Illness  80 y.o. 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   Pt was discharged just days ago having had new onset L side weakness/dragging L LE.  Imaging revealed no intracranial abnormality, does have mets to his skull, however.  Clinical Impression  Pt sleeping on arrival but readily woke up, daughter present and helpful t/o the exam.  Pt did display some confusion with basic orientation questions, but was able to follow instructions and participate with PT with appropriate level of cuing.  He was able to get himself up to sitting EOB w/o assist but struggled with getting to standing (apparently this isn't even a thought normally) needing some assist from standard height bed.  He was able to ambulate ~200 ft, but does not normally need an AD and though he did manage ~100 ft w/o AD he was inconsistent and unsteady with this and did have a few small LOBs (occasionally needing light assist to insure safety) along with inconsistent L LE dragging, R knee lacking TKE (no overt buckling). On recent admit PT recommended outpt PT at discharge, however he has struggled with consistency and cognition HHPT appears to be the more appropriate disposition at this time, daughter assures me that they will be able to give 24/7 supervision at discharge.  Follow Up Recommendations Home health PT    Equipment Recommendations  Rolling walker with 5" wheels (seems that they do not have a walker to home)    Recommendations for Other Services       Precautions / Restrictions Precautions Precautions:  Fall Restrictions Weight Bearing Restrictions: No      Mobility  Bed Mobility Overal bed mobility: Modified Independent             General bed mobility comments: min increased effort    Transfers Overall transfer level: Needs assistance Equipment used: Rolling walker (2 wheeled) Transfers: Sit to/from Stand Sit to Stand: Min assist         General transfer comment: From standard height bed pt unable to rise on first attempt on his own,  Ambulation/Gait Ambulation/Gait assistance: Min guard;Supervision Gait Distance (Feet): 200 Feet Assistive device: Rolling walker (2 wheeled);None       General Gait Details: Pt was able to circumambulate the nurses station, ~75 ft with RW (clearly his safest and most consistent cadence), ~25 ft with hallway rail and ~100 ft w/o UE support.  He had inconsistent gait with occasional L foot clearance issues (minimal actual dragging), lacking R knee TKE much of the time, veering (hit walker on unmoving obstacles on both R and L side) and in general was near his normal confident and community appropriate baseline.  Stairs            Wheelchair Mobility    Modified Rankin (Stroke Patients Only)       Balance Overall balance assessment: Needs assistance Sitting-balance support: No upper extremity supported;Feet supported Sitting balance-Leahy Scale: Good     Standing balance support: No upper extremity supported Standing balance-Leahy Scale: Fair Standing balance comment: Pt did fairly well, showed much more steadiness/safety with walker.  During ambulation w/o AD pt had a few episodes needing direct assist to insure  safety including one small forward LOB when asked to stop by PT                             Pertinent Vitals/Pain Pain Assessment: No/denies pain    Home Living Family/patient expects to be discharged to:: Private residence Living Arrangements: Children (had been living alone, will now have 24/7  assist (granddaughter moving in, daughter able to be consistently around)) Available Help at Discharge: Available 24 hours/day;Family Type of Home: House Home Access: Stairs to enter Entrance Stairs-Rails: None Entrance Stairs-Number of Steps: 3 Home Layout: One level Home Equipment: Cane - single point      Prior Function Level of Independence: Independent         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  A couple of falls (tripped on steps) in past 6 months.     Hand Dominance        Extremity/Trunk Assessment   Upper Extremity Assessment Upper Extremity Assessment: Overall WFL for tasks assessed;Generalized weakness (strength appears = b/l)    Lower Extremity Assessment Lower Extremity Assessment: Overall WFL for tasks assessed       Communication   Communication: No difficulties  Cognition Arousal/Alertness: Awake/alert Behavior During Therapy: WFL for tasks assessed/performed Overall Cognitive Status: Impaired/Different from baseline                                 General Comments: Pt knew he was at the hospital but did not know date/year, did not recall most of recent hospitalization; per daughter he is normally very aware and sharp.      General Comments      Exercises     Assessment/Plan    PT Assessment Patient needs continued PT services  PT Problem List Decreased strength;Decreased balance;Decreased mobility;Decreased knowledge of use of DME;Decreased knowledge of precautions       PT Treatment Interventions DME instruction;Gait training;Stair training;Functional mobility training;Therapeutic activities;Therapeutic exercise;Balance training;Patient/family education    PT Goals (Current goals can be found in the Care Plan section)  Acute Rehab PT Goals Patient Stated Goal: to go home PT Goal Formulation: With patient Time For Goal Achievement: 06/15/20    Frequency Min 2X/week   Barriers to discharge         Co-evaluation               AM-PAC PT "6 Clicks" Mobility  Outcome Measure Help needed turning from your back to your side while in a flat bed without using bedrails?: None Help needed moving from lying on your back to sitting on the side of a flat bed without using bedrails?: A Little Help needed moving to and from a bed to a chair (including a wheelchair)?: A Little Help needed standing up from a chair using your arms (e.g., wheelchair or bedside chair)?: A Little Help needed to walk in hospital room?: A Little Help needed climbing 3-5 steps with a railing? : A Little 6 Click Score: 19    End of Session Equipment Utilized During Treatment: Gait belt Activity Tolerance: Patient tolerated treatment well Patient left: in chair;with call bell/phone within reach;with chair alarm set Nurse Communication: Mobility status PT Visit Diagnosis: Other abnormalities of gait and mobility (R26.89);Muscle weakness (generalized) (M62.81);History of falling (Z91.81);Unsteadiness on feet (R26.81)    Time: 1287-8676 PT Time Calculation (min) (ACUTE ONLY): 33 min  Charges:   PT Evaluation $PT Eval Low Complexity: 1 Low PT Treatments $Gait Training: 8-22 mins        Kreg Shropshire, DPT 06/01/2020, 12:10 PM

## 2020-06-01 NOTE — Plan of Care (Signed)

## 2020-06-01 NOTE — TOC Progression Note (Signed)
Transition of Care [facility]) - Progression Note    Patient Details  Name: Brent Glass MRN: [id number] Date of Birth: 04/18/1940  Transition of Care [facility]) CM/SW Contact  Brent Price, RN Phone Number: 06/01/2020, 2:21 PM  Clinical Narrative:   Patient to be discharged Home/Home Health PT with DME TW. Spoke with daughter Brent Glass. [facility] accepted per Brent Glass. [employer] will deliver Rolling Walker to home address this afternoon. Daughter updated and number for [facility] given to her. No further questions. Communication with Unit RN and provider. Brent Davies RN CM          Expected Discharge Plan and Services           Expected Discharge Date: 06/01/20               DME Arranged: [facility] Rile rolling DME Agency:  [facility]elesta Aver per Brent Glass) Date DME Agency Contacted: 06/01/20 Time DME Agency Contacted: [id number] Representative spoke with at DME Agency: Brent Glass HH Arranged: PT [place] Agency: [place] Date [place]: 06/01/20 Time [place]: [place] Representative spoke with at [place]: [place] Determinants of Health ([place]) Interventions    Readmission Risk Interventions No flowsheet data found.

## 2020-06-01 NOTE — Plan of Care (Signed)
Admitted pt from ED, see assessment details. VSS, NAD observed   Problem: Education: Goal: Knowledge of General Education information will improve Description: Including pain rating scale, medication(s)/side effects and non-pharmacologic comfort measures Outcome: Progressing   Problem: Health Behavior/Discharge Planning: Goal: Ability to manage health-related needs will improve Outcome: Progressing   Problem: Clinical Measurements: Goal: Ability to maintain clinical measurements within normal limits will improve Outcome: Progressing Goal: Will remain free from infection Outcome: Progressing Goal: Diagnostic test results will improve Outcome: Progressing Goal: Respiratory complications will improve Outcome: Progressing Goal: Cardiovascular complication will be avoided Outcome: Progressing   Problem: Activity: Goal: Risk for activity intolerance will decrease Outcome: Progressing   Problem: Nutrition: Goal: Adequate nutrition will be maintained Outcome: Progressing   Problem: Coping: Goal: Level of anxiety will decrease Outcome: Progressing   Problem: Elimination: Goal: Will not experience complications related to bowel motility Outcome: Progressing Goal: Will not experience complications related to urinary retention Outcome: Progressing   Problem: Pain Managment: Goal: General experience of comfort will improve Outcome: Progressing   Problem: Safety: Goal: Ability to remain free from injury will improve Outcome: Progressing   Problem: Skin Integrity: Goal: Risk for impaired skin integrity will decrease Outcome: Progressing

## 2020-06-01 NOTE — Discharge Summary (Signed)
Physician Discharge Summary  Patient ID: Brent Glass MRN: [id number] DOB/AGE: 1940/06/13 80 y.o.  Admit date: 05/31/2020 Discharge date: 06/01/2020  Admission Diagnoses:  Discharge Diagnoses:  Principal Problem:   Hypoglycemia Active Problems:   BPH with obstruction/lower urinary tract symptoms   Elevated PSA   Multiple lung nodules   Prostate cancer metastatic to multiple sites [facility])   Family history of lung cancer   Family history of lymphoma   Carboxyhemoglobinemia   Diabetes mellitus without complication ([place])   HTN (hypertension)   Discharged Condition: good  Hospital Course:  Brent Glass is a 80 y.o. 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 Patient was found to have a significant hypoglycemia with glucose 60 and 70.  He was given D50, he was also giving D5 infusion. Glucose has been normalized today.  At this point, patient is medically stable to be discharged.  I will discontinue all diabetic meds except Metformin.  To follow-up with PCP in the near future.  #1.  Hypoglycemia with type 2 diabetes. Condition had improved.  2.  Vitamin B12 deficient anemia. Continue oral supplement.  #3.  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 Followed with oncology as outpatient.    Consults: None  Significant Diagnostic Studies:   Treatments: D5  Discharge Exam: Blood pressure (!) 181/76, pulse 61, temperature 97.8 F (36.6 C), temperature source Oral, resp. rate 16, height 6' (1.829 m), weight 91.7 kg, SpO2 100 %. General appearance: alert and cooperative Resp: clear to auscultation bilaterally Cardio: regular rate and rhythm, S1, S2 normal, no murmur, click, rub or gallop GI: soft,  non-tender; bowel sounds normal; no masses,  no organomegaly Extremities: extremities normal, atraumatic, no cyanosis or edema  Disposition: Discharge disposition: 01-Home or Self Care       Discharge Instructions    Diet - low sodium heart healthy   Complete by: As directed    Increase activity slowly   Complete by: As directed      Allergies as of 06/01/2020   No Known Allergies     Medication List    STOP taking these medications   glipiZIDE 10 MG 24 hr tablet Commonly known as: GLUCOTROL XL   Tradjenta 5 MG Tabs tablet Generic drug: linagliptin     TAKE these medications   abiraterone acetate 250 MG tablet Commonly known as: ZYTIGA Take 4 tablets (1,000 mg total) by mouth daily. Take on an empty stomach 1 hour before or 2 hours after a meal   acetaminophen 650 MG CR tablet Commonly known as: TYLENOL Take 650 mg by mouth every 8 (eight) hours as needed for pain.   albuterol 108 (90 Base) MCG/ACT inhaler Commonly known as: VENTOLIN HFA Inhale 2 puffs into the lungs every 6 (six) hours as needed for wheezing or shortness of breath.   aspirin 81 MG tablet Take 81 mg by mouth daily.   cyanocobalamin 2000 MCG tablet Take 1 tablet (2,000 mcg total) by mouth daily. What changed: Another medication with the same name was removed. Continue taking this medication, and follow the directions you see here.   finasteride 5 MG tablet Commonly known as: PROSCAR TAKE 1 TABLET BY MOUTH EVERY DAY   lisinopril-hydrochlorothiazide 10-12.5 MG tablet Commonly known as: ZESTORETIC Take 1 tablet by mouth  daily.   metFORMIN 1000 MG tablet Commonly known as: GLUCOPHAGE Take 1,000 mg by mouth 2 (two) times daily with a meal.   potassium chloride SA 20 MEQ tablet Commonly known as: KLOR-CON 1 pill twice a day What changed:   how much to take  how to take this  when to take this   predniSONE 5 MG tablet Commonly known as: DELTASONE Take 1 tablet (5 mg total) by mouth  daily with breakfast.   simvastatin 40 MG tablet Commonly known as: ZOCOR Take 40 mg by mouth daily.   thiamine 100 MG tablet Take 1 tablet (100 mg total) by mouth daily.   traMADol 50 MG tablet Commonly known as: ULTRAM TAKE 1 TABLET (50 MG TOTAL) BY MOUTH EVERY 6 (SIX) HOURS AS NEEDED FOR MODERATE PAIN OR SEVERE PAIN       Follow-up Information    Aycock, Ngwe A, MD Follow up in 1 week(s).   Specialty: Family Medicine Contact information: [place] [address] [phone]               Signed: Sharen Hones 06/01/2020, 12:53 PM

## 2020-06-02 LAB — GLUCOSE, CAPILLARY: Glucose-Capillary: 34 mg/dL — CL (ref 70–99)

## 2020-06-02 LAB — CULTURE, BLOOD (ROUTINE X 2)
Culture: NO GROWTH
Culture: NO GROWTH
Special Requests: ADEQUATE

## 2020-06-04 ENCOUNTER — Telehealth: Payer: Self-pay | Admitting: *Deleted

## 2020-06-04 DIAGNOSIS — C61 Malignant neoplasm of prostate: Secondary | ICD-10-CM

## 2020-06-04 LAB — VITAMIN B1: Vitamin B1 (Thiamine): 117.1 nmol/L (ref 66.5–200.0)

## 2020-06-04 NOTE — Telephone Encounter (Signed)
Pt's daughter called in to get advice and further guidance with pt. Pt has been admitted twice in the past 2 weeks for altered mental status. CVA and brain mets were ruled out based on imaging. It was determined that pt had low b12 and hypoglycemia contributing to his cognitive decline. Once b12 and glucose were corrected, pt returned to baseline. Pt has been at home for the past few days and his blood sugars have been fluctuating causing altered mental status again. Pt's daughter is asking for further advice or any recommendations and if it would be appropriate for him to see neurology as an outpatient.  Please advise.

## 2020-06-04 NOTE — Telephone Encounter (Signed)
Pt's daughter stated that they have stopped the metformin. Not sure about the prednisone though. I can call her back to inform her that he needs to continue taking. She has been tracking his CBG readings as well.   Dr. B would you like for pt to see Dr. Mickeal Skinner when his clinic starts here?

## 2020-06-04 NOTE — Telephone Encounter (Signed)
Hayley,   Is this patient still taking the metformin? If so, that should probably be held if he is having residual hypoglycemia. I am thinking that his oral intake is poor? Is he still taking the prednisone? That could be restarted for appetite and would likely have the effect of increasing CBG. Family should be tracking CBGs if possible. Regarding neurology, I will defer to Dr. Rogue Bussing. However, we do have Dr. Mickeal Skinner starting at the [facility] on 4/1 and he can see general neurology consults in the context of cancer.

## 2020-06-05 NOTE — Telephone Encounter (Signed)
Referral placed. Pt's daughter will be made aware.

## 2020-06-05 NOTE — Telephone Encounter (Signed)
Hayley- I think its reasonable that pt could see Dr.Vaslow for his in put. Thanks GB

## 2020-06-13 ENCOUNTER — Telehealth: Payer: Self-pay | Admitting: Pharmacy Technician

## 2020-06-13 ENCOUNTER — Encounter: Payer: Self-pay | Admitting: Internal Medicine

## 2020-06-13 ENCOUNTER — Telehealth: Payer: Self-pay | Admitting: Pharmacist

## 2020-06-13 ENCOUNTER — Inpatient Hospital Stay (HOSPITAL_BASED_OUTPATIENT_CLINIC_OR_DEPARTMENT_OTHER): Payer: Medicare Other | Admitting: Internal Medicine

## 2020-06-13 ENCOUNTER — Inpatient Hospital Stay: Payer: Medicare Other | Attending: Internal Medicine

## 2020-06-13 ENCOUNTER — Other Ambulatory Visit (HOSPITAL_COMMUNITY): Payer: Self-pay

## 2020-06-13 DIAGNOSIS — Z833 Family history of diabetes mellitus: Secondary | ICD-10-CM | POA: Insufficient documentation

## 2020-06-13 DIAGNOSIS — Z7952 Long term (current) use of systemic steroids: Secondary | ICD-10-CM | POA: Diagnosis not present

## 2020-06-13 DIAGNOSIS — Z79899 Other long term (current) drug therapy: Secondary | ICD-10-CM | POA: Diagnosis not present

## 2020-06-13 DIAGNOSIS — Z8673 Personal history of transient ischemic attack (TIA), and cerebral infarction without residual deficits: Secondary | ICD-10-CM | POA: Diagnosis not present

## 2020-06-13 DIAGNOSIS — Z7984 Long term (current) use of oral hypoglycemic drugs: Secondary | ICD-10-CM | POA: Diagnosis not present

## 2020-06-13 DIAGNOSIS — C61 Malignant neoplasm of prostate: Secondary | ICD-10-CM

## 2020-06-13 DIAGNOSIS — C7951 Secondary malignant neoplasm of bone: Secondary | ICD-10-CM | POA: Diagnosis not present

## 2020-06-13 DIAGNOSIS — Z807 Family history of other malignant neoplasms of lymphoid, hematopoietic and related tissues: Secondary | ICD-10-CM | POA: Insufficient documentation

## 2020-06-13 DIAGNOSIS — I10 Essential (primary) hypertension: Secondary | ICD-10-CM | POA: Insufficient documentation

## 2020-06-13 DIAGNOSIS — Z801 Family history of malignant neoplasm of trachea, bronchus and lung: Secondary | ICD-10-CM | POA: Insufficient documentation

## 2020-06-13 DIAGNOSIS — I674 Hypertensive encephalopathy: Secondary | ICD-10-CM | POA: Diagnosis not present

## 2020-06-13 DIAGNOSIS — Z7982 Long term (current) use of aspirin: Secondary | ICD-10-CM | POA: Diagnosis not present

## 2020-06-13 DIAGNOSIS — C7800 Secondary malignant neoplasm of unspecified lung: Secondary | ICD-10-CM | POA: Diagnosis not present

## 2020-06-13 LAB — COMPREHENSIVE METABOLIC PANEL
ALT: 10 U/L (ref 0–44)
AST: 14 U/L — ABNORMAL LOW (ref 15–41)
Albumin: 3.9 g/dL (ref 3.5–5.0)
Alkaline Phosphatase: 95 U/L (ref 38–126)
Anion gap: 11 (ref 5–15)
BUN: 28 mg/dL — ABNORMAL HIGH (ref 8–23)
CO2: 23 mmol/L (ref 22–32)
Calcium: 9.9 mg/dL (ref 8.9–10.3)
Chloride: 101 mmol/L (ref 98–111)
Creatinine, Ser: 1.27 mg/dL — ABNORMAL HIGH (ref 0.61–1.24)
GFR, Estimated: 57 mL/min — ABNORMAL LOW (ref >60)
Glucose, Bld: 279 mg/dL — ABNORMAL HIGH (ref 70–99)
Potassium: 4.1 mmol/L (ref 3.5–5.1)
Sodium: 135 mmol/L (ref 135–145)
Total Bilirubin: 0.3 mg/dL (ref 0.3–1.2)
Total Protein: 7.1 g/dL (ref 6.5–8.1)

## 2020-06-13 LAB — CBC WITH DIFFERENTIAL/PLATELET
Abs Immature Granulocytes: 0.03 10*3/uL (ref 0.00–0.07)
Basophils Absolute: 0.1 10*3/uL (ref 0.0–0.1)
Basophils Relative: 1 %
Eosinophils Absolute: 0.4 10*3/uL (ref 0.0–0.5)
Eosinophils Relative: 4 %
HCT: 38.7 % — ABNORMAL LOW (ref 39.0–52.0)
Hemoglobin: 12.9 g/dL — ABNORMAL LOW (ref 13.0–17.0)
Immature Granulocytes: 0 %
Lymphocytes Relative: 37 %
Lymphs Abs: 3.9 10*3/uL (ref 0.7–4.0)
MCH: 29.9 pg (ref 26.0–34.0)
MCHC: 33.3 g/dL (ref 30.0–36.0)
MCV: 89.6 fL (ref 80.0–100.0)
Monocytes Absolute: 0.7 10*3/uL (ref 0.1–1.0)
Monocytes Relative: 7 %
Neutro Abs: 5.5 10*3/uL (ref 1.7–7.7)
Neutrophils Relative %: 51 %
Platelets: 320 10*3/uL (ref 150–400)
RBC: 4.32 MIL/uL (ref 4.22–5.81)
RDW: 13.7 % (ref 11.5–15.5)
WBC: 10.7 10*3/uL — ABNORMAL HIGH (ref 4.0–10.5)
nRBC: 0 % (ref 0.0–0.2)

## 2020-06-13 LAB — MAGNESIUM: Magnesium: 1.6 mg/dL — ABNORMAL LOW (ref 1.7–2.4)

## 2020-06-13 LAB — PSA: Prostatic Specific Antigen: 0.71 ng/mL (ref 0.00–4.00)

## 2020-06-13 NOTE — Telephone Encounter (Addendum)
Oral Oncology Patient Advocate Encounter  Prior Authorization for [name] has been approved.    PA# [id number] Effective dates: 06/13/20 through 03/07/21  Patients co-pay is $2,535.35.   Oral Oncology Clinic will continue to follow.   [place] Patient [place] Phone [phone] Fax [phone] 06/13/2020 12:36 PM

## 2020-06-13 NOTE — Telephone Encounter (Signed)
Oral Oncology Patient Advocate Encounter  Met patient after appointment today to complete an application for [facility] in an effort to reduce the patient's out of pocket expense for Xtandi to $0.    Application completed and faxed to [phone] on 06/13/20.   [place] phone number for follow up is [phone].   This encounter will be updated until final determination.  [place] Patient [place] Phone [phone] Fax [phone] 06/16/2020 9:08 AM

## 2020-06-13 NOTE — Assessment & Plan Note (Addendum)
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  PROSTATE ADENOCARCINOMA- castrate sensitive; November 2021 PET scan-multiple lung lesions multiple bone lesions and prostate uptake.  STABLE; clinically.   #Discontinue Zytiga plus prednisone-given recent PRESS syndrome/severe hypokalemia].  Plan Xtandi-monitor blood pressures closely on Xtandi.   # Severe hypokalemia 2.5-secondary to Zytiga; potassium improved.  Zytiga discontinued.  #Status changes/PRESS syndrome-reviewed MRI; discussed with Dr.Vaslow.  Hypokalemia could have triggered PRESS syndrome.  Mental status is currently resolved.  # Left ankle pain-no obvious trauma noted.  Likely musculoskeletal.  Monitor for now.  If worse recommend x-rays.  #Chest tenderness-grade 1-2.  Stable sec to ADT- continue tramadol prn; add vaseline BID.   # DISPOSITION: # Follow up in 4 weeks MD- cbc/cmp- Dr.B

## 2020-06-13 NOTE — Telephone Encounter (Signed)
Oral Oncology Pharmacist Encounter  Received new prescription for Xtandi (enzalutamide) for the treatment of castration sensitive metastatic prostate cancer in conjunction with ADT, planned duration until disease progression or unacceptable drug toxicity. Patient is transitioning from abiraterone due to severe hypokalemia. Plan to start the Xtandi in about 4 weeks.  Prescription dose and frequency assessed. MD plans to start with a reduced dose of 3 tablets (120mg ) daily.  Current medication list in Epic reviewed, one DDIs with tramadol identified: - Tramadol: Xtandi may decrease the serum concentration of tramadol. Monitor patient for decreased effectiveness of tramadol. No baseline dose adjustment needed.   Evaluated chart and no patient barriers to medication adherence identified.   Mr. Daws will need manufacturer assistance to cover the medication due to a high out of pocket cost, awaiting approval.   Oral Oncology Clinic will continue to follow for insurance authorization, copayment issues, initial counseling and start date.   Darl Pikes, PharmD, BCPS, BCOP, CPP Hematology/Oncology Clinical Pharmacist Practitioner [facility]/AP [facility] [phone]  06/13/2020 11:18 AM

## 2020-06-13 NOTE — Telephone Encounter (Signed)
Oral Oncology Patient Advocate Encounter  Received notification from [employer] Part D that prior authorization for [name] is required.  PA submitted on CoverMyMeds Key [id number]  Status is pending  Oral Oncology Clinic will continue to follow.  [place] Patient [place] Phone [phone] Fax [phone] 06/13/2020 12:29 PM

## 2020-06-13 NOTE — Progress Notes (Signed)
[facility], [place] [address] [phone]   New Patient Evaluation  Date of Service: 06/13/20 Patient Name: Brent Glass Patient MRN: [id number] Patient DOB: Mar 28, 1940 Provider: Ventura Sellers, MD  Identifying Statement:  Brent Glass is a 80 y.o. 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    Referring Provider: Cammie Sickle, MD [address] 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   # NOV 2021- CTA [ER] Diffuse bilateral pulmonary nodules including a cavitary lesion in the right upper lobe measuring approximately 2 cm. There is extensive nodular pleural thickening bilaterally, greatest in the left lower lobe.  November 2021 PET scan-multiple lung lesions multiple bone lesions and prostate uptake.    # DM-2; NO COPD [?]; ACTIVE SMOKER 65ppd.  # DEC 2nd week- FIRMAGON; # JAN 12TH, 2022-ZYTIGA  1000 MG+ PRED 5MG; FEB MID 2022- ELIGARD q6M  # NGS/MOLECULAR TESTS: MARCH 2022-foundation 1 no targetable mutation./Genetic evaluation negative    # PALLIATIVE CARE EVALUATION:  # PAIN MANAGEMENT:    DIAGNOSIS: Prostate cancer  STAGE:   IV      ;  GOALS: Palliative  CURRENT/MOST RECENT THERAPY : ADT+ ZYTIGA    Prostate cancer metastatic to multiple sites [facility])  02/04/2020 Initial Diagnosis   Prostate cancer metastatic to multiple sites [facility])   03/19/2020 Cancer Staging   Staging form: Prostate, AJCC 8th Edition - Clinical: Stage IVB (pM1b) - Signed by Cammie Sickle, MD on 03/19/2020    Genetic Testing   Negative genetic testing. No pathogenic variants identified on the [facility] Multi-Cancer Panel. VUS in AXIN2 called c.1235A>C identified. The report date is  03/23/2020.   The Multi-Cancer Panel offered by [facility] includes sequencing and/or deletion duplication testing of the following 84 genes: AIP, ALK, APC, ATM, AXIN2,BAP1,  BARD1, BLM, BMPR1A, BRCA1, BRCA2, BRIP1, CASR, CDC73, CDH1, CDK4, CDKN1B, CDKN1C, CDKN2A (p14ARF), CDKN2A (p16INK4a), CEBPA, CHEK2, CTNNA1, DICER1, DIS3L2, EGFR (c.2369C>T, p.Thr790Met variant only), EPCAM (Deletion/duplication testing only), FH, FLCN, GATA2, GPC3, GREM1 (Promoter region deletion/duplication testing only), HOXB13 (c.251G>A, p.Gly84Glu), HRAS, KIT, MAX, MEN1, MET, MITF (c.952G>A, p.Glu318Lys variant only), MLH1, MSH2, MSH3, MSH6, MUTYH, NBN, NF1, NF2, NTHL1, PALB2, PDGFRA, PHOX2B, PMS2, POLD1, POLE, POT1, PRKAR1A, PTCH1, PTEN, RAD50, RAD51C, RAD51D, RB1, RECQL4, RET, RUNX1, SDHAF2, SDHA (sequence changes only), SDHB, SDHC, SDHD, SMAD4, SMARCA4, SMARCB1, SMARCE1, STK11, SUFU, TERC, TERT, TMEM127, TP53, TSC1, TSC2, VHL, WRN and WT1.      History of Present Illness: The patient's records from the referring physician were obtained and reviewed and the patient interviewed to confirm this HPI.  Brent Glass presents today for evaluation following two separate admissions this month for neurologic complaints.  Initially, he presented on 05/27/20 with sudden onset (last seen at baseline hours earlier) confusion, disorientation, and lateralized headache.  Daughter brought him to ED, where his blood pressure registered greater than 200/100.  He underwent evaluation for stroke which was negative.  He gradually returned to baseline, and was discharged to home on 05/29/20 very close to baseline mentation.  Unfortunately on 3/26 he had some recurrence of confusion (milder this time), and was found to be in hypoglycemic state.  Mental status improved with correction of metabolic insult at that time.  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  Daughter feels he has "slowed down a  little" but is very close to where he was a month ago.  Medications: Current Outpatient Medications on File Prior to Visit  Medication Sig Dispense Refill  . acetaminophen (TYLENOL) 650 MG CR tablet Take 650 mg by mouth every 8 (eight) hours as needed for pain.    Marland Kitchen albuterol (PROVENTIL HFA;VENTOLIN HFA) 108 (90 BASE) MCG/ACT inhaler Inhale 2 puffs into the lungs every 6 (six) hours as needed for wheezing or shortness of breath.    Marland Kitchen aspirin 81 MG tablet Take 81 mg by mouth daily.    . finasteride (PROSCAR) 5 MG tablet TAKE 1 TABLET BY MOUTH EVERY DAY (Patient taking differently: Take 5 mg by mouth daily.) 90 tablet 3  . lisinopril-hydrochlorothiazide (ZESTORETIC) 10-12.5 MG tablet Take 1 tablet by mouth daily.    . metFORMIN (GLUCOPHAGE) 1000 MG tablet Take 1,000 mg by mouth 2 (two) times daily with a meal.    . potassium chloride SA (KLOR-CON M20) 20 MEQ tablet 1 PILL TWICE A DAY 40 tablet 0  . simvastatin (ZOCOR) 40 MG tablet Take 40 mg by mouth daily.    Marland Kitchen thiamine 100 MG tablet Take 1 tablet (100 mg total) by mouth daily. 90 tablet 0  . traMADol (ULTRAM) 50 MG tablet TAKE 1 TABLET (50 MG TOTAL) BY MOUTH EVERY 6 (SIX) HOURS AS NEEDED FOR MODERATE PAIN OR SEVERE PAIN 30 tablet 0  . vitamin B-12 2000 MCG tablet Take 1 tablet (2,000 mcg total) by mouth daily. 90 tablet 1  . abiraterone acetate (ZYTIGA) 250 MG tablet Take 4 tablets (1,000 mg total) by mouth daily. Take on an empty stomach 1 hour before or 2 hours after a meal (Patient not taking: Reported on 06/13/2020) 120 tablet 0  . predniSONE (DELTASONE) 5 MG tablet Take 1 tablet (5 mg total) by mouth daily with breakfast. (Patient not taking: No sig reported) 30 tablet 3   No current facility-administered medications on file prior to visit.    Allergies: No Known Allergies Past Medical History:  Past Medical History:  Diagnosis Date  . Arthritis   . BPH (benign prostatic hyperplasia)   . BPH with obstruction/lower urinary tract  symptoms   . Diabetes ([place])   . Disorder resulting from impaired renal function   . Elevated PSA   . Family history of lung cancer   . Family history of lymphoma   . Heart murmur   . Hypertension   . Knee pain   . Lesion of lung   . Low HDL (under 40)   . Obesity   . Urinary hesitancy    Past Surgical History:  Past Surgical History:  Procedure Laterality Date  . HERNIA REPAIR  3235   Umbilical Hernia Repair  . KNEE ARTHROSCOPY Left 04/10/2015   Procedure: ARTHROSCOPY KNEE, partial medial meniscectomy, partial synovectomy;  Surgeon: Hessie Knows, MD;  Location: [facility] ORS;  Service: Orthopedics;  Laterality: Left;   Social History:  Social History   Socioeconomic History  . Marital status: Widowed    Spouse name: Not on file  . Number of children: Not on file  . Years of education: Not on file  . Highest education level: Not on file  Occupational History  . Not on file  Tobacco Use  . Smoking status: Current Every Day Smoker    Packs/day: 1.00    Types: Cigarettes  . Smokeless tobacco:  Never Used  Vaping Use  . Vaping Use: Never used  Substance and Sexual Activity  . Alcohol use: No    Alcohol/week: 0.0 standard drinks  . Drug use: No  . Sexual activity: Not Currently  Other Topics Concern  . Not on file  Social History Narrative   Lives in [place]; self; daughter lives 1 mile. Smoke 1ppd > 65 years; stopped 25 years ago.  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    Social Determinants of Health   Financial Resource Strain: Not on file  Food Insecurity: Not on file  Transportation Needs: Not on file  Physical Activity: Not on file  Stress: Not on file  Social Connections: Not on file  Intimate Partner Violence: Not on file   Family History:  Family History  Problem Relation Age of Onset  . Diabetes Mother   . Lung cancer Mother   . Lymphoma Son 28       died in 53.   . Kidney disease Neg Hx   . Prostate cancer Neg Hx      Review of Systems: Constitutional: Doesn't report fevers, chills or abnormal weight loss Eyes: Doesn't report blurriness of vision Ears, nose, mouth, throat, and face: Doesn't report sore throat Respiratory: Doesn't report cough, dyspnea or wheezes Cardiovascular: Doesn't report palpitation, chest discomfort  Gastrointestinal:  Doesn't report nausea, constipation, diarrhea GU: Doesn't report incontinence Skin: Doesn't report skin rashes Neurological: Per HPI Musculoskeletal: Doesn't report joint pain Behavioral/Psych: Doesn't report anxiety  Physical Exam: Vitals:   06/13/20 0948  BP: (!) 94/57  Pulse: 93  Resp: 20  Temp: 98.9 F (37.2 C)  SpO2: 99%   KPS: 80. General: Alert, cooperative, pleasant, in no acute distress Head: Normal EENT: No conjunctival injection or scleral icterus.  Lungs: Resp effort normal Cardiac: Regular rate Abdomen: Non-distended abdomen Skin: No rashes cyanosis or petechiae. Extremities: No clubbing or edema  Neurologic Exam: Mental Status: Awake, alert, attentive to examiner. Oriented to self and environment. Language is fluent with intact comprehension.  Age appropriate psychomotor slowing. Cranial Nerves: Visual acuity is grossly normal. Visual fields are full. Extra-ocular movements intact. No ptosis. Face is symmetric Motor: Tone and bulk are normal. Power is full in both arms and legs. Reflexes are symmetric, no pathologic reflexes present.  Sensory: Intact to light touch Gait: Normal.   Labs: I have reviewed the data as listed    Component Value Date/Time   NA 135 06/13/2020 0926   K 4.1 06/13/2020 0926   CL 101 06/13/2020 0926   CO2 23 06/13/2020 0926   GLUCOSE 279 (H) 06/13/2020 0926   BUN 28 (H) 06/13/2020 0926   CREATININE 1.27 (H) 06/13/2020 0926   CALCIUM 9.9 06/13/2020 0926   PROT 7.1 06/13/2020 0926   ALBUMIN 3.9 06/13/2020 0926   AST 14 (L) 06/13/2020 0926   ALT 10 06/13/2020 0926   ALKPHOS 95 06/13/2020 0926    BILITOT 0.3 06/13/2020 0926   GFRNONAA 57 (L) 06/13/2020 0926   GFRAA >60 04/03/2015 0934   Lab Results  Component Value Date   WBC 10.0 06/01/2020   NEUTROABS 6.9 05/28/2020   HGB 11.2 (L) 06/01/2020   HCT 34.0 (L) 06/01/2020   MCV 89.0 06/01/2020   PLT 305 06/01/2020    Imaging:  DG Chest 2 View  Result Date: 05/31/2020 CLINICAL DATA:  Weakness. Increased confusion and headache. History of prostate cancer EXAM: CHEST - 2 VIEW COMPARISON:  05/27/2020 FINDINGS: Normal heart size. Aortic atherosclerosis. Decreased  lung volumes. No superimposed pleural effusion or interstitial edema. No airspace densities. No acute osseous findings. IMPRESSION: Low lung volumes.  No acute abnormality Electronically Signed   By: Kerby Moors M.D.   On: 05/31/2020 14:36   DG Chest 2 View  Result Date: 05/27/2020 CLINICAL DATA:  80 year old male with altered mental status. EXAM: CHEST - 2 VIEW COMPARISON:  Chest radiograph dated 01/16/2020. FINDINGS: Left lung base linear atelectasis/scarring. No focal consolidation, pleural effusion, pneumothorax. Mild cardiomegaly. Atherosclerotic calcification of the aortic arch. No acute osseous pathology. IMPRESSION: No active cardiopulmonary disease. Electronically Signed   By: Anner Crete M.D.   On: 05/27/2020 02:46   CT Head Wo Contrast  Result Date: 05/31/2020 CLINICAL DATA:  80 year old male with headache and confusion. History of prostate cancer. EXAM: CT HEAD WITHOUT CONTRAST TECHNIQUE: Contiguous axial images were obtained from the base of the skull through the vertex without intravenous contrast. COMPARISON:  05/27/2020 and prior studies FINDINGS: Brain: No evidence of acute infarction, hemorrhage, hydrocephalus, extra-axial collection or mass lesion/mass effect. Atrophy and mild chronic small-vessel white matter ischemic changes again noted. Vascular: Carotid atherosclerotic calcifications are noted. Skull: Sclerotic lesions within the visualized bony  structures again noted. Sinuses/Orbits: Small amount of fluid within the sphenoid sinus is again noted. Other: None. IMPRESSION: 1. No evidence of acute intracranial abnormality. 2. Atrophy and chronic small-vessel white matter ischemic changes. 3. Osseous metastatic disease again noted. Electronically Signed   By: Margarette Canada M.D.   On: 05/31/2020 14:41   CT HEAD WO CONTRAST  Result Date: 05/27/2020 CLINICAL DATA:  Right-sided headache with left-sided defects EXAM: CT HEAD WITHOUT CONTRAST TECHNIQUE: Contiguous axial images were obtained from the base of the skull through the vertex without intravenous contrast. COMPARISON:  PET-CT 01/24/2020 FINDINGS: Brain: No evidence of acute infarction, hemorrhage, hydrocephalus, extra-axial collection, visible mass lesion or mass effect. Diffuse prominence of the ventricles, cisterns and sulci compatible with moderate parenchymal volume loss. Incidental note made of a cavum septum pellucidum et vergae. Patchy areas of white matter hypoattenuation are most compatible with chronic microvascular angiopathy. Benign dural calcifications. Vascular: Atherosclerotic calcification of the carotid siphons and intradural vertebral arteries. No hyperdense vessel. Skull: No calvarial fracture. No scalp swelling or hematoma. Few sclerotic foci seen in the colitis and left occipital condyle as well as the anterior right parietal bone and occipital bone likely reflecting multifocal osseous metastatic disease in the setting of prostate carcinoma. Sinuses/Orbits: Paranasal sinuses and mastoid air cells are predominantly clear. Orbital structures are unremarkable aside from prior lens extractions. Other: None. IMPRESSION: 1. No acute intracranial findings. 2. Moderate parenchymal volume loss and chronic microvascular angiopathy. 3. Numerous sclerotic foci throughout the calvarium and skull base concerning for osseous metastases in the setting of prostate carcinoma. Electronically Signed    By: Lovena Le M.D.   On: 05/27/2020 01:47   MR Brain W and Wo Contrast  Result Date: 05/27/2020 CLINICAL DATA:  Mental status change. Prostate cancer with bone metastases. Diabetes and hypertension. EXAM: MRI HEAD WITHOUT AND WITH CONTRAST TECHNIQUE: Multiplanar, multiecho pulse sequences of the brain and surrounding structures were obtained without and with intravenous contrast. CONTRAST:  7.10m GADAVIST GADOBUTROL 1 MMOL/ML IV SOLN COMPARISON:  CT head without contrast 05/27/2020 FINDINGS: Brain: No acute infarct, hemorrhage, or mass lesion is present. Mild atrophy and moderate diffuse white matter disease is present. Focal cortical T2 hyperintensities in the high parietal lobe and posterior right middle frontal gyrus are compatible with small remote cortical infarcts. Asymmetric cortical thinning,  encephalomalacia, and volume loss is compatible with a remote right occipital lobe infarct. Remote left occipital lobe infarcts noted without volume loss. Cavum septum pellucidum incidentally noted. The internal auditory canals are within normal limits. The brainstem and cerebellum are within normal limits. Postcontrast images demonstrate no pathologic enhancement. The coronal postcontrast images are severely degraded by patient motion. Vascular: Flow is present in the major intracranial arteries. Skull and upper cervical spine: Adenoid hypertrophy noted. No discrete lesion present. Sclerotic skull lesions consistent with known metastases. Sinuses/Orbits: Polyp or mucous retention cyst is present the right maxillary sinus. The paranasal sinuses and mastoid air cells are otherwise clear. Bilateral lens replacements are noted. Globes and orbits are otherwise unremarkable. Bilateral exophthalmos noted. No orbital mass present. IMPRESSION: 1. No acute intracranial abnormality. 2. Mild atrophy and moderate diffuse white matter disease likely reflects the sequela of chronic microvascular ischemia. 3. Remote cortical  infarcts involving the high right parietal lobe and posterior right middle frontal gyrus. 4. Remote occipital lobe infarcts bilaterally. Volume loss is more noted on the right. 5. Sclerotic areas in the calvarium consistent with metastatic disease. Electronically Signed   By: San Morelle M.D.   On: 05/27/2020 11:39   ECHOCARDIOGRAM COMPLETE  Result Date: 05/27/2020    ECHOCARDIOGRAM REPORT   Patient Name:   COLTER MAGOWAN Date of Exam: 05/27/2020 Medical Rec #:  [id number]      Height:       72.0 in Accession #:    [id number]     Weight:       209.0 lb Date of Birth:  [birth year]      BSA:          2.171 m Patient Age:    69 years       BP:           153/77 mmHg Patient Gender: M              HR:           87 bpm. Exam Location:  [facility] Procedure: 2D Echo, Color Doppler, Cardiac Doppler and Strain Analysis Indications:     Stroke I63.9  History:         Patient has no prior history of Echocardiogram examinations.                  Risk Factors:Hypertension and Diabetes.  Sonographer:     Sherrie Sport RDCS (AE) Referring Phys:  [id number] Athena Masse Diagnosing Phys: Bartholome Bill MD  Sonographer Comments: Global longitudinal strain was attempted. IMPRESSIONS  1. Left ventricular ejection fraction, by estimation, is 65 to 70%. The left ventricle has normal function. The left ventricle has no regional wall motion abnormalities. There is mild left ventricular hypertrophy. Left ventricular diastolic parameters are consistent with Grade I diastolic dysfunction (impaired relaxation).  2. Right ventricular systolic function is normal. The right ventricular size is normal.  3. The mitral valve is grossly normal. Trivial mitral valve regurgitation.  4. The aortic valve is tricuspid. Aortic valve regurgitation is not visualized. FINDINGS  Left Ventricle: Left ventricular ejection fraction, by estimation, is 65 to 70%. The left ventricle has normal function. The left ventricle has no regional wall motion abnormalities. The  left ventricular internal cavity size was normal in size. There is  mild left ventricular hypertrophy. Left ventricular diastolic parameters are consistent with Grade I diastolic dysfunction (impaired relaxation). Right Ventricle: The right ventricular size is normal. No increase in right ventricular wall thickness. Right ventricular systolic  function is normal. Left Atrium: Left atrial size was normal in size. Right Atrium: Right atrial size was normal in size. Pericardium: There is no evidence of pericardial effusion. Mitral Valve: The mitral valve is grossly normal. Trivial mitral valve regurgitation. Tricuspid Valve: The tricuspid valve is grossly normal. Tricuspid valve regurgitation is mild. Aortic Valve: The aortic valve is tricuspid. Aortic valve regurgitation is not visualized. Aortic valve mean gradient measures 3.0 mmHg. Aortic valve peak gradient measures 4.4 mmHg. Aortic valve area, by VTI measures 3.34 cm. Pulmonic Valve: The pulmonic valve was grossly normal. Pulmonic valve regurgitation is mild. Aorta: The aortic root is normal in size and structure. IAS/Shunts: The interatrial septum was not well visualized.  LEFT VENTRICLE PLAX 2D LVIDd:         4.44 cm  Diastology LVIDs:         2.75 cm  LV e' medial:    4.79 cm/s LV PW:         1.13 cm  LV E/e' medial:  11.4 LV IVS:        1.07 cm  LV e' lateral:   9.36 cm/s LVOT diam:     2.20 cm  LV E/e' lateral: 5.9 LV SV:         70 LV SV Index:   32 LVOT Area:     3.80 cm                          3D Volume EF:                         3D EF:        53 %                         LV EDV:       111 ml                         LV ESV:       52 ml                         LV SV:        59 ml RIGHT VENTRICLE RV Basal diam:  3.72 cm RV S prime:     19.10 cm/s TAPSE (M-mode): 3.8 cm LEFT ATRIUM           Index       RIGHT ATRIUM           Index LA diam:      3.60 cm 1.66 cm/m  RA Area:     23.20 cm LA Vol (A2C): 47.8 ml 22.02 ml/m RA Volume:   77.20 ml  35.56 ml/m  LA Vol (A4C): 49.7 ml 22.89 ml/m  AORTIC VALVE                   PULMONIC VALVE AV Area (Vmax):    3.31 cm    PV Vmax:        0.90 m/s AV Area (Vmean):   3.32 cm    PV Peak grad:   3.2 mmHg AV Area (VTI):     3.34 cm    RVOT Peak grad: 6 mmHg AV Vmax:           105.00 cm/s AV Vmean:  75.000 cm/s AV VTI:            0.208 m AV Peak Grad:      4.4 mmHg AV Mean Grad:      3.0 mmHg LVOT Vmax:         91.40 cm/s LVOT Vmean:        65.500 cm/s LVOT VTI:          0.183 m LVOT/AV VTI ratio: 0.88  AORTA Ao Root diam: 3.35 cm MITRAL VALVE               TRICUSPID VALVE MV Area (PHT): 6.90 cm    TR Peak grad:   9.1 mmHg MV Decel Time: 110 msec    TR Vmax:        151.00 cm/s MV E velocity: 54.80 cm/s MV A velocity: 89.10 cm/s  SHUNTS MV E/A ratio:  0.62        Systemic VTI:  0.18 m                            Systemic Diam: 2.20 cm Bartholome Bill MD Electronically signed by Bartholome Bill MD Signature Date/Time: 05/27/2020/10:38:43 AM    Final      Assessment/Plan Hypertensive encephalopathy  Roxy Cedar presents with clinical syndrome, presently resolved, 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  We would lean towards the latter based on acute clinical presentation (altered mental status, new headache, uncertain of seizure activity), "two hit" phenomena (cancer, anti-cancer treatments) and mild but characteristic symmetric white matter T2 signal abnormality on presenting MRI from 05/27/20.  It's possible he had an unwitnessed seizure, given abrupt change while alone and common occurrence with PRES, but lack of corroborating history or objective findings limits our insight here.  At this time he is essentially at baseline, blood pressure and glucose are well controlled.  We will defer repeat MRI because of clinical improvement, lack of intervention.  If repeated, we would expect T2 signal abnormalities in poster white matter to resolve if this is indeed  PRES.  There is no clinical indication, based on this event, to alter or suspend any systemic therapy related to prostate cancer treatment.  Sporadic headaches can manage with PRN Tylenol, limit to less than daily use.    We spent twenty additional minutes teaching regarding the natural history, biology, and historical experience in the treatment of neurologic complications of cancer.   We appreciate the opportunity to participate in the care of ALESANDRO STUEVE.  He may return to clinic as needed, or with recurrence of symptoms.  All questions were answered. The patient knows to call the clinic with any problems, questions or concerns. No barriers to learning were detected.  The total time spent in the encounter was 40 minutes and more than 50% was on counseling and review of test results   Ventura Sellers, MD Medical Director of Neuro-Oncology [facility] [place] 06/13/20 9:56 AM

## 2020-06-13 NOTE — Progress Notes (Signed)
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   # NOV 2021- CTA [ER] Diffuse bilateral pulmonary nodules including a cavitary lesion in the right upper lobe measuring approximately 2 cm. There is extensive nodular pleural thickening bilaterally, greatest in the left lower lobe.  November 2021 PET scan-multiple lung lesions multiple bone lesions and prostate uptake.    # DM-2; NO COPD [?]; ACTIVE SMOKER 65ppd.  # DEC 2nd week- FIRMAGON; # JAN 12TH, 2022-ZYTIGA  1000 MG+ PRED 5MG; FEB MID 2022- ELIGARD q6M; MARCH 2022- DISCONTINUE ZYTIGA sec to PRESS [severe hypokalemia/mental status changes hospitalization];    # NGS/MOLECULAR TESTS: MARCH 2022-foundation 1 no targetable mutation./Genetic evaluation negative    # PALLIATIVE CARE EVALUATION:  # PAIN MANAGEMENT:    DIAGNOSIS: Prostate cancer  STAGE:   IV      ;  GOALS: Palliative  CURRENT/MOST RECENT THERAPY : ADT+ ZYTIGA    Prostate cancer metastatic to multiple sites [facility])  02/04/2020 Initial Diagnosis   Prostate cancer metastatic to multiple sites [facility])   03/19/2020 Cancer Staging   Staging form: Prostate, AJCC 8th Edition - Clinical: Stage IVB (pM1b) - Signed by Cammie Sickle, MD on 03/19/2020    Genetic Testing   Negative genetic testing. No pathogenic variants identified on the [facility] Multi-Cancer Panel. VUS in AXIN2 called c.1235A>C identified. The report date is 03/23/2020.   The Multi-Cancer Panel offered by [facility] includes sequencing and/or deletion duplication testing of the following 84 genes: AIP, ALK, APC, ATM, AXIN2,BAP1,  BARD1, BLM, BMPR1A, BRCA1, BRCA2, BRIP1, CASR, CDC73, CDH1, CDK4, CDKN1B, CDKN1C, CDKN2A (p14ARF),  CDKN2A (p16INK4a), CEBPA, CHEK2, CTNNA1, DICER1, DIS3L2, EGFR (c.2369C>T, p.Thr790Met variant only), EPCAM (Deletion/duplication testing only), FH, FLCN, GATA2, GPC3, GREM1 (Promoter region deletion/duplication testing only), HOXB13 (c.251G>A, p.Gly84Glu), HRAS, KIT, MAX, MEN1, MET, MITF (c.952G>A, p.Glu318Lys variant only), MLH1, MSH2, MSH3, MSH6, MUTYH, NBN, NF1, NF2, NTHL1, PALB2, PDGFRA, PHOX2B, PMS2, POLD1, POLE, POT1, PRKAR1A, PTCH1, PTEN, RAD50, RAD51C, RAD51D, RB1, RECQL4, RET, RUNX1, SDHAF2, SDHA (sequence changes only), SDHB, SDHC, SDHD, SMAD4, SMARCA4, SMARCB1, SMARCE1, STK11, SUFU, TERC, TERT, TMEM127, TP53, TSC1, TSC2, VHL, WRN and WT1.       HISTORY OF PRESENTING ILLNESS:  Brent Glass 80 y.o.  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  Patient Brent Glass was held a month ago because of severe hypokalemia.  Patient was recently admitted to hospital for mental status changes; noted to have significant elevated blood pressure.  Clinically suggestive of PRESS syndrome.  Patient currently feels much improved is back to his baseline mental status.  Currently blood pressures are running low-normal.  This morning patient was evaluated by neurology.   Patient denies any nausea vomiting appetite is good but no weight loss.  Patient complains of left ankle pain.  No trauma.  Review of Systems  Constitutional: Positive for weight loss. Negative for chills, diaphoresis, fever and malaise/fatigue.  HENT: Negative for nosebleeds and sore throat.   Eyes: Negative for double vision.  Respiratory: Positive for cough. Negative for hemoptysis, sputum production, shortness of breath and wheezing.   Cardiovascular: Negative for chest pain, palpitations, orthopnea and leg swelling.  Gastrointestinal: Negative for abdominal pain, blood in stool, constipation, diarrhea, heartburn,  melena, nausea and vomiting.  Genitourinary: Negative for  dysuria, frequency and urgency.  Musculoskeletal: Positive for back pain. Negative for joint pain.  Skin: Negative.  Negative for itching and rash.  Neurological: Negative for dizziness, tingling, focal weakness, weakness and headaches.  Endo/Heme/Allergies: Does not bruise/bleed easily.  Psychiatric/Behavioral: Negative for depression. The patient is not nervous/anxious and does not have insomnia.      MEDICAL HISTORY:  Past Medical History:  Diagnosis Date  . Arthritis   . BPH (benign prostatic hyperplasia)   . BPH with obstruction/lower urinary tract symptoms   . Diabetes ([place])   . Disorder resulting from impaired renal function   . Elevated PSA   . Family history of lung cancer   . Family history of lymphoma   . Heart murmur   . Hypertension   . Knee pain   . Lesion of lung   . Low HDL (under 40)   . Obesity   . Urinary hesitancy     SURGICAL HISTORY: Past Surgical History:  Procedure Laterality Date  . HERNIA REPAIR  1610   Umbilical Hernia Repair  . KNEE ARTHROSCOPY Left 04/10/2015   Procedure: ARTHROSCOPY KNEE, partial medial meniscectomy, partial synovectomy;  Surgeon: Hessie Knows, MD;  Location: [facility] ORS;  Service: Orthopedics;  Laterality: Left;    SOCIAL HISTORY: Social History   Socioeconomic History  . Marital status: Widowed    Spouse name: Not on file  . Number of children: Not on file  . Years of education: Not on file  . Highest education level: Not on file  Occupational History  . Not on file  Tobacco Use  . Smoking status: Current Every Day Smoker    Packs/day: 1.00    Types: Cigarettes  . Smokeless tobacco: Never Used  Vaping Use  . Vaping Use: Never used  Substance and Sexual Activity  . Alcohol use: No    Alcohol/week: 0.0 standard drinks  . Drug use: No  . Sexual activity: Not Currently  Other Topics Concern  . Not on file  Social History Narrative   Lives in [place]; self; daughter lives 1 mile. Smoke 1ppd > 65 years; stopped 25  years ago.  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    Social Determinants of Health   Financial Resource Strain: Not on file  Food Insecurity: Not on file  Transportation Needs: Not on file  Physical Activity: Not on file  Stress: Not on file  Social Connections: Not on file  Intimate Partner Violence: Not on file    FAMILY HISTORY: Family History  Problem Relation Age of Onset  . Diabetes Mother   . Lung cancer Mother   . Lymphoma Son 28       died in 19.   . Kidney disease Neg Hx   . Prostate cancer Neg Hx     ALLERGIES:  has No Known Allergies.  MEDICATIONS:  Current Outpatient Medications  Medication Sig Dispense Refill  . acetaminophen (TYLENOL) 650 MG CR tablet Take 650 mg by mouth every 8 (eight) hours as needed for pain.    Marland Kitchen albuterol (PROVENTIL HFA;VENTOLIN HFA) 108 (90 BASE) MCG/ACT inhaler Inhale 2 puffs into the lungs every 6 (six) hours as needed for wheezing or shortness of breath.    Marland Kitchen aspirin 81 MG tablet Take 81 mg by mouth daily.    . finasteride (PROSCAR) 5 MG tablet TAKE 1 TABLET BY MOUTH EVERY DAY (Patient taking differently: Take 5 mg by mouth daily.) 90  tablet 3  . lisinopril-hydrochlorothiazide (ZESTORETIC) 10-12.5 MG tablet Take 1 tablet by mouth daily.    . metFORMIN (GLUCOPHAGE) 1000 MG tablet Take 1,000 mg by mouth 2 (two) times daily with a meal.    . potassium chloride SA (KLOR-CON M20) 20 MEQ tablet 1 PILL TWICE A DAY 40 tablet 0  . simvastatin (ZOCOR) 40 MG tablet Take 40 mg by mouth daily.    Marland Kitchen thiamine 100 MG tablet Take 1 tablet (100 mg total) by mouth daily. 90 tablet 0  . traMADol (ULTRAM) 50 MG tablet TAKE 1 TABLET (50 MG TOTAL) BY MOUTH EVERY 6 (SIX) HOURS AS NEEDED FOR MODERATE PAIN OR SEVERE PAIN 30 tablet 0  . vitamin B-12 2000 MCG tablet Take 1 tablet (2,000 mcg total) by mouth daily. 90 tablet 1   No current facility-administered medications for this visit.      Marland Kitchen  PHYSICAL EXAMINATION: ECOG  PERFORMANCE STATUS: 0 - Asymptomatic  There were no vitals filed for this visit. There were no vitals filed for this visit.  Physical Exam Constitutional:      Comments: Ambulating independently.  Accompanied by his daughter.  HENT:     Head: Normocephalic and atraumatic.     Mouth/Throat:     Pharynx: No oropharyngeal exudate.  Eyes:     Pupils: Pupils are equal, round, and reactive to light.  Cardiovascular:     Rate and Rhythm: Normal rate and regular rhythm.  Pulmonary:     Effort: No respiratory distress.     Breath sounds: No wheezing.     Comments: Decreased breath sounds bilaterally. Abdominal:     General: Bowel sounds are normal. There is no distension.     Palpations: Abdomen is soft. There is no mass.     Tenderness: There is no abdominal tenderness. There is no guarding or rebound.  Musculoskeletal:        General: No tenderness. Normal range of motion.     Cervical back: Normal range of motion and neck supple.  Skin:    General: Skin is warm.  Neurological:     Mental Status: He is alert and oriented to person, place, and time.  Psychiatric:        Mood and Affect: Affect normal.      LABORATORY DATA:  I have reviewed the data as listed Lab Results  Component Value Date   WBC 10.7 (H) 06/13/2020   HGB 12.9 (L) 06/13/2020   HCT 38.7 (L) 06/13/2020   MCV 89.6 06/13/2020   PLT 320 06/13/2020   Recent Labs    05/27/20 0119 05/28/20 0553 05/31/20 1344 06/01/20 0450 06/13/20 0926  NA 138   < > 136 135 135  K 3.6   < > 3.4* 3.4* 4.1  CL 104   < > 103 103 101  CO2 23   < > 23 24 23   GLUCOSE 184*   < > 67* 85 279*  BUN 13   < > 12 11 28*  CREATININE 1.09   < > 1.00 1.00 1.27*  CALCIUM 9.8   < > 9.6 9.5 9.9  GFRNONAA >60   < > >60 >60 57*  PROT 7.5  --  6.6  --  7.1  ALBUMIN 4.0  --  3.4*  --  3.9  AST 14*  --  15  --  14*  ALT 10  --  9  --  10  ALKPHOS 122  --  94  --  95  BILITOT 0.6  --  0.5  --  0.3   < > = values in this interval not  displayed.    RADIOGRAPHIC STUDIES: I have personally reviewed the radiological images as listed and agreed with the findings in the report. DG Chest 2 View  Result Date: 05/31/2020 CLINICAL DATA:  Weakness. Increased confusion and headache. History of prostate cancer EXAM: CHEST - 2 VIEW COMPARISON:  05/27/2020 FINDINGS: Normal heart size. Aortic atherosclerosis. Decreased lung volumes. No superimposed pleural effusion or interstitial edema. No airspace densities. No acute osseous findings. IMPRESSION: Low lung volumes.  No acute abnormality Electronically Signed   By: Kerby Moors M.D.   On: 05/31/2020 14:36   DG Chest 2 View  Result Date: 05/27/2020 CLINICAL DATA:  80 year old male with altered mental status. EXAM: CHEST - 2 VIEW COMPARISON:  Chest radiograph dated 01/16/2020. FINDINGS: Left lung base linear atelectasis/scarring. No focal consolidation, pleural effusion, pneumothorax. Mild cardiomegaly. Atherosclerotic calcification of the aortic arch. No acute osseous pathology. IMPRESSION: No active cardiopulmonary disease. Electronically Signed   By: Anner Crete M.D.   On: 05/27/2020 02:46   CT Head Wo Contrast  Result Date: 05/31/2020 CLINICAL DATA:  80 year old male with headache and confusion. History of prostate cancer. EXAM: CT HEAD WITHOUT CONTRAST TECHNIQUE: Contiguous axial images were obtained from the base of the skull through the vertex without intravenous contrast. COMPARISON:  05/27/2020 and prior studies FINDINGS: Brain: No evidence of acute infarction, hemorrhage, hydrocephalus, extra-axial collection or mass lesion/mass effect. Atrophy and mild chronic small-vessel white matter ischemic changes again noted. Vascular: Carotid atherosclerotic calcifications are noted. Skull: Sclerotic lesions within the visualized bony structures again noted. Sinuses/Orbits: Small amount of fluid within the sphenoid sinus is again noted. Other: None. IMPRESSION: 1. No evidence of acute  intracranial abnormality. 2. Atrophy and chronic small-vessel white matter ischemic changes. 3. Osseous metastatic disease again noted. Electronically Signed   By: Margarette Canada M.D.   On: 05/31/2020 14:41   CT HEAD WO CONTRAST  Result Date: 05/27/2020 CLINICAL DATA:  Right-sided headache with left-sided defects EXAM: CT HEAD WITHOUT CONTRAST TECHNIQUE: Contiguous axial images were obtained from the base of the skull through the vertex without intravenous contrast. COMPARISON:  PET-CT 01/24/2020 FINDINGS: Brain: No evidence of acute infarction, hemorrhage, hydrocephalus, extra-axial collection, visible mass lesion or mass effect. Diffuse prominence of the ventricles, cisterns and sulci compatible with moderate parenchymal volume loss. Incidental note made of a cavum septum pellucidum et vergae. Patchy areas of white matter hypoattenuation are most compatible with chronic microvascular angiopathy. Benign dural calcifications. Vascular: Atherosclerotic calcification of the carotid siphons and intradural vertebral arteries. No hyperdense vessel. Skull: No calvarial fracture. No scalp swelling or hematoma. Few sclerotic foci seen in the colitis and left occipital condyle as well as the anterior right parietal bone and occipital bone likely reflecting multifocal osseous metastatic disease in the setting of prostate carcinoma. Sinuses/Orbits: Paranasal sinuses and mastoid air cells are predominantly clear. Orbital structures are unremarkable aside from prior lens extractions. Other: None. IMPRESSION: 1. No acute intracranial findings. 2. Moderate parenchymal volume loss and chronic microvascular angiopathy. 3. Numerous sclerotic foci throughout the calvarium and skull base concerning for osseous metastases in the setting of prostate carcinoma. Electronically Signed   By: Lovena Le M.D.   On: 05/27/2020 01:47   MR Brain W and Wo Contrast  Result Date: 05/27/2020 CLINICAL DATA:  Mental status change. Prostate  cancer with bone metastases. Diabetes and hypertension. EXAM: MRI HEAD WITHOUT AND WITH CONTRAST TECHNIQUE:  Multiplanar, multiecho pulse sequences of the brain and surrounding structures were obtained without and with intravenous contrast. CONTRAST:  7.23m GADAVIST GADOBUTROL 1 MMOL/ML IV SOLN COMPARISON:  CT head without contrast 05/27/2020 FINDINGS: Brain: No acute infarct, hemorrhage, or mass lesion is present. Mild atrophy and moderate diffuse white matter disease is present. Focal cortical T2 hyperintensities in the high parietal lobe and posterior right middle frontal gyrus are compatible with small remote cortical infarcts. Asymmetric cortical thinning, encephalomalacia, and volume loss is compatible with a remote right occipital lobe infarct. Remote left occipital lobe infarcts noted without volume loss. Cavum septum pellucidum incidentally noted. The internal auditory canals are within normal limits. The brainstem and cerebellum are within normal limits. Postcontrast images demonstrate no pathologic enhancement. The coronal postcontrast images are severely degraded by patient motion. Vascular: Flow is present in the major intracranial arteries. Skull and upper cervical spine: Adenoid hypertrophy noted. No discrete lesion present. Sclerotic skull lesions consistent with known metastases. Sinuses/Orbits: Polyp or mucous retention cyst is present the right maxillary sinus. The paranasal sinuses and mastoid air cells are otherwise clear. Bilateral lens replacements are noted. Globes and orbits are otherwise unremarkable. Bilateral exophthalmos noted. No orbital mass present. IMPRESSION: 1. No acute intracranial abnormality. 2. Mild atrophy and moderate diffuse white matter disease likely reflects the sequela of chronic microvascular ischemia. 3. 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 4. Remote occipital lobe infarcts bilaterally. Volume loss is  more noted on the right. 5. Sclerotic areas in the calvarium consistent with metastatic disease. Electronically Signed   By: CSan MorelleM.D.   On: 05/27/2020 11:39   ECHOCARDIOGRAM COMPLETE  Result Date: 05/27/2020    ECHOCARDIOGRAM REPORT   Patient Name:   BEARSEL SHOUSEDate of Exam: 05/27/2020 Medical Rec #:  [id number]     Height:       72.0 in Accession #:    [id number]    Weight:       209.0 lb Date of Birth:  [birth year]     BSA:          2.171 m Patient Age:    89years       BP:           153/77 mmHg Patient Gender: M              HR:           87 bpm. Exam Location:  [facility] Procedure: 2D Echo, Color Doppler, Cardiac Doppler and Strain Analysis Indications:     Stroke I63.9  History:         Patient has no prior history of Echocardiogram examinations.                  Risk Factors:Hypertension and Diabetes.  Sonographer:     JSherrie SportRDCS (AE) Referring Phys:  [id number] MasseDiagnosing Phys: KBartholome BillMD  Sonographer Comments: Global longitudinal strain was attempted. IMPRESSIONS  1. Left ventricular ejection fraction, by estimation, is 65 to 70%. The left ventricle has normal function. The left ventricle has no regional wall motion abnormalities. There is mild left ventricular hypertrophy. Left ventricular diastolic parameters are consistent with Grade I diastolic dysfunction (impaired relaxation).  2. Right ventricular systolic function is normal. The right ventricular size is normal.  3. The mitral valve is grossly normal. Trivial mitral valve regurgitation.  4. The aortic valve is tricuspid. Aortic valve regurgitation is not  visualized. FINDINGS  Left Ventricle: Left ventricular ejection fraction, by estimation, is 65 to 70%. The left ventricle has normal function. The left ventricle has no regional wall motion abnormalities. The left ventricular internal cavity size was normal in size. There is  mild left ventricular hypertrophy. Left ventricular diastolic parameters are  consistent with Grade I diastolic dysfunction (impaired relaxation). Right Ventricle: The right ventricular size is normal. No increase in right ventricular wall thickness. Right ventricular systolic function is normal. Left Atrium: Left atrial size was normal in size. Right Atrium: Right atrial size was normal in size. Pericardium: There is no evidence of pericardial effusion. Mitral Valve: The mitral valve is grossly normal. Trivial mitral valve regurgitation. Tricuspid Valve: The tricuspid valve is grossly normal. Tricuspid valve regurgitation is mild. Aortic Valve: The aortic valve is tricuspid. Aortic valve regurgitation is not visualized. Aortic valve mean gradient measures 3.0 mmHg. Aortic valve peak gradient measures 4.4 mmHg. Aortic valve area, by VTI measures 3.34 cm. Pulmonic Valve: The pulmonic valve was grossly normal. Pulmonic valve regurgitation is mild. Aorta: The aortic root is normal in size and structure. IAS/Shunts: The interatrial septum was not well visualized.  LEFT VENTRICLE PLAX 2D LVIDd:         4.44 cm  Diastology LVIDs:         2.75 cm  LV e' medial:    4.79 cm/s LV PW:         1.13 cm  LV E/e' medial:  11.4 LV IVS:        1.07 cm  LV e' lateral:   9.36 cm/s LVOT diam:     2.20 cm  LV E/e' lateral: 5.9 LV SV:         70 LV SV Index:   32 LVOT Area:     3.80 cm                          3D Volume EF:                         3D EF:        53 %                         LV EDV:       111 ml                         LV ESV:       52 ml                         LV SV:        59 ml RIGHT VENTRICLE RV Basal diam:  3.72 cm RV S prime:     19.10 cm/s TAPSE (M-mode): 3.8 cm LEFT ATRIUM           Index       RIGHT ATRIUM           Index LA diam:      3.60 cm 1.66 cm/m  RA Area:     23.20 cm LA Vol (A2C): 47.8 ml 22.02 ml/m RA Volume:   77.20 ml  35.56 ml/m LA Vol (A4C): 49.7 ml 22.89 ml/m  AORTIC VALVE                   PULMONIC VALVE AV Area (Vmax):  3.31 cm    PV Vmax:        0.90 m/s AV Area  (Vmean):   3.32 cm    PV Peak grad:   3.2 mmHg AV Area (VTI):     3.34 cm    RVOT Peak grad: 6 mmHg AV Vmax:           105.00 cm/s AV Vmean:          75.000 cm/s AV VTI:            0.208 m AV Peak Grad:      4.4 mmHg AV Mean Grad:      3.0 mmHg LVOT Vmax:         91.40 cm/s LVOT Vmean:        65.500 cm/s LVOT VTI:          0.183 m LVOT/AV VTI ratio: 0.88  AORTA Ao Root diam: 3.35 cm MITRAL VALVE               TRICUSPID VALVE MV Area (PHT): 6.90 cm    TR Peak grad:   9.1 mmHg MV Decel Time: 110 msec    TR Vmax:        151.00 cm/s MV E velocity: 54.80 cm/s MV A velocity: 89.10 cm/s  SHUNTS MV E/A ratio:  0.62        Systemic VTI:  0.18 m                            Systemic Diam: 2.20 cm Bartholome Bill MD Electronically signed by Bartholome Bill MD Signature Date/Time: 05/27/2020/10:38:43 AM    Final     ASSESSMENT & PLAN:   Prostate cancer metastatic to multiple sites [facility]) #Clinically-prostate cancer castrate sensitive metastatic to lung/bone [NOV PSA 900+; no biopsy].  PROSTATE ADENOCARCINOMA- castrate sensitive; November 2021 PET scan-multiple lung lesions multiple bone lesions and prostate uptake.  STABLE; clinically.   #Discontinue Zytiga plus prednisone-given recent PRESS syndrome/severe hypokalemia].  Plan Xtandi-monitor blood pressures closely on Xtandi.   # Severe hypokalemia 2.5-secondary to Zytiga; potassium improved.  Zytiga discontinued.  #Status changes/PRESS syndrome-reviewed MRI; discussed with Dr.Vaslow.  Hypokalemia could have triggered PRESS syndrome.  Mental status is currently resolved.  # Left ankle pain-no obvious trauma noted.  Likely musculoskeletal.  Monitor for now.  If worse recommend x-rays.  #Chest tenderness-grade 1-2.  Stable sec to ADT- continue tramadol prn; add vaseline BID.   # DISPOSITION: # Follow up in 4 weeks MD- cbc/cmp- Dr.B    All questions were answered. The patient knows to call the clinic with any problems, questions or concerns.    Cammie Sickle, MD 06/17/2020 10:29 PM

## 2020-06-16 NOTE — Telephone Encounter (Signed)
Oral Oncology Patient Advocate Encounter  Received notification from [facility] Patient Assistance program that patient has been successfully enrolled into their program to receive Xtandi from the manufacturer at $0 out of pocket until 03/07/21.    I will call the patient and let him know of the approval and when he will need to reapply for next year.   Specialty Pharmacy that will dispense medication is [facility].  Patient knows to call the office with questions or concerns.   Oral Oncology Clinic will continue to follow.  [place] [facility] [place] Phone [phone] Fax [phone] 06/16/2020 12:12 PM

## 2020-06-18 MED ORDER — ENZALUTAMIDE 40 MG PO TABS: 120.0000 mg | ORAL_TABLET | Freq: Every day | ORAL | 3 refills | Status: DC

## 2020-06-18 NOTE — Telephone Encounter (Signed)
Called to let patients daughter know that once the [name] is delivered they should hold on getting started until Dr. Rogue Bussing give them the go ahead to get started. [name] stated her understanding.

## 2020-06-23 ENCOUNTER — Other Ambulatory Visit: Payer: Self-pay | Admitting: Internal Medicine

## 2020-06-23 NOTE — Telephone Encounter (Signed)
  Component Ref Range & Units 10 d ago  (06/13/20) 3 wk ago  (06/01/20) 3 wk ago  (05/31/20) 3 wk ago  (05/28/20) 3 wk ago  (05/27/20) 1 mo ago  (05/14/20) 2 mo ago  (04/16/20)  Potassium 3.5 - 5.1 mmol/L 4.1  3.4Low  3.4Low

## 2020-07-11 ENCOUNTER — Encounter: Payer: Self-pay | Admitting: Internal Medicine

## 2020-07-11 ENCOUNTER — Inpatient Hospital Stay (HOSPITAL_BASED_OUTPATIENT_CLINIC_OR_DEPARTMENT_OTHER): Payer: Medicare Other | Admitting: Internal Medicine

## 2020-07-11 ENCOUNTER — Inpatient Hospital Stay: Payer: Medicare Other | Attending: Internal Medicine

## 2020-07-11 ENCOUNTER — Encounter: Payer: Self-pay | Admitting: Pharmacist

## 2020-07-11 DIAGNOSIS — C7951 Secondary malignant neoplasm of bone: Secondary | ICD-10-CM | POA: Insufficient documentation

## 2020-07-11 DIAGNOSIS — C61 Malignant neoplasm of prostate: Secondary | ICD-10-CM | POA: Diagnosis not present

## 2020-07-11 DIAGNOSIS — C7800 Secondary malignant neoplasm of unspecified lung: Secondary | ICD-10-CM | POA: Insufficient documentation

## 2020-07-11 DIAGNOSIS — Z79899 Other long term (current) drug therapy: Secondary | ICD-10-CM | POA: Diagnosis not present

## 2020-07-11 DIAGNOSIS — Z7982 Long term (current) use of aspirin: Secondary | ICD-10-CM | POA: Insufficient documentation

## 2020-07-11 DIAGNOSIS — F17210 Nicotine dependence, cigarettes, uncomplicated: Secondary | ICD-10-CM | POA: Diagnosis not present

## 2020-07-11 DIAGNOSIS — E876 Hypokalemia: Secondary | ICD-10-CM | POA: Insufficient documentation

## 2020-07-11 DIAGNOSIS — Z7984 Long term (current) use of oral hypoglycemic drugs: Secondary | ICD-10-CM | POA: Insufficient documentation

## 2020-07-11 LAB — CBC WITH DIFFERENTIAL/PLATELET
Abs Immature Granulocytes: 0.03 10*3/uL (ref 0.00–0.07)
Basophils Absolute: 0.1 10*3/uL (ref 0.0–0.1)
Basophils Relative: 1 %
Eosinophils Absolute: 0.3 10*3/uL (ref 0.0–0.5)
Eosinophils Relative: 3 %
HCT: 37.1 % — ABNORMAL LOW (ref 39.0–52.0)
Hemoglobin: 12.3 g/dL — ABNORMAL LOW (ref 13.0–17.0)
Immature Granulocytes: 0 %
Lymphocytes Relative: 38 %
Lymphs Abs: 3.8 10*3/uL (ref 0.7–4.0)
MCH: 29.1 pg (ref 26.0–34.0)
MCHC: 33.2 g/dL (ref 30.0–36.0)
MCV: 87.9 fL (ref 80.0–100.0)
Monocytes Absolute: 0.7 10*3/uL (ref 0.1–1.0)
Monocytes Relative: 7 %
Neutro Abs: 5.3 10*3/uL (ref 1.7–7.7)
Neutrophils Relative %: 51 %
Platelets: 322 10*3/uL (ref 150–400)
RBC: 4.22 MIL/uL (ref 4.22–5.81)
RDW: 13.7 % (ref 11.5–15.5)
WBC: 10.1 10*3/uL (ref 4.0–10.5)
nRBC: 0 % (ref 0.0–0.2)

## 2020-07-11 LAB — COMPREHENSIVE METABOLIC PANEL
ALT: 10 U/L (ref 0–44)
AST: 16 U/L (ref 15–41)
Albumin: 3.8 g/dL (ref 3.5–5.0)
Alkaline Phosphatase: 97 U/L (ref 38–126)
Anion gap: 11 (ref 5–15)
BUN: 16 mg/dL (ref 8–23)
CO2: 23 mmol/L (ref 22–32)
Calcium: 10 mg/dL (ref 8.9–10.3)
Chloride: 102 mmol/L (ref 98–111)
Creatinine, Ser: 0.94 mg/dL (ref 0.61–1.24)
GFR, Estimated: >60 mL/min (ref >60)
Glucose, Bld: 210 mg/dL — ABNORMAL HIGH (ref 70–99)
Potassium: 4.4 mmol/L (ref 3.5–5.1)
Sodium: 136 mmol/L (ref 135–145)
Total Bilirubin: 0.5 mg/dL (ref 0.3–1.2)
Total Protein: 7.1 g/dL (ref 6.5–8.1)

## 2020-07-11 LAB — PSA: Prostatic Specific Antigen: 0.68 ng/mL (ref 0.00–4.00)

## 2020-07-11 NOTE — Assessment & Plan Note (Addendum)
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  PROSTATE ADENOCARCINOMA- castrate sensitive; November 2021 PET scan-multiple lung lesions multiple bone lesions and prostate uptake.  STABLE; clinically.   # plan to start X-tandi 120 mg/day.  Again reviewed the potential side effects including but not limited to hot flashes; elevated blood pressure risk of seizures.  Discussed with pharmacy.  # vision changes-clinically not prostate cancer.  Await ophthalmology evaluation.  #Chest tenderness-grade 1-2.  Stable sec to ADT- continue tramadol prn; add vaseline BID.   * Eligard-Aug 2022 # DISPOSITION: # Follow up in 4 weeks MD- cbc/cmp/PSA- Dr.B

## 2020-07-11 NOTE — Progress Notes (Signed)
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   # NOV 2021- CTA [ER] Diffuse bilateral pulmonary nodules including a cavitary lesion in the right upper lobe measuring approximately 2 cm. There is extensive nodular pleural thickening bilaterally, greatest in the left lower lobe.  November 2021 PET scan-multiple lung lesions multiple bone lesions and prostate uptake.    # DM-2; NO COPD [?]; ACTIVE SMOKER 65ppd.  # DEC 2nd week- FIRMAGON; # JAN 12TH, 2022-ZYTIGA  1000 MG+ PRED 5MG; FEB MID 2022- ELIGARD q6M; MARCH 2022- DISCONTINUE ZYTIGA sec to PRESS [severe hypokalemia/mental status changes hospitalization];  # MAY 6th, 2022- XTnadi 3pills/day   # NGS/MOLECULAR TESTS: MARCH 2022-foundation 1 no targetable mutation./Genetic evaluation negative    # PALLIATIVE CARE EVALUATION:  # PAIN MANAGEMENT:    DIAGNOSIS: Prostate cancer  STAGE:   IV      ;  GOALS: Palliative  CURRENT/MOST RECENT THERAPY : ADT+ Xtandi    Prostate cancer metastatic to multiple sites [facility])  02/04/2020 Initial Diagnosis   Prostate cancer metastatic to multiple sites [facility])   03/19/2020 Cancer Staging   Staging form: Prostate, AJCC 8th Edition - Clinical: Stage IVB (pM1b) - Signed by Cammie Sickle, MD on 03/19/2020    Genetic Testing   Negative genetic testing. No pathogenic variants identified on the [facility] Multi-Cancer Panel. VUS in AXIN2 called c.1235A>C identified. The report date is 03/23/2020.   The Multi-Cancer Panel offered by [facility] includes sequencing and/or deletion duplication testing of the following 84 genes: AIP, ALK, APC, ATM, AXIN2,BAP1,  BARD1, BLM, BMPR1A, BRCA1, BRCA2, BRIP1, CASR, CDC73, CDH1,  CDK4, CDKN1B, CDKN1C, CDKN2A (p14ARF), CDKN2A (p16INK4a), CEBPA, CHEK2, CTNNA1, DICER1, DIS3L2, EGFR (c.2369C>T, p.Thr790Met variant only), EPCAM (Deletion/duplication testing only), FH, FLCN, GATA2, GPC3, GREM1 (Promoter region deletion/duplication testing only), HOXB13 (c.251G>A, p.Gly84Glu), HRAS, KIT, MAX, MEN1, MET, MITF (c.952G>A, p.Glu318Lys variant only), MLH1, MSH2, MSH3, MSH6, MUTYH, NBN, NF1, NF2, NTHL1, PALB2, PDGFRA, PHOX2B, PMS2, POLD1, POLE, POT1, PRKAR1A, PTCH1, PTEN, RAD50, RAD51C, RAD51D, RB1, RECQL4, RET, RUNX1, SDHAF2, SDHA (sequence changes only), SDHB, SDHC, SDHD, SMAD4, SMARCA4, SMARCB1, SMARCE1, STK11, SUFU, TERC, TERT, TMEM127, TP53, TSC1, TSC2, VHL, WRN and WT1.       HISTORY OF PRESENTING ILLNESS:  Brent Glass 80 y.o.  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    Patient's Zytiga plus prednisone was discontinued-approximately 2 months ago because of severe hypokalemia/PRESS syndrome.  Patient feels much improved.  He is back to his baseline health.  Patient denies any nausea vomiting but denies any headaches.  Does complain of worsening vision acuity; awaiting ophthalmology evaluation.  Review of Systems  Constitutional: Positive for weight loss. Negative for chills, diaphoresis, fever and malaise/fatigue.  HENT: Negative for nosebleeds and sore throat.   Eyes: Negative for double vision.  Respiratory: Positive for cough. Negative for hemoptysis, sputum production, shortness of breath and wheezing.   Cardiovascular: Negative for chest pain, palpitations, orthopnea and leg swelling.  Gastrointestinal: Negative for abdominal pain, blood in stool, constipation, diarrhea, heartburn, melena, nausea and vomiting.  Genitourinary: Negative for dysuria, frequency and urgency.  Musculoskeletal: Positive for back pain. Negative for joint pain.  Skin: Negative.  Negative for itching and rash.  Neurological: Negative for dizziness, tingling, focal weakness, weakness and headaches.  Endo/Heme/Allergies: Does not bruise/bleed easily.  Psychiatric/Behavioral: Negative for depression. The patient is not nervous/anxious and does not have insomnia.      MEDICAL HISTORY:  Past Medical History:  Diagnosis Date  . Arthritis   . BPH (benign prostatic hyperplasia)   . BPH with obstruction/lower urinary tract symptoms   . Diabetes ([place])   . Disorder resulting from impaired renal function   . Elevated PSA   . Family history of lung cancer   . Family history of lymphoma   . Heart murmur   . Hypertension   . Knee pain   . Lesion of lung   . Low HDL (under 40)   . Obesity   . Urinary hesitancy     SURGICAL HISTORY: Past Surgical History:  Procedure Laterality Date  . HERNIA REPAIR  8921   Umbilical Hernia Repair  . KNEE ARTHROSCOPY Left 04/10/2015   Procedure: ARTHROSCOPY KNEE, partial medial meniscectomy, partial synovectomy;  Surgeon: Hessie Knows, MD;  Location: [facility] ORS;  Service: Orthopedics;  Laterality: Left;    SOCIAL HISTORY: Social History   Socioeconomic History  . Marital status: Widowed    Spouse name: Not on file  . Number of children: Not on file  . Years of education: Not on file  . Highest education level: Not on file  Occupational History  . Not on file  Tobacco Use  . Smoking status: Current Every Day Smoker    Packs/day: 1.00    Types: Cigarettes  . Smokeless tobacco: Never Used  Vaping Use  . Vaping Use: Never used  Substance and Sexual Activity  . Alcohol use: No    Alcohol/week: 0.0 standard drinks  . Drug use: No  . Sexual activity: Not Currently  Other Topics Concern  . Not on file  Social History Narrative   Lives in [place]; self; daughter lives 1 mile. Smoke 1ppd > 65 years; stopped 25 years ago.  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    Social Determinants of Health   Financial Resource Strain: Not on  file  Food Insecurity: Not on file  Transportation Needs: Not on file  Physical Activity: Not on file  Stress: Not on file  Social Connections: Not on file  Intimate Partner Violence: Not on file    FAMILY HISTORY: Family History  Problem Relation Age of Onset  . Diabetes Mother   . Lung cancer Mother   . Lymphoma Son 28       died in 49.   . Kidney disease Neg Hx   . Prostate cancer Neg Hx     ALLERGIES:  has No Known Allergies.  MEDICATIONS:  Current Outpatient Medications  Medication Sig Dispense Refill  . acetaminophen (TYLENOL) 650 MG CR tablet Take 650 mg by mouth every 8 (eight) hours as needed for pain.    Marland Kitchen albuterol (PROVENTIL HFA;VENTOLIN HFA) 108 (90 BASE) MCG/ACT inhaler Inhale 2 puffs into the lungs every 6 (six) hours as needed for wheezing or shortness of breath.    Marland Kitchen aspirin 81 MG tablet Take 81 mg by mouth daily.    . enzalutamide (XTANDI) 40 MG tablet Take 3 tablets (120 mg total) by mouth daily. 120 tablet 3  . finasteride (PROSCAR) 5 MG tablet TAKE 1 TABLET BY MOUTH EVERY DAY (Patient taking differently: Take 5 mg by mouth daily.) 90 tablet 3  . lisinopril (ZESTRIL) 10 MG tablet Take 1 tablet by mouth  daily.    . metFORMIN (GLUCOPHAGE) 1000 MG tablet Take 1,000 mg by mouth 2 (two) times daily with a meal.    . potassium chloride SA (KLOR-CON M20) 20 MEQ tablet 1 PILL TWICE A DAY 40 tablet 0  . simvastatin (ZOCOR) 40 MG tablet Take 40 mg by mouth daily.    Marland Kitchen thiamine 100 MG tablet Take 1 tablet (100 mg total) by mouth daily. 90 tablet 0  . traMADol (ULTRAM) 50 MG tablet TAKE 1 TABLET (50 MG TOTAL) BY MOUTH EVERY 6 (SIX) HOURS AS NEEDED FOR MODERATE PAIN OR SEVERE PAIN 30 tablet 0  . vitamin B-12 2000 MCG tablet Take 1 tablet (2,000 mcg total) by mouth daily. 90 tablet 1   No current facility-administered medications for this visit.      Marland Kitchen  PHYSICAL EXAMINATION: ECOG PERFORMANCE STATUS: 0 - Asymptomatic  Vitals:   07/11/20 1032 07/11/20 1035  BP:  102/61   Pulse: 84   Resp: 16   Temp: 98.1 F (36.7 C) 98.1 F (36.7 C)  SpO2: 99%    Filed Weights   07/11/20 1032  Weight: 206 lb (93.4 kg)    Physical Exam Constitutional:      Comments: Ambulating independently.  Accompanied by his daughter.  HENT:     Head: Normocephalic and atraumatic.     Mouth/Throat:     Pharynx: No oropharyngeal exudate.  Eyes:     Pupils: Pupils are equal, round, and reactive to light.  Cardiovascular:     Rate and Rhythm: Normal rate and regular rhythm.  Pulmonary:     Effort: No respiratory distress.     Breath sounds: No wheezing.     Comments: Decreased breath sounds bilaterally. Abdominal:     General: Bowel sounds are normal. There is no distension.     Palpations: Abdomen is soft. There is no mass.     Tenderness: There is no abdominal tenderness. There is no guarding or rebound.  Musculoskeletal:        General: No tenderness. Normal range of motion.     Cervical back: Normal range of motion and neck supple.  Skin:    General: Skin is warm.  Neurological:     Mental Status: He is alert and oriented to person, place, and time.  Psychiatric:        Mood and Affect: Affect normal.      LABORATORY DATA:  I have reviewed the data as listed Lab Results  Component Value Date   WBC 10.1 07/11/2020   HGB 12.3 (L) 07/11/2020   HCT 37.1 (L) 07/11/2020   MCV 87.9 07/11/2020   PLT 322 07/11/2020   Recent Labs    05/31/20 1344 06/01/20 0450 06/13/20 0926 07/11/20 1013  NA 136 135 135 136  K 3.4* 3.4* 4.1 4.4  CL 103 103 101 102  CO2 23 24 23 23   GLUCOSE 67* 85 279* 210*  BUN 12 11 28* 16  CREATININE 1.00 1.00 1.27* 0.94  CALCIUM 9.6 9.5 9.9 10.0  GFRNONAA >60 >60 57* >60  PROT 6.6  --  7.1 7.1  ALBUMIN 3.4*  --  3.9 3.8  AST 15  --  14* 16  ALT 9  --  10 10  ALKPHOS 94  --  95 97  BILITOT 0.5  --  0.3 0.5    RADIOGRAPHIC STUDIES: I have personally reviewed the radiological images as listed and agreed with the  findings in the report. No results found.  ASSESSMENT &  PLAN:   Prostate cancer metastatic to multiple sites [facility]) #Clinically-prostate cancer castrate sensitive metastatic to lung/bone [NOV PSA 900+; no biopsy].  PROSTATE ADENOCARCINOMA- castrate sensitive; November 2021 PET scan-multiple lung lesions multiple bone lesions and prostate uptake.  STABLE; clinically.   # plan to start X-tandi 120 mg/day.  Again reviewed the potential side effects including but not limited to hot flashes; elevated blood pressure risk of seizures.  Discussed with pharmacy.  # vision changes-clinically not prostate cancer.  Await ophthalmology evaluation.  #Chest tenderness-grade 1-2.  Stable sec to ADT- continue tramadol prn; add vaseline BID.   * Eligard-Aug 2022 # DISPOSITION: # Follow up in 4 weeks MD- cbc/cmp/PSA- Dr.B    All questions were answered. The patient knows to call the clinic with any problems, questions or concerns.    Cammie Sickle, MD 07/14/2020 7:52 AM

## 2020-07-11 NOTE — Progress Notes (Signed)
[place]  [phone] [phone]  Patient Care Team: Donnie Coffin, [name], RN as Oncology Nurse Navigator   Name of the patient: Brent Glass  [id number]  1940/07/15   Date of visit: 07/11/20  HPI: Patient is a 80 y.o. male with castration sensitive metastatic prostate. Planned treatment of Xtandi (enzalutamide) and ADT. Patient is transitioning from abiraterone due to severe hypokalemia.  Reason for Consult: Xtandi (enzalutamide) oral chemotherapy education.   PAST MEDICAL HISTORY: Past Medical History:  Diagnosis Date  . Arthritis   . BPH (benign prostatic hyperplasia)   . BPH with obstruction/lower urinary tract symptoms   . Diabetes ([place])   . Disorder resulting from impaired renal function   . Elevated PSA   . Family history of lung cancer   . Family history of lymphoma   . Heart murmur   . Hypertension   . Knee pain   . Lesion of lung   . Low HDL (under 40)   . Obesity   . 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   # NOV 2021- CTA [ER] Diffuse bilateral pulmonary nodules including a cavitary lesion in the right upper lobe measuring approximately 2 cm. There is extensive nodular pleural thickening bilaterally, greatest in the left lower lobe.  November 2021 PET scan-multiple lung lesions multiple bone lesions and prostate uptake.    # DM-2; NO COPD [?]; ACTIVE SMOKER 65ppd.  # DEC 2nd week- FIRMAGON; # JAN 12TH, 2022-ZYTIGA  1000 MG+ PRED 5MG; FEB MID 2022- ELIGARD q6M; MARCH 2022- DISCONTINUE ZYTIGA sec to PRESS [severe hypokalemia/mental status changes hospitalization];  # MAY 6th, 2022- XTnadi 3pills/day   # NGS/MOLECULAR TESTS: MARCH 2022-foundation 1 no targetable mutation./Genetic evaluation negative    # PALLIATIVE  CARE EVALUATION:  # PAIN MANAGEMENT:    DIAGNOSIS: Prostate cancer  STAGE:   IV      ;  GOALS: Palliative  CURRENT/MOST RECENT THERAPY : ADT+ ZYTIGA    Prostate cancer metastatic to multiple sites [facility])  02/04/2020 Initial Diagnosis   Prostate cancer metastatic to multiple sites [facility])   03/19/2020 Cancer Staging   Staging form: Prostate, AJCC 8th Edition - Clinical: Stage IVB (pM1b) - Signed by Cammie Sickle, MD on 03/19/2020    Genetic Testing   Negative genetic testing. No pathogenic variants identified on the [facility] Multi-Cancer Panel. VUS in AXIN2 called c.1235A>C identified. The report date is 03/23/2020.   The Multi-Cancer Panel offered by [facility] includes sequencing and/or deletion duplication testing of the following 84 genes: AIP, ALK, APC, ATM, AXIN2,BAP1,  BARD1, BLM, BMPR1A, BRCA1, BRCA2, BRIP1, CASR, CDC73, CDH1, CDK4, CDKN1B, CDKN1C, CDKN2A (p14ARF), CDKN2A (p16INK4a), CEBPA, CHEK2, CTNNA1, DICER1, DIS3L2, EGFR (c.2369C>T, p.Thr790Met variant only), EPCAM (Deletion/duplication testing only), FH, FLCN, GATA2, GPC3, GREM1 (Promoter region deletion/duplication testing only), HOXB13 (c.251G>A, p.Gly84Glu), HRAS, KIT, MAX, MEN1, MET, MITF (c.952G>A, p.Glu318Lys variant only), MLH1, MSH2, MSH3, MSH6, MUTYH, NBN, NF1, NF2, NTHL1, PALB2, PDGFRA, PHOX2B, PMS2, POLD1, POLE, POT1, PRKAR1A, PTCH1, PTEN, RAD50, RAD51C, RAD51D, RB1, RECQL4, RET, RUNX1, SDHAF2, SDHA (sequence changes only), SDHB, SDHC, SDHD, SMAD4, SMARCA4, SMARCB1, SMARCE1, STK11, SUFU, TERC, TERT, TMEM127, TP53, TSC1, TSC2, VHL, WRN and WT1.      ALLERGIES:  has No Known Allergies.  MEDICATIONS:  Current Outpatient Medications  Medication Sig Dispense Refill  . acetaminophen (  TYLENOL) 650 MG CR tablet Take 650 mg by mouth every 8 (eight) hours as needed for pain.    Marland Kitchen albuterol (PROVENTIL HFA;VENTOLIN HFA) 108 (90 BASE) MCG/ACT inhaler Inhale 2 puffs into the lungs every 6 (six) hours as needed for wheezing or  shortness of breath.    Marland Kitchen aspirin 81 MG tablet Take 81 mg by mouth daily.    . enzalutamide (XTANDI) 40 MG tablet Take 3 tablets (120 mg total) by mouth daily. 120 tablet 3  . finasteride (PROSCAR) 5 MG tablet TAKE 1 TABLET BY MOUTH EVERY DAY (Patient taking differently: Take 5 mg by mouth daily.) 90 tablet 3  . lisinopril (ZESTRIL) 10 MG tablet Take 1 tablet by mouth daily.    . metFORMIN (GLUCOPHAGE) 1000 MG tablet Take 1,000 mg by mouth 2 (two) times daily with a meal.    . potassium chloride SA (KLOR-CON M20) 20 MEQ tablet 1 PILL TWICE A DAY 40 tablet 0  . simvastatin (ZOCOR) 40 MG tablet Take 40 mg by mouth daily.    Marland Kitchen thiamine 100 MG tablet Take 1 tablet (100 mg total) by mouth daily. 90 tablet 0  . traMADol (ULTRAM) 50 MG tablet TAKE 1 TABLET (50 MG TOTAL) BY MOUTH EVERY 6 (SIX) HOURS AS NEEDED FOR MODERATE PAIN OR SEVERE PAIN 30 tablet 0  . vitamin B-12 2000 MCG tablet Take 1 tablet (2,000 mcg total) by mouth daily. 90 tablet 1   No current facility-administered medications for this visit.    VITAL SIGNS: There were no vitals taken for this visit. There were no vitals filed for this visit.  Estimated body mass index is 27.94 kg/m as calculated from the following:   Height as of an earlier encounter on 07/11/20: 6' (1.829 m).   Weight as of an earlier encounter on 07/11/20: 93.4 kg (206 lb).  LABS: CBC:    Component Value Date/Time   WBC 10.1 07/11/2020 1013   HGB 12.3 (L) 07/11/2020 1013   HCT 37.1 (L) 07/11/2020 1013   PLT 322 07/11/2020 1013   MCV 87.9 07/11/2020 1013   NEUTROABS 5.3 07/11/2020 1013   LYMPHSABS 3.8 07/11/2020 1013   MONOABS 0.7 07/11/2020 1013   EOSABS 0.3 07/11/2020 1013   BASOSABS 0.1 07/11/2020 1013   Comprehensive Metabolic Panel:    Component Value Date/Time   NA 136 07/11/2020 1013   K 4.4 07/11/2020 1013   CL 102 07/11/2020 1013   CO2 23 07/11/2020 1013   BUN 16 07/11/2020 1013   CREATININE 0.94 07/11/2020 1013   GLUCOSE 210 (H) 07/11/2020  1013   CALCIUM 10.0 07/11/2020 1013   AST 16 07/11/2020 1013   ALT 10 07/11/2020 1013   ALKPHOS 97 07/11/2020 1013   BILITOT 0.5 07/11/2020 1013   PROT 7.1 07/11/2020 1013   ALBUMIN 3.8 07/11/2020 1013     Present during today's visit: Mr. Flett and his daughter [name]  Assessment and Plan: Start plan: Begin taking enzalutamide today   Patient Education I spoke with patient for overview of new oral chemotherapy medication: Xtandi (enzalutamide) for the treatment of castration sensitive metastatic prostate cancer in conjunction with ADT, planned duration until disease progression or unacceptable drug toxicity.   Administration: Counseled patient on administration, dosing, side effects, monitoring, drug-food interactions, safe handling, storage, and disposal. Patient will take 3 tablets (120 mg total) by mouth daily.  Side Effects: Side effects include but not limited to: fatigue, seizure (low risk), hyperglycemia.    Adherence: After discussion with patient  no patient barriers to medication adherence identified. Michelle handles his medication. Provided them with a AM/PM pillbox in clinic today. Reviewed with patient importance of keeping a medication schedule and plan for any missed doses.  Mr. Pooler and [name] voiced understanding and appreciation. All questions answered. Medication handout provided.  Provided patient with [facility] phone number. Patient knows to call the office with questions or concerns. Oral [facility] will continue to follow.  Patient expressed understanding and was in agreement with this plan. He also understands that He can call clinic at any time with any questions, concerns, or complaints.   Medication Access Issues: No issues, [name] has in hand during today's visit. He has been approved for manufacturer assistance. Suggested they call for refills when he has ~10 days left on hand.  Follow-up plan:  f/u with patient on 08/07/20  Thank you for allowing me to participate in the care of this patient.   Time Total: 15 mins  Visit consisted of counseling and education on dealing with issues of symptom management in the setting of serious and potentially life-threatening illness.Greater than 50%  of this time was spent counseling and coordinating care related to the above assessment and plan.  Signed by: Darl Pikes, PharmD, BCPS, Salley Slaughter, CPP Hematology/Oncology Clinical Pharmacist Practitioner [facility] [phone]  07/11/2020 11:35 AM

## 2020-07-13 ENCOUNTER — Other Ambulatory Visit: Payer: Self-pay | Admitting: Internal Medicine

## 2020-08-06 ENCOUNTER — Other Ambulatory Visit: Payer: Self-pay | Admitting: Internal Medicine

## 2020-08-06 DIAGNOSIS — C61 Malignant neoplasm of prostate: Secondary | ICD-10-CM

## 2020-08-07 ENCOUNTER — Inpatient Hospital Stay: Payer: Medicare Other | Admitting: Pharmacist

## 2020-08-07 ENCOUNTER — Inpatient Hospital Stay (HOSPITAL_BASED_OUTPATIENT_CLINIC_OR_DEPARTMENT_OTHER): Payer: Medicare Other | Admitting: Internal Medicine

## 2020-08-07 ENCOUNTER — Encounter: Payer: Self-pay | Admitting: Internal Medicine

## 2020-08-07 ENCOUNTER — Inpatient Hospital Stay: Payer: Medicare Other | Attending: Internal Medicine

## 2020-08-07 DIAGNOSIS — Z7984 Long term (current) use of oral hypoglycemic drugs: Secondary | ICD-10-CM | POA: Diagnosis not present

## 2020-08-07 DIAGNOSIS — I10 Essential (primary) hypertension: Secondary | ICD-10-CM | POA: Diagnosis not present

## 2020-08-07 DIAGNOSIS — Z79899 Other long term (current) drug therapy: Secondary | ICD-10-CM | POA: Insufficient documentation

## 2020-08-07 DIAGNOSIS — Z7982 Long term (current) use of aspirin: Secondary | ICD-10-CM | POA: Insufficient documentation

## 2020-08-07 DIAGNOSIS — C61 Malignant neoplasm of prostate: Secondary | ICD-10-CM

## 2020-08-07 DIAGNOSIS — F17210 Nicotine dependence, cigarettes, uncomplicated: Secondary | ICD-10-CM | POA: Diagnosis not present

## 2020-08-07 DIAGNOSIS — E119 Type 2 diabetes mellitus without complications: Secondary | ICD-10-CM | POA: Insufficient documentation

## 2020-08-07 DIAGNOSIS — C7800 Secondary malignant neoplasm of unspecified lung: Secondary | ICD-10-CM | POA: Diagnosis present

## 2020-08-07 DIAGNOSIS — C7951 Secondary malignant neoplasm of bone: Secondary | ICD-10-CM | POA: Insufficient documentation

## 2020-08-07 LAB — COMPREHENSIVE METABOLIC PANEL
ALT: 9 U/L (ref 0–44)
AST: 16 U/L (ref 15–41)
Albumin: 3.8 g/dL (ref 3.5–5.0)
Alkaline Phosphatase: 85 U/L (ref 38–126)
Anion gap: 12 (ref 5–15)
BUN: 16 mg/dL (ref 8–23)
CO2: 22 mmol/L (ref 22–32)
Calcium: 9.9 mg/dL (ref 8.9–10.3)
Chloride: 104 mmol/L (ref 98–111)
Creatinine, Ser: 0.97 mg/dL (ref 0.61–1.24)
GFR, Estimated: >60 mL/min (ref >60)
Glucose, Bld: 160 mg/dL — ABNORMAL HIGH (ref 70–99)
Potassium: 4.4 mmol/L (ref 3.5–5.1)
Sodium: 138 mmol/L (ref 135–145)
Total Bilirubin: 0.6 mg/dL (ref 0.3–1.2)
Total Protein: 6.9 g/dL (ref 6.5–8.1)

## 2020-08-07 LAB — CBC WITH DIFFERENTIAL/PLATELET
Abs Immature Granulocytes: 0.03 10*3/uL (ref 0.00–0.07)
Basophils Absolute: 0.1 10*3/uL (ref 0.0–0.1)
Basophils Relative: 1 %
Eosinophils Absolute: 0.3 10*3/uL (ref 0.0–0.5)
Eosinophils Relative: 3 %
HCT: 36.8 % — ABNORMAL LOW (ref 39.0–52.0)
Hemoglobin: 12.1 g/dL — ABNORMAL LOW (ref 13.0–17.0)
Immature Granulocytes: 0 %
Lymphocytes Relative: 38 %
Lymphs Abs: 4.2 10*3/uL — ABNORMAL HIGH (ref 0.7–4.0)
MCH: 28.6 pg (ref 26.0–34.0)
MCHC: 32.9 g/dL (ref 30.0–36.0)
MCV: 87 fL (ref 80.0–100.0)
Monocytes Absolute: 0.8 10*3/uL (ref 0.1–1.0)
Monocytes Relative: 7 %
Neutro Abs: 5.6 10*3/uL (ref 1.7–7.7)
Neutrophils Relative %: 51 %
Platelets: 315 10*3/uL (ref 150–400)
RBC: 4.23 MIL/uL (ref 4.22–5.81)
RDW: 14.1 % (ref 11.5–15.5)
WBC: 11.1 10*3/uL — ABNORMAL HIGH (ref 4.0–10.5)
nRBC: 0 % (ref 0.0–0.2)

## 2020-08-07 LAB — PSA: Prostatic Specific Antigen: 0.41 ng/mL (ref 0.00–4.00)

## 2020-08-07 NOTE — Progress Notes (Signed)
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   # NOV 2021- CTA [ER] Diffuse bilateral pulmonary nodules including a cavitary lesion in the right upper lobe measuring approximately 2 cm. There is extensive nodular pleural thickening bilaterally, greatest in the left lower lobe.  November 2021 PET scan-multiple lung lesions multiple bone lesions and prostate uptake.  February 2022-prostate biopsy [Dr.Brandon]   # DM-2; NO COPD [?]; ACTIVE SMOKER 65ppd.  # DEC 2nd week- FIRMAGON; # JAN 12TH, 2022-ZYTIGA  1000 MG+ PRED 5MG; FEB MID 2022- ELIGARD q6M; MARCH 2022- DISCONTINUE ZYTIGA sec to PRESS [severe hypokalemia/mental status changes hospitalization];  #   # MAY 6th, 2022- XTnadi 3pills/day   # NGS/MOLECULAR TESTS: MARCH 2022-foundation 1 no targetable mutation./Genetic evaluation negative    # PALLIATIVE CARE EVALUATION:  # PAIN MANAGEMENT:    DIAGNOSIS: Prostate cancer  STAGE:   IV      ;  GOALS: Palliative  CURRENT/MOST RECENT THERAPY : ADT+ Xtandi    Prostate cancer metastatic to multiple sites [facility])  02/04/2020 Initial Diagnosis   Prostate cancer metastatic to multiple sites [facility])   03/19/2020 Cancer Staging   Staging form: Prostate, AJCC 8th Edition - Clinical: Stage IVB (pM1b) - Signed by Cammie Sickle, MD on 03/19/2020    Genetic Testing   Negative genetic testing. No pathogenic variants identified on the [facility] Multi-Cancer Panel. VUS in AXIN2 called c.1235A>C identified. The report date is 03/23/2020.   The Multi-Cancer Panel offered by [facility] includes sequencing and/or deletion duplication testing of the following 84 genes: AIP, ALK, APC, ATM, AXIN2,BAP1,  BARD1, BLM,  BMPR1A, BRCA1, BRCA2, BRIP1, CASR, CDC73, CDH1, CDK4, CDKN1B, CDKN1C, CDKN2A (p14ARF), CDKN2A (p16INK4a), CEBPA, CHEK2, CTNNA1, DICER1, DIS3L2, EGFR (c.2369C>T, p.Thr790Met variant only), EPCAM (Deletion/duplication testing only), FH, FLCN, GATA2, GPC3, GREM1 (Promoter region deletion/duplication testing only), HOXB13 (c.251G>A, p.Gly84Glu), HRAS, KIT, MAX, MEN1, MET, MITF (c.952G>A, p.Glu318Lys variant only), MLH1, MSH2, MSH3, MSH6, MUTYH, NBN, NF1, NF2, NTHL1, PALB2, PDGFRA, PHOX2B, PMS2, POLD1, POLE, POT1, PRKAR1A, PTCH1, PTEN, RAD50, RAD51C, RAD51D, RB1, RECQL4, RET, RUNX1, SDHAF2, SDHA (sequence changes only), SDHB, SDHC, SDHD, SMAD4, SMARCA4, SMARCB1, SMARCE1, STK11, SUFU, TERC, TERT, TMEM127, TP53, TSC1, TSC2, VHL, WRN and WT1.       HISTORY OF PRESENTING ILLNESS:  Brent Glass 80 y.o.  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    Patient denies any new headaches but denies any mental status changes.  No falls.  No dizziness.  No nausea or abdominal pain.    Review of Systems  Constitutional: Positive for weight loss. Negative for chills, diaphoresis, fever and malaise/fatigue.  HENT: Negative for nosebleeds and sore throat.   Eyes: Negative for double vision.  Respiratory: Positive for cough. Negative for hemoptysis, sputum production, shortness of breath and wheezing.   Cardiovascular: Negative for chest pain, palpitations, orthopnea and leg swelling.  Gastrointestinal: Negative for abdominal pain, blood in stool, constipation, diarrhea, heartburn, melena, nausea and vomiting.  Genitourinary: Negative for dysuria, frequency and urgency.  Musculoskeletal: Positive for back pain. Negative for joint pain.  Skin: Negative.  Negative for itching and rash.  Neurological: Negative for dizziness, tingling, focal weakness, weakness and headaches.  Endo/Heme/Allergies: Does not bruise/bleed  easily.  Psychiatric/Behavioral:  Negative for depression. The patient is not nervous/anxious and does not have insomnia.      MEDICAL HISTORY:  Past Medical History:  Diagnosis Date  . Arthritis   . BPH (benign prostatic hyperplasia)   . BPH with obstruction/lower urinary tract symptoms   . Diabetes ([place])   . Disorder resulting from impaired renal function   . Elevated PSA   . Family history of lung cancer   . Family history of lymphoma   . Heart murmur   . Hypertension   . Knee pain   . Lesion of lung   . Low HDL (under 40)   . Obesity   . Urinary hesitancy     SURGICAL HISTORY: Past Surgical History:  Procedure Laterality Date  . HERNIA REPAIR  6962   Umbilical Hernia Repair  . KNEE ARTHROSCOPY Left 04/10/2015   Procedure: ARTHROSCOPY KNEE, partial medial meniscectomy, partial synovectomy;  Surgeon: Hessie Knows, MD;  Location: [facility] ORS;  Service: Orthopedics;  Laterality: Left;    SOCIAL HISTORY: Social History   Socioeconomic History  . Marital status: Widowed    Spouse name: Not on file  . Number of children: Not on file  . Years of education: Not on file  . Highest education level: Not on file  Occupational History  . Not on file  Tobacco Use  . Smoking status: Current Every Day Smoker    Packs/day: 1.00    Types: Cigarettes  . Smokeless tobacco: Never Used  Vaping Use  . Vaping Use: Never used  Substance and Sexual Activity  . Alcohol use: No    Alcohol/week: 0.0 standard drinks  . Drug use: No  . Sexual activity: Not Currently  Other Topics Concern  . Not on file  Social History Narrative   Lives in [place]; self; daughter lives 1 mile. Smoke 1ppd > 65 years; stopped 25 years ago.  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    Social Determinants of Health   Financial Resource Strain: Not on file  Food Insecurity: Not on file  Transportation Needs: Not on file  Physical Activity: Not on file  Stress: Not on file  Social Connections: Not on file  Intimate  Partner Violence: Not on file    FAMILY HISTORY: Family History  Problem Relation Age of Onset  . Diabetes Mother   . Lung cancer Mother   . Lymphoma Son 28       died in 40.   . Kidney disease Neg Hx   . Prostate cancer Neg Hx     ALLERGIES:  has No Known Allergies.  MEDICATIONS:  Current Outpatient Medications  Medication Sig Dispense Refill  . acetaminophen (TYLENOL) 650 MG CR tablet Take 650 mg by mouth every 8 (eight) hours as needed for pain.    Marland Kitchen albuterol (PROVENTIL HFA;VENTOLIN HFA) 108 (90 BASE) MCG/ACT inhaler Inhale 2 puffs into the lungs every 6 (six) hours as needed for wheezing or shortness of breath.    Marland Kitchen aspirin 81 MG tablet Take 81 mg by mouth daily.    . enzalutamide (XTANDI) 40 MG tablet Take 3 tablets (120 mg total) by mouth daily. 120 tablet 3  . finasteride (PROSCAR) 5 MG tablet TAKE 1 TABLET BY MOUTH EVERY DAY (Patient taking differently: Take 5 mg by mouth daily.) 90 tablet 3  . lisinopril (ZESTRIL) 10 MG tablet Take 1 tablet by mouth daily.    . metFORMIN (GLUCOPHAGE) 1000 MG tablet Take 1,000 mg by  mouth 2 (two) times daily with a meal.    . simvastatin (ZOCOR) 40 MG tablet Take 40 mg by mouth daily.    Marland Kitchen thiamine 100 MG tablet Take 1 tablet (100 mg total) by mouth daily. 90 tablet 0  . traMADol (ULTRAM) 50 MG tablet TAKE 1 TABLET (50 MG TOTAL) BY MOUTH EVERY 6 (SIX) HOURS AS NEEDED FOR MODERATE PAIN OR SEVERE PAIN 30 tablet 0  . vitamin B-12 2000 MCG tablet Take 1 tablet (2,000 mcg total) by mouth daily. 90 tablet 1  . potassium chloride SA (KLOR-CON M20) 20 MEQ tablet 1 PILL TWICE A DAY (Patient not taking: Reported on 08/07/2020) 40 tablet 0   No current facility-administered medications for this visit.      Marland Kitchen  PHYSICAL EXAMINATION: ECOG PERFORMANCE STATUS: 0 - Asymptomatic  Vitals:   08/07/20 1008  BP: 120/63  Pulse: 75  Resp: 18  Temp: (!) 96.8 F (36 C)  SpO2: 98%   Filed Weights   08/07/20 1008  Weight: 92.5 kg    Physical  Exam Constitutional:      Comments: Ambulating independently.  Accompanied by his daughter.  HENT:     Head: Normocephalic and atraumatic.     Mouth/Throat:     Pharynx: No oropharyngeal exudate.  Eyes:     Pupils: Pupils are equal, round, and reactive to light.  Cardiovascular:     Rate and Rhythm: Normal rate and regular rhythm.  Pulmonary:     Effort: No respiratory distress.     Breath sounds: No wheezing.     Comments: Decreased breath sounds bilaterally. Abdominal:     General: Bowel sounds are normal. There is no distension.     Palpations: Abdomen is soft. There is no mass.     Tenderness: There is no abdominal tenderness. There is no guarding or rebound.  Musculoskeletal:        General: No tenderness. Normal range of motion.     Cervical back: Normal range of motion and neck supple.  Skin:    General: Skin is warm.  Neurological:     Mental Status: He is alert and oriented to person, place, and time.  Psychiatric:        Mood and Affect: Affect normal.      LABORATORY DATA:  I have reviewed the data as listed Lab Results  Component Value Date   WBC 11.1 (H) 08/07/2020   HGB 12.1 (L) 08/07/2020   HCT 36.8 (L) 08/07/2020   MCV 87.0 08/07/2020   PLT 315 08/07/2020   Recent Labs    06/13/20 0926 07/11/20 1013 08/07/20 0924  NA 135 136 138  K 4.1 4.4 4.4  CL 101 102 104  CO2 23 23 22   GLUCOSE 279* 210* 160*  BUN 28* 16 16  CREATININE 1.27* 0.94 0.97  CALCIUM 9.9 10.0 9.9  GFRNONAA 57* >60 >60  PROT 7.1 7.1 6.9  ALBUMIN 3.9 3.8 3.8  AST 14* 16 16  ALT 10 10 9   ALKPHOS 95 97 85  BILITOT 0.3 0.5 0.6    RADIOGRAPHIC STUDIES: I have personally reviewed the radiological images as listed and agreed with the findings in the report. No results found.  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  PROSTATE ADENOCARCINOMA- castrate  sensitive; November 2021 PET scan-multiple lung lesions multiple bone lesions and prostate uptake.  On Eligard;   PSA significantly improved.  Stable.  #Currently on X-tandi 120 mg/day; tolerating well.  Continue current medication.  # vision changes-clinically not prostate cancer.  Await ophthalmology evaluation next week  #Chest tenderness-grade 1-2.  Stable sec to ADT- continue tramadol prn; add vaseline BID.  Stable.  * Eligard-Aug 2022 # DISPOSITION: # Follow up in 4 weeks MD- cbc/cmp/PSA- Dr.B    All questions were answered. The patient knows to call the clinic with any problems, questions or concerns.    Cammie Sickle, MD 08/07/2020 2:36 PM

## 2020-08-07 NOTE — Progress Notes (Signed)
[place]  Telephone:([phone] [phone]  Patient Care Team: Donnie Coffin, [name], RN as Oncology Nurse Navigator   Name of the patient: Brent Glass  [id number]  1940-12-13   Date of visit: 08/07/20  HPI: Patient is a 80 y.o. male with castration sensitive metastatic prostate. Planned treatment of Xtandi (enzalutamide) and ADT.Patient is transitioning from abiraterone due to severe hypokalemia.  Reason for Consult: Oral chemotherapy follow-up for Xtandi (enzalutamide) therapy.   PAST MEDICAL HISTORY: Past Medical History:  Diagnosis Date  . Arthritis   . BPH (benign prostatic hyperplasia)   . BPH with obstruction/lower urinary tract symptoms   . Diabetes ([place])   . Disorder resulting from impaired renal function   . Elevated PSA   . Family history of lung cancer   . Family history of lymphoma   . Heart murmur   . Hypertension   . Knee pain   . Lesion of lung   . Low HDL (under 40)   . Obesity   . 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   # NOV 2021- CTA [ER] Diffuse bilateral pulmonary nodules including a cavitary lesion in the right upper lobe measuring approximately 2 cm. There is extensive nodular pleural thickening bilaterally, greatest in the left lower lobe.  November 2021 PET scan-multiple lung lesions multiple bone lesions and prostate uptake.    # DM-2; NO COPD [?]; ACTIVE SMOKER 65ppd.  # DEC 2nd week- FIRMAGON; # JAN 12TH, 2022-ZYTIGA  1000 MG+ PRED 5MG; FEB MID 2022- ELIGARD q6M; MARCH 2022- DISCONTINUE ZYTIGA sec to PRESS [severe hypokalemia/mental status changes hospitalization];  # MAY 6th, 2022- XTnadi 3pills/day   # NGS/MOLECULAR TESTS: MARCH 2022-foundation 1 no targetable mutation./Genetic evaluation negative    #  PALLIATIVE CARE EVALUATION:  # PAIN MANAGEMENT:    DIAGNOSIS: Prostate cancer  STAGE:   IV      ;  GOALS: Palliative  CURRENT/MOST RECENT THERAPY : ADT+ Xtandi    Prostate cancer metastatic to multiple sites [facility])  02/04/2020 Initial Diagnosis   Prostate cancer metastatic to multiple sites [facility])   03/19/2020 Cancer Staging   Staging form: Prostate, AJCC 8th Edition - Clinical: Stage IVB (pM1b) - Signed by Cammie Sickle, MD on 03/19/2020    Genetic Testing   Negative genetic testing. No pathogenic variants identified on the [facility] Multi-Cancer Panel. VUS in AXIN2 called c.1235A>C identified. The report date is 03/23/2020.   The Multi-Cancer Panel offered by [facility] includes sequencing and/or deletion duplication testing of the following 84 genes: AIP, ALK, APC, ATM, AXIN2,BAP1,  BARD1, BLM, BMPR1A, BRCA1, BRCA2, BRIP1, CASR, CDC73, CDH1, CDK4, CDKN1B, CDKN1C, CDKN2A (p14ARF), CDKN2A (p16INK4a), CEBPA, CHEK2, CTNNA1, DICER1, DIS3L2, EGFR (c.2369C>T, p.Thr790Met variant only), EPCAM (Deletion/duplication testing only), FH, FLCN, GATA2, GPC3, GREM1 (Promoter region deletion/duplication testing only), HOXB13 (c.251G>A, p.Gly84Glu), HRAS, KIT, MAX, MEN1, MET, MITF (c.952G>A, p.Glu318Lys variant only), MLH1, MSH2, MSH3, MSH6, MUTYH, NBN, NF1, NF2, NTHL1, PALB2, PDGFRA, PHOX2B, PMS2, POLD1, POLE, POT1, PRKAR1A, PTCH1, PTEN, RAD50, RAD51C, RAD51D, RB1, RECQL4, RET, RUNX1, SDHAF2, SDHA (sequence changes only), SDHB, SDHC, SDHD, SMAD4, SMARCA4, SMARCB1, SMARCE1, STK11, SUFU, TERC, TERT, TMEM127, TP53, TSC1, TSC2, VHL, WRN and WT1.      ALLERGIES:  has No Known Allergies.  MEDICATIONS:  Current Outpatient Medications  Medication Sig Dispense Refill  .  acetaminophen (TYLENOL) 650 MG CR tablet Take 650 mg by mouth every 8 (eight) hours as needed for pain.    Marland Kitchen albuterol (PROVENTIL HFA;VENTOLIN HFA) 108 (90 BASE) MCG/ACT inhaler Inhale 2 puffs into the lungs every 6 (six) hours as needed for  wheezing or shortness of breath.    Marland Kitchen aspirin 81 MG tablet Take 81 mg by mouth daily.    . enzalutamide (XTANDI) 40 MG tablet Take 3 tablets (120 mg total) by mouth daily. 120 tablet 3  . finasteride (PROSCAR) 5 MG tablet TAKE 1 TABLET BY MOUTH EVERY DAY (Patient taking differently: Take 5 mg by mouth daily.) 90 tablet 3  . lisinopril (ZESTRIL) 10 MG tablet Take 1 tablet by mouth daily.    . metFORMIN (GLUCOPHAGE) 1000 MG tablet Take 1,000 mg by mouth 2 (two) times daily with a meal.    . potassium chloride SA (KLOR-CON M20) 20 MEQ tablet 1 PILL TWICE A DAY (Patient not taking: Reported on 08/07/2020) 40 tablet 0  . simvastatin (ZOCOR) 40 MG tablet Take 40 mg by mouth daily.    Marland Kitchen thiamine 100 MG tablet Take 1 tablet (100 mg total) by mouth daily. 90 tablet 0  . traMADol (ULTRAM) 50 MG tablet TAKE 1 TABLET (50 MG TOTAL) BY MOUTH EVERY 6 (SIX) HOURS AS NEEDED FOR MODERATE PAIN OR SEVERE PAIN 30 tablet 0  . vitamin B-12 2000 MCG tablet Take 1 tablet (2,000 mcg total) by mouth daily. 90 tablet 1   No current facility-administered medications for this visit.    VITAL SIGNS: There were no vitals taken for this visit. There were no vitals filed for this visit.  Estimated body mass index is 27.67 kg/m as calculated from the following:   Height as of 07/11/20: 6' (1.829 m).   Weight as of an earlier encounter on 08/07/20: 92.5 kg (204 lb).  LABS: CBC:    Component Value Date/Time   WBC 11.1 (H) 08/07/2020 0924   HGB 12.1 (L) 08/07/2020 0924   HCT 36.8 (L) 08/07/2020 0924   PLT 315 08/07/2020 0924   MCV 87.0 08/07/2020 0924   NEUTROABS 5.6 08/07/2020 0924   LYMPHSABS 4.2 (H) 08/07/2020 0924   MONOABS 0.8 08/07/2020 0924   EOSABS 0.3 08/07/2020 0924   BASOSABS 0.1 08/07/2020 0924   Comprehensive Metabolic Panel:    Component Value Date/Time   NA 138 08/07/2020 0924   K 4.4 08/07/2020 0924   CL 104 08/07/2020 0924   CO2 22 08/07/2020 0924   BUN 16 08/07/2020 0924   CREATININE 0.97  08/07/2020 0924   GLUCOSE 160 (H) 08/07/2020 0924   CALCIUM 9.9 08/07/2020 0924   AST 16 08/07/2020 0924   ALT 9 08/07/2020 0924   ALKPHOS 85 08/07/2020 0924   BILITOT 0.6 08/07/2020 0924   PROT 6.9 08/07/2020 0924   ALBUMIN 3.8 08/07/2020 0924     Present during today's visit: patient and his daughter [name]  Assessment and Plan: Continue Xtandi 110m daily   Oral Chemotherapy Side Effect/Intolerance:  No reported fatigue, HTN (at home), edema, constipation or diarrhea.  Oral Chemotherapy Adherence: no reported missed doses. They are using the pill tray to help with his medication schedule  No patient barriers to medication adherence identified.   New medications: none, reported  Medication Access Issues: Patient fills with manufacturer assistance he is about out of medication and has not heard from SSalinasabout a refill. I provided them with the phone number to call for a refill. MSharyn Lull  will call today. Encouraged them to call for refills when he has about 7-10 days left.   Patient expressed understanding and was in agreement with this plan. He also understands that He can call clinic at any time with any questions, concerns, or complaints.   Thank you for allowing me to participate in the care of this very pleasant patient.   Time Total: 15 mins  Visit consisted of counseling and education on dealing with issues of symptom management in the setting of serious and potentially life-threatening illness.Greater than 50%  of this time was spent counseling and coordinating care related to the above assessment and plan.  Signed by: Darl Pikes, PharmD, BCPS, Salley Slaughter, CPP Hematology/Oncology Clinical Pharmacist Practitioner [facility]/HP/AP [facility] [phone]  08/07/2020 12:25 PM

## 2020-08-07 NOTE — Progress Notes (Signed)
Pt in for follow up today.  Reports has not taken potassium for a month, states pharmacy states it has not been approved for refill.

## 2020-08-07 NOTE — Assessment & Plan Note (Addendum)
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  PROSTATE ADENOCARCINOMA- castrate sensitive; November 2021 PET scan-multiple lung lesions multiple bone lesions and prostate uptake.  On Eligard;   PSA significantly improved.  Stable.  #Currently on X-tandi 120 mg/day; tolerating well.  Continue current medication.  Discussed with pharmacy.  # vision changes-clinically not prostate cancer.  Await ophthalmology evaluation next week  #Chest tenderness-grade 1-2.  Stable sec to ADT- continue tramadol prn; add vaseline BID.  Stable.  * Eligard-Aug 2022 # DISPOSITION: # Follow up in 4 weeks MD- cbc/cmp/PSA- Dr.B

## 2020-09-04 ENCOUNTER — Inpatient Hospital Stay: Payer: Medicare Other

## 2020-09-04 ENCOUNTER — Inpatient Hospital Stay (HOSPITAL_BASED_OUTPATIENT_CLINIC_OR_DEPARTMENT_OTHER): Payer: Medicare Other | Admitting: Internal Medicine

## 2020-09-04 DIAGNOSIS — C61 Malignant neoplasm of prostate: Secondary | ICD-10-CM

## 2020-09-04 LAB — CBC WITH DIFFERENTIAL/PLATELET
Abs Immature Granulocytes: 0.02 10*3/uL (ref 0.00–0.07)
Basophils Absolute: 0.1 10*3/uL (ref 0.0–0.1)
Basophils Relative: 1 %
Eosinophils Absolute: 0.3 10*3/uL (ref 0.0–0.5)
Eosinophils Relative: 3 %
HCT: 38.1 % — ABNORMAL LOW (ref 39.0–52.0)
Hemoglobin: 12.1 g/dL — ABNORMAL LOW (ref 13.0–17.0)
Immature Granulocytes: 0 %
Lymphocytes Relative: 42 %
Lymphs Abs: 3.8 10*3/uL (ref 0.7–4.0)
MCH: 28.4 pg (ref 26.0–34.0)
MCHC: 31.8 g/dL (ref 30.0–36.0)
MCV: 89.4 fL (ref 80.0–100.0)
Monocytes Absolute: 0.6 10*3/uL (ref 0.1–1.0)
Monocytes Relative: 7 %
Neutro Abs: 4.3 10*3/uL (ref 1.7–7.7)
Neutrophils Relative %: 47 %
Platelets: 304 10*3/uL (ref 150–400)
RBC: 4.26 MIL/uL (ref 4.22–5.81)
RDW: 14.6 % (ref 11.5–15.5)
WBC: 9 10*3/uL (ref 4.0–10.5)
nRBC: 0 % (ref 0.0–0.2)

## 2020-09-04 LAB — COMPREHENSIVE METABOLIC PANEL
ALT: 8 U/L (ref 0–44)
AST: 14 U/L — ABNORMAL LOW (ref 15–41)
Albumin: 3.7 g/dL (ref 3.5–5.0)
Alkaline Phosphatase: 87 U/L (ref 38–126)
Anion gap: 9 (ref 5–15)
BUN: 16 mg/dL (ref 8–23)
CO2: 23 mmol/L (ref 22–32)
Calcium: 9.8 mg/dL (ref 8.9–10.3)
Chloride: 103 mmol/L (ref 98–111)
Creatinine, Ser: 1.27 mg/dL — ABNORMAL HIGH (ref 0.61–1.24)
GFR, Estimated: 57 mL/min — ABNORMAL LOW (ref >60)
Glucose, Bld: 196 mg/dL — ABNORMAL HIGH (ref 70–99)
Potassium: 4 mmol/L (ref 3.5–5.1)
Sodium: 135 mmol/L (ref 135–145)
Total Bilirubin: 0.7 mg/dL (ref 0.3–1.2)
Total Protein: 6.9 g/dL (ref 6.5–8.1)

## 2020-09-04 LAB — PSA: Prostatic Specific Antigen: 0.36 ng/mL (ref 0.00–4.00)

## 2020-09-04 MED ORDER — CYANOCOBALAMIN 2000 MCG PO TABS: 2000.0000 ug | ORAL_TABLET | Freq: Every day | ORAL | 1 refills | Status: DC

## 2020-09-04 NOTE — Patient Instructions (Signed)
#  Take vitamin D 1000 units once a day [over-the-counter].

## 2020-09-04 NOTE — Assessment & Plan Note (Addendum)
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  PROSTATE ADENOCARCINOMA- castrate sensitive; November 2021 PET scan-multiple lung lesions multiple bone lesions and prostate uptake.  On Eligard;   PSA significantly improved.stable.  #Currently on X-tandi 120 mg/day; tolerating well.  Continue current medication.  Discussed with pharmacy.  #Chest tenderness-grade 1-2. Sec to ADT- continue tramadol prn; add vaseline BID.  Stable  #Myalgias-question secondary to vitamin D deficiency recommend vitamin D 1000 units a day.  * Eligard-Aug 2022  # DISPOSITION: # Follow up in [place] 2nd week of AUG MD- cbc/cmp/PSA- Vit D 25-OH levels.; B12; ELIGARD- Dr.B

## 2020-09-04 NOTE — Progress Notes (Signed)
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   # NOV 2021- CTA [ER] Diffuse bilateral pulmonary nodules including a cavitary lesion in the right upper lobe measuring approximately 2 cm. There is extensive nodular pleural thickening bilaterally, greatest in the left lower lobe.  November 2021 PET scan-multiple lung lesions multiple bone lesions and prostate uptake.  February 2022-prostate biopsy [Dr.Brandon]   # DM-2; NO COPD [?]; ACTIVE SMOKER 65ppd.  # DEC 2nd week- FIRMAGON; # JAN 12TH, 2022-ZYTIGA  1000 MG+ PRED 5MG; FEB MID 2022- ELIGARD q6M; MARCH 2022- DISCONTINUE ZYTIGA sec to PRESS [severe hypokalemia/mental status changes hospitalization];  #   # MAY 6th, 2022- XTnadi 3pills/day   # NGS/MOLECULAR TESTS: MARCH 2022-foundation 1 no targetable mutation./Genetic evaluation negative    # PALLIATIVE CARE EVALUATION:  # PAIN MANAGEMENT:    DIAGNOSIS: Prostate cancer  STAGE:   IV      ;  GOALS: Palliative  CURRENT/MOST RECENT THERAPY : ADT+ Xtandi    Prostate cancer metastatic to multiple sites [facility])  02/04/2020 Initial Diagnosis   Prostate cancer metastatic to multiple sites [facility])   03/19/2020 Cancer Staging   Staging form: Prostate, AJCC 8th Edition - Clinical: Stage IVB (pM1b) - Signed by Cammie Sickle, MD on 03/19/2020     Genetic Testing   Negative genetic testing. No pathogenic variants identified on the [facility] Multi-Cancer Panel. VUS in AXIN2 called c.1235A>C identified. The report date is 03/23/2020.   The Multi-Cancer Panel offered by [facility] includes sequencing and/or deletion duplication testing of the following 84 genes: AIP, ALK, APC, ATM, AXIN2,BAP1,  BARD1, BLM,  BMPR1A, BRCA1, BRCA2, BRIP1, CASR, CDC73, CDH1, CDK4, CDKN1B, CDKN1C, CDKN2A (p14ARF), CDKN2A (p16INK4a), CEBPA, CHEK2, CTNNA1, DICER1, DIS3L2, EGFR (c.2369C>T, p.Thr790Met variant only), EPCAM (Deletion/duplication testing only), FH, FLCN, GATA2, GPC3, GREM1 (Promoter region deletion/duplication testing only), HOXB13 (c.251G>A, p.Gly84Glu), HRAS, KIT, MAX, MEN1, MET, MITF (c.952G>A, p.Glu318Lys variant only), MLH1, MSH2, MSH3, MSH6, MUTYH, NBN, NF1, NF2, NTHL1, PALB2, PDGFRA, PHOX2B, PMS2, POLD1, POLE, POT1, PRKAR1A, PTCH1, PTEN, RAD50, RAD51C, RAD51D, RB1, RECQL4, RET, RUNX1, SDHAF2, SDHA (sequence changes only), SDHB, SDHC, SDHD, SMAD4, SMARCA4, SMARCB1, SMARCE1, STK11, SUFU, TERC, TERT, TMEM127, TP53, TSC1, TSC2, VHL, WRN and WT1.       HISTORY OF PRESENTING ILLNESS:  Brent Glass 80 y.o.  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    Patient denies any swelling in the legs.  Denies any nausea vomiting denies any abdominal pain.  Chronic mild fatigue.  Not any worse.  No falls.  Complains of mild myalgia.  Review of Systems  Constitutional:  Positive for malaise/fatigue. Negative for chills, diaphoresis and fever.  HENT:  Negative for nosebleeds and sore throat.   Eyes:  Negative for double vision.  Respiratory:  Negative for hemoptysis, sputum production, shortness of breath and wheezing.   Cardiovascular:  Negative for chest pain, palpitations, orthopnea and leg swelling.  Gastrointestinal:  Negative for abdominal pain, blood in stool, constipation, diarrhea, heartburn, melena, nausea and vomiting.  Genitourinary:  Negative for dysuria, frequency and urgency.  Musculoskeletal:  Positive for back pain and myalgias. Negative for joint pain.  Skin: Negative.  Negative for itching and rash.  Neurological:  Negative for dizziness, tingling, focal weakness, weakness and headaches.  Endo/Heme/Allergies:  Does not bruise/bleed  easily.  Psychiatric/Behavioral:  Negative for depression. The patient is not nervous/anxious and does not have insomnia.     MEDICAL HISTORY:  Past Medical History:  Diagnosis Date  . Arthritis   . BPH (benign prostatic hyperplasia)   . BPH with obstruction/lower urinary tract symptoms   . Diabetes ([place])   . Disorder resulting from impaired renal function   . Elevated PSA   . Family history of lung cancer   . Family history of lymphoma   . Heart murmur   . Hypertension   . Knee pain   . Lesion of lung   . Low HDL (under 40)   . Obesity   . Urinary hesitancy     SURGICAL HISTORY: Past Surgical History:  Procedure Laterality Date  . HERNIA REPAIR  4098   Umbilical Hernia Repair  . KNEE ARTHROSCOPY Left 04/10/2015   Procedure: ARTHROSCOPY KNEE, partial medial meniscectomy, partial synovectomy;  Surgeon: Hessie Knows, MD;  Location: [facility] ORS;  Service: Orthopedics;  Laterality: Left;    SOCIAL HISTORY: Social History   Socioeconomic History  . Marital status: Widowed    Spouse name: Not on file  . Number of children: Not on file  . Years of education: Not on file  . Highest education level: Not on file  Occupational History  . Not on file  Tobacco Use  . Smoking status: Every Day    Packs/day: 1.00    Pack years: 0.00    Types: Cigarettes  . Smokeless tobacco: Never  Vaping Use  . Vaping Use: Never used  Substance and Sexual Activity  . Alcohol use: No    Alcohol/week: 0.0 standard drinks  . Drug use: No  . Sexual activity: Not Currently  Other Topics Concern  . Not on file  Social History Narrative   Lives in [place]; self; daughter lives 1 mile. Smoke 1ppd > 65 years; stopped 25 years ago.  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    Social Determinants of Health   Financial Resource Strain: Not on file  Food Insecurity: Not on file  Transportation Needs: Not on file  Physical Activity: Not on file  Stress: Not on file  Social  Connections: Not on file  Intimate Partner Violence: Not on file    FAMILY HISTORY: Family History  Problem Relation Age of Onset  . Diabetes Mother   . Lung cancer Mother   . Lymphoma Son 28       died in 40.   . Kidney disease Neg Hx   . Prostate cancer Neg Hx     ALLERGIES:  has No Known Allergies.  MEDICATIONS:  Current Outpatient Medications  Medication Sig Dispense Refill  . acetaminophen (TYLENOL) 650 MG CR tablet Take 650 mg by mouth every 8 (eight) hours as needed for pain.    Marland Kitchen albuterol (PROVENTIL HFA;VENTOLIN HFA) 108 (90 BASE) MCG/ACT inhaler Inhale 2 puffs into the lungs every 6 (six) hours as needed for wheezing or shortness of breath.    Marland Kitchen aspirin 81 MG tablet Take 81 mg by mouth daily.    . enzalutamide (XTANDI) 40 MG tablet Take 3 tablets (120 mg total) by mouth daily. 120 tablet 3  . finasteride (PROSCAR) 5 MG tablet TAKE 1 TABLET BY MOUTH EVERY DAY (Patient taking differently: Take 5 mg by mouth daily.) 90 tablet 3  . lisinopril (ZESTRIL) 10 MG tablet Take  1 tablet by mouth daily.    . metFORMIN (GLUCOPHAGE) 1000 MG tablet Take 1,000 mg by mouth 2 (two) times daily with a meal.    . simvastatin (ZOCOR) 40 MG tablet Take 40 mg by mouth daily.    Marland Kitchen thiamine 100 MG tablet Take 1 tablet (100 mg total) by mouth daily. 90 tablet 0  . traMADol (ULTRAM) 50 MG tablet TAKE 1 TABLET (50 MG TOTAL) BY MOUTH EVERY 6 (SIX) HOURS AS NEEDED FOR MODERATE PAIN OR SEVERE PAIN 30 tablet 0  . cyanocobalamin 2000 MCG tablet Take 1 tablet (2,000 mcg total) by mouth daily. 90 tablet 1  . potassium chloride SA (KLOR-CON M20) 20 MEQ tablet 1 PILL TWICE A DAY (Patient not taking: No sig reported) 40 tablet 0   No current facility-administered medications for this visit.      Marland Kitchen  PHYSICAL EXAMINATION: ECOG PERFORMANCE STATUS: 0 - Asymptomatic  Vitals:   09/04/20 1044  BP: 100/65  Pulse: 76  Resp: 16  Temp: 97.8 F (36.6 C)  SpO2: 98%   Filed Weights   09/04/20 1044   Weight: 204 lb (92.5 kg)    Physical Exam Constitutional:      Comments: Ambulating independently.  Accompanied by his daughter.  HENT:     Head: Normocephalic and atraumatic.     Mouth/Throat:     Pharynx: No oropharyngeal exudate.  Eyes:     Pupils: Pupils are equal, round, and reactive to light.  Cardiovascular:     Rate and Rhythm: Normal rate and regular rhythm.  Pulmonary:     Effort: No respiratory distress.     Breath sounds: No wheezing.     Comments: Decreased breath sounds bilaterally. Abdominal:     General: Bowel sounds are normal. There is no distension.     Palpations: Abdomen is soft. There is no mass.     Tenderness: There is no abdominal tenderness. There is no guarding or rebound.  Musculoskeletal:        General: No tenderness. Normal range of motion.     Cervical back: Normal range of motion and neck supple.  Skin:    General: Skin is warm.  Neurological:     Mental Status: He is alert and oriented to person, place, and time.  Psychiatric:        Mood and Affect: Affect normal.     LABORATORY DATA:  I have reviewed the data as listed Lab Results  Component Value Date   WBC 9.0 09/04/2020   HGB 12.1 (L) 09/04/2020   HCT 38.1 (L) 09/04/2020   MCV 89.4 09/04/2020   PLT 304 09/04/2020   Recent Labs    07/11/20 1013 08/07/20 0924 09/04/20 1013  NA 136 138 135  K 4.4 4.4 4.0  CL 102 104 103  CO2 23 22 23   GLUCOSE 210* 160* 196*  BUN 16 16 16   CREATININE 0.94 0.97 1.27*  CALCIUM 10.0 9.9 9.8  GFRNONAA >60 >60 57*  PROT 7.1 6.9 6.9  ALBUMIN 3.8 3.8 3.7  AST 16 16 14*  ALT 10 9 8   ALKPHOS 97 85 87  BILITOT 0.5 0.6 0.7    RADIOGRAPHIC STUDIES: I have personally reviewed the radiological images as listed and agreed with the findings in the report. No results found.  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  PROSTATE  ADENOCARCINOMA- castrate sensitive; November 2021 PET scan-multiple lung  lesions multiple bone lesions and prostate uptake.  On Eligard;   PSA significantly improved.STABLE.   #Currently on X-tandi 120 mg/day; tolerating well.  Continue current medication.  Discussed with pharmacy.  #Chest tenderness-grade 1-2.  Stable sec to ADT- continue tramadol prn; add vaseline BID.  Stable.  # Mylagia- vit 1000 unit  * Eligard-Aug 2022  # DISPOSITION: # Follow up in [facility] 2nd week of AUG MD- cbc/cmp/PSA- Vit D 25-OH levels.; B12; ELIGARD- Dr.B  All questions were answered. The patient knows to call the clinic with any problems, questions or concerns.    Cammie Sickle, MD 09/08/2020 4:45 PM

## 2020-09-08 ENCOUNTER — Encounter: Payer: Self-pay | Admitting: Internal Medicine

## 2020-09-15 ENCOUNTER — Other Ambulatory Visit: Payer: Self-pay | Admitting: Internal Medicine

## 2020-09-16 ENCOUNTER — Other Ambulatory Visit: Payer: Self-pay

## 2020-09-16 ENCOUNTER — Encounter: Payer: Self-pay | Admitting: Internal Medicine

## 2020-09-16 ENCOUNTER — Ambulatory Visit
Admission: RE | Admit: 2020-09-16 | Discharge: 2020-09-16 | Disposition: A | Discharge status: Home / Self Care | Payer: Medicare Other | Attending: Internal Medicine | Admitting: Internal Medicine

## 2020-09-16 ENCOUNTER — Inpatient Hospital Stay: Payer: Medicare Other | Attending: Internal Medicine

## 2020-09-16 ENCOUNTER — Telehealth: Payer: Self-pay | Admitting: Internal Medicine

## 2020-09-16 ENCOUNTER — Inpatient Hospital Stay: Payer: Medicare Other

## 2020-09-16 ENCOUNTER — Other Ambulatory Visit: Payer: Self-pay | Admitting: *Deleted

## 2020-09-16 ENCOUNTER — Ambulatory Visit
Admission: RE | Admit: 2020-09-16 | Discharge: 2020-09-16 | Disposition: A | Discharge status: Home / Self Care | Payer: Medicare Other | Source: Ambulatory Visit | Attending: Internal Medicine | Admitting: Internal Medicine

## 2020-09-16 ENCOUNTER — Other Ambulatory Visit: Payer: Self-pay | Admitting: Internal Medicine

## 2020-09-16 ENCOUNTER — Inpatient Hospital Stay (HOSPITAL_BASED_OUTPATIENT_CLINIC_OR_DEPARTMENT_OTHER): Payer: Medicare Other | Admitting: Internal Medicine

## 2020-09-16 VITALS — BP 123/65 | HR 86 | Temp 98.2°F | Resp 18 | Wt 207.1 lb

## 2020-09-16 DIAGNOSIS — C61 Malignant neoplasm of prostate: Secondary | ICD-10-CM

## 2020-09-16 DIAGNOSIS — E119 Type 2 diabetes mellitus without complications: Secondary | ICD-10-CM | POA: Insufficient documentation

## 2020-09-16 DIAGNOSIS — Z79899 Other long term (current) drug therapy: Secondary | ICD-10-CM | POA: Insufficient documentation

## 2020-09-16 DIAGNOSIS — C7951 Secondary malignant neoplasm of bone: Secondary | ICD-10-CM | POA: Insufficient documentation

## 2020-09-16 DIAGNOSIS — M25551 Pain in right hip: Secondary | ICD-10-CM | POA: Diagnosis present

## 2020-09-16 DIAGNOSIS — Z7984 Long term (current) use of oral hypoglycemic drugs: Secondary | ICD-10-CM | POA: Diagnosis not present

## 2020-09-16 DIAGNOSIS — I10 Essential (primary) hypertension: Secondary | ICD-10-CM | POA: Insufficient documentation

## 2020-09-16 DIAGNOSIS — C7800 Secondary malignant neoplasm of unspecified lung: Secondary | ICD-10-CM | POA: Diagnosis not present

## 2020-09-16 DIAGNOSIS — Z833 Family history of diabetes mellitus: Secondary | ICD-10-CM | POA: Insufficient documentation

## 2020-09-16 DIAGNOSIS — F17210 Nicotine dependence, cigarettes, uncomplicated: Secondary | ICD-10-CM | POA: Insufficient documentation

## 2020-09-16 DIAGNOSIS — Z7982 Long term (current) use of aspirin: Secondary | ICD-10-CM | POA: Diagnosis not present

## 2020-09-16 DIAGNOSIS — Z801 Family history of malignant neoplasm of trachea, bronchus and lung: Secondary | ICD-10-CM | POA: Diagnosis not present

## 2020-09-16 DIAGNOSIS — Z807 Family history of other malignant neoplasms of lymphoid, hematopoietic and related tissues: Secondary | ICD-10-CM | POA: Diagnosis not present

## 2020-09-16 LAB — CBC WITH DIFFERENTIAL/PLATELET
Abs Immature Granulocytes: 0.03 10*3/uL (ref 0.00–0.07)
Basophils Absolute: 0.1 10*3/uL (ref 0.0–0.1)
Basophils Relative: 1 %
Eosinophils Absolute: 0.2 10*3/uL (ref 0.0–0.5)
Eosinophils Relative: 3 %
HCT: 35.3 % — ABNORMAL LOW (ref 39.0–52.0)
Hemoglobin: 11.8 g/dL — ABNORMAL LOW (ref 13.0–17.0)
Immature Granulocytes: 0 %
Lymphocytes Relative: 39 %
Lymphs Abs: 3.3 10*3/uL (ref 0.7–4.0)
MCH: 28.8 pg (ref 26.0–34.0)
MCHC: 33.4 g/dL (ref 30.0–36.0)
MCV: 86.1 fL (ref 80.0–100.0)
Monocytes Absolute: 0.6 10*3/uL (ref 0.1–1.0)
Monocytes Relative: 7 %
Neutro Abs: 4.4 10*3/uL (ref 1.7–7.7)
Neutrophils Relative %: 50 %
Platelets: 325 10*3/uL (ref 150–400)
RBC: 4.1 MIL/uL — ABNORMAL LOW (ref 4.22–5.81)
RDW: 14.8 % (ref 11.5–15.5)
WBC: 8.6 10*3/uL (ref 4.0–10.5)
nRBC: 0 % (ref 0.0–0.2)

## 2020-09-16 LAB — VITAMIN B12: Vitamin B-12: 467 pg/mL (ref 180–914)

## 2020-09-16 LAB — COMPREHENSIVE METABOLIC PANEL
ALT: 9 U/L (ref 0–44)
AST: 12 U/L — ABNORMAL LOW (ref 15–41)
Albumin: 3.6 g/dL (ref 3.5–5.0)
Alkaline Phosphatase: 84 U/L (ref 38–126)
Anion gap: 6 (ref 5–15)
BUN: 15 mg/dL (ref 8–23)
CO2: 25 mmol/L (ref 22–32)
Calcium: 9.5 mg/dL (ref 8.9–10.3)
Chloride: 102 mmol/L (ref 98–111)
Creatinine, Ser: 0.98 mg/dL (ref 0.61–1.24)
GFR, Estimated: >60 mL/min (ref >60)
Glucose, Bld: 278 mg/dL — ABNORMAL HIGH (ref 70–99)
Potassium: 3.8 mmol/L (ref 3.5–5.1)
Sodium: 133 mmol/L — ABNORMAL LOW (ref 135–145)
Total Bilirubin: 0.8 mg/dL (ref 0.3–1.2)
Total Protein: 6.9 g/dL (ref 6.5–8.1)

## 2020-09-16 LAB — PSA: Prostatic Specific Antigen: 0.28 ng/mL (ref 0.00–4.00)

## 2020-09-16 NOTE — Patient Instructions (Signed)
#   hip x-rays today.

## 2020-09-16 NOTE — Progress Notes (Signed)
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   # NOV 2021- CTA [ER] Diffuse bilateral pulmonary nodules including a cavitary lesion in the right upper lobe measuring approximately 2 cm. There is extensive nodular pleural thickening bilaterally, greatest in the left lower lobe.  November 2021 PET scan-multiple lung lesions multiple bone lesions and prostate uptake.  February 2022-prostate biopsy [Dr.Brandon]   # DM-2; NO COPD [?]; ACTIVE SMOKER 65ppd.  # DEC 2nd week- FIRMAGON; # JAN 12TH, 2022-ZYTIGA  1000 MG+ PRED 5MG; FEB MID 2022- ELIGARD q6M; MARCH 2022- DISCONTINUE ZYTIGA sec to PRESS [severe hypokalemia/mental status changes hospitalization];  #   # MAY 6th, 2022- XTnadi 3pills/day   # NGS/MOLECULAR TESTS: MARCH 2022-foundation 1 no targetable mutation./Genetic evaluation negative    # PALLIATIVE CARE EVALUATION:  # PAIN MANAGEMENT:    DIAGNOSIS: Prostate cancer  STAGE:   IV      ;  GOALS: Palliative  CURRENT/MOST RECENT THERAPY : ADT+ Xtandi    Prostate cancer metastatic to multiple sites [facility])  02/04/2020 Initial Diagnosis   Prostate cancer metastatic to multiple sites [facility])   03/19/2020 Cancer Staging   Staging form: Prostate, AJCC 8th Edition - Clinical: Stage IVB (pM1b) - Signed by Cammie Sickle, MD on 03/19/2020     Genetic Testing   Negative genetic testing. No pathogenic variants identified on the [facility] Multi-Cancer Panel. VUS in AXIN2 called c.1235A>C identified. The report date is 03/23/2020.   The Multi-Cancer Panel offered by [facility] includes sequencing and/or deletion duplication testing of the following 84 genes: AIP, ALK, APC, ATM, AXIN2,BAP1,  BARD1, BLM,  BMPR1A, BRCA1, BRCA2, BRIP1, CASR, CDC73, CDH1, CDK4, CDKN1B, CDKN1C, CDKN2A (p14ARF), CDKN2A (p16INK4a), CEBPA, CHEK2, CTNNA1, DICER1, DIS3L2, EGFR (c.2369C>T, p.Thr790Met variant only), EPCAM (Deletion/duplication testing only), FH, FLCN, GATA2, GPC3, GREM1 (Promoter region deletion/duplication testing only), HOXB13 (c.251G>A, p.Gly84Glu), HRAS, KIT, MAX, MEN1, MET, MITF (c.952G>A, p.Glu318Lys variant only), MLH1, MSH2, MSH3, MSH6, MUTYH, NBN, NF1, NF2, NTHL1, PALB2, PDGFRA, PHOX2B, PMS2, POLD1, POLE, POT1, PRKAR1A, PTCH1, PTEN, RAD50, RAD51C, RAD51D, RB1, RECQL4, RET, RUNX1, SDHAF2, SDHA (sequence changes only), SDHB, SDHC, SDHD, SMAD4, SMARCA4, SMARCB1, SMARCE1, STK11, SUFU, TERC, TERT, TMEM127, TP53, TSC1, TSC2, VHL, WRN and WT1.       HISTORY OF PRESENTING ILLNESS:  RAQUEL SAYRES 80 y.o.  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    Patient complains of worsening pain in his right hip for the last 3 weeks or so.  Denies any trauma.  Denies any pain on exertion.  Tenderness left hand in the mornings.  Not improved with NSAIDs.   Review of Systems  Constitutional:  Positive for malaise/fatigue. Negative for chills, diaphoresis and fever.  HENT:  Negative for nosebleeds and sore throat.   Eyes:  Negative for double vision.  Respiratory:  Negative for hemoptysis, sputum production, shortness of breath and wheezing.   Cardiovascular:  Negative for chest pain, palpitations, orthopnea and leg swelling.  Gastrointestinal:  Negative for abdominal pain, blood in stool, constipation, diarrhea, heartburn, melena, nausea and vomiting.  Genitourinary:  Negative for dysuria, frequency and urgency.  Musculoskeletal:  Positive for back pain and myalgias. Negative for joint pain.  Skin: Negative.  Negative  for itching and rash.  Neurological:  Negative for dizziness, tingling, focal weakness, weakness and headaches.  Endo/Heme/Allergies:   Does not bruise/bleed easily.  Psychiatric/Behavioral:  Negative for depression. The patient is not nervous/anxious and does not have insomnia.     MEDICAL HISTORY:  Past Medical History:  Diagnosis Date   Arthritis    BPH (benign prostatic hyperplasia)    BPH with obstruction/lower urinary tract symptoms    Diabetes ([facility])    Disorder resulting from impaired renal function    Elevated PSA    Family history of lung cancer    Family history of lymphoma    Heart murmur    Hypertension    Knee pain    Lesion of lung    Low HDL (under 40)    Obesity    Urinary hesitancy     SURGICAL HISTORY: Past Surgical History:  Procedure Laterality Date   HERNIA REPAIR  6195   Umbilical Hernia Repair   KNEE ARTHROSCOPY Left 04/10/2015   Procedure: ARTHROSCOPY KNEE, partial medial meniscectomy, partial synovectomy;  Surgeon: Hessie Knows, MD;  Location: [facility] ORS;  Service: Orthopedics;  Laterality: Left;    SOCIAL HISTORY: Social History   Socioeconomic History   Marital status: Widowed    Spouse name: Not on file   Number of children: Not on file   Years of education: Not on file   Highest education level: Not on file  Occupational History   Not on file  Tobacco Use   Smoking status: Every Day    Packs/day: 1.00    Pack years: 0.00    Types: Cigarettes   Smokeless tobacco: Never  Vaping Use   Vaping Use: Never used  Substance and Sexual Activity   Alcohol use: No    Alcohol/week: 0.0 standard drinks   Drug use: No   Sexual activity: Not Currently  Other Topics Concern   Not on file  Social History Narrative   Lives in [place]; self; daughter lives 1 mile. Smoke 1ppd > 65 years; stopped 25 years ago.  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    Social Determinants of Health   Financial Resource Strain: Not on file  Food Insecurity: Not on file  Transportation Needs: Not on file  Physical Activity: Not on file  Stress: Not on file  Social  Connections: Not on file  Intimate Partner Violence: Not on file    FAMILY HISTORY: Family History  Problem Relation Age of Onset   Diabetes Mother    Lung cancer Mother    Lymphoma Son 28       died in 53.    Kidney disease Neg Hx    Prostate cancer Neg Hx     ALLERGIES:  has No Known Allergies.  MEDICATIONS:  Current Outpatient Medications  Medication Sig Dispense Refill   acetaminophen (TYLENOL) 650 MG CR tablet Take 650 mg by mouth every 8 (eight) hours as needed for pain.     albuterol (PROVENTIL HFA;VENTOLIN HFA) 108 (90 BASE) MCG/ACT inhaler Inhale 2 puffs into the lungs every 6 (six) hours as needed for wheezing or shortness of breath.     aspirin 81 MG tablet Take 81 mg by mouth daily.     cyanocobalamin 2000 MCG tablet Take 1 tablet (2,000 mcg total) by mouth daily. 90 tablet 1   enzalutamide (XTANDI) 40 MG tablet Take 3 tablets (120 mg total) by mouth daily. 120 tablet 3   finasteride (PROSCAR) 5 MG tablet TAKE 1  TABLET BY MOUTH EVERY DAY (Patient taking differently: Take 5 mg by mouth daily.) 90 tablet 3   metFORMIN (GLUCOPHAGE) 1000 MG tablet Take 1,000 mg by mouth 2 (two) times daily with a meal.     thiamine 100 MG tablet Take 1 tablet (100 mg total) by mouth daily. 90 tablet 0   lisinopril (ZESTRIL) 10 MG tablet Take 1 tablet by mouth daily. (Patient not taking: Reported on 09/16/2020)     potassium chloride SA (KLOR-CON M20) 20 MEQ tablet 1 PILL TWICE A DAY (Patient not taking: No sig reported) 40 tablet 0   simvastatin (ZOCOR) 40 MG tablet Take 40 mg by mouth daily. (Patient not taking: Reported on 09/16/2020)     traMADol (ULTRAM) 50 MG tablet TAKE 1 TABLET (50 MG TOTAL) BY MOUTH EVERY 6 (SIX) HOURS AS NEEDED FOR MODERATE PAIN OR SEVERE PAIN (Patient not taking: Reported on 09/16/2020) 30 tablet 0   No current facility-administered medications for this visit.      Marland Kitchen  PHYSICAL EXAMINATION: ECOG PERFORMANCE STATUS: 0 - Asymptomatic  Vitals:   09/16/20 1409   BP: 123/65  Pulse: 86  Resp: 18  Temp: 98.2 F (36.8 C)  SpO2: 97%   Filed Weights   09/16/20 1409  Weight: 207 lb 2 oz (93.9 kg)    Physical Exam Constitutional:      Comments: Ambulating independently.  Accompanied by his daughter.  HENT:     Head: Normocephalic and atraumatic.     Mouth/Throat:     Pharynx: No oropharyngeal exudate.  Eyes:     Pupils: Pupils are equal, round, and reactive to light.  Cardiovascular:     Rate and Rhythm: Normal rate and regular rhythm.  Pulmonary:     Effort: No respiratory distress.     Breath sounds: No wheezing.     Comments: Decreased breath sounds bilaterally. Abdominal:     General: Bowel sounds are normal. There is no distension.     Palpations: Abdomen is soft. There is no mass.     Tenderness: There is no abdominal tenderness. There is no guarding or rebound.  Musculoskeletal:        General: No tenderness. Normal range of motion.     Cervical back: Normal range of motion and neck supple.  Skin:    General: Skin is warm.  Neurological:     Mental Status: He is alert and oriented to person, place, and time.  Psychiatric:        Mood and Affect: Affect normal.     LABORATORY DATA:  I have reviewed the data as listed Lab Results  Component Value Date   WBC 8.6 09/16/2020   HGB 11.8 (L) 09/16/2020   HCT 35.3 (L) 09/16/2020   MCV 86.1 09/16/2020   PLT 325 09/16/2020   Recent Labs    08/07/20 0924 09/04/20 1013 09/16/20 1359  NA 138 135 133*  K 4.4 4.0 3.8  CL 104 103 102  CO2 _0 GLUCOSE 160* 196* 278*  BUN _1 CREATININE 0.97 1.27* 0.98  CALCIUM 9.9 9.8 9.5  GFRNONAA >60 57* >60  PROT 6.9 6.9 6.9  ALBUMIN 3.8 3.7 3.6  AST 16 14* 12*  ALT _2 ALKPHOS 85 87 84  BILITOT 0.6 0.7 0.8    RADIOGRAPHIC STUDIES: I have personally reviewed the radiological images as listed and agreed with the findings in the report. No results found.  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  PROSTATE ADENOCARCINOMA- castrate sensitive; November 2021 PET scan-multiple lung lesions multiple bone lesions and prostate uptake.  On Eligard;   PSA significantly improved-STABLE  #Currently on X-tandi 120 mg/day-?  Tolerance given the hip pain [see below].  Await right hip x-ray-hip x-rays negative would recommend holding Xtandi for a month or so to see if that helps resolution of the symptoms.  Continue vitamin D started yesterday.  Hold injections today given the ongoing pain  # Right hip pain [10/10 in AM; ~ 3 weeks; no radiation; no pain pills]; recommend right hip x-ray ASAP; if negative will plan to hold off Xtandi.   #Chest tenderness-grade 1-2. Sec to ADT- continue tramadol prn; add vaseline BID-STABLE.   * Eligard-Aug 2022  # DISPOSITION: # HOLD B12/Eligard today # Follow up in [place] - 67monthMD- cbc/cmp/PSA-ELIGARD/ b12 injections- Dr.B  All questions were answered. The patient knows to call the clinic with any problems, questions or concerns.    GCammie Sickle MD 09/16/2020 2:57 PM

## 2020-09-16 NOTE — Assessment & Plan Note (Signed)
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  PROSTATE ADENOCARCINOMA- castrate sensitive; November 2021 PET scan-multiple lung lesions multiple bone lesions and prostate uptake.  On Eligard;   PSA significantly improved-STABLE  #Currently on X-tandi 120 mg/day-?  Tolerance given the hip pain [see below].  Await right hip x-ray-hip x-rays negative would recommend holding Xtandi for a month or so to see if that helps resolution of the symptoms.  Continue vitamin D started yesterday.  Hold injections today given the ongoing pain  # Right hip pain [10/10 in AM; ~ 3 weeks; no radiation; no pain pills]; recommend right hip x-ray ASAP; if negative will plan to hold off Xtandi.   #Chest tenderness-grade 1-2. Sec to ADT- continue tramadol prn; add vaseline BID-STABLE.   * Eligard-Aug 2022  # DISPOSITION: # HOLD B12/Eligard today # Follow up in [place] - 57month MD- cbc/cmp/PSA-ELIGARD/ b12 injections- Dr.B

## 2020-09-16 NOTE — Telephone Encounter (Signed)
H/T-please inform patient/daughter that hip x-ray does not show any fracture or any obvious reason for ongoing hip pain.  That showed as expected prostate cancer in the bone is not any worse.  Recommend holding prostate cancer medication/Xtandi-until next visit in August.  However if the pain does not get any better in the next 1 week or so to inform us-for further work-up.  Thanks GB

## 2020-09-16 NOTE — Progress Notes (Signed)
x

## 2020-09-17 ENCOUNTER — Encounter: Payer: Self-pay | Admitting: Internal Medicine

## 2020-09-17 LAB — CALCITRIOL (1,25 DI-OH VIT D): Vit D, 1,25-Dihydroxy: 15.8 pg/mL — ABNORMAL LOW (ref 24.8–81.5)

## 2020-09-17 MED ORDER — THIAMINE HCL 100 MG PO TABS: 100.0000 mg | ORAL_TABLET | Freq: Every day | ORAL | 0 refills | Status: DC

## 2020-09-17 MED ORDER — CYANOCOBALAMIN 2000 MCG PO TABS: 2000.0000 ug | ORAL_TABLET | Freq: Every day | ORAL | 1 refills | Status: AC

## 2020-09-17 NOTE — Telephone Encounter (Signed)
Spoke with [name], pt's daughter. Plan and results discussed with daughter. Daughter would specifically like to discuss her dad's pain with Dr. Rogue Bussing. She states that her dad "may be stating that he is in pain, but she believes that it may be pyschosomatic pain." Pt often get agitated at his daughter when she encouraged him to get out and do things. He replies, " no I'm in lots of pain." However, when the patient wants to do things with his girlfriend pt is not in pain per daughter.  Dr. Jacinto Reap - daughter would like you to personally call her today to discuss the above concerns.

## 2020-09-19 ENCOUNTER — Telehealth: Payer: Self-pay | Admitting: Internal Medicine

## 2020-09-19 NOTE — Telephone Encounter (Signed)
On 7/14-spoke to patient's daughter regarding her concerns over patient's pain.  For now recommend holding off Xtandi as discussed at last visit/especially as PSA is well controlled.  Recommend vitamin D plus calcium.  Reviewed vitamin D levels are low.  Follow-up as planned

## 2020-10-17 ENCOUNTER — Encounter: Payer: Self-pay | Admitting: Physician Assistant

## 2020-10-17 ENCOUNTER — Emergency Department
Admission: EM | Admit: 2020-10-17 | Discharge: 2020-10-17 | Disposition: A | Discharge status: Home / Self Care | Payer: Medicare Other | Attending: Emergency Medicine | Admitting: Emergency Medicine

## 2020-10-17 ENCOUNTER — Emergency Department: Payer: Medicare Other

## 2020-10-17 DIAGNOSIS — I10 Essential (primary) hypertension: Secondary | ICD-10-CM | POA: Insufficient documentation

## 2020-10-17 DIAGNOSIS — E119 Type 2 diabetes mellitus without complications: Secondary | ICD-10-CM | POA: Insufficient documentation

## 2020-10-17 DIAGNOSIS — S4992XA Unspecified injury of left shoulder and upper arm, initial encounter: Secondary | ICD-10-CM | POA: Diagnosis present

## 2020-10-17 DIAGNOSIS — Z7982 Long term (current) use of aspirin: Secondary | ICD-10-CM | POA: Insufficient documentation

## 2020-10-17 DIAGNOSIS — Z79899 Other long term (current) drug therapy: Secondary | ICD-10-CM | POA: Insufficient documentation

## 2020-10-17 DIAGNOSIS — Z8546 Personal history of malignant neoplasm of prostate: Secondary | ICD-10-CM | POA: Diagnosis not present

## 2020-10-17 DIAGNOSIS — Z7984 Long term (current) use of oral hypoglycemic drugs: Secondary | ICD-10-CM | POA: Diagnosis not present

## 2020-10-17 DIAGNOSIS — F17210 Nicotine dependence, cigarettes, uncomplicated: Secondary | ICD-10-CM | POA: Diagnosis not present

## 2020-10-17 DIAGNOSIS — S46912A Strain of unspecified muscle, fascia and tendon at shoulder and upper arm level, left arm, initial encounter: Secondary | ICD-10-CM | POA: Diagnosis not present

## 2020-10-17 MED ORDER — TRAMADOL HCL 50 MG PO TABS
50.0000 mg | ORAL_TABLET | Freq: Once | ORAL | Status: AC
  Administered 2020-10-17: 50 mg via ORAL
  Filled 2020-10-17: qty 1

## 2020-10-17 MED ORDER — TRAMADOL HCL 50 MG PO TABS: 50.0000 mg | ORAL_TABLET | Freq: Three times a day (TID) | ORAL | 0 refills | Status: DC | PRN

## 2020-10-17 NOTE — Discharge Instructions (Addendum)
Your exam and x-ray are overall reassuring as there is no x-ray evidence of any acute fracture or dislocation.  Take the pain medicine as needed and apply ice to reduce symptoms.  Follow-up with your primary provider or return to the ED if needed.
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
## 2020-10-21 ENCOUNTER — Other Ambulatory Visit: Payer: Self-pay

## 2020-10-21 ENCOUNTER — Inpatient Hospital Stay: Payer: Medicare Other | Attending: Internal Medicine

## 2020-10-21 ENCOUNTER — Inpatient Hospital Stay: Payer: Medicare Other

## 2020-10-21 ENCOUNTER — Inpatient Hospital Stay (HOSPITAL_BASED_OUTPATIENT_CLINIC_OR_DEPARTMENT_OTHER): Payer: Medicare Other | Admitting: Internal Medicine

## 2020-10-21 DIAGNOSIS — I10 Essential (primary) hypertension: Secondary | ICD-10-CM | POA: Insufficient documentation

## 2020-10-21 DIAGNOSIS — C61 Malignant neoplasm of prostate: Secondary | ICD-10-CM | POA: Diagnosis present

## 2020-10-21 DIAGNOSIS — Z79899 Other long term (current) drug therapy: Secondary | ICD-10-CM | POA: Diagnosis not present

## 2020-10-21 DIAGNOSIS — F17210 Nicotine dependence, cigarettes, uncomplicated: Secondary | ICD-10-CM | POA: Diagnosis not present

## 2020-10-21 DIAGNOSIS — C7951 Secondary malignant neoplasm of bone: Secondary | ICD-10-CM | POA: Diagnosis present

## 2020-10-21 DIAGNOSIS — Z7984 Long term (current) use of oral hypoglycemic drugs: Secondary | ICD-10-CM | POA: Diagnosis not present

## 2020-10-21 DIAGNOSIS — Z7982 Long term (current) use of aspirin: Secondary | ICD-10-CM | POA: Insufficient documentation

## 2020-10-21 DIAGNOSIS — E119 Type 2 diabetes mellitus without complications: Secondary | ICD-10-CM | POA: Insufficient documentation

## 2020-10-21 DIAGNOSIS — C7800 Secondary malignant neoplasm of unspecified lung: Secondary | ICD-10-CM | POA: Insufficient documentation

## 2020-10-21 LAB — COMPREHENSIVE METABOLIC PANEL
ALT: 9 U/L (ref 0–44)
AST: 12 U/L — ABNORMAL LOW (ref 15–41)
Albumin: 3.8 g/dL (ref 3.5–5.0)
Alkaline Phosphatase: 99 U/L (ref 38–126)
Anion gap: 9 (ref 5–15)
BUN: 12 mg/dL (ref 8–23)
CO2: 25 mmol/L (ref 22–32)
Calcium: 9.9 mg/dL (ref 8.9–10.3)
Chloride: 102 mmol/L (ref 98–111)
Creatinine, Ser: 0.86 mg/dL (ref 0.61–1.24)
GFR, Estimated: >60 mL/min (ref >60)
Glucose, Bld: 133 mg/dL — ABNORMAL HIGH (ref 70–99)
Potassium: 3.7 mmol/L (ref 3.5–5.1)
Sodium: 136 mmol/L (ref 135–145)
Total Bilirubin: 0.8 mg/dL (ref 0.3–1.2)
Total Protein: 7.2 g/dL (ref 6.5–8.1)

## 2020-10-21 LAB — CBC WITH DIFFERENTIAL/PLATELET
Abs Immature Granulocytes: 0.07 10*3/uL (ref 0.00–0.07)
Basophils Absolute: 0.1 10*3/uL (ref 0.0–0.1)
Basophils Relative: 0 %
Eosinophils Absolute: 0.2 10*3/uL (ref 0.0–0.5)
Eosinophils Relative: 2 %
HCT: 35.9 % — ABNORMAL LOW (ref 39.0–52.0)
Hemoglobin: 11.9 g/dL — ABNORMAL LOW (ref 13.0–17.0)
Immature Granulocytes: 1 %
Lymphocytes Relative: 32 %
Lymphs Abs: 4 10*3/uL (ref 0.7–4.0)
MCH: 28.9 pg (ref 26.0–34.0)
MCHC: 33.1 g/dL (ref 30.0–36.0)
MCV: 87.1 fL (ref 80.0–100.0)
Monocytes Absolute: 0.8 10*3/uL (ref 0.1–1.0)
Monocytes Relative: 7 %
Neutro Abs: 7.4 10*3/uL (ref 1.7–7.7)
Neutrophils Relative %: 58 %
Platelets: 340 10*3/uL (ref 150–400)
RBC: 4.12 MIL/uL — ABNORMAL LOW (ref 4.22–5.81)
RDW: 13.9 % (ref 11.5–15.5)
WBC: 12.6 10*3/uL — ABNORMAL HIGH (ref 4.0–10.5)
nRBC: 0 % (ref 0.0–0.2)

## 2020-10-21 MED ORDER — CYANOCOBALAMIN 1000 MCG/ML IJ SOLN
1000.0000 ug | Freq: Once | INTRAMUSCULAR | Status: AC
  Administered 2020-10-21: 1000 ug via INTRAMUSCULAR

## 2020-10-21 MED ORDER — LEUPROLIDE ACETATE (6 MONTH) 45 MG ~~LOC~~ KIT
45.0000 mg | PACK | Freq: Once | SUBCUTANEOUS | Status: AC
  Administered 2020-10-21: 45 mg via SUBCUTANEOUS

## 2020-10-21 NOTE — Assessment & Plan Note (Addendum)
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  PROSTATE ADENOCARCINOMA- castrate sensitive; November 2021 PET scan-multiple lung lesions multiple bone lesions and prostate uptake.  On Eligard;   PSA significantly improved-STABLE; but given ongoing pain- recheck PET scan.   #Re-START  X-tandi 120 mg/day [held sec to hip pain] Continue vitamin D started yesterday.   # Right hip pain [10/10 in AM; ~ 3 weeks; no radiation; no pain pills]; right hip x-ray-negative for an acute process ; await above PET scan.   #Chest tenderness-grade 1-2. Sec to ADT- continue tramadol prn; add vaseline BID-STABLE.   # DISPOSITION:  #  B12/Eligard today # Follow up in 2 weeks- MD-; no labs PET - Dr.B

## 2020-10-21 NOTE — Progress Notes (Signed)
[facility] CONSULT NOTE  Patient Care Team: McMurtrey, Estill Batten, [name], [name]    Oncology History Overview Note  # NOV 2021- prostate cancer metastatic to lung/bone [NOV PSA 900+; no biopsy].   # NOV 2021- CTA [ER] Diffuse bilateral pulmonary nodules including a cavitary lesion in the right upper lobe measuring approximately 2 cm. There is extensive nodular pleural thickening bilaterally, greatest in the left lower lobe.  November 2021 PET scan-multiple lung lesions multiple bone lesions and prostate uptake.  February 2022-prostate biopsy [Dr.Brandon]   # DM-2; NO COPD [?]; ACTIVE SMOKER 65ppd.  # DEC 2nd week- FIRMAGON; # JAN 12TH, 2022-ZYTIGA  1000 MG+ PRED 5MG; FEB MID 2022- ELIGARD q6M; MARCH 2022- DISCONTINUE ZYTIGA sec to PRESS [severe hypokalemia/mental status changes hospitalization];  #   # MAY 6th, 2022- XTnadi 3pills/day   # NGS/MOLECULAR TESTS: MARCH 2022-foundation 1 no targetable mutation./Genetic evaluation negative    # PALLIATIVE CARE EVALUATION:  # PAIN MANAGEMENT:    DIAGNOSIS: Prostate cancer  STAGE:   IV      ;  GOALS: Palliative  CURRENT/MOST RECENT THERAPY : ADT+ Xtandi    Prostate cancer metastatic to multiple sites [facility])  02/04/2020 Initial Diagnosis   Prostate cancer metastatic to multiple sites [facility])   03/19/2020 Cancer Staging   Staging form: Prostate, AJCC 8th Edition - Clinical: Stage IVB (pM1b) - Signed by Cammie Sickle, MD on 03/19/2020    Genetic Testing   Negative genetic testing. No pathogenic variants identified on the [facility] Multi-Cancer Panel. VUS in AXIN2 called c.1235A>C identified. The report date is 03/23/2020.   The Multi-Cancer Panel offered by [facility] includes sequencing and/or deletion duplication testing of the following 84 genes: AIP, ALK, APC, ATM, AXIN2,BAP1,  BARD1,  BLM, BMPR1A, BRCA1, BRCA2, BRIP1, CASR, CDC73, CDH1, CDK4, CDKN1B, CDKN1C, CDKN2A (p14ARF), CDKN2A (p16INK4a), CEBPA, CHEK2, CTNNA1, DICER1, DIS3L2, EGFR (c.2369C>T, p.Thr790Met variant only), EPCAM (Deletion/duplication testing only), FH, FLCN, GATA2, GPC3, GREM1 (Promoter region deletion/duplication testing only), HOXB13 (c.251G>A, p.Gly84Glu), HRAS, KIT, MAX, MEN1, MET, MITF (c.952G>A, p.Glu318Lys variant only), MLH1, MSH2, MSH3, MSH6, MUTYH, NBN, NF1, NF2, NTHL1, PALB2, PDGFRA, PHOX2B, PMS2, POLD1, POLE, POT1, PRKAR1A, PTCH1, PTEN, RAD50, RAD51C, RAD51D, RB1, RECQL4, RET, RUNX1, SDHAF2, SDHA (sequence changes only), SDHB, SDHC, SDHD, SMAD4, SMARCA4, SMARCB1, SMARCE1, STK11, SUFU, TERC, TERT, TMEM127, TP53, TSC1, TSC2, VHL, WRN and WT1.       HISTORY OF PRESENTING ILLNESS:  Brent Glass 80 y.o.  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    At last visit Xtandi was held for 4 weeks-given patient's concerns for hip pain secondary to [place].  Patient continues to have hip pain not any better.  X-ray within normal limits.  No nausea no vomiting.  Abdominal pain.  No chest pain.  No cough.  Review of Systems  Constitutional:  Positive for malaise/fatigue. Negative for chills, diaphoresis and fever.  HENT:  Negative for nosebleeds and sore throat.   Eyes:  Negative for double vision.  Respiratory:  Negative for hemoptysis, sputum production, shortness of breath and wheezing.   Cardiovascular:  Negative for chest pain, palpitations, orthopnea and leg swelling.  Gastrointestinal:  Negative for abdominal pain, blood in stool, constipation, diarrhea, heartburn, melena, nausea and vomiting.  Genitourinary:  Negative for dysuria, frequency and urgency.  Musculoskeletal:  Positive for back  pain and myalgias. Negative for joint pain.  Skin: Negative.  Negative for itching and rash.  Neurological:  Negative for  dizziness, tingling, focal weakness, weakness and headaches.  Endo/Heme/Allergies:  Does not bruise/bleed easily.  Psychiatric/Behavioral:  Negative for depression. The patient is not nervous/anxious and does not have insomnia.     MEDICAL HISTORY:  Past Medical History:  Diagnosis Date  . Arthritis   . BPH (benign prostatic hyperplasia)   . BPH with obstruction/lower urinary tract symptoms   . Diabetes ([place])   . Disorder resulting from impaired renal function   . Elevated PSA   . Family history of lung cancer   . Family history of lymphoma   . Heart murmur   . Hypertension   . Knee pain   . Lesion of lung   . Low HDL (under 40)   . Obesity   . Urinary hesitancy     SURGICAL HISTORY: Past Surgical History:  Procedure Laterality Date  . HERNIA REPAIR  [id number]   Umbilical Hernia Repair  . KNEE ARTHROSCOPY Left 04/10/2015   Procedure: ARTHROSCOPY KNEE, partial medial meniscectomy, partial synovectomy;  Surgeon: Hessie Knows, MD;  Location: [facility] ORS;  Service: Orthopedics;  Laterality: Left;    SOCIAL HISTORY: Social History   Socioeconomic History  . Marital status: Widowed    Spouse name: Not on file  . Number of children: Not on file  . Years of education: Not on file  . Highest education level: Not on file  Occupational History  . Not on file  Tobacco Use  . Smoking status: Every Day    Packs/day: 1.00    Types: Cigarettes  . Smokeless tobacco: Never  Vaping Use  . Vaping Use: Never used  Substance and Sexual Activity  . Alcohol use: No    Alcohol/week: 0.0 standard drinks  . Drug use: No  . Sexual activity: Not Currently  Other Topics Concern  . Not on file  Social History Narrative   Lives in [place]; self; daughter lives 1 mile. Smoke 1ppd > 65 years; stopped 25 years ago.  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    Social Determinants of Health   Financial Resource Strain: Not on file  Food Insecurity: Not on file   Transportation Needs: Not on file  Physical Activity: Not on file  Stress: Not on file  Social Connections: Not on file  Intimate Partner Violence: Not on file    FAMILY HISTORY: Family History  Problem Relation Age of Onset  . Diabetes Mother   . Lung cancer Mother   . Lymphoma Son 28       died in 38.   . Kidney disease Neg Hx   . Prostate cancer Neg Hx     ALLERGIES:  has No Known Allergies.  MEDICATIONS:  Current Outpatient Medications  Medication Sig Dispense Refill  . acetaminophen (TYLENOL) 650 MG CR tablet Take 650 mg by mouth every 8 (eight) hours as needed for pain.    Marland Kitchen albuterol (PROVENTIL HFA;VENTOLIN HFA) 108 (90 BASE) MCG/ACT inhaler Inhale 2 puffs into the lungs every 6 (six) hours as needed for wheezing or shortness of breath.    Marland Kitchen aspirin 81 MG tablet Take 81 mg by mouth daily.    . cyanocobalamin 2000 MCG tablet Take 1 tablet (2,000 mcg total) by mouth daily. 90 tablet 1  . finasteride (PROSCAR) 5 MG tablet TAKE 1 TABLET BY MOUTH EVERY DAY (Patient taking differently: Take 5 mg by mouth  daily.) 90 tablet 3  . metFORMIN (GLUCOPHAGE) 1000 MG tablet Take 1,000 mg by mouth 2 (two) times daily with a meal.    . potassium chloride SA (KLOR-CON M20) 20 MEQ tablet 1 PILL TWICE A DAY 40 tablet 0  . simvastatin (ZOCOR) 40 MG tablet Take 40 mg by mouth daily.    Marland Kitchen thiamine 100 MG tablet TAKE 1 TABLET BY MOUTH EVERY DAY 90 tablet 1  . enzalutamide (XTANDI) 40 MG tablet Take 3 tablets (120 mg total) by mouth daily. (Patient not taking: Reported on 10/21/2020) 120 tablet 3   No current facility-administered medications for this visit.      Marland Kitchen  PHYSICAL EXAMINATION: ECOG PERFORMANCE STATUS: 0 - Asymptomatic  Vitals:   10/21/20 1410  BP: 135/77  Pulse: 81  Resp: 20  Temp: (!) 97.2 F (36.2 C)    Filed Weights   10/21/20 1410  Weight: 204 lb 9.4 oz (92.8 kg)     Physical Exam Constitutional:      Comments: Ambulating independently.  Accompanied by his  daughter.  HENT:     Head: Normocephalic and atraumatic.     Mouth/Throat:     Pharynx: No oropharyngeal exudate.  Eyes:     Pupils: Pupils are equal, round, and reactive to light.  Cardiovascular:     Rate and Rhythm: Normal rate and regular rhythm.  Pulmonary:     Effort: No respiratory distress.     Breath sounds: No wheezing.     Comments: Decreased breath sounds bilaterally. Abdominal:     General: Bowel sounds are normal. There is no distension.     Palpations: Abdomen is soft. There is no mass.     Tenderness: no abdominal tenderness There is no guarding or rebound.  Musculoskeletal:        General: No tenderness. Normal range of motion.     Cervical back: Normal range of motion and neck supple.  Skin:    General: Skin is warm.  Neurological:     Mental Status: He is alert and oriented to person, place, and time.  Psychiatric:        Mood and Affect: Affect normal.     LABORATORY DATA:  I have reviewed the data as listed Lab Results  Component Value Date   WBC 12.6 (H) 10/21/2020   HGB 11.9 (L) 10/21/2020   HCT 35.9 (L) 10/21/2020   MCV 87.1 10/21/2020   PLT 340 10/21/2020   Recent Labs    09/04/20 1013 09/16/20 1359 10/21/20 1359  NA 135 133* 136  K 4.0 3.8 3.7  CL 103 102 102  CO2 23 25 25   GLUCOSE 196* 278* 133*  BUN 16 15 12   CREATININE 1.27* 0.98 0.86  CALCIUM 9.8 9.5 9.9  GFRNONAA 57* >60 >60  PROT 6.9 6.9 7.2  ALBUMIN 3.7 3.6 3.8  AST 14* 12* 12*  ALT 8 9 9   ALKPHOS 87 84 99  BILITOT 0.7 0.8 0.8    RADIOGRAPHIC STUDIES: I have personally reviewed the radiological images as listed and agreed with the findings in the report. DG Shoulder Left  Result Date: 10/17/2020 CLINICAL DATA:  Status post trauma. EXAM: LEFT SHOULDER - 2+ VIEW COMPARISON:  None. FINDINGS: There is no evidence of fracture or dislocation. Mild to moderate severity degenerative changes are seen involving the left acromioclavicular joint. Soft tissues are unremarkable.  IMPRESSION: Degenerative changes without an acute osseous abnormality. Electronically Signed   By: Virgina Norfolk M.D.   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  PROSTATE ADENOCARCINOMA- castrate sensitive; November 2021 PET scan-multiple lung lesions multiple bone lesions and prostate uptake.  On Eligard;   PSA significantly improved-STABLE; but given ongoing pain- recheck PET scan.   #Re-START  X-tandi 120 mg/day [held sec to hip pain] Continue vitamin D started yesterday.   # Right hip pain [10/10 in AM; ~ 3 weeks; no radiation; no pain pills]; right hip x-ray-negative for an acute process ; await above PET scan.   #Chest tenderness-grade 1-2. Sec to ADT- continue tramadol prn; add vaseline BID-STABLE.   # DISPOSITION:  #  B12/Eligard today # Follow up in 2 weeks- MD-; no labs PET - Dr.B  All questions were answered. The patient knows to call the clinic with any problems, questions or concerns.    Cammie Sickle, MD 10/28/2020 8:21 AM

## 2020-10-21 NOTE — Progress Notes (Signed)
Pt states that "I feel like a needle is stabbing me in my abdomen." Pt reports bm every other day. Pt denies any difficulty with urination. Pt reports  intermittent right hip pain. Patient reports intermittent headaches. Pt takes tylenol extra strength 650 mg 1 tablet daily for pain/aches.

## 2020-10-22 LAB — PSA: Prostatic Specific Antigen: 0.29 ng/mL (ref 0.00–4.00)

## 2020-10-28 ENCOUNTER — Encounter: Payer: Self-pay | Admitting: Internal Medicine

## 2020-11-04 ENCOUNTER — Ambulatory Visit: Payer: Medicare Other | Admitting: Internal Medicine

## 2020-11-04 ENCOUNTER — Ambulatory Visit
Admission: RE | Admit: 2020-11-04 | Discharge: 2020-11-04 | Disposition: A | Discharge status: Home / Self Care | Payer: Medicare Other | Source: Ambulatory Visit | Attending: Internal Medicine | Admitting: Internal Medicine

## 2020-11-04 ENCOUNTER — Other Ambulatory Visit: Payer: Self-pay

## 2020-11-04 DIAGNOSIS — R1031 Right lower quadrant pain: Secondary | ICD-10-CM | POA: Insufficient documentation

## 2020-11-04 DIAGNOSIS — R911 Solitary pulmonary nodule: Secondary | ICD-10-CM | POA: Diagnosis not present

## 2020-11-04 DIAGNOSIS — C7951 Secondary malignant neoplasm of bone: Secondary | ICD-10-CM | POA: Diagnosis not present

## 2020-11-04 DIAGNOSIS — C61 Malignant neoplasm of prostate: Secondary | ICD-10-CM | POA: Diagnosis present

## 2020-11-04 DIAGNOSIS — Z79899 Other long term (current) drug therapy: Secondary | ICD-10-CM | POA: Insufficient documentation

## 2020-11-04 DIAGNOSIS — J439 Emphysema, unspecified: Secondary | ICD-10-CM | POA: Insufficient documentation

## 2020-11-04 DIAGNOSIS — I700 Atherosclerosis of aorta: Secondary | ICD-10-CM | POA: Diagnosis not present

## 2020-11-04 MED ORDER — PIFLIFOLASTAT F 18 (PYLARIFY) INJECTION
9.0000 | Freq: Once | INTRAVENOUS | Status: AC
  Administered 2020-11-04: 9.05 via INTRAVENOUS

## 2020-11-11 ENCOUNTER — Encounter: Payer: Self-pay | Admitting: Internal Medicine

## 2020-11-11 ENCOUNTER — Inpatient Hospital Stay: Payer: Medicare Other | Attending: Internal Medicine | Admitting: Internal Medicine

## 2020-11-11 ENCOUNTER — Other Ambulatory Visit: Payer: Self-pay

## 2020-11-11 DIAGNOSIS — Z7982 Long term (current) use of aspirin: Secondary | ICD-10-CM | POA: Insufficient documentation

## 2020-11-11 DIAGNOSIS — I10 Essential (primary) hypertension: Secondary | ICD-10-CM | POA: Diagnosis not present

## 2020-11-11 DIAGNOSIS — F17210 Nicotine dependence, cigarettes, uncomplicated: Secondary | ICD-10-CM | POA: Insufficient documentation

## 2020-11-11 DIAGNOSIS — C7951 Secondary malignant neoplasm of bone: Secondary | ICD-10-CM | POA: Insufficient documentation

## 2020-11-11 DIAGNOSIS — Z79899 Other long term (current) drug therapy: Secondary | ICD-10-CM | POA: Diagnosis not present

## 2020-11-11 DIAGNOSIS — C7800 Secondary malignant neoplasm of unspecified lung: Secondary | ICD-10-CM | POA: Diagnosis not present

## 2020-11-11 DIAGNOSIS — Z7984 Long term (current) use of oral hypoglycemic drugs: Secondary | ICD-10-CM | POA: Insufficient documentation

## 2020-11-11 DIAGNOSIS — C61 Malignant neoplasm of prostate: Secondary | ICD-10-CM | POA: Insufficient documentation

## 2020-11-11 DIAGNOSIS — R1031 Right lower quadrant pain: Secondary | ICD-10-CM | POA: Diagnosis not present

## 2020-11-11 DIAGNOSIS — Z7952 Long term (current) use of systemic steroids: Secondary | ICD-10-CM | POA: Diagnosis not present

## 2020-11-11 DIAGNOSIS — E119 Type 2 diabetes mellitus without complications: Secondary | ICD-10-CM | POA: Diagnosis not present

## 2020-11-11 MED ORDER — CIPROFLOXACIN HCL 500 MG PO TABS: 500.0000 mg | ORAL_TABLET | Freq: Two times a day (BID) | ORAL | 0 refills | Status: DC

## 2020-11-11 MED ORDER — METRONIDAZOLE 500 MG PO TABS: 500.0000 mg | ORAL_TABLET | Freq: Three times a day (TID) | ORAL | 0 refills | Status: DC

## 2020-11-11 NOTE — Assessment & Plan Note (Addendum)
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  ;On Eligard;   PSA significantly improved-STABLE; PET AUG 30th, 2022- STABLE; however see discussion regarding right side colon   # continue X-tandi 120 mg/day-  Continue vitamin D.  Stable from prostate cancer standpoint.  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  Monitor for now.  # Right LQ pain [10/10 in AM; ~ 4 weeks- colo > 10 years; diverticulitis versus other causes.  Recommend follow-up with PCP/need for GI evaluation.  Colonoscopy.  Start patient on ciprofloxacin plus Flagyl.  #Chest tenderness-grade 1-2. Sec to ADT- continue tramadol prn; add vaseline BID-STABLE.   # DISPOSITION:  # Follow up in 6 weeks- MD-;labs- cbc/cmp/PSA- - Dr.B  # I reviewed the blood work- with the patient in detail; also reviewed the imaging independently [as summarized above]; and with the patient in detail.

## 2020-11-11 NOTE — Progress Notes (Signed)
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   # NOV 2021- CTA [ER] Diffuse bilateral pulmonary nodules including a cavitary lesion in the right upper lobe measuring approximately 2 cm. There is extensive nodular pleural thickening bilaterally, greatest in the left lower lobe.  November 2021 PET scan-multiple lung lesions multiple bone lesions and prostate uptake.  February 2022-prostate biopsy [Dr.Brandon]   # DM-2; NO COPD [?]; ACTIVE SMOKER 65ppd.  # DEC 2nd week- FIRMAGON; # JAN 12TH, 2022-ZYTIGA  1000 MG+ PRED 5MG; FEB MID 2022- ELIGARD q6M; MARCH 2022- DISCONTINUE ZYTIGA sec to PRESS [severe hypokalemia/mental status changes hospitalization];  #   # MAY 6th, 2022- XTnadi 3pills/day   # NGS/MOLECULAR TESTS: MARCH 2022-foundation 1 no targetable mutation./Genetic evaluation negative    # PALLIATIVE CARE EVALUATION:  # PAIN MANAGEMENT:    DIAGNOSIS: Prostate cancer  STAGE:   IV      ;  GOALS: Palliative  CURRENT/MOST RECENT THERAPY : ADT+ Xtandi    Prostate cancer metastatic to multiple sites [facility])  02/04/2020 Initial Diagnosis   Prostate cancer metastatic to multiple sites [facility])   03/19/2020 Cancer Staging   Staging form: Prostate, AJCC 8th Edition - Clinical: Stage IVB (pM1b) - Signed by Cammie Sickle, MD on 03/19/2020    Genetic Testing   Negative genetic testing. No pathogenic variants identified on the [facility] Multi-Cancer Panel. VUS in AXIN2 called c.1235A>C identified. The report date is 03/23/2020.   The Multi-Cancer Panel offered by [facility] includes sequencing and/or deletion duplication testing of the following 84 genes: AIP, ALK, APC, ATM, AXIN2,BAP1,  BARD1,  BLM, BMPR1A, BRCA1, BRCA2, BRIP1, CASR, CDC73, CDH1, CDK4, CDKN1B, CDKN1C, CDKN2A (p14ARF), CDKN2A (p16INK4a), CEBPA, CHEK2, CTNNA1, DICER1, DIS3L2, EGFR (c.2369C>T, p.Thr790Met variant only), EPCAM (Deletion/duplication testing only), FH, FLCN, GATA2, GPC3, GREM1 (Promoter region deletion/duplication testing only), HOXB13 (c.251G>A, p.Gly84Glu), HRAS, KIT, MAX, MEN1, MET, MITF (c.952G>A, p.Glu318Lys variant only), MLH1, MSH2, MSH3, MSH6, MUTYH, NBN, NF1, NF2, NTHL1, PALB2, PDGFRA, PHOX2B, PMS2, POLD1, POLE, POT1, PRKAR1A, PTCH1, PTEN, RAD50, RAD51C, RAD51D, RB1, RECQL4, RET, RUNX1, SDHAF2, SDHA (sequence changes only), SDHB, SDHC, SDHD, SMAD4, SMARCA4, SMARCB1, SMARCE1, STK11, SUFU, TERC, TERT, TMEM127, TP53, TSC1, TSC2, VHL, WRN and WT1.       HISTORY OF PRESENTING ILLNESS: Ambulating independently/accompanied by his daughter.  Brent Glass 80 y.o.  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  Patient continues to have intermittent right lower quadrant abdominal pain.  No fever no chills.  No constipation no diarrhea.  No nausea no vomiting.  Review of Systems  Constitutional:  Positive for malaise/fatigue. Negative for chills, diaphoresis and fever.  HENT:  Negative for nosebleeds and sore throat.   Eyes:  Negative for double vision.  Respiratory:  Negative for hemoptysis, sputum production, shortness of breath and wheezing.   Cardiovascular:  Negative for chest pain, palpitations, orthopnea and leg swelling.  Gastrointestinal:  Positive for abdominal pain. Negative for blood in stool, constipation, diarrhea, heartburn, melena, nausea and vomiting.  Genitourinary:  Negative for dysuria, frequency and urgency.  Musculoskeletal:  Positive for back pain and myalgias. Negative for joint pain.  Skin: Negative.  Negative for itching and rash.  Neurological:  Negative for dizziness, tingling, focal  weakness, weakness and headaches.  Endo/Heme/Allergies:  Does not bruise/bleed easily.  Psychiatric/Behavioral:  Negative for depression. The patient is not nervous/anxious and does not have insomnia.     MEDICAL HISTORY:  Past Medical History:  Diagnosis Date   Arthritis    BPH (benign prostatic hyperplasia)    BPH with obstruction/lower urinary tract symptoms    Diabetes (HCC)    Disorder resulting from impaired renal function    Elevated PSA    Family history of lung cancer    Family history of lymphoma    Heart murmur    Hypertension    Knee pain    Lesion of lung    Low HDL (under 40)    Obesity    Urinary hesitancy     SURGICAL HISTORY: Past Surgical History:  Procedure Laterality Date   HERNIA REPAIR  2012   Umbilical Hernia Repair   KNEE ARTHROSCOPY Left 04/10/2015   Procedure: ARTHROSCOPY KNEE, partial medial meniscectomy, partial synovectomy;  Surgeon: Michael Menz, MD;  Location: [facility] ORS;  Service: Orthopedics;  Laterality: Left;    SOCIAL HISTORY: Social History   Socioeconomic History   Marital status: Widowed    Spouse name: Not on file   Number of children: Not on file   Years of education: Not on file   Highest education level: Not on file  Occupational History   Not on file  Tobacco Use   Smoking status: Every Day    Packs/day: 1.00    Types: Cigarettes   Smokeless tobacco: Never  Vaping Use   Vaping Use: Never used  Substance and Sexual Activity   Alcohol use: No    Alcohol/week: 0.0 standard drinks   Drug use: No   Sexual activity: Not Currently  Other Topics Concern   Not on file  Social History Narrative   Lives in [place]; self; daughter lives 1 mile. Smoke 1ppd > 65 years; stopped 25 years ago.  Works are bus driver/part time. Exposure to Asbestos/laundry.    Social Determinants of Health   Financial Resource Strain: Not on file  Food Insecurity: Not on file  Transportation Needs: Not on file  Physical Activity: Not on file   Stress: Not on file  Social Connections: Not on file  Intimate Partner Violence: Not on file    FAMILY HISTORY: Family History  Problem Relation Age of Onset   Diabetes Mother    Lung cancer Mother    Lymphoma Son 28       died in 1994.    Kidney disease Neg Hx    Prostate cancer Neg Hx     ALLERGIES:  has No Known Allergies.  MEDICATIONS:  Current Outpatient Medications  Medication Sig Dispense Refill   acetaminophen (TYLENOL) 650 MG CR tablet Take 650 mg by mouth every 8 (eight) hours as needed for pain.     aspirin 81 MG tablet Take 81 mg by mouth daily.     ciprofloxacin (CIPRO) 500 MG tablet Take 1 tablet (500 mg total) by mouth 2 (two) times daily. 14 tablet 0   cyanocobalamin 2000 MCG tablet Take 1 tablet (2,000 mcg total) by mouth daily. 90 tablet 1   finasteride (PROSCAR) 5 MG tablet TAKE 1 TABLET BY MOUTH EVERY DAY (Patient taking differently: Take 5 mg by mouth daily.) 90 tablet 3   GLUMETZA 1000 MG 24 hr tablet Take 1,000 mg by mouth 2 (two) times daily.     metFORMIN (  GLUCOPHAGE) 1000 MG tablet Take 1,000 mg by mouth 2 (two) times daily with a meal.     metroNIDAZOLE (FLAGYL) 500 MG tablet Take 1 tablet (500 mg total) by mouth 3 (three) times daily. 21 tablet 0   rosuvastatin (CRESTOR) 20 MG tablet Take 20 mg by mouth every morning.     simvastatin (ZOCOR) 40 MG tablet Take 40 mg by mouth daily.     thiamine 100 MG tablet TAKE 1 TABLET BY MOUTH EVERY DAY 90 tablet 1   albuterol (PROVENTIL HFA;VENTOLIN HFA) 108 (90 BASE) MCG/ACT inhaler Inhale 2 puffs into the lungs every 6 (six) hours as needed for wheezing or shortness of breath.     enzalutamide (XTANDI) 40 MG tablet Take 3 tablets (120 mg total) by mouth daily. (Patient not taking: Reported on 10/21/2020) 120 tablet 3   potassium chloride SA (KLOR-CON M20) 20 MEQ tablet 1 PILL TWICE A DAY (Patient not taking: Reported on 11/11/2020) 40 tablet 0   predniSONE (DELTASONE) 5 MG tablet Take 5 mg by mouth every morning.  (Patient not taking: Reported on 11/11/2020)     TRADJENTA 5 MG TABS tablet Take 5 mg by mouth daily. (Patient not taking: Reported on 11/11/2020)     No current facility-administered medications for this visit.      Marland Kitchen  PHYSICAL EXAMINATION: ECOG PERFORMANCE STATUS: 0 - Asymptomatic  Vitals:   11/11/20 1343  BP: (!) 141/69  Pulse: 73  Resp: 18  Temp: 97.8 F (36.6 C)  SpO2: 98%    Filed Weights   11/11/20 1343  Weight: 203 lb 0.7 oz (92.1 kg)     Physical Exam HENT:     Head: Normocephalic and atraumatic.     Mouth/Throat:     Pharynx: No oropharyngeal exudate.  Eyes:     Pupils: Pupils are equal, round, and reactive to light.  Cardiovascular:     Rate and Rhythm: Normal rate and regular rhythm.  Pulmonary:     Effort: No respiratory distress.     Breath sounds: No wheezing.     Comments: Decreased breath sounds bilaterally. Abdominal:     General: Bowel sounds are normal. There is no distension.     Palpations: Abdomen is soft. There is no mass.     Tenderness: There is no abdominal tenderness. There is no guarding or rebound.  Musculoskeletal:        General: No tenderness. Normal range of motion.     Cervical back: Normal range of motion and neck supple.  Skin:    General: Skin is warm.  Neurological:     Mental Status: He is alert and oriented to person, place, and time.  Psychiatric:        Mood and Affect: Affect normal.     LABORATORY DATA:  I have reviewed the data as listed Lab Results  Component Value Date   WBC 12.6 (H) 10/21/2020   HGB 11.9 (L) 10/21/2020   HCT 35.9 (L) 10/21/2020   MCV 87.1 10/21/2020   PLT 340 10/21/2020   Recent Labs    09/04/20 1013 09/16/20 1359 10/21/20 1359  NA 135 133* 136  K 4.0 3.8 3.7  CL 103 102 102  CO2 _0 GLUCOSE 196* 278* 133*  BUN _1 CREATININE 1.27* 0.98 0.86  CALCIUM 9.8 9.5 9.9  GFRNONAA 57* >60 >60  PROT 6.9 6.9 7.2  ALBUMIN 3.7 3.6 3.8  AST 14* 12* 12*  ALT 8 9 9  ALKPHOS  87 84 99  BILITOT 0.7 0.8 0.8    RADIOGRAPHIC STUDIES: I have personally reviewed the radiological images as listed and agreed with the findings in the report. DG Shoulder Left  Result Date: 10/17/2020 CLINICAL DATA:  Status post trauma. EXAM: LEFT SHOULDER - 2+ VIEW COMPARISON:  None. FINDINGS: There is no evidence of fracture or dislocation. Mild to moderate severity degenerative changes are seen involving the left acromioclavicular joint. Soft tissues are unremarkable. IMPRESSION: Degenerative changes without an acute osseous abnormality. Electronically Signed   By: Virgina Norfolk M.D.   On: 10/17/2020 19:42   NM PET (PSMA) SKULL TO MID THIGH  Result Date: 11/06/2020 CLINICAL DATA:  Prostate cancer, metastatic disease to multiple sites. Exam for treatment response assessment in an 80 year old male. EXAM: NUCLEAR MEDICINE PET SKULL BASE TO THIGH TECHNIQUE: 9.05 mCi F18 Piflufolastat (Pylarify) was injected intravenously. Full-ring PET imaging was performed from the skull base to thigh after the radiotracer. CT data was obtained and used for attenuation correction and anatomic localization. COMPARISON:  Comparison made with conventional PET examination from November of 2021. FINDINGS: NECK Choose 1 Incidental CT finding: None CHEST Resolution of pulmonary nodules that were seen on the previous exam with the exception of a cystic area with surrounding rim of ground-glass and soft tissue on image 101/3 this area shows minimal radiotracer accumulation with a maximum SUV of 1.3 and is stable at 1.9 x 1.5 cm perhaps showing slightly less soft tissue at the margin. No effusion or consolidation with patent airways. No adenopathy with radiotracer accumulation. With a Incidental CT finding: Calcified atheromatous plaque in the thoracic aorta. 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 Normal heart size without substantial pericardial fluid. Normal appearance of the esophagus. Background  pulmonary emphysema. ABDOMEN/PELVIS Prostate: Mild heterogeneous uptake throughout the prostate. Maximum SUV of 3.36 in the anterior gland. No focus of intense radiotracer accumulation. Lymph nodes: No radiotracer accumulation in lymph nodes. Lymph nodes that were previously borderline enlarged but showed FDG uptake have resolved in the interim. Liver: No evidence of liver metastasis Incidental CT finding: Liver and gallbladder are unremarkable. Pancreas without suspicious finding. Spleen normal size and contour. Nodular LEFT adrenal gland is unchanged. No hydronephrosis or perinephric stranding. Diverticula arising from the urinary bladder. Pancolonic diverticulosis with RIGHT-sided diverticulosis and signs of colonic thickening along the ascending colon and at the cecum versus under distension. No significant surrounding stranding the appendix is normal. Stool fills much of the colon. No pneumatosis. No free air. Smooth contour of the aorta. SKELETON Diffuse sclerotic changes throughout the visualized axial and appendicular skeleton, every level of the spine now shows sclerosis. Bilateral femora and pelvis with diffuse sclerotic changes. Every rib with involvement and involvement of the sternum. Sclerotic foci in the skull base involving the clivus. Many of these were not evident on the previous study most present with very subtle changes of sclerosis, for instance in the LEFT proximal femur there was subtle sclerosis on the prior study now heterogeneous and predominantly sclerotic changes have developed in the interim. At the level of the LEFT femoral neck radiotracer accumulation with maximum SUV of 1.47 and most other areas showing similar or less radiotracer accumulation. (Image 79/3) RIGHT sternal manubrium maximum SUV of 1.63. At the L1 level there is a focus of sclerosis in the LEFT lateral aspect of the vertebral body with a maximum SUV of 3.31 Sclerotic lesions of the skull base without significant  radiotracer accumulation. IMPRESSION: Little if any radiotracer accumulation  in the neck, chest, abdomen or pelvis with near complete resolution of pulmonary nodules and limited heterogeneous activity within bony metastases and prostate bed as described. Diffusely sclerotic skeletal metastatic lesions with limited radiotracer accumulation may reflect treated disease. Mild-to-moderate uptake persists in L1, significance uncertain in the absence of baseline imaging. Attention on follow-up. Pulmonary nodule in the RIGHT lung in the RIGHT upper lobe with cystic features and surrounding ground-glass, morphologic characteristics could be seen in the setting of bronchogenic neoplasm. Consider short interval follow-up CT of the chest and continued surveillance. In a patient with RIGHT lower quadrant pain there is mild RIGHT colonic thickening in the setting of diverticular disease. Not evident on the previous exam. The appendix is normal. This may represent low level inflammation/diverticulitis. Correlation with symptoms may be helpful. Given colonic thickening could consider short interval follow-up and correlation with Cologuard with direct visualization as clinically warranted. Aortic Atherosclerosis (ICD10-I70.0) and Emphysema (ICD10-J43.9). Electronically Signed   By: Geoffrey  Wile M.D.   On: 11/06/2020 08:48    ASSESSMENT & PLAN:   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) # prostate cancer castrate sensitive metastatic to lung/bone [NOV PSA 900].  ;On Eligard;   PSA significantly improved-STABLE; PET AUG 30th, 2022- STABLE; however see discussion regarding right side colon   # continue X-tandi 120 mg/day-  Continue vitamin D.  Stable from prostate cancer standpoint.  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  Monitor for now.  # Right LQ pain [10/10 in AM; ~ 4 weeks- colo > 10 years; diverticulitis versus other causes.   Recommend follow-up with PCP/need for GI evaluation.  Colonoscopy.  Start patient on ciprofloxacin plus Flagyl.  #Chest tenderness-grade 1-2. Sec to ADT- continue tramadol prn; add vaseline BID-STABLE.   # DISPOSITION:  # Follow up in 6 weeks- MD-;labs- cbc/cmp/PSA- - Dr.B  # I reviewed the blood work- with the patient in detail; also reviewed the imaging independently [as summarized above]; and with the patient in detail.    All questions were answered. The patient knows to call the clinic with any problems, questions or concerns.    Govinda R Brahmanday, MD 11/11/2020 4:02 PM     

## 2020-12-23 ENCOUNTER — Other Ambulatory Visit: Payer: Self-pay

## 2020-12-23 ENCOUNTER — Inpatient Hospital Stay: Payer: Medicare Other | Attending: Internal Medicine

## 2020-12-23 ENCOUNTER — Inpatient Hospital Stay (HOSPITAL_BASED_OUTPATIENT_CLINIC_OR_DEPARTMENT_OTHER): Payer: Medicare Other | Admitting: Internal Medicine

## 2020-12-23 DIAGNOSIS — I10 Essential (primary) hypertension: Secondary | ICD-10-CM | POA: Insufficient documentation

## 2020-12-23 DIAGNOSIS — F17210 Nicotine dependence, cigarettes, uncomplicated: Secondary | ICD-10-CM | POA: Insufficient documentation

## 2020-12-23 DIAGNOSIS — E119 Type 2 diabetes mellitus without complications: Secondary | ICD-10-CM | POA: Diagnosis not present

## 2020-12-23 DIAGNOSIS — Z7984 Long term (current) use of oral hypoglycemic drugs: Secondary | ICD-10-CM | POA: Insufficient documentation

## 2020-12-23 DIAGNOSIS — C61 Malignant neoplasm of prostate: Secondary | ICD-10-CM | POA: Insufficient documentation

## 2020-12-23 DIAGNOSIS — C7951 Secondary malignant neoplasm of bone: Secondary | ICD-10-CM | POA: Insufficient documentation

## 2020-12-23 DIAGNOSIS — C7800 Secondary malignant neoplasm of unspecified lung: Secondary | ICD-10-CM | POA: Insufficient documentation

## 2020-12-23 DIAGNOSIS — Z79899 Other long term (current) drug therapy: Secondary | ICD-10-CM | POA: Diagnosis not present

## 2020-12-23 DIAGNOSIS — Z7982 Long term (current) use of aspirin: Secondary | ICD-10-CM | POA: Diagnosis not present

## 2020-12-23 DIAGNOSIS — Z7952 Long term (current) use of systemic steroids: Secondary | ICD-10-CM | POA: Insufficient documentation

## 2020-12-23 LAB — CBC WITH DIFFERENTIAL/PLATELET
Abs Immature Granulocytes: 0.02 10*3/uL (ref 0.00–0.07)
Basophils Absolute: 0.1 10*3/uL (ref 0.0–0.1)
Basophils Relative: 1 %
Eosinophils Absolute: 0.2 10*3/uL (ref 0.0–0.5)
Eosinophils Relative: 2 %
HCT: 39.4 % (ref 39.0–52.0)
Hemoglobin: 12.9 g/dL — ABNORMAL LOW (ref 13.0–17.0)
Immature Granulocytes: 0 %
Lymphocytes Relative: 40 %
Lymphs Abs: 4 10*3/uL (ref 0.7–4.0)
MCH: 28.9 pg (ref 26.0–34.0)
MCHC: 32.7 g/dL (ref 30.0–36.0)
MCV: 88.1 fL (ref 80.0–100.0)
Monocytes Absolute: 0.7 10*3/uL (ref 0.1–1.0)
Monocytes Relative: 7 %
Neutro Abs: 4.9 10*3/uL (ref 1.7–7.7)
Neutrophils Relative %: 50 %
Platelets: 339 10*3/uL (ref 150–400)
RBC: 4.47 MIL/uL (ref 4.22–5.81)
RDW: 13.6 % (ref 11.5–15.5)
WBC: 9.9 10*3/uL (ref 4.0–10.5)
nRBC: 0 % (ref 0.0–0.2)

## 2020-12-23 LAB — COMPREHENSIVE METABOLIC PANEL
ALT: 9 U/L (ref 0–44)
AST: 12 U/L — ABNORMAL LOW (ref 15–41)
Albumin: 3.9 g/dL (ref 3.5–5.0)
Alkaline Phosphatase: 86 U/L (ref 38–126)
Anion gap: 9 (ref 5–15)
BUN: 14 mg/dL (ref 8–23)
CO2: 25 mmol/L (ref 22–32)
Calcium: 9.9 mg/dL (ref 8.9–10.3)
Chloride: 101 mmol/L (ref 98–111)
Creatinine, Ser: 0.94 mg/dL (ref 0.61–1.24)
GFR, Estimated: >60 mL/min (ref >60)
Glucose, Bld: 138 mg/dL — ABNORMAL HIGH (ref 70–99)
Potassium: 3.9 mmol/L (ref 3.5–5.1)
Sodium: 135 mmol/L (ref 135–145)
Total Bilirubin: 0.5 mg/dL (ref 0.3–1.2)
Total Protein: 7.2 g/dL (ref 6.5–8.1)

## 2020-12-23 LAB — PSA: Prostatic Specific Antigen: 0.28 ng/mL (ref 0.00–4.00)

## 2020-12-23 NOTE — Progress Notes (Signed)
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   # NOV 2021- CTA [ER] Diffuse bilateral pulmonary nodules including a cavitary lesion in the right upper lobe measuring approximately 2 cm. There is extensive nodular pleural thickening bilaterally, greatest in the left lower lobe.  November 2021 PET scan-multiple lung lesions multiple bone lesions and prostate uptake.  February 2022-prostate biopsy [Dr.Brandon]   # DM-2; NO COPD [?]; ACTIVE SMOKER 65ppd.  # DEC 2nd week- FIRMAGON; # JAN 12TH, 2022-ZYTIGA  1000 MG+ PRED 5MG; FEB MID 2022- ELIGARD q6M; MARCH 2022- DISCONTINUE ZYTIGA sec to PRESS [severe hypokalemia/mental status changes hospitalization];  #   # MAY 6th, 2022- XTnadi 3pills/day   # NGS/MOLECULAR TESTS: MARCH 2022-foundation 1 no targetable mutation./Genetic evaluation negative    # PALLIATIVE CARE EVALUATION:  # PAIN MANAGEMENT:    DIAGNOSIS: Prostate cancer  STAGE:   IV      ;  GOALS: Palliative  CURRENT/MOST RECENT THERAPY : ADT+ Xtandi    Prostate cancer metastatic to multiple sites [facility])  02/04/2020 Initial Diagnosis   Prostate cancer metastatic to multiple sites [facility])   03/19/2020 Cancer Staging   Staging form: Prostate, AJCC 8th Edition - Clinical: Stage IVB (pM1b) - Signed by Cammie Sickle, MD on 03/19/2020    Genetic Testing   Negative genetic testing. No pathogenic variants identified on the [facility] Multi-Cancer Panel. VUS in AXIN2 called c.1235A>C identified. The report date is 03/23/2020.   The Multi-Cancer Panel offered by [facility] includes sequencing and/or deletion duplication testing of the following 84 genes: AIP, ALK, APC, ATM, AXIN2,BAP1,  BARD1,  BLM, BMPR1A, BRCA1, BRCA2, BRIP1, CASR, CDC73, CDH1, CDK4, CDKN1B, CDKN1C, CDKN2A (p14ARF), CDKN2A (p16INK4a), CEBPA, CHEK2, CTNNA1, DICER1, DIS3L2, EGFR (c.2369C>T, p.Thr790Met variant only), EPCAM (Deletion/duplication testing only), FH, FLCN, GATA2, GPC3, GREM1 (Promoter region deletion/duplication testing only), HOXB13 (c.251G>A, p.Gly84Glu), HRAS, KIT, MAX, MEN1, MET, MITF (c.952G>A, p.Glu318Lys variant only), MLH1, MSH2, MSH3, MSH6, MUTYH, NBN, NF1, NF2, NTHL1, PALB2, PDGFRA, PHOX2B, PMS2, POLD1, POLE, POT1, PRKAR1A, PTCH1, PTEN, RAD50, RAD51C, RAD51D, RB1, RECQL4, RET, RUNX1, SDHAF2, SDHA (sequence changes only), SDHB, SDHC, SDHD, SMAD4, SMARCA4, SMARCB1, SMARCE1, STK11, SUFU, TERC, TERT, TMEM127, TP53, TSC1, TSC2, VHL, WRN and WT1.       HISTORY OF PRESENTING ILLNESS: Ambulating independently/accompanied by his daughter.  Brent Glass 80 y.o.  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  The interim patient was evaluated by PCP regarding diverticulitis.  Patient s/p antibiotics.  Noted to have improvement but not resolution of his pain.   No fever no chills.  No constipation no diarrhea.  No nausea no vomiting.  Review of Systems  Constitutional:  Positive for malaise/fatigue. Negative for chills, diaphoresis and fever.  HENT:  Negative for nosebleeds and sore throat.   Eyes:  Negative for double vision.  Respiratory:  Negative for hemoptysis, sputum production, shortness of breath and wheezing.   Cardiovascular:  Negative for chest pain, palpitations, orthopnea and leg swelling.  Gastrointestinal:  Positive for abdominal pain. Negative for blood in stool, constipation, diarrhea, heartburn, melena, nausea and vomiting.  Genitourinary:  Negative for dysuria, frequency and urgency.  Musculoskeletal:  Positive for back pain and myalgias. Negative for joint pain.  Skin: Negative.  Negative for itching and rash.  Neurological:   Negative for dizziness, tingling, focal weakness, weakness and headaches.  Endo/Heme/Allergies:  Does not bruise/bleed easily.  Psychiatric/Behavioral:  Negative for depression. The patient is not nervous/anxious and does not have insomnia.     MEDICAL HISTORY:  Past Medical History:  Diagnosis Date  . Arthritis   . BPH (benign prostatic hyperplasia)   . BPH with obstruction/lower urinary tract symptoms   . Diabetes ([place])   . Disorder resulting from impaired renal function   . Elevated PSA   . Family history of lung cancer   . Family history of lymphoma   . Heart murmur   . Hypertension   . Knee pain   . Lesion of lung   . Low HDL (under 40)   . Obesity   . Urinary hesitancy     SURGICAL HISTORY: Past Surgical History:  Procedure Laterality Date  . HERNIA REPAIR  7846   Umbilical Hernia Repair  . KNEE ARTHROSCOPY Left 04/10/2015   Procedure: ARTHROSCOPY KNEE, partial medial meniscectomy, partial synovectomy;  Surgeon: Hessie Knows, MD;  Location: [facility] ORS;  Service: Orthopedics;  Laterality: Left;    SOCIAL HISTORY: Social History   Socioeconomic History  . Marital status: Widowed    Spouse name: Not on file  . Number of children: Not on file  . Years of education: Not on file  . Highest education level: Not on file  Occupational History  . Not on file  Tobacco Use  . Smoking status: Every Day    Packs/day: 1.00    Types: Cigarettes  . Smokeless tobacco: Never  Vaping Use  . Vaping Use: Never used  Substance and Sexual Activity  . Alcohol use: No    Alcohol/week: 0.0 standard drinks  . Drug use: No  . Sexual activity: Not Currently  Other Topics Concern  . Not on file  Social History Narrative   Lives in [place]; self; daughter lives 1 mile. Smoke 1ppd > 65 years; stopped 25 years ago.  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    Social Determinants of Health   Financial Resource Strain: Not on file  Food Insecurity: Not on file   Transportation Needs: Not on file  Physical Activity: Not on file  Stress: Not on file  Social Connections: Not on file  Intimate Partner Violence: Not on file    FAMILY HISTORY: Family History  Problem Relation Age of Onset  . Diabetes Mother   . Lung cancer Mother   . Lymphoma Son 28       died in 66.   . Kidney disease Neg Hx   . Prostate cancer Neg Hx     ALLERGIES:  has No Known Allergies.  MEDICATIONS:  Current Outpatient Medications  Medication Sig Dispense Refill  . acetaminophen (TYLENOL) 650 MG CR tablet Take 650 mg by mouth every 8 (eight) hours as needed for pain.    Marland Kitchen albuterol (PROVENTIL HFA;VENTOLIN HFA) 108 (90 BASE) MCG/ACT inhaler Inhale 2 puffs into the lungs every 6 (six) hours as needed for wheezing or shortness of breath.    Marland Kitchen aspirin 81 MG tablet Take 81 mg by mouth daily.    . ciprofloxacin (CIPRO) 500 MG tablet Take 1 tablet (500 mg total) by mouth 2 (two) times daily. 14 tablet 0  . cyanocobalamin 2000 MCG tablet Take 1 tablet (2,000 mcg total) by mouth daily. 90 tablet 1  . finasteride (PROSCAR) 5 MG tablet TAKE  1 TABLET BY MOUTH EVERY DAY (Patient taking differently: Take 5 mg by mouth daily.) 90 tablet 3  . GLUMETZA 1000 MG 24 hr tablet Take 1,000 mg by mouth 2 (two) times daily.    . metFORMIN (GLUCOPHAGE) 1000 MG tablet Take 1,000 mg by mouth 2 (two) times daily with a meal.    . metroNIDAZOLE (FLAGYL) 500 MG tablet Take 1 tablet (500 mg total) by mouth 3 (three) times daily. 21 tablet 0  . rosuvastatin (CRESTOR) 20 MG tablet Take 20 mg by mouth every morning.    . simvastatin (ZOCOR) 40 MG tablet Take 40 mg by mouth daily.    Marland Kitchen thiamine 100 MG tablet TAKE 1 TABLET BY MOUTH EVERY DAY 90 tablet 1  . enzalutamide (XTANDI) 40 MG tablet Take 3 tablets (120 mg total) by mouth daily. (Patient not taking: No sig reported) 120 tablet 3  . potassium chloride SA (KLOR-CON M20) 20 MEQ tablet 1 PILL TWICE A DAY (Patient not taking: No sig reported) 40 tablet  0  . predniSONE (DELTASONE) 5 MG tablet Take 5 mg by mouth every morning. (Patient not taking: No sig reported)    . TRADJENTA 5 MG TABS tablet Take 5 mg by mouth daily. (Patient not taking: No sig reported)     No current facility-administered medications for this visit.      Marland Kitchen  PHYSICAL EXAMINATION: ECOG PERFORMANCE STATUS: 0 - Asymptomatic  Vitals:   12/23/20 1354  BP: (!) 141/75  Pulse: 80  Resp: 18  Temp: 98 F (36.7 C)  SpO2: 99%     Filed Weights   12/23/20 1352  Weight: 197 lb (89.4 kg)     Physical Exam HENT:     Head: Normocephalic and atraumatic.     Mouth/Throat:     Pharynx: No oropharyngeal exudate.  Eyes:     Pupils: Pupils are equal, round, and reactive to light.  Cardiovascular:     Rate and Rhythm: Normal rate and regular rhythm.  Pulmonary:     Effort: No respiratory distress.     Breath sounds: No wheezing.     Comments: Decreased breath sounds bilaterally. Abdominal:     General: Bowel sounds are normal. There is no distension.     Palpations: Abdomen is soft. There is no mass.     Tenderness: There is no abdominal tenderness. There is no guarding or rebound.  Musculoskeletal:        General: No tenderness. Normal range of motion.     Cervical back: Normal range of motion and neck supple.  Skin:    General: Skin is warm.  Neurological:     Mental Status: He is alert and oriented to person, place, and time.  Psychiatric:        Mood and Affect: Affect normal.     LABORATORY DATA:  I have reviewed the data as listed Lab Results  Component Value Date   WBC 9.9 12/23/2020   HGB 12.9 (L) 12/23/2020   HCT 39.4 12/23/2020   MCV 88.1 12/23/2020   PLT 339 12/23/2020   Recent Labs    09/16/20 1359 10/21/20 1359 12/23/20 1327  NA 133* 136 135  K 3.8 3.7 3.9  CL 102 102 101  CO2 25 25 25   GLUCOSE 278* 133* 138*  BUN 15 12 14   CREATININE 0.98 0.86 0.94  CALCIUM 9.5 9.9 9.9  GFRNONAA >60 >60 >60  PROT 6.9 7.2 7.2  ALBUMIN 3.6  3.8 3.9  AST 12* 12*  12*  ALT 9 9 9   ALKPHOS 84 99 86  BILITOT 0.8 0.8 0.5    RADIOGRAPHIC STUDIES: I have personally reviewed the radiological images as listed and agreed with the findings in the report. No results found.  ASSESSMENT & PLAN:   Prostate cancer metastatic to multiple sites [facility]) # prostate cancer castrate sensitive metastatic to lung/bone [NOV PSA 900].  ;On Eligard;   PSA significantly improved-STABLE; PET AUG 30th, 2022- STABLE; however see discussion regarding right side colon   # continue X-tandi 120 mg/day-  Continue vitamin D.  STABLE.   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  Monitor for now.  # Right LQ pain [3-4/10 in AM; ~ 4 weeks- colo > 10 years; diverticulitis versus other causes. S/P- ciprofloxacin plus Flagyl. S/p follow-up with PCP. Awaiting GI evaluation with Colonoscopy.  Recommend follow-up with PCP regarding GI evaluation.  #Chest tenderness-grade 1-2. Sec to ADT- continue tramadol prn; add vaseline BID-STABLE.   # DISPOSITION:  # Follow up in 3 months - MD-;labs- cbc/cmp/PSA- - Dr.B    All questions were answered. The patient knows to call the clinic with any problems, questions or concerns.    Cammie Sickle, MD 12/23/2020 4:42 PM

## 2020-12-23 NOTE — Assessment & Plan Note (Addendum)
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  ;On Eligard;   PSA significantly improved-STABLE; PET AUG 30th, 2022- STABLE; however see discussion regarding right side colon   # continue X-tandi 120 mg/day-  Continue vitamin D.  STABLE.   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  Monitor for now.  # Right LQ pain [3-4/10 in AM; ~ 4 weeks- colo > 10 years; diverticulitis versus other causes. S/P- ciprofloxacin plus Flagyl. S/p follow-up with PCP. Awaiting GI evaluation with Colonoscopy.  Recommend follow-up with PCP regarding GI evaluation.  #Chest tenderness-grade 1-2. Sec to ADT- continue tramadol prn; add vaseline BID-STABLE.   # DISPOSITION:  # Follow up in 3 months - MD-;labs- cbc/cmp/PSA- - Dr.B

## 2021-01-23 ENCOUNTER — Telehealth: Payer: Self-pay | Admitting: Pharmacy Technician

## 2021-02-06 NOTE — Telephone Encounter (Signed)
Oral Oncology Patient Advocate Encounter  Was successful in securing patient a $8000 grant from [employer] to provide copayment coverage for Brent Glass.  This will keep the out of pocket expense at $0.     [employer] ID: [id number]  I have spoken with the patient.   The billing information is as follows and has been shared with [place].    RxBin: [id number] PCN: [id number] Member ID: [id number] Group ID: [id number] Dates of Eligibility: 12/24/20 through 12/23/21  Fund:  [name] [place] Phone [phone] Fax [phone] 02/06/2021 4:05 PM

## 2021-03-13 ENCOUNTER — Other Ambulatory Visit: Payer: Self-pay

## 2021-03-13 ENCOUNTER — Emergency Department: Payer: Medicare Other

## 2021-03-13 DIAGNOSIS — R079 Chest pain, unspecified: Secondary | ICD-10-CM | POA: Diagnosis not present

## 2021-03-13 DIAGNOSIS — Z20822 Contact with and (suspected) exposure to covid-19: Secondary | ICD-10-CM | POA: Diagnosis not present

## 2021-03-13 DIAGNOSIS — R0602 Shortness of breath: Secondary | ICD-10-CM | POA: Diagnosis not present

## 2021-03-13 LAB — RESP PANEL BY RT-PCR (FLU A&B, COVID) ARPGX2
Influenza A by PCR: NEGATIVE
Influenza B by PCR: NEGATIVE
SARS Coronavirus 2 by RT PCR: NEGATIVE

## 2021-03-13 LAB — COMPREHENSIVE METABOLIC PANEL
ALT: 7 U/L (ref 0–44)
AST: 9 U/L — ABNORMAL LOW (ref 15–41)
Albumin: 3.8 g/dL (ref 3.5–5.0)
Alkaline Phosphatase: 101 U/L (ref 38–126)
Anion gap: 7 (ref 5–15)
BUN: 18 mg/dL (ref 8–23)
CO2: 23 mmol/L (ref 22–32)
Calcium: 9.9 mg/dL (ref 8.9–10.3)
Chloride: 105 mmol/L (ref 98–111)
Creatinine, Ser: 0.83 mg/dL (ref 0.61–1.24)
GFR, Estimated: >60 mL/min (ref >60)
Glucose, Bld: 110 mg/dL — ABNORMAL HIGH (ref 70–99)
Potassium: 4 mmol/L (ref 3.5–5.1)
Sodium: 135 mmol/L (ref 135–145)
Total Bilirubin: 0.7 mg/dL (ref 0.3–1.2)
Total Protein: 7.6 g/dL (ref 6.5–8.1)

## 2021-03-13 LAB — CBC WITH DIFFERENTIAL/PLATELET
Abs Immature Granulocytes: 0.02 10*3/uL (ref 0.00–0.07)
Basophils Absolute: 0.1 10*3/uL (ref 0.0–0.1)
Basophils Relative: 1 %
Eosinophils Absolute: 0.2 10*3/uL (ref 0.0–0.5)
Eosinophils Relative: 2 %
HCT: 38.5 % — ABNORMAL LOW (ref 39.0–52.0)
Hemoglobin: 12.6 g/dL — ABNORMAL LOW (ref 13.0–17.0)
Immature Granulocytes: 0 %
Lymphocytes Relative: 40 %
Lymphs Abs: 4.2 10*3/uL — ABNORMAL HIGH (ref 0.7–4.0)
MCH: 28.7 pg (ref 26.0–34.0)
MCHC: 32.7 g/dL (ref 30.0–36.0)
MCV: 87.7 fL (ref 80.0–100.0)
Monocytes Absolute: 0.6 10*3/uL (ref 0.1–1.0)
Monocytes Relative: 6 %
Neutro Abs: 5.5 10*3/uL (ref 1.7–7.7)
Neutrophils Relative %: 51 %
Platelets: 393 10*3/uL (ref 150–400)
RBC: 4.39 MIL/uL (ref 4.22–5.81)
RDW: 13.9 % (ref 11.5–15.5)
WBC: 10.6 10*3/uL — ABNORMAL HIGH (ref 4.0–10.5)
nRBC: 0 % (ref 0.0–0.2)

## 2021-03-13 LAB — TROPONIN I (HIGH SENSITIVITY): Troponin I (High Sensitivity): 6 ng/L (ref <18)

## 2021-03-13 NOTE — ED Triage Notes (Signed)
Pt to ER via POV with complaints of centralized non-radiating chest pain for the last month. Reports that the pain intermittently worsens. Describes chest feels sore all the time. Reports some SHOB. Denies cardiac history.

## 2021-03-13 NOTE — ED Provider Triage Note (Signed)
°  Emergency Medicine Provider Triage Evaluation Note  Brent Glass , a 81 y.o.male,  was evaluated in triage.  Pt complains of chest pain.  Reports stabbing sensation in the center of his chest has been intermittent for the past month, though reportedly worse today.  Endorses some shortness of breath.  Denies abdominal pain, back pain, urinary symptoms, fever/chills.   Review of Systems  Positive: Chest pain, shortness of breath Negative: Denies fever, abdominal pain vomiting  Physical Exam  There were no vitals filed for this visit. Gen:   Awake, no distress   Resp:  Normal effort  MSK:   Moves extremities without difficulty  Other:    Medical Decision Making  Given the patient's initial medical screening exam, the following diagnostic evaluation has been ordered. The patient will be placed in the appropriate treatment space, once one is available, to complete the evaluation and treatment. I have discussed the plan of care with the patient and I have advised the patient that an ED physician or mid-level practitioner will reevaluate their condition after the test results have been received, as the results may give them additional insight into the type of treatment they may need.    Diagnostics: Labs, CXR, EKG, respiratory panel.  Treatments: none immediately   Teodoro Spray, [place] 03/13/21 1615

## 2021-03-14 ENCOUNTER — Emergency Department
Admission: EM | Admit: 2021-03-14 | Discharge: 2021-03-14 | Disposition: A | Discharge status: Home / Self Care | Payer: Medicare Other | Attending: Student in an Organized Health Care Education/Training Program | Admitting: Student in an Organized Health Care Education/Training Program

## 2021-03-25 ENCOUNTER — Inpatient Hospital Stay: Payer: Medicare Other | Admitting: Internal Medicine

## 2021-03-25 ENCOUNTER — Inpatient Hospital Stay: Payer: Medicare Other | Attending: Internal Medicine

## 2021-05-23 ENCOUNTER — Emergency Department: Payer: Medicare Other

## 2021-05-23 ENCOUNTER — Other Ambulatory Visit: Payer: Self-pay

## 2021-05-23 ENCOUNTER — Emergency Department
Admission: EM | Admit: 2021-05-23 | Discharge: 2021-05-23 | Disposition: A | Discharge status: Home / Self Care | Payer: Medicare Other | Attending: Emergency Medicine | Admitting: Emergency Medicine

## 2021-05-23 DIAGNOSIS — R0789 Other chest pain: Secondary | ICD-10-CM

## 2021-05-23 DIAGNOSIS — Z8546 Personal history of malignant neoplasm of prostate: Secondary | ICD-10-CM | POA: Insufficient documentation

## 2021-05-23 DIAGNOSIS — R072 Precordial pain: Secondary | ICD-10-CM | POA: Insufficient documentation

## 2021-05-23 DIAGNOSIS — I10 Essential (primary) hypertension: Secondary | ICD-10-CM | POA: Insufficient documentation

## 2021-05-23 DIAGNOSIS — E119 Type 2 diabetes mellitus without complications: Secondary | ICD-10-CM | POA: Diagnosis not present

## 2021-05-23 LAB — TROPONIN I (HIGH SENSITIVITY)
Troponin I (High Sensitivity): 8 ng/L (ref <18)
Troponin I (High Sensitivity): 8 ng/L (ref <18)

## 2021-05-23 LAB — BASIC METABOLIC PANEL
Anion gap: 8 (ref 5–15)
BUN: 12 mg/dL (ref 8–23)
CO2: 25 mmol/L (ref 22–32)
Calcium: 9.7 mg/dL (ref 8.9–10.3)
Chloride: 105 mmol/L (ref 98–111)
Creatinine, Ser: 1.05 mg/dL (ref 0.61–1.24)
GFR, Estimated: >60 mL/min (ref >60)
Glucose, Bld: 165 mg/dL — ABNORMAL HIGH (ref 70–99)
Potassium: 4 mmol/L (ref 3.5–5.1)
Sodium: 138 mmol/L (ref 135–145)

## 2021-05-23 LAB — CBC
HCT: 40.2 % (ref 39.0–52.0)
Hemoglobin: 12.7 g/dL — ABNORMAL LOW (ref 13.0–17.0)
MCH: 27.9 pg (ref 26.0–34.0)
MCHC: 31.6 g/dL (ref 30.0–36.0)
MCV: 88.2 fL (ref 80.0–100.0)
Platelets: 342 10*3/uL (ref 150–400)
RBC: 4.56 MIL/uL (ref 4.22–5.81)
RDW: 14.7 % (ref 11.5–15.5)
WBC: 10.5 10*3/uL (ref 4.0–10.5)
nRBC: 0 % (ref 0.0–0.2)

## 2021-05-23 MED ORDER — ALUM & MAG HYDROXIDE-SIMETH 200-200-20 MG/5ML PO SUSP
30.0000 mL | Freq: Once | ORAL | Status: AC
  Administered 2021-05-23: 30 mL via ORAL
  Filled 2021-05-23: qty 30

## 2021-05-23 MED ORDER — FAMOTIDINE 20 MG PO TABS
40.0000 mg | ORAL_TABLET | Freq: Once | ORAL | Status: AC
  Administered 2021-05-23: 40 mg via ORAL
  Filled 2021-05-23: qty 2

## 2021-05-23 MED ORDER — LIDOCAINE 5 % EX PTCH
1.0000 | MEDICATED_PATCH | CUTANEOUS | Status: DC
  Administered 2021-05-23: 1 via TRANSDERMAL
  Filled 2021-05-23: qty 1

## 2021-05-23 NOTE — ED Provider Notes (Signed)
? ?[facility] ?Provider Note ? ? ? Event Date/Time  ? First MD Initiated Contact with Patient 05/23/21 1612   ?  (approximate) ? ? ?History  ? ?Chest Pain ? ? ?HPI ? ?Brent Glass is a 81 y.o. male   with a history of prostate cancer, diabetes, hypertension who comes the ED complaining of intermittent chest pain for the past few months.  Feels sharp, no aggravating or alleviating factors.  Last for variable amount of time.  Nonradiating.  No shortness of breath diaphoresis or vomiting.  No dizziness or palpitations. ? ?Today he had pain that started at about 3 PM.  It is been constant, again without any aggravating or alleviating factors.  He denies any exertional symptoms.  Not pleuritic.  No lower extremity edema or pain. ? ?  ? ? ?Physical Exam  ? ?Triage Vital Signs: ?ED Triage Vitals  ?Enc Vitals Group  ?   BP 05/23/21 1552 (!) 174/93  ?   Pulse Rate 05/23/21 1552 79  ?   Resp 05/23/21 1552 17  ?   Temp 05/23/21 1552 97.9 ?F (36.6 ?C)  ?   Temp src --   ?   SpO2 05/23/21 1552 97 %  ?   Weight --   ?   Height 05/23/21 1552 6' (1.829 m)  ?   Head Circumference --   ?   Peak Flow --   ?   Pain Score 05/23/21 1552 8  ?   Pain Loc --   ?   Pain Edu? --   ?   Excl. in [place]? --   ? ? ?Most recent vital signs: ?Vitals:  ? 05/23/21 1730 05/23/21 1800  ?BP: (!) 151/83 (!) 159/81  ?Pulse: 64 65  ?Resp:  17  ?Temp:    ?SpO2: 100% 100%  ? ? ? ?General: Awake, no distress.  ?CV:  Good peripheral perfusion.  Regular rate and rhythm ?Resp:  Normal effort.  Clear to auscultation bilaterally ?Abd:  No distention.  Soft and nontender ?Other:  There is chest wall tenderness which reproduces his pain at the right lower sternal margin. ? ? ?ED Results / Procedures / Treatments  ? ?Labs ?(all labs ordered are listed, but only abnormal results are displayed) ?Labs Reviewed  ?BASIC METABOLIC PANEL - Abnormal; Notable for the following components:  ?    Result Value  ? Glucose, Bld 165 (*)   ? All other components  within normal limits  ?CBC - Abnormal; Notable for the following components:  ? Hemoglobin 12.7 (*)   ? All other components within normal limits  ?TROPONIN I (HIGH SENSITIVITY)  ?TROPONIN I (HIGH SENSITIVITY)  ? ? ? ?EKG ? ?Interpreted by me ?Normal sinus rhythm rate of 77.  Left axis, normal intervals.  Normal QRS ST segments and T waves.  No ischemic changes. ? ? ?RADIOLOGY ?Chest x-ray viewed and interpreted by me, appears unremarkable.  Radiology report reviewed ? ? ? ?PROCEDURES: ? ?Critical Care performed: No ? ?Procedures ? ? ?MEDICATIONS ORDERED IN ED: ?Medications  ?lidocaine (LIDODERM) 5 % 1 patch (1 patch Transdermal Patch Applied 05/23/21 1703)  ?alum & mag hydroxide-simeth (MAALOX/MYLANTA) 200-200-20 MG/5ML suspension 30 mL (30 mLs Oral Given 05/23/21 1703)  ?famotidine (PEPCID) tablet 40 mg (40 mg Oral Given 05/23/21 1703)  ? ? ? ?IMPRESSION / MDM / ASSESSMENT AND PLAN / ED COURSE  ?I reviewed the triage vital signs and the nursing notes. ?             ?               ? ?  Differential diagnosis includes, but is not limited to, pneumonia, pneumothorax, non-STEMI, chest wall pain, GERD ? ?**The patient is on the cardiac monitor to evaluate for evidence of arrhythmia and/or significant heart rate changes.**} ? ?Patient presents with atypical chest pain.  Reproducible chest wall tenderness, most likely muscular spasm/strain.  Vital signs were unremarkable, EKG chest x-ray and initial labs are unremarkable. ? ?----------------------------------------- ?8:15 PM on 05/23/2021 ?----------------------------------------- ?Patient is calm and ambulatory, pain is resolved.  Repeat troponin is normal.  He does not require admission with reassuring work-up and atypical symptoms. ?  ? ? ?FINAL CLINICAL IMPRESSION(S) / ED DIAGNOSES  ? ?Final diagnoses:  ?Atypical chest pain  ? ? ? ?Rx / DC Orders  ? ?ED Discharge Orders   ? ? None  ? ?  ? ? ? ?Note:  This document was prepared using Dragon voice recognition software and  may include unintentional dictation errors. ?  ?Carrie Mew, MD ?05/23/21 2016 ? ?

## 2021-05-23 NOTE — ED Triage Notes (Signed)
Patient to ER via POV with complaints of intermittent chest pain that started approximately one hour ago. Describes pain as stabbing and non-radiating. Patient also endorses a headache. ? ?Denies cardiac history.  ?

## 2021-06-24 ENCOUNTER — Other Ambulatory Visit (HOSPITAL_COMMUNITY): Payer: Self-pay

## 2021-06-24 ENCOUNTER — Inpatient Hospital Stay (HOSPITAL_BASED_OUTPATIENT_CLINIC_OR_DEPARTMENT_OTHER): Payer: Medicare Other | Admitting: Internal Medicine

## 2021-06-24 ENCOUNTER — Inpatient Hospital Stay: Payer: Medicare Other | Attending: Internal Medicine

## 2021-06-24 ENCOUNTER — Encounter: Payer: Self-pay | Admitting: Internal Medicine

## 2021-06-24 VITALS — BP 157/87 | HR 63 | Temp 97.5°F | Resp 18 | Wt 204.4 lb

## 2021-06-24 DIAGNOSIS — F17210 Nicotine dependence, cigarettes, uncomplicated: Secondary | ICD-10-CM | POA: Insufficient documentation

## 2021-06-24 DIAGNOSIS — Z7982 Long term (current) use of aspirin: Secondary | ICD-10-CM | POA: Diagnosis not present

## 2021-06-24 DIAGNOSIS — R918 Other nonspecific abnormal finding of lung field: Secondary | ICD-10-CM

## 2021-06-24 DIAGNOSIS — C61 Malignant neoplasm of prostate: Secondary | ICD-10-CM

## 2021-06-24 DIAGNOSIS — C7800 Secondary malignant neoplasm of unspecified lung: Secondary | ICD-10-CM | POA: Diagnosis present

## 2021-06-24 DIAGNOSIS — C7951 Secondary malignant neoplasm of bone: Secondary | ICD-10-CM | POA: Insufficient documentation

## 2021-06-24 DIAGNOSIS — Z7952 Long term (current) use of systemic steroids: Secondary | ICD-10-CM | POA: Insufficient documentation

## 2021-06-24 DIAGNOSIS — Z7984 Long term (current) use of oral hypoglycemic drugs: Secondary | ICD-10-CM | POA: Diagnosis not present

## 2021-06-24 DIAGNOSIS — Z79899 Other long term (current) drug therapy: Secondary | ICD-10-CM | POA: Insufficient documentation

## 2021-06-24 LAB — CBC WITH DIFFERENTIAL/PLATELET
Abs Immature Granulocytes: 0.03 10*3/uL (ref 0.00–0.07)
Basophils Absolute: 0.1 10*3/uL (ref 0.0–0.1)
Basophils Relative: 1 %
Eosinophils Absolute: 0.3 10*3/uL (ref 0.0–0.5)
Eosinophils Relative: 3 %
HCT: 40.3 % (ref 39.0–52.0)
Hemoglobin: 12.6 g/dL — ABNORMAL LOW (ref 13.0–17.0)
Immature Granulocytes: 0 %
Lymphocytes Relative: 37 %
Lymphs Abs: 3.7 10*3/uL (ref 0.7–4.0)
MCH: 28 pg (ref 26.0–34.0)
MCHC: 31.3 g/dL (ref 30.0–36.0)
MCV: 89.6 fL (ref 80.0–100.0)
Monocytes Absolute: 0.8 10*3/uL (ref 0.1–1.0)
Monocytes Relative: 8 %
Neutro Abs: 5.1 10*3/uL (ref 1.7–7.7)
Neutrophils Relative %: 51 %
Platelets: 338 10*3/uL (ref 150–400)
RBC: 4.5 MIL/uL (ref 4.22–5.81)
RDW: 14.6 % (ref 11.5–15.5)
WBC: 9.9 10*3/uL (ref 4.0–10.5)
nRBC: 0 % (ref 0.0–0.2)

## 2021-06-24 LAB — COMPREHENSIVE METABOLIC PANEL
ALT: 10 U/L (ref 0–44)
AST: 14 U/L — ABNORMAL LOW (ref 15–41)
Albumin: 3.8 g/dL (ref 3.5–5.0)
Alkaline Phosphatase: 89 U/L (ref 38–126)
Anion gap: 7 (ref 5–15)
BUN: 10 mg/dL (ref 8–23)
CO2: 27 mmol/L (ref 22–32)
Calcium: 9.7 mg/dL (ref 8.9–10.3)
Chloride: 103 mmol/L (ref 98–111)
Creatinine, Ser: 1.05 mg/dL (ref 0.61–1.24)
GFR, Estimated: >60 mL/min (ref >60)
Glucose, Bld: 140 mg/dL — ABNORMAL HIGH (ref 70–99)
Potassium: 4.1 mmol/L (ref 3.5–5.1)
Sodium: 137 mmol/L (ref 135–145)
Total Bilirubin: 0.4 mg/dL (ref 0.3–1.2)
Total Protein: 7 g/dL (ref 6.5–8.1)

## 2021-06-24 LAB — PSA: Prostatic Specific Antigen: 0.43 ng/mL (ref 0.00–4.00)

## 2021-06-24 MED ORDER — ENZALUTAMIDE 40 MG PO TABS
120.0000 mg | ORAL_TABLET | Freq: Every day | ORAL | 3 refills | Status: DC
  Filled 2021-06-24: qty 90, 30d supply, fill #0
  Filled 2021-06-24: qty 120, 40d supply, fill #0
  Filled 2021-09-21: qty 90, 30d supply, fill #1
  Filled 2021-10-09: qty 90, 30d supply, fill #2
  Filled 2021-11-10: qty 90, 30d supply, fill #3
  Filled 2021-12-07: qty 90, 30d supply, fill #4

## 2021-06-24 NOTE — Progress Notes (Signed)
Patient denies new problems/concerns today. ? ?Has been out of Xtandi for 1 month. ?

## 2021-06-24 NOTE — Progress Notes (Signed)
[place] ?CONSULT NOTE ? ?Patient Care Team: ?McMurtrey, Estill Batten, MD as PCP - General (Family Medicine) ?Telford Nab, RN as Sales executive ? ?CHIEF COMPLAINTS/PURPOSE OF CONSULTATION: prostate cancer ? ? ? 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   # NOV 2021- CTA [ER] Diffuse bilateral pulmonary nodules including a cavitary lesion in the right upper lobe measuring approximately 2 cm. There is extensive nodular pleural thickening bilaterally, greatest in the left lower lobe.  November 2021 PET scan-multiple lung lesions multiple bone lesions and prostate uptake.  February 2022-prostate biopsy [Dr.Brandon] ? ? ?# DM-2; NO COPD [?]; ACTIVE SMOKER 65ppd. ? ?# DEC 2nd week- FIRMAGON; # JAN 12TH, 2022-ZYTIGA  1000 MG+ PRED 5MG; FEB MID 2022- ELIGARD q6M; MARCH 2022- DISCONTINUE ZYTIGA sec to PRESS [severe hypokalemia/mental status changes hospitalization]; ? ?#  ? ?# MAY 6th, 2022- XTnadi 3pills/day  ? ?# NGS/MOLECULAR TESTS: MARCH 2022-foundation 1 no targetable mutation./Genetic evaluation negative ? ? ? ?# PALLIATIVE CARE EVALUATION: ? ?# PAIN MANAGEMENT:  ? ? ?DIAGNOSIS: Prostate cancer ? ?STAGE:   IV      ;  GOALS: Palliative ? ?CURRENT/MOST RECENT THERAPY : ADT+ [name] ? ?  ?Prostate cancer metastatic to multiple sites [facility])  ?02/04/2020 Initial Diagnosis  ? Prostate cancer metastatic to multiple sites [facility]) ?  ?03/19/2020 Cancer Staging  ? Staging form: Prostate, AJCC 8th Edition ?- Clinical: Stage IVB (pM1b) - Signed by Cammie Sickle, MD on 03/19/2020 ? ?  ? Genetic Testing  ? Negative genetic testing. No pathogenic variants identified on the [facility] Multi-Cancer Panel. VUS in AXIN2 called c.1235A>C identified. The report date is 03/23/2020.  ? ?The Multi-Cancer Panel offered by [facility] includes sequencing and/or deletion duplication testing of the following 84 genes: AIP, ALK, APC, ATM, AXIN2,BAP1,  BARD1,  BLM, BMPR1A, BRCA1, BRCA2, BRIP1, CASR, CDC73, CDH1, CDK4, CDKN1B, CDKN1C, CDKN2A (p14ARF), CDKN2A (p16INK4a), CEBPA, CHEK2, CTNNA1, DICER1, DIS3L2, EGFR (c.2369C>T, p.Thr790Met variant only), EPCAM (Deletion/duplication testing only), FH, FLCN, GATA2, GPC3, GREM1 (Promoter region deletion/duplication testing only), HOXB13 (c.251G>A, p.Gly84Glu), HRAS, KIT, MAX, MEN1, MET, MITF (c.952G>A, p.Glu318Lys variant only), MLH1, MSH2, MSH3, MSH6, MUTYH, NBN, NF1, NF2, NTHL1, PALB2, PDGFRA, PHOX2B, PMS2, POLD1, POLE, POT1, PRKAR1A, PTCH1, PTEN, RAD50, RAD51C, RAD51D, RB1, RECQL4, RET, RUNX1, SDHAF2, SDHA (sequence changes only), SDHB, SDHC, SDHD, SMAD4, SMARCA4, SMARCB1, SMARCE1, STK11, SUFU, TERC, TERT, TMEM127, TP53, TSC1, TSC2, VHL, WRN and WT1.  ?  ? ? ? ?HISTORY OF PRESENTING ILLNESS: Ambulating with a cane.  Alone. ? ?Brent Glass 81 y.o.  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 ? ?Patient denies any worsening bone pain or joint pains.  States that he has recently ran out of his prescription for Xtandi. ? ? No fever no chills.  No constipation no diarrhea.  No nausea no vomiting. ? ?Review of Systems  ?Constitutional:  Positive for malaise/fatigue. Negative for chills, diaphoresis and fever.  ?HENT:  Negative for nosebleeds and sore throat.   ?Eyes:  Negative for double vision.  ?Respiratory:  Negative for hemoptysis, sputum production, shortness of breath and wheezing.   ?Cardiovascular:  Negative for chest pain, palpitations, orthopnea and leg swelling.  ?Gastrointestinal:  Positive for abdominal pain. Negative for blood in stool, constipation, diarrhea, heartburn, melena, nausea and vomiting.  ?Genitourinary:  Negative for dysuria, frequency and urgency.  ?Musculoskeletal:  Positive for back pain and myalgias. Negative for joint pain.  ?  Skin: Negative.  Negative for itching and rash.  ?Neurological:  Negative for dizziness, tingling, focal  weakness, weakness and headaches.  ?Endo/Heme/Allergies:  Does not bruise/bleed easily.  ?Psychiatric/Behavioral:  Negative for depression. The patient is not nervous/anxious and does not have insomnia.    ? ?MEDICAL HISTORY:  ?Past Medical History:  ?Diagnosis Date  ? Arthritis   ? BPH (benign prostatic hyperplasia)   ? BPH with obstruction/lower urinary tract symptoms   ? Diabetes ([place])   ? Disorder resulting from impaired renal function   ? Elevated PSA   ? Family history of lung cancer   ? Family history of lymphoma   ? Heart murmur   ? Hypertension   ? Knee pain   ? Lesion of lung   ? Low HDL (under 40)   ? Obesity   ? Urinary hesitancy   ? ? ?SURGICAL HISTORY: ?Past Surgical History:  ?Procedure Laterality Date  ? HERNIA REPAIR  2012  ? Umbilical Hernia Repair  ? KNEE ARTHROSCOPY Left 04/10/2015  ? Procedure: ARTHROSCOPY KNEE, partial medial meniscectomy, partial synovectomy;  Surgeon: Hessie Knows, MD;  Location: [facility] ORS;  Service: Orthopedics;  Laterality: Left;  ? ? ?SOCIAL HISTORY: ?Social History  ? ?Socioeconomic History  ? Marital status: Widowed  ?  Spouse name: Not on file  ? Number of children: Not on file  ? Years of education: Not on file  ? Highest education level: Not on file  ?Occupational History  ? Not on file  ?Tobacco Use  ? Smoking status: Every Day  ?  Packs/day: 1.00  ?  Types: Cigarettes  ? Smokeless tobacco: Never  ?Vaping Use  ? Vaping Use: Never used  ?Substance and Sexual Activity  ? Alcohol use: No  ?  Alcohol/week: 0.0 standard drinks  ? Drug use: No  ? Sexual activity: Not Currently  ?Other Topics Concern  ? Not on file  ?Social History Narrative  ? Lives in [place]; self; daughter lives 1 mile. Smoke 1ppd > 65 years; stopped 25 years ago.  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   ? ?Social Determinants of Health  ? ?Financial Resource Strain: Not on file  ?Food Insecurity: Not on file  ?Transportation Needs: Not on file  ?Physical Activity: Not on file   ?Stress: Not on file  ?Social Connections: Not on file  ?Intimate Partner Violence: Not on file  ? ? ?FAMILY HISTORY: ?Family History  ?Problem Relation Age of Onset  ? Diabetes Mother   ? Lung cancer Mother   ? Lymphoma Son 62  ?     died in 55.   ? Kidney disease Neg Hx   ? Prostate cancer Neg Hx   ? ? ?ALLERGIES:  has No Known Allergies. ? ?MEDICATIONS:  ?Current Outpatient Medications  ?Medication Sig Dispense Refill  ? acetaminophen (TYLENOL) 650 MG CR tablet Take 650 mg by mouth every 8 (eight) hours as needed for pain.    ? albuterol (PROVENTIL HFA;VENTOLIN HFA) 108 (90 BASE) MCG/ACT inhaler Inhale 2 puffs into the lungs every 6 (six) hours as needed for wheezing or shortness of breath.    ? aspirin 81 MG tablet Take 81 mg by mouth daily.    ? cyanocobalamin 2000 MCG tablet Take 1 tablet (2,000 mcg total) by mouth daily. 90 tablet 1  ? finasteride (PROSCAR) 5 MG tablet TAKE 1 TABLET BY MOUTH EVERY DAY (Patient taking differently: Take 5 mg by mouth daily.) 90 tablet 3  ? GLUMETZA 1000  MG 24 hr tablet Take 1,000 mg by mouth 2 (two) times daily.    ? metFORMIN (GLUCOPHAGE) 1000 MG tablet Take 1,000 mg by mouth 2 (two) times daily with a meal.    ? ciprofloxacin (CIPRO) 500 MG tablet Take 1 tablet (500 mg total) by mouth 2 (two) times daily. (Patient not taking: Reported on 06/24/2021) 14 tablet 0  ? enzalutamide (XTANDI) 40 MG tablet Take 3 tablets (120 mg total) by mouth daily. 120 tablet 3  ? metroNIDAZOLE (FLAGYL) 500 MG tablet Take 1 tablet (500 mg total) by mouth 3 (three) times daily. (Patient not taking: Reported on 06/24/2021) 21 tablet 0  ? potassium chloride SA (KLOR-CON M20) 20 MEQ tablet 1 PILL TWICE A DAY (Patient not taking: Reported on 11/11/2020) 40 tablet 0  ? predniSONE (DELTASONE) 5 MG tablet Take 5 mg by mouth every morning. (Patient not taking: Reported on 11/11/2020)    ? rosuvastatin (CRESTOR) 20 MG tablet Take 20 mg by mouth every morning.    ? simvastatin (ZOCOR) 40 MG tablet Take 40 mg by  mouth daily.    ? thiamine 100 MG tablet TAKE 1 TABLET BY MOUTH EVERY DAY 90 tablet 1  ? TRADJENTA 5 MG TABS tablet Take 5 mg by mouth daily. (Patient not taking: Reported on 11/11/2020)    ? ?No current facilit

## 2021-06-24 NOTE — Assessment & Plan Note (Addendum)
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  ;On Eligard;   PSA significantly improved-STABLE; PET AUG 30th, 2022-  STABLE.  ? ?# continue X-tandi 120 mg/day-  Continue vitamin D.  STABLE.  Refilled Xtandi.  Discussed with Bethena Roys. ? 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 Order CT chest today/ in 1 months ? ?# Diverticulitis versus other causes. S/P- ciprofloxacin plus Flagyl. S/p Declines GI evaluation with Colonoscopy.  ? ?#Chest tenderness-grade 1-2. Sec to ADT- continue tramadol prn; add vaseline BID-STABLE.  ? ?# DISPOSITION:  ?# Follow up in  1 month - MD-;labs- cbc/cmp/PSA; CT chest prior; Eligard-- - Dr.B ? ? ? ? ?

## 2021-07-13 ENCOUNTER — Other Ambulatory Visit (HOSPITAL_COMMUNITY): Payer: Self-pay

## 2021-07-15 ENCOUNTER — Other Ambulatory Visit (HOSPITAL_COMMUNITY): Payer: Self-pay

## 2021-07-17 ENCOUNTER — Ambulatory Visit
Admission: RE | Admit: 2021-07-17 | Discharge: 2021-07-17 | Disposition: A | Discharge status: Home / Self Care | Payer: Medicare Other | Source: Ambulatory Visit | Attending: Internal Medicine | Admitting: Internal Medicine

## 2021-07-17 DIAGNOSIS — C61 Malignant neoplasm of prostate: Secondary | ICD-10-CM

## 2021-07-17 DIAGNOSIS — R918 Other nonspecific abnormal finding of lung field: Secondary | ICD-10-CM

## 2021-07-17 MED ORDER — IOHEXOL 300 MG/ML  SOLN
75.0000 mL | Freq: Once | INTRAMUSCULAR | Status: AC | PRN
  Administered 2021-07-17: 75 mL via INTRAVENOUS

## 2021-07-20 ENCOUNTER — Other Ambulatory Visit (HOSPITAL_COMMUNITY): Payer: Self-pay

## 2021-07-24 ENCOUNTER — Encounter: Payer: Self-pay | Admitting: Internal Medicine

## 2021-07-24 ENCOUNTER — Inpatient Hospital Stay: Payer: Medicare Other | Attending: Internal Medicine

## 2021-07-24 ENCOUNTER — Inpatient Hospital Stay (HOSPITAL_BASED_OUTPATIENT_CLINIC_OR_DEPARTMENT_OTHER): Payer: Medicare Other | Admitting: Internal Medicine

## 2021-07-24 ENCOUNTER — Inpatient Hospital Stay: Payer: Medicare Other

## 2021-07-24 VITALS — BP 115/91 | HR 72 | Temp 98.4°F | Ht 72.0 in | Wt 202.2 lb

## 2021-07-24 DIAGNOSIS — Z79899 Other long term (current) drug therapy: Secondary | ICD-10-CM | POA: Insufficient documentation

## 2021-07-24 DIAGNOSIS — Z7984 Long term (current) use of oral hypoglycemic drugs: Secondary | ICD-10-CM | POA: Diagnosis not present

## 2021-07-24 DIAGNOSIS — F17210 Nicotine dependence, cigarettes, uncomplicated: Secondary | ICD-10-CM | POA: Insufficient documentation

## 2021-07-24 DIAGNOSIS — Z801 Family history of malignant neoplasm of trachea, bronchus and lung: Secondary | ICD-10-CM | POA: Diagnosis not present

## 2021-07-24 DIAGNOSIS — Z807 Family history of other malignant neoplasms of lymphoid, hematopoietic and related tissues: Secondary | ICD-10-CM | POA: Insufficient documentation

## 2021-07-24 DIAGNOSIS — E119 Type 2 diabetes mellitus without complications: Secondary | ICD-10-CM | POA: Insufficient documentation

## 2021-07-24 DIAGNOSIS — C7800 Secondary malignant neoplasm of unspecified lung: Secondary | ICD-10-CM | POA: Diagnosis present

## 2021-07-24 DIAGNOSIS — Z7982 Long term (current) use of aspirin: Secondary | ICD-10-CM | POA: Insufficient documentation

## 2021-07-24 DIAGNOSIS — C61 Malignant neoplasm of prostate: Secondary | ICD-10-CM | POA: Insufficient documentation

## 2021-07-24 DIAGNOSIS — C7951 Secondary malignant neoplasm of bone: Secondary | ICD-10-CM | POA: Insufficient documentation

## 2021-07-24 DIAGNOSIS — R918 Other nonspecific abnormal finding of lung field: Secondary | ICD-10-CM

## 2021-07-24 LAB — CBC WITH DIFFERENTIAL/PLATELET
Abs Immature Granulocytes: 0.01 10*3/uL (ref 0.00–0.07)
Basophils Absolute: 0.1 10*3/uL (ref 0.0–0.1)
Basophils Relative: 1 %
Eosinophils Absolute: 0.2 10*3/uL (ref 0.0–0.5)
Eosinophils Relative: 2 %
HCT: 41.4 % (ref 39.0–52.0)
Hemoglobin: 13.4 g/dL (ref 13.0–17.0)
Immature Granulocytes: 0 %
Lymphocytes Relative: 38 %
Lymphs Abs: 3.7 10*3/uL (ref 0.7–4.0)
MCH: 28.2 pg (ref 26.0–34.0)
MCHC: 32.4 g/dL (ref 30.0–36.0)
MCV: 87.2 fL (ref 80.0–100.0)
Monocytes Absolute: 0.8 10*3/uL (ref 0.1–1.0)
Monocytes Relative: 8 %
Neutro Abs: 5.1 10*3/uL (ref 1.7–7.7)
Neutrophils Relative %: 51 %
Platelets: 357 10*3/uL (ref 150–400)
RBC: 4.75 MIL/uL (ref 4.22–5.81)
RDW: 14.3 % (ref 11.5–15.5)
WBC: 9.8 10*3/uL (ref 4.0–10.5)
nRBC: 0 % (ref 0.0–0.2)

## 2021-07-24 LAB — COMPREHENSIVE METABOLIC PANEL
ALT: 7 U/L (ref 0–44)
AST: 12 U/L — ABNORMAL LOW (ref 15–41)
Albumin: 4.1 g/dL (ref 3.5–5.0)
Alkaline Phosphatase: 103 U/L (ref 38–126)
Anion gap: 8 (ref 5–15)
BUN: 19 mg/dL (ref 8–23)
CO2: 25 mmol/L (ref 22–32)
Calcium: 9.6 mg/dL (ref 8.9–10.3)
Chloride: 103 mmol/L (ref 98–111)
Creatinine, Ser: 1.15 mg/dL (ref 0.61–1.24)
GFR, Estimated: >60 mL/min (ref >60)
Glucose, Bld: 109 mg/dL — ABNORMAL HIGH (ref 70–99)
Potassium: 3.9 mmol/L (ref 3.5–5.1)
Sodium: 136 mmol/L (ref 135–145)
Total Bilirubin: 0.4 mg/dL (ref 0.3–1.2)
Total Protein: 7.7 g/dL (ref 6.5–8.1)

## 2021-07-24 LAB — PSA: Prostatic Specific Antigen: 0.32 ng/mL (ref 0.00–4.00)

## 2021-07-24 MED ORDER — LEUPROLIDE ACETATE (6 MONTH) 45 MG ~~LOC~~ KIT
45.0000 mg | PACK | Freq: Once | SUBCUTANEOUS | Status: AC
  Administered 2021-07-24: 45 mg via SUBCUTANEOUS
  Filled 2021-07-24: qty 45

## 2021-07-24 NOTE — Assessment & Plan Note (Addendum)
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  Reviewed the PSA slightly rising April 2023 0.4.  But clinically asymptomatic.  Current therapy.  # continue X-tandi 120 mg/day-  Continue vitamin D.  STABLE.  Proceed with Eligard today.  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  Will review at tumor conference  # Chest tenderness-grade 1-2. Sec to ADT- continue tramadol prn; add vaseline BID-STABLE.   * eliagrd 45 in nov 2023 again  # DISPOSITION:  # Eligard today # Follow up in  3 month - MD-;labs- cbc/cmp/PSA;- - Dr.B

## 2021-07-24 NOTE — Progress Notes (Signed)
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   # NOV 2021- CTA [ER] Diffuse bilateral pulmonary nodules including a cavitary lesion in the right upper lobe measuring approximately 2 cm. There is extensive nodular pleural thickening bilaterally, greatest in the left lower lobe.  November 2021 PET scan-multiple lung lesions multiple bone lesions and prostate uptake.  February 2022-prostate biopsy [Dr.Brandon]   # DM-2; NO COPD [?]; ACTIVE SMOKER 65ppd.  # DEC 2nd week- FIRMAGON; # JAN 12TH, 2022-ZYTIGA  1000 MG+ PRED 5MG; FEB MID 2022- ELIGARD q6M; MARCH 2022- DISCONTINUE ZYTIGA sec to PRESS [severe hypokalemia/mental status changes hospitalization];  #   # MAY 6th, 2022- XTnadi 3pills/day   # NGS/MOLECULAR TESTS: MARCH 2022-foundation 1 no targetable mutation./Genetic evaluation negative    # PALLIATIVE CARE EVALUATION:  # PAIN MANAGEMENT:    DIAGNOSIS: Prostate cancer  STAGE:   IV      ;  GOALS: Palliative  CURRENT/MOST RECENT THERAPY : ADT+ Xtandi    Prostate cancer metastatic to multiple sites [facility])  02/04/2020 Initial Diagnosis   Prostate cancer metastatic to multiple sites [facility])   03/19/2020 Cancer Staging   Staging form: Prostate, AJCC 8th Edition - Clinical: Stage IVB (pM1b) - Signed by Cammie Sickle, MD on 03/19/2020     Genetic Testing   Negative genetic testing. No pathogenic variants identified on the [facility] Multi-Cancer Panel. VUS in AXIN2 called c.1235A>C identified. The report date is 03/23/2020.   The Multi-Cancer Panel offered by [facility] includes sequencing and/or deletion duplication testing of  the following 84 genes: AIP, ALK, APC, ATM, AXIN2,BAP1,  BARD1, BLM, BMPR1A, BRCA1, BRCA2, BRIP1, CASR, CDC73, CDH1, CDK4, CDKN1B, CDKN1C, CDKN2A (p14ARF), CDKN2A (p16INK4a), CEBPA, CHEK2, CTNNA1, DICER1, DIS3L2, EGFR (c.2369C>T, p.Thr790Met variant only), EPCAM (Deletion/duplication testing only), FH, FLCN, GATA2, GPC3, GREM1 (Promoter region deletion/duplication testing only), HOXB13 (c.251G>A, p.Gly84Glu), HRAS, KIT, MAX, MEN1, MET, MITF (c.952G>A, p.Glu318Lys variant only), MLH1, MSH2, MSH3, MSH6, MUTYH, NBN, NF1, NF2, NTHL1, PALB2, PDGFRA, PHOX2B, PMS2, POLD1, POLE, POT1, PRKAR1A, PTCH1, PTEN, RAD50, RAD51C, RAD51D, RB1, RECQL4, RET, RUNX1, SDHAF2, SDHA (sequence changes only), SDHB, SDHC, SDHD, SMAD4, SMARCA4, SMARCB1, SMARCE1, STK11, SUFU, TERC, TERT, TMEM127, TP53, TSC1, TSC2, VHL, WRN and WT1.       HISTORY OF PRESENTING ILLNESS: Ambulating with a cane.  Accompanied by his daughter  Brent Glass 81 y.o.  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  He states that he is compliant with his oral Xtandi.   No fever no chills.  No constipation no diarrhea.  No nausea no vomiting.  Review of Systems  Constitutional:  Positive for malaise/fatigue. Negative for chills, diaphoresis and fever.  HENT:  Negative for nosebleeds and sore throat.   Eyes:  Negative for double vision.  Respiratory:  Negative for hemoptysis, sputum production, shortness of breath and wheezing.   Cardiovascular:  Negative for chest pain, palpitations, orthopnea and leg swelling.  Gastrointestinal:  Positive for abdominal pain. Negative for blood in stool, constipation, diarrhea, heartburn, melena, nausea and vomiting.  Genitourinary:  Negative for dysuria, frequency and  urgency.  Musculoskeletal:  Positive for back pain and myalgias. Negative for joint pain.  Skin:  Negative.  Negative for itching and rash.  Neurological:  Negative for dizziness, tingling, focal weakness, weakness and headaches.  Endo/Heme/Allergies:  Does not bruise/bleed easily.  Psychiatric/Behavioral:  Negative for depression. The patient is not nervous/anxious and does not have insomnia.     MEDICAL HISTORY:  Past Medical History:  Diagnosis Date   Arthritis    BPH (benign prostatic hyperplasia)    BPH with obstruction/lower urinary tract symptoms    Diabetes ([facility])    Disorder resulting from impaired renal function    Elevated PSA    Family history of lung cancer    Family history of lymphoma    Heart murmur    Hypertension    Knee pain    Lesion of lung    Low HDL (under 40)    Obesity    Urinary hesitancy     SURGICAL HISTORY: Past Surgical History:  Procedure Laterality Date   HERNIA REPAIR  0454   Umbilical Hernia Repair   KNEE ARTHROSCOPY Left 04/10/2015   Procedure: ARTHROSCOPY KNEE, partial medial meniscectomy, partial synovectomy;  Surgeon: Hessie Knows, MD;  Location: [facility] ORS;  Service: Orthopedics;  Laterality: Left;    SOCIAL HISTORY: Social History   Socioeconomic History   Marital status: Widowed    Spouse name: Not on file   Number of children: Not on file   Years of education: Not on file   Highest education level: Not on file  Occupational History   Not on file  Tobacco Use   Smoking status: Every Day    Packs/day: 1.00    Types: Cigarettes   Smokeless tobacco: Never  Vaping Use   Vaping Use: Never used  Substance and Sexual Activity   Alcohol use: No    Alcohol/week: 0.0 standard drinks   Drug use: No   Sexual activity: Not Currently  Other Topics Concern   Not on file  Social History Narrative   Lives in [place]; self; daughter lives 1 mile. Smoke 1ppd > 64 years; stopped 25 years ago.  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    Social Determinants of Health   Financial Resource Strain: Not on file   Food Insecurity: Not on file  Transportation Needs: Not on file  Physical Activity: Not on file  Stress: Not on file  Social Connections: Not on file  Intimate Partner Violence: Not on file    FAMILY HISTORY: Family History  Problem Relation Age of Onset   Diabetes Mother    Lung cancer Mother    Lymphoma Son 28       died in 14.    Kidney disease Neg Hx    Prostate cancer Neg Hx     ALLERGIES:  has No Known Allergies.  MEDICATIONS:  Current Outpatient Medications  Medication Sig Dispense Refill   acetaminophen (TYLENOL) 650 MG CR tablet Take 650 mg by mouth every 8 (eight) hours as needed for pain.     albuterol (PROVENTIL HFA;VENTOLIN HFA) 108 (90 BASE) MCG/ACT inhaler Inhale 2 puffs into the lungs every 6 (six) hours as needed for wheezing or shortness of breath.     aspirin 81 MG tablet Take 81 mg by mouth daily.     cyanocobalamin 2000 MCG tablet Take 1 tablet (2,000 mcg total) by mouth daily. 90 tablet 1   enzalutamide (XTANDI) 40 MG tablet Take 3 tablets (120 mg total) by mouth  daily. 120 tablet 3   finasteride (PROSCAR) 5 MG tablet TAKE 1 TABLET BY MOUTH EVERY DAY (Patient taking differently: Take 5 mg by mouth daily.) 90 tablet 3   GLUMETZA 1000 MG 24 hr tablet Take 1,000 mg by mouth 2 (two) times daily.     metFORMIN (GLUCOPHAGE) 1000 MG tablet Take 1,000 mg by mouth 2 (two) times daily with a meal.     rosuvastatin (CRESTOR) 20 MG tablet Take 20 mg by mouth every morning.     simvastatin (ZOCOR) 40 MG tablet Take 40 mg by mouth daily.     thiamine 100 MG tablet TAKE 1 TABLET BY MOUTH EVERY DAY 90 tablet 1   No current facility-administered medications for this visit.      Marland Kitchen  PHYSICAL EXAMINATION: ECOG PERFORMANCE STATUS: 0 - Asymptomatic  Vitals:   07/24/21 1343  BP: (!) 115/91  Pulse: 72  Temp: 98.4 F (36.9 C)  SpO2: 100%     Filed Weights   07/24/21 1343  Weight: 202 lb 3.2 oz (91.7 kg)     Physical Exam HENT:     Head: Normocephalic  and atraumatic.     Mouth/Throat:     Pharynx: No oropharyngeal exudate.  Eyes:     Pupils: Pupils are equal, round, and reactive to light.  Cardiovascular:     Rate and Rhythm: Normal rate and regular rhythm.  Pulmonary:     Effort: No respiratory distress.     Breath sounds: No wheezing.     Comments: Decreased breath sounds bilaterally. Abdominal:     General: Bowel sounds are normal. There is no distension.     Palpations: Abdomen is soft. There is no mass.     Tenderness: There is no abdominal tenderness. There is no guarding or rebound.  Musculoskeletal:        General: No tenderness. Normal range of motion.     Cervical back: Normal range of motion and neck supple.  Skin:    General: Skin is warm.  Neurological:     Mental Status: He is alert and oriented to person, place, and time.  Psychiatric:        Mood and Affect: Affect normal.     LABORATORY DATA:  I have reviewed the data as listed Lab Results  Component Value Date   WBC 9.8 07/24/2021   HGB 13.4 07/24/2021   HCT 41.4 07/24/2021   MCV 87.2 07/24/2021   PLT 357 07/24/2021   Recent Labs    03/13/21 1620 05/23/21 1557 06/24/21 0932 07/24/21 1335  NA 135 138 137 136  K 4.0 4.0 4.1 3.9  CL 105 105 103 103  CO2 _0 GLUCOSE 110* 165* 140* 109*  BUN _1 CREATININE 0.83 1.05 1.05 1.15  CALCIUM 9.9 9.7 9.7 9.6  GFRNONAA >60 >60 >60 >60  PROT 7.6  --  7.0 7.7  ALBUMIN 3.8  --  3.8 4.1  AST 9*  --  14* 12*  ALT 7  --  10 7  ALKPHOS 101  --  89 103  BILITOT 0.7  --  0.4 0.4    Latest Reference Range & Units 01/23/20 12:22 03/10/20 14:11 03/19/20 09:19 04/16/20 09:10 05/14/20 10:06 06/13/20 09:26 07/11/20 10:13 08/07/20 09:24 09/04/20 10:13 09/16/20 13:59 10/21/20 13:59 12/23/20 13:27  Prostatic Specific Antigen 0.00 - 4.00 ng/mL 986.00 (H) 34.69 (H) 21.59 (H) 2.68 1.21 0.71 0.68 0.41 0.36 0.28 0.29 0.28  (H): Data is abnormally  high  RADIOGRAPHIC STUDIES: I have personally reviewed  the radiological images as listed and agreed with the findings in the report. CT Chest W Contrast  Result Date: 07/20/2021 CLINICAL DATA:  History of prostate cancer with multiple pulmonary nodules on prior imaging found to have pleural and pulmonary disease on imaging as far back as November of 2021. * Tracking Code: BO * EXAM: CT CHEST WITH CONTRAST TECHNIQUE: Multidetector CT imaging of the chest was performed during intravenous contrast administration. RADIATION DOSE REDUCTION: This exam was performed according to the departmental dose-optimization program which includes automated exposure control, adjustment of the mA and/or kV according to patient size and/or use of iterative reconstruction technique. CONTRAST:  22m OMNIPAQUE IOHEXOL 300 MG/ML  SOLN COMPARISON:  January 17, 2020 and PET exam from November 04, 2020. FINDINGS: Cardiovascular: Calcified aortic atherosclerosis. Three-vessel coronary artery disease. No pericardial effusion or thickening. Central pulmonary vasculature is unremarkable. Mediastinum/Nodes: Thoracic inlet structures are normal. No signs of adenopathy in the chest.  Esophagus grossly normal. Lungs/Pleura: No signs of recurrence of diffuse pleural and parenchymal nodules that were seen in 2021. Subtle background centrilobular indistinct nodules with upper lobe predominance though seen throughout the chest. Cystic lesion in the RIGHT upper lobe posteriorly is persistent across a series of prior examinations and in total measures approximately 2.6 x 2.9 cm greatest axial dimension previously as much as 2.1 x 1.5 cm. Thickening of the wall is irregular and approximately 6 mm greatest thickness which is similar to remote imaging. Subtle ground-glass nodule in the medial RIGHT lower lobe (image 80/3) 6 mm in addition to the scattered central lobular nodules seen elsewhere in the chest. Airways are patent. Upper Abdomen: No acute findings relative to visualized liver, pancreas, spleen,  adrenal glands or kidneys with stable adrenal thickening that showed low-density and is greatest on the LEFT compatible with benign hyperplasia or small LEFT adrenal adenoma. No specific follow-up recommended for this finding. Partially imaged duodenal diverticulum and signs of colonic diverticulosis. Musculoskeletal: Sclerotic lesions seen on previous imaging are less conspicuous but with similar distribution involving all visualized bones. There is no acute process related to osseous structures. IMPRESSION: 1. Cystic area with irregular wall in the RIGHT lung remains concerning for underlying bronchogenic neoplasm. 2. Indistinct centrilobular nodules raising the question of respiratory bronchiolitis. No solid nodules with the single ground-glass nodule in the RIGHT lower lobe as well. Could consider continued follow-up at 3-6 months or repeat PET evaluation as warranted for the dominant nodule discussed above. 3. Signs of bony metastatic disease less conspicuous than on previous imaging but with similar distribution when compared to the most recent cross-sectional imaging study which is a PET scan from 2022. Aortic Atherosclerosis (ICD10-I70.0). Electronically Signed   By: GZetta BillsM.D.   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  Reviewed the PSA slightly rising April 2023 0.4.  But clinically asymptomatic.  Current therapy.  # continue X-tandi 120 mg/day-  Continue vitamin D.  STABLE.  Proceed with Eligard today.  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  Will review at tumor conference    # Chest tenderness-grade 1-2. Sec to ADT- continue tramadol prn; add vaseline  BID-STABLE.   *  eliagrd 45 in nov 2023 again  # DISPOSITION:  # Eligard today # Follow up in  3 month - MD-;labs- cbc/cmp/PSA;- - Dr.B     All questions were answered. The patient knows to call the clinic with any problems, questions or concerns.    Cammie Sickle, MD 07/24/2021 4:16 PM

## 2021-07-30 ENCOUNTER — Other Ambulatory Visit: Payer: Medicare Other

## 2021-07-30 NOTE — Progress Notes (Signed)
Tumor Board Documentation  Brent Glass was presented by Dr Rogue Bussing at our Tumor Board on 07/30/2021, which included representatives from medical oncology, surgical, pharmacy, pulmonology, radiology, pathology, radiation oncology, navigation, research, internal medicine, palliative care.  Brent Glass currently presents as a current patient, for discussion with history of the following treatments:  (ADT, Zytiga).  Additionally, we reviewed previous medical and familial history, history of present illness, and recent lab results along with all available histopathologic and imaging studies. The tumor board considered available treatment options and made the following recommendations: Active surveillance (Short term follow up scan) Can do RoboticBroch, but patient is at high risk for Pneumothorax  The following procedures/referrals were also placed: No orders of the defined types were placed in this encounter.   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  Tumor board is a meeting of clinicians from various specialty areas who evaluate and discuss patients for whom a multidisciplinary approach is being considered. Final determinations in the plan of care are those of the provider(s). The responsibility for follow up of recommendations given during tumor board is that of the provider.   Today's extended care, comprehensive team conference, Brent Glass was not present for the discussion and was not examined.   Multidisciplinary Tumor Board is a multidisciplinary case peer review process.  Decisions discussed in the Multidisciplinary Tumor Board reflect the opinions of the specialists present at the conference without having examined the patient.  Ultimately, treatment and diagnostic decisions rest with the primary  provider(s) and the patient.

## 2021-08-13 ENCOUNTER — Other Ambulatory Visit (HOSPITAL_COMMUNITY): Payer: Self-pay

## 2021-09-01 ENCOUNTER — Other Ambulatory Visit (HOSPITAL_COMMUNITY): Payer: Self-pay

## 2021-09-07 ENCOUNTER — Other Ambulatory Visit (HOSPITAL_COMMUNITY): Payer: Self-pay

## 2021-09-17 ENCOUNTER — Other Ambulatory Visit (HOSPITAL_COMMUNITY): Payer: Self-pay

## 2021-09-21 ENCOUNTER — Other Ambulatory Visit (HOSPITAL_COMMUNITY): Payer: Self-pay

## 2021-09-21 ENCOUNTER — Other Ambulatory Visit: Payer: Self-pay

## 2021-09-21 ENCOUNTER — Telehealth: Payer: Self-pay | Admitting: *Deleted

## 2021-09-21 DIAGNOSIS — C61 Malignant neoplasm of prostate: Secondary | ICD-10-CM

## 2021-09-21 NOTE — Telephone Encounter (Signed)
Confirmed that refill not needed at this time.  [facility] has been trying to contact pt for deliver.  [facility] contact number has been provided to patient's daughter.

## 2021-09-21 NOTE — Telephone Encounter (Signed)
Pt's duaghter left message that pt has run out of his "cancer medication" and requests a call back.

## 2021-09-21 NOTE — Telephone Encounter (Signed)
Refill request for [facility] sent to MD.

## 2021-09-21 NOTE — Telephone Encounter (Signed)
07/24/21 office note:  continue X-tandi 120 mg/day-  Continue vitamin D.  STABLE.  Proceed with Eligard today.  Follow up in  3 month - MD-;labs- cbc/cmp/PSA  next appt: 10/26/21

## 2021-09-21 NOTE — Telephone Encounter (Signed)
Thank you Brent Glass.

## 2021-09-21 NOTE — Telephone Encounter (Signed)
Spoke with patient's daughter Margarito Courser, she will call [facility] at [phone] to set-up delivery of his medication. She plans on asking for her phone number to be the primary number for refill calls moving forward.

## 2021-10-09 ENCOUNTER — Other Ambulatory Visit (HOSPITAL_COMMUNITY): Payer: Self-pay

## 2021-10-13 ENCOUNTER — Other Ambulatory Visit (HOSPITAL_COMMUNITY): Payer: Self-pay

## 2021-10-14 ENCOUNTER — Other Ambulatory Visit (HOSPITAL_COMMUNITY): Payer: Self-pay

## 2021-10-25 NOTE — Progress Notes (Unsigned)
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   # NOV 2021- CTA [ER] Diffuse bilateral pulmonary nodules including a cavitary lesion in the right upper lobe measuring approximately 2 cm. There is extensive nodular pleural thickening bilaterally, greatest in the left lower lobe.  November 2021 PET scan-multiple lung lesions multiple bone lesions and prostate uptake.  February 2022-prostate biopsy [Dr.Brandon]   # DM-2; NO COPD [?]; ACTIVE SMOKER 65ppd.  # DEC 2nd week- FIRMAGON; # JAN 12TH, 2022-ZYTIGA  1000 MG+ PRED 5MG; FEB MID 2022- ELIGARD q6M; MARCH 2022- DISCONTINUE ZYTIGA sec to PRESS [severe hypokalemia/mental status changes hospitalization];  #   # MAY 6th, 2022- XTnadi 3pills/day   # NGS/MOLECULAR TESTS: MARCH 2022-foundation 1 no targetable mutation./Genetic evaluation negative    # PALLIATIVE CARE EVALUATION:  # PAIN MANAGEMENT:    DIAGNOSIS: Prostate cancer  STAGE:   IV      ;  GOALS: Palliative  CURRENT/MOST RECENT THERAPY : ADT+ Xtandi    Prostate cancer metastatic to multiple sites [facility])  02/04/2020 Initial Diagnosis   Prostate cancer metastatic to multiple sites [facility])   03/19/2020 Cancer Staging   Staging form: Prostate, AJCC 8th Edition - Clinical: Stage IVB (pM1b) - Signed by Cammie Sickle, MD on 03/19/2020    Genetic Testing   Negative genetic testing. No pathogenic variants identified on the [facility] Multi-Cancer Panel. VUS in AXIN2 called c.1235A>C identified. The report date is 03/23/2020.   The Multi-Cancer Panel offered by [facility] includes sequencing and/or deletion duplication testing of the  following 84 genes: AIP, ALK, APC, ATM, AXIN2,BAP1,  BARD1, BLM, BMPR1A, BRCA1, BRCA2, BRIP1, CASR, CDC73, CDH1, CDK4, CDKN1B, CDKN1C, CDKN2A (p14ARF), CDKN2A (p16INK4a), CEBPA, CHEK2, CTNNA1, DICER1, DIS3L2, EGFR (c.2369C>T, p.Thr790Met variant only), EPCAM (Deletion/duplication testing only), FH, FLCN, GATA2, GPC3, GREM1 (Promoter region deletion/duplication testing only), HOXB13 (c.251G>A, p.Gly84Glu), HRAS, KIT, MAX, MEN1, MET, MITF (c.952G>A, p.Glu318Lys variant only), MLH1, MSH2, MSH3, MSH6, MUTYH, NBN, NF1, NF2, NTHL1, PALB2, PDGFRA, PHOX2B, PMS2, POLD1, POLE, POT1, PRKAR1A, PTCH1, PTEN, RAD50, RAD51C, RAD51D, RB1, RECQL4, RET, RUNX1, SDHAF2, SDHA (sequence changes only), SDHB, SDHC, SDHD, SMAD4, SMARCA4, SMARCB1, SMARCE1, STK11, SUFU, TERC, TERT, TMEM127, TP53, TSC1, TSC2, VHL, WRN and WT1.       HISTORY OF PRESENTING ILLNESS: Ambulating with a cane.  Accompanied by his daughter  Brent Glass 81 y.o.  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  He states that he is compliant with his oral Xtandi.   No fever no chills.  No constipation no diarrhea.  No nausea no vomiting.  Review of Systems  Constitutional:  Positive for malaise/fatigue. Negative for chills, diaphoresis and fever.  HENT:  Negative for nosebleeds and sore throat.   Eyes:  Negative for double vision.  Respiratory:  Negative for hemoptysis, sputum production, shortness of breath and wheezing.   Cardiovascular:  Negative for chest pain, palpitations, orthopnea and leg swelling.  Gastrointestinal:  Positive for abdominal pain. Negative for blood in stool, constipation, diarrhea, heartburn, melena, nausea and vomiting.  Genitourinary:  Negative for dysuria, frequency and urgency.  Musculoskeletal:  Positive for back pain and myalgias. Negative for joint pain.  Skin: Negative.   Negative for itching and rash.  Neurological:  Negative for dizziness, tingling, focal weakness, weakness and headaches.  Endo/Heme/Allergies:  Does not bruise/bleed easily.  Psychiatric/Behavioral:  Negative for depression. The patient is not nervous/anxious and does not have insomnia.     MEDICAL HISTORY:  Past Medical History:  Diagnosis Date  . Arthritis   . BPH (benign prostatic hyperplasia)   . BPH with obstruction/lower urinary tract symptoms   . Diabetes ([place])   . Disorder resulting from impaired renal function   . Elevated PSA   . Family history of lung cancer   . Family history of lymphoma   . Heart murmur   . Hypertension   . Knee pain   . Lesion of lung   . Low HDL (under 40)   . Obesity   . Urinary hesitancy     SURGICAL HISTORY: Past Surgical History:  Procedure Laterality Date  . HERNIA REPAIR  3338   Umbilical Hernia Repair  . KNEE ARTHROSCOPY Left 04/10/2015   Procedure: ARTHROSCOPY KNEE, partial medial meniscectomy, partial synovectomy;  Surgeon: Hessie Knows, MD;  Location: [facility] ORS;  Service: Orthopedics;  Laterality: Left;    SOCIAL HISTORY: Social History   Socioeconomic History  . Marital status: Widowed    Spouse name: Not on file  . Number of children: Not on file  . Years of education: Not on file  . Highest education level: Not on file  Occupational History  . Not on file  Tobacco Use  . Smoking status: Every Day    Packs/day: 1.00    Types: Cigarettes  . Smokeless tobacco: Never  Vaping Use  . Vaping Use: Never used  Substance and Sexual Activity  . Alcohol use: No    Alcohol/week: 0.0 standard drinks of alcohol  . Drug use: No  . Sexual activity: Not Currently  Other Topics Concern  . Not on file  Social History Narrative   Lives in [place]; self; daughter lives 1 mile. Smoke 1ppd > 65 years; stopped 25 years ago.  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    Social Determinants of Health   Financial  Resource Strain: Not on file  Food Insecurity: Not on file  Transportation Needs: Not on file  Physical Activity: Not on file  Stress: Not on file  Social Connections: Not on file  Intimate Partner Violence: Not on file    FAMILY HISTORY: Family History  Problem Relation Age of Onset  . Diabetes Mother   . Lung cancer Mother   . Lymphoma Son 28       died in 63.   . Kidney disease Neg Hx   . Prostate cancer Neg Hx     ALLERGIES:  has No Known Allergies.  MEDICATIONS:  Current Outpatient Medications  Medication Sig Dispense Refill  . acetaminophen (TYLENOL) 650 MG CR tablet Take 650 mg by mouth every 8 (eight) hours as needed for pain.    Marland Kitchen albuterol (PROVENTIL HFA;VENTOLIN HFA) 108 (90 BASE) MCG/ACT inhaler Inhale 2 puffs into the lungs every 6 (six) hours as needed for wheezing or shortness of breath.    Marland Kitchen aspirin 81 MG tablet Take 81 mg by mouth daily.    . cyanocobalamin 2000 MCG tablet Take 1 tablet (2,000 mcg total) by mouth daily. 90 tablet 1  . enzalutamide (XTANDI) 40 MG tablet Take 3 tablets (120 mg total) by mouth  daily. 120 tablet 3  . finasteride (PROSCAR) 5 MG tablet TAKE 1 TABLET BY MOUTH EVERY DAY (Patient taking differently: Take 5 mg by mouth daily.) 90 tablet 3  . GLUMETZA 1000 MG 24 hr tablet Take 1,000 mg by mouth 2 (two) times daily.    . metFORMIN (GLUCOPHAGE) 1000 MG tablet Take 1,000 mg by mouth 2 (two) times daily with a meal.    . rosuvastatin (CRESTOR) 20 MG tablet Take 20 mg by mouth every morning.    . simvastatin (ZOCOR) 40 MG tablet Take 40 mg by mouth daily.    Marland Kitchen thiamine 100 MG tablet TAKE 1 TABLET BY MOUTH EVERY DAY 90 tablet 1   No current facility-administered medications for this visit.      Marland Kitchen  PHYSICAL EXAMINATION: ECOG PERFORMANCE STATUS: 0 - Asymptomatic  There were no vitals filed for this visit.    There were no vitals filed for this visit.    Physical Exam HENT:     Head: Normocephalic and atraumatic.      Mouth/Throat:     Pharynx: No oropharyngeal exudate.  Eyes:     Pupils: Pupils are equal, round, and reactive to light.  Cardiovascular:     Rate and Rhythm: Normal rate and regular rhythm.  Pulmonary:     Effort: No respiratory distress.     Breath sounds: No wheezing.     Comments: Decreased breath sounds bilaterally. Abdominal:     General: Bowel sounds are normal. There is no distension.     Palpations: Abdomen is soft. There is no mass.     Tenderness: There is no abdominal tenderness. There is no guarding or rebound.  Musculoskeletal:        General: No tenderness. Normal range of motion.     Cervical back: Normal range of motion and neck supple.  Skin:    General: Skin is warm.  Neurological:     Mental Status: He is alert and oriented to person, place, and time.  Psychiatric:        Mood and Affect: Affect normal.     LABORATORY DATA:  I have reviewed the data as listed Lab Results  Component Value Date   WBC 9.8 07/24/2021   HGB 13.4 07/24/2021   HCT 41.4 07/24/2021   MCV 87.2 07/24/2021   PLT 357 07/24/2021   Recent Labs    03/13/21 1620 05/23/21 1557 06/24/21 0932 07/24/21 1335  NA 135 138 137 136  K 4.0 4.0 4.1 3.9  CL 105 105 103 103  CO2 23 25 27 25   GLUCOSE 110* 165* 140* 109*  BUN 18 12 10 19   CREATININE 0.83 1.05 1.05 1.15  CALCIUM 9.9 9.7 9.7 9.6  GFRNONAA >60 >60 >60 >60  PROT 7.6  --  7.0 7.7  ALBUMIN 3.8  --  3.8 4.1  AST 9*  --  14* 12*  ALT 7  --  10 7  ALKPHOS 101  --  89 103  BILITOT 0.7  --  0.4 0.4     Latest Reference Range & Units 01/23/20 12:22 03/10/20 14:11 03/19/20 09:19 04/16/20 09:10 05/14/20 10:06 06/13/20 09:26 07/11/20 10:13 08/07/20 09:24 09/04/20 10:13 09/16/20 13:59 10/21/20 13:59 12/23/20 13:27  Prostatic Specific Antigen 0.00 - 4.00 ng/mL 986.00 (H) 34.69 (H) 21.59 (H) 2.68 1.21 0.71 0.68 0.41 0.36 0.28 0.29 0.28  (H): Data is abnormally high  RADIOGRAPHIC STUDIES: I have personally reviewed the radiological  images as listed and agreed with the findings in  the report. No results found.  ASSESSMENT & PLAN:   No problem-specific Assessment & Plan notes found for this encounter.  All questions were answered. The patient knows to call the clinic with any problems, questions or concerns.    Cammie Sickle, MD 10/25/2021 1:53 PM

## 2021-10-25 NOTE — Assessment & Plan Note (Signed)
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  # continue X-tandi 120 mg/day-  Continue vitamin D.  STABLE  # Bil LE Swelling/ankles; weight gain 16 pounds- 2022- Echo-60-65%; start patient on Lasix 20 mg a day for 2 weeks.  Recommend patient go to PCP for further evaluation recommendations.  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  We will repeat a CT scan at next visit.  # Chest tenderness-grade 1-2. Sec to ADT- continue tramadol prn; add vaseline BID- STABLE.   * eliagrd 45 in nov 2023 again  # DISPOSITION:  # Follow up in  3 month - MD-;labs- cbc/cmp/PSA;ELIGARD-- Dr.B

## 2021-10-26 ENCOUNTER — Encounter: Payer: Self-pay | Admitting: Internal Medicine

## 2021-10-26 ENCOUNTER — Inpatient Hospital Stay (HOSPITAL_BASED_OUTPATIENT_CLINIC_OR_DEPARTMENT_OTHER): Payer: Medicare Other | Admitting: Internal Medicine

## 2021-10-26 ENCOUNTER — Inpatient Hospital Stay: Payer: Medicare Other | Attending: Internal Medicine

## 2021-10-26 VITALS — BP 126/63 | HR 77 | Temp 96.8°F | Resp 16 | Wt 216.8 lb

## 2021-10-26 DIAGNOSIS — C7951 Secondary malignant neoplasm of bone: Secondary | ICD-10-CM | POA: Diagnosis not present

## 2021-10-26 DIAGNOSIS — Z7982 Long term (current) use of aspirin: Secondary | ICD-10-CM | POA: Insufficient documentation

## 2021-10-26 DIAGNOSIS — Z79899 Other long term (current) drug therapy: Secondary | ICD-10-CM | POA: Diagnosis not present

## 2021-10-26 DIAGNOSIS — C7800 Secondary malignant neoplasm of unspecified lung: Secondary | ICD-10-CM | POA: Insufficient documentation

## 2021-10-26 DIAGNOSIS — Z7984 Long term (current) use of oral hypoglycemic drugs: Secondary | ICD-10-CM | POA: Diagnosis not present

## 2021-10-26 DIAGNOSIS — F17210 Nicotine dependence, cigarettes, uncomplicated: Secondary | ICD-10-CM | POA: Diagnosis not present

## 2021-10-26 DIAGNOSIS — C61 Malignant neoplasm of prostate: Secondary | ICD-10-CM | POA: Diagnosis present

## 2021-10-26 DIAGNOSIS — R918 Other nonspecific abnormal finding of lung field: Secondary | ICD-10-CM

## 2021-10-26 LAB — CBC WITH DIFFERENTIAL/PLATELET
Abs Immature Granulocytes: 0.1 10*3/uL — ABNORMAL HIGH (ref 0.00–0.07)
Basophils Absolute: 0.1 10*3/uL (ref 0.0–0.1)
Basophils Relative: 1 %
Eosinophils Absolute: 0.2 10*3/uL (ref 0.0–0.5)
Eosinophils Relative: 2 %
HCT: 38.2 % — ABNORMAL LOW (ref 39.0–52.0)
Hemoglobin: 12.5 g/dL — ABNORMAL LOW (ref 13.0–17.0)
Immature Granulocytes: 1 %
Lymphocytes Relative: 43 %
Lymphs Abs: 4.3 10*3/uL — ABNORMAL HIGH (ref 0.7–4.0)
MCH: 28.5 pg (ref 26.0–34.0)
MCHC: 32.7 g/dL (ref 30.0–36.0)
MCV: 87 fL (ref 80.0–100.0)
Monocytes Absolute: 0.7 10*3/uL (ref 0.1–1.0)
Monocytes Relative: 7 %
Neutro Abs: 4.6 10*3/uL (ref 1.7–7.7)
Neutrophils Relative %: 46 %
Platelets: 337 10*3/uL (ref 150–400)
RBC: 4.39 MIL/uL (ref 4.22–5.81)
RDW: 14.1 % (ref 11.5–15.5)
WBC: 10.1 10*3/uL (ref 4.0–10.5)
nRBC: 0 % (ref 0.0–0.2)

## 2021-10-26 LAB — COMPREHENSIVE METABOLIC PANEL
ALT: 9 U/L (ref 0–44)
AST: 16 U/L (ref 15–41)
Albumin: 3.7 g/dL (ref 3.5–5.0)
Alkaline Phosphatase: 98 U/L (ref 38–126)
Anion gap: 8 (ref 5–15)
BUN: 18 mg/dL (ref 8–23)
CO2: 24 mmol/L (ref 22–32)
Calcium: 9.6 mg/dL (ref 8.9–10.3)
Chloride: 102 mmol/L (ref 98–111)
Creatinine, Ser: 1.17 mg/dL (ref 0.61–1.24)
GFR, Estimated: >60 mL/min (ref >60)
Glucose, Bld: 279 mg/dL — ABNORMAL HIGH (ref 70–99)
Potassium: 3.9 mmol/L (ref 3.5–5.1)
Sodium: 134 mmol/L — ABNORMAL LOW (ref 135–145)
Total Bilirubin: 0.6 mg/dL (ref 0.3–1.2)
Total Protein: 7.1 g/dL (ref 6.5–8.1)

## 2021-10-26 LAB — PSA: Prostatic Specific Antigen: 0.36 ng/mL (ref 0.00–4.00)

## 2021-10-26 MED ORDER — FUROSEMIDE 20 MG PO TABS: 20.0000 mg | ORAL_TABLET | Freq: Every day | ORAL | 0 refills | Status: AC

## 2021-10-26 NOTE — Progress Notes (Unsigned)
Patient's daughter has noticed an increase in ankles.  Patient has intermittent right side rib pain that radiates to the back, 3/10 pain scale today.

## 2021-10-27 ENCOUNTER — Encounter: Payer: Self-pay | Admitting: Internal Medicine

## 2021-11-04 ENCOUNTER — Other Ambulatory Visit: Payer: Self-pay | Admitting: Internal Medicine

## 2021-11-04 NOTE — Telephone Encounter (Signed)
Dr. B sent in rx last week.

## 2021-11-04 NOTE — Telephone Encounter (Signed)
Rx sent on 10/26/2021 was for quantity #15

## 2021-11-10 ENCOUNTER — Other Ambulatory Visit (HOSPITAL_COMMUNITY): Payer: Self-pay

## 2021-11-11 ENCOUNTER — Other Ambulatory Visit (HOSPITAL_COMMUNITY): Payer: Self-pay

## 2021-11-23 ENCOUNTER — Other Ambulatory Visit: Payer: Self-pay | Admitting: Internal Medicine

## 2021-12-04 ENCOUNTER — Other Ambulatory Visit (HOSPITAL_COMMUNITY): Payer: Self-pay

## 2021-12-07 ENCOUNTER — Other Ambulatory Visit (HOSPITAL_COMMUNITY): Payer: Self-pay

## 2021-12-09 ENCOUNTER — Other Ambulatory Visit (HOSPITAL_COMMUNITY): Payer: Self-pay

## 2021-12-25 ENCOUNTER — Other Ambulatory Visit (HOSPITAL_COMMUNITY): Payer: Self-pay

## 2021-12-31 ENCOUNTER — Other Ambulatory Visit (HOSPITAL_COMMUNITY): Payer: Self-pay

## 2021-12-31 ENCOUNTER — Other Ambulatory Visit: Payer: Self-pay | Admitting: Internal Medicine

## 2021-12-31 DIAGNOSIS — C61 Malignant neoplasm of prostate: Secondary | ICD-10-CM

## 2022-01-01 ENCOUNTER — Encounter: Payer: Self-pay | Admitting: Internal Medicine

## 2022-01-01 MED ORDER — ENZALUTAMIDE 40 MG PO TABS
120.0000 mg | ORAL_TABLET | Freq: Every day | ORAL | 3 refills | Status: DC
  Filled 2022-01-01: qty 90, 30d supply, fill #0

## 2022-01-01 NOTE — Telephone Encounter (Signed)
Last MD visit 10/26/21: # continue X-tandi 120 mg/day-  Continue vitamin D.  STABLE  Next visit: 11/21

## 2022-01-02 ENCOUNTER — Other Ambulatory Visit (HOSPITAL_COMMUNITY): Payer: Self-pay

## 2022-01-04 ENCOUNTER — Other Ambulatory Visit (HOSPITAL_COMMUNITY): Payer: Self-pay

## 2022-01-05 IMAGING — CR DG HIP (WITH OR WITHOUT PELVIS) 2-3V*R*
3 series · 3 of 3 positions shown · non-contrast
Comparison: PET-CT 01/24/2020

CLINICAL DATA: Right hip pain.  History of prostate cancer

EXAM:
DG HIP (WITH OR WITHOUT PELVIS) 2-3V RIGHT

[pelvis ap]
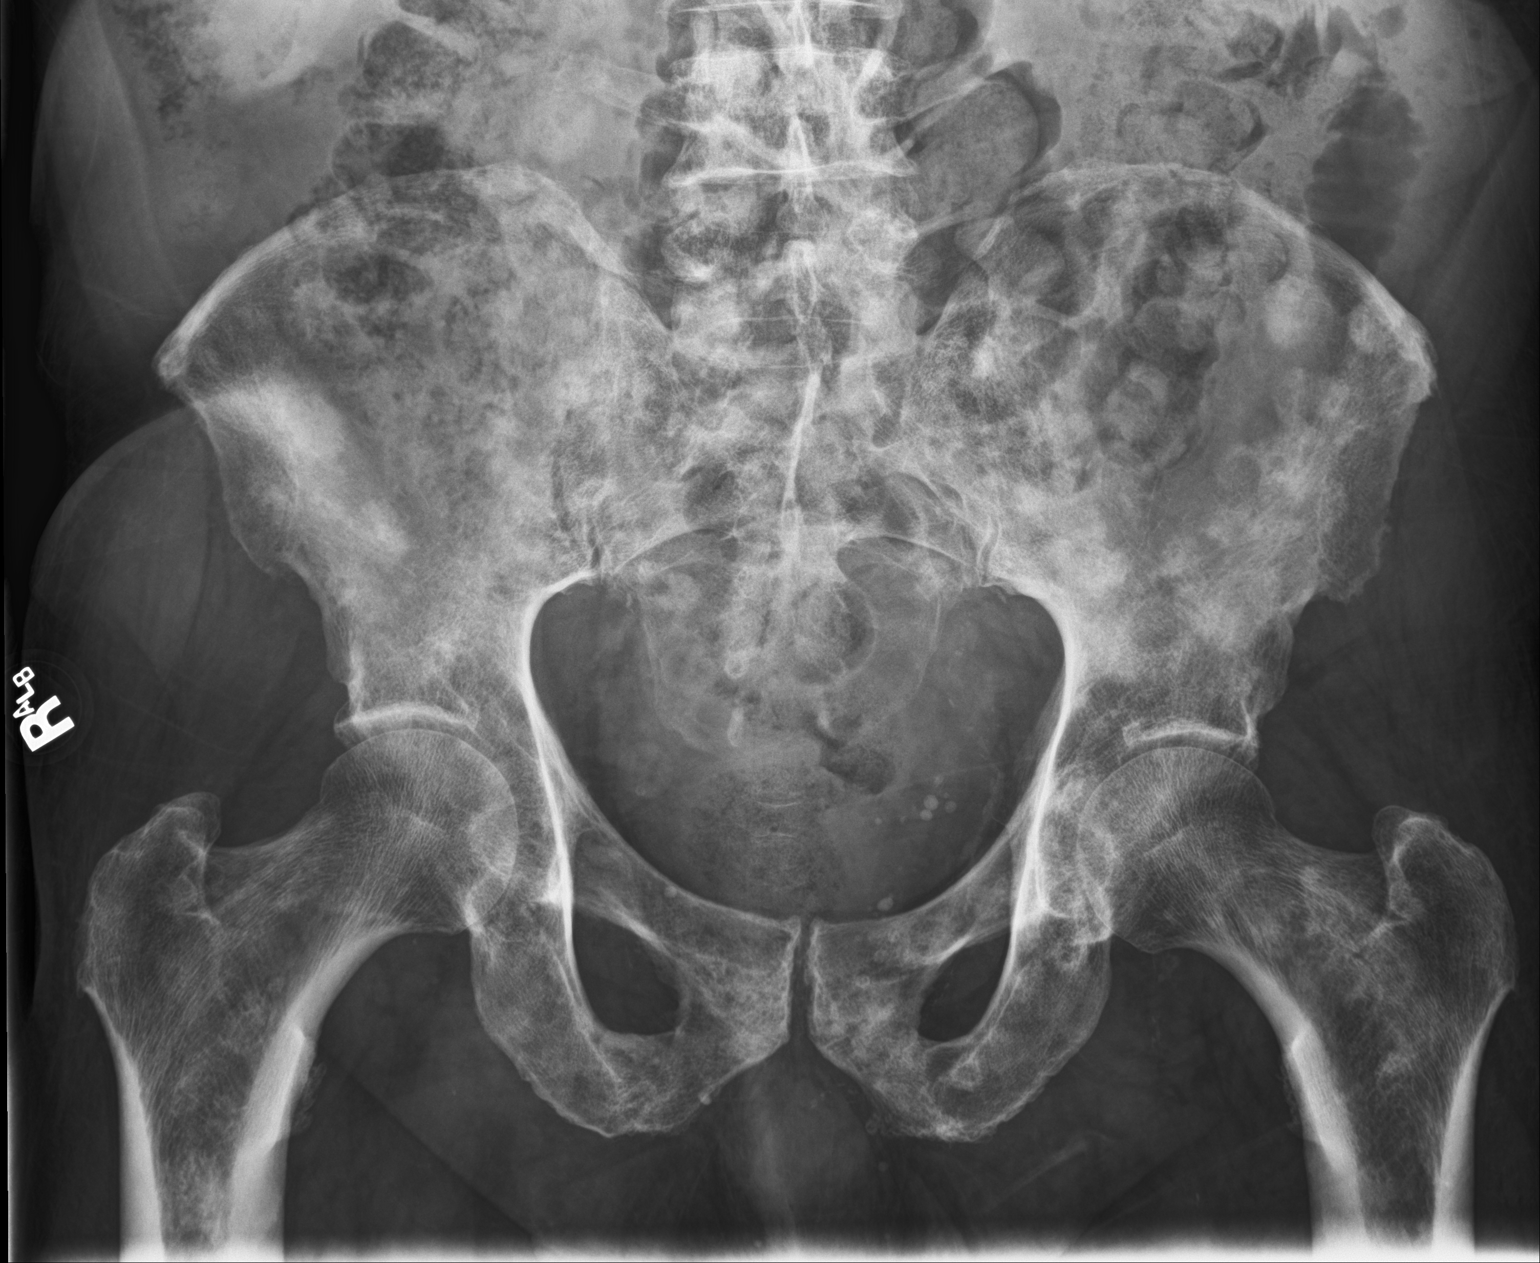

[hip ap]
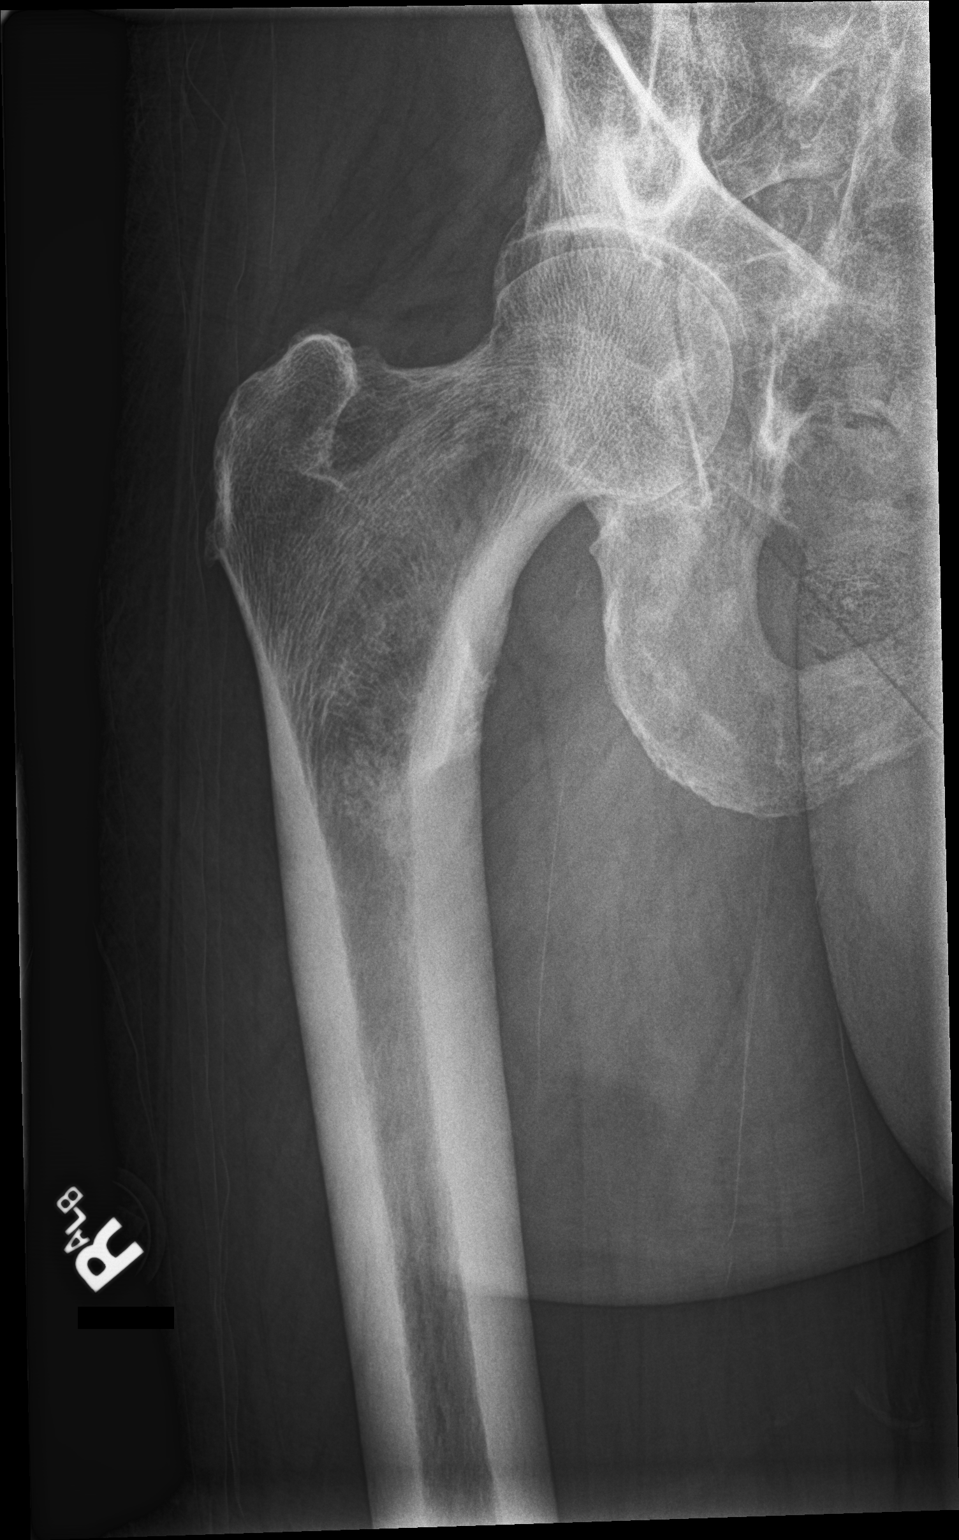

[hip lat]
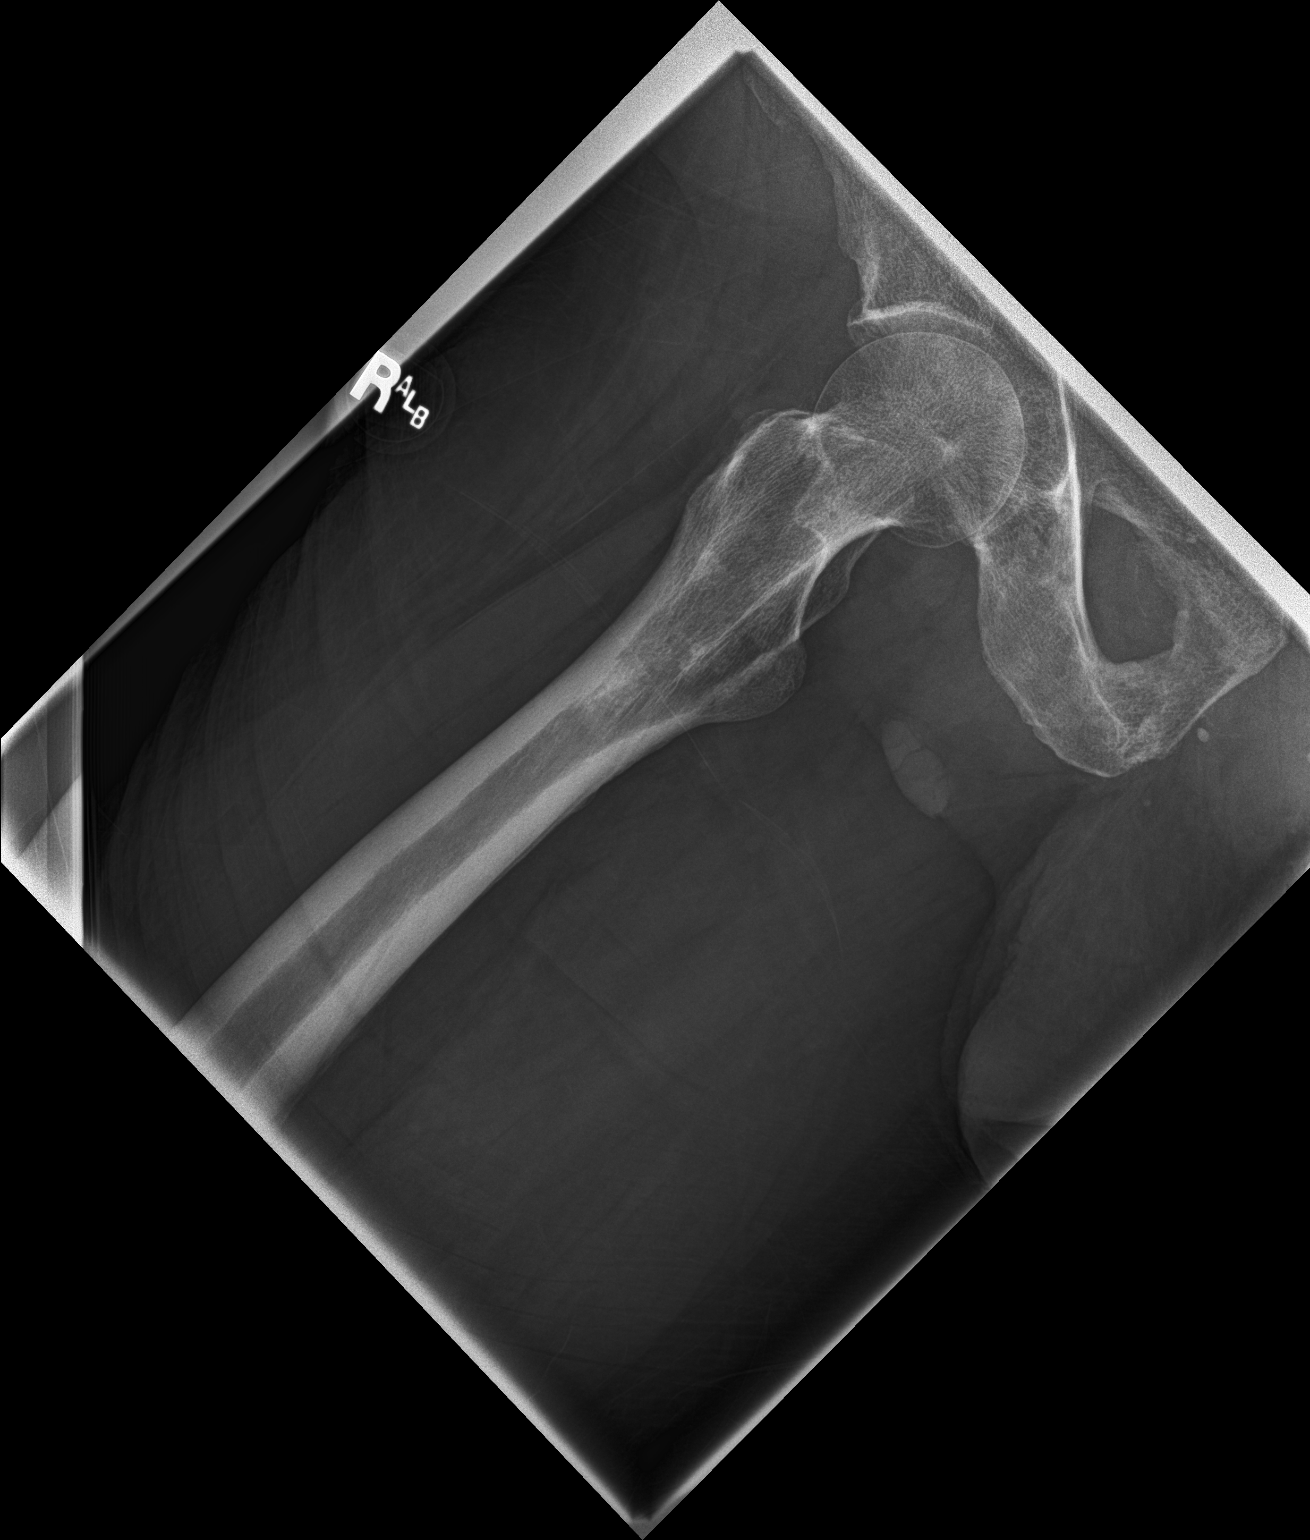

[3 of 3 positions shown; findings below may reference images not displayed]

FINDINGS: There is no evidence of hip fracture or dislocation. Hip joint
spaces are maintained without significant arthropathy. Extensive
diffuse sclerotic osseous metastatic disease throughout the pelvis
and visualized proximal femurs.
IMPRESSION: 1. No acute osseous abnormality or significant arthropathy of the
right hip.
2. Extensive diffuse sclerotic osseous metastatic disease.

## 2022-01-06 ENCOUNTER — Other Ambulatory Visit (HOSPITAL_COMMUNITY): Payer: Self-pay

## 2022-01-06 ENCOUNTER — Telehealth: Payer: Self-pay

## 2022-01-06 NOTE — Telephone Encounter (Signed)
Oral Oncology Patient Advocate Encounter  Completed application for XTANDI in an effort to reduce patient's out of pocket expense to $0.    Application completed online and submitted to [employer].   [place] patient assistance phone number for follow up is [phone].   This encounter will be updated until final determination.    Berdine Addison, [facility] [place]  [phone] (phone) [phone] (fax) 01/06/2022 8:40 AM

## 2022-01-11 ENCOUNTER — Other Ambulatory Visit (HOSPITAL_COMMUNITY): Payer: Self-pay

## 2022-01-13 ENCOUNTER — Other Ambulatory Visit (HOSPITAL_COMMUNITY): Payer: Self-pay

## 2022-01-13 ENCOUNTER — Encounter: Payer: Self-pay | Admitting: Internal Medicine

## 2022-01-13 ENCOUNTER — Telehealth: Payer: Self-pay

## 2022-01-13 NOTE — Telephone Encounter (Signed)
Oral Oncology Patient Advocate Encounter   Was successful in securing patient a $5,500 grant from [employer] [place] (PAF) to provide copayment coverage for Xtandi.  This will keep the out of pocket expense at $0.     I have spoken with the patient.    The billing information is as follows and has been shared with [facility].   RxBin: [id number] PCN:  [id number] Member ID: [id number] Group ID: [id number] Dates of Eligibility: 05.12.23 through 11.07.24  Brent Glass, Brent Glass  [phone] (phone) [phone] (fax) 01/13/2022 2:36 PM

## 2022-01-14 NOTE — Telephone Encounter (Signed)
Oral Oncology Patient Advocate Encounter   Received notification that the application for assistance for Xtandi through [employer] has been approved.   Xtandi's phone number [phone].   Effective dates: 11.09.23 through 12.31.23.  I have spoken to the patient.  Berdine Addison, [facility] [place]  [phone] (phone) [phone] (fax) 01/14/2022 3:03 PM

## 2022-01-20 ENCOUNTER — Other Ambulatory Visit: Payer: Self-pay | Admitting: Pharmacist

## 2022-01-20 DIAGNOSIS — C61 Malignant neoplasm of prostate: Secondary | ICD-10-CM

## 2022-01-20 MED ORDER — ENZALUTAMIDE 40 MG PO TABS: 120.0000 mg | ORAL_TABLET | Freq: Every day | ORAL | 3 refills | Status: DC

## 2022-01-26 ENCOUNTER — Encounter: Payer: Self-pay | Admitting: Internal Medicine

## 2022-01-26 ENCOUNTER — Inpatient Hospital Stay: Payer: Medicare Other

## 2022-01-26 ENCOUNTER — Inpatient Hospital Stay: Payer: Medicare Other | Attending: Internal Medicine | Admitting: Internal Medicine

## 2022-01-26 VITALS — BP 137/70 | HR 71 | Temp 96.9°F | Resp 20 | Wt 211.6 lb

## 2022-01-26 DIAGNOSIS — C61 Malignant neoplasm of prostate: Secondary | ICD-10-CM

## 2022-01-26 DIAGNOSIS — Z7982 Long term (current) use of aspirin: Secondary | ICD-10-CM | POA: Insufficient documentation

## 2022-01-26 DIAGNOSIS — C7951 Secondary malignant neoplasm of bone: Secondary | ICD-10-CM | POA: Diagnosis present

## 2022-01-26 DIAGNOSIS — R918 Other nonspecific abnormal finding of lung field: Secondary | ICD-10-CM | POA: Diagnosis not present

## 2022-01-26 DIAGNOSIS — Z7984 Long term (current) use of oral hypoglycemic drugs: Secondary | ICD-10-CM | POA: Insufficient documentation

## 2022-01-26 DIAGNOSIS — M7989 Other specified soft tissue disorders: Secondary | ICD-10-CM

## 2022-01-26 DIAGNOSIS — C7800 Secondary malignant neoplasm of unspecified lung: Secondary | ICD-10-CM | POA: Insufficient documentation

## 2022-01-26 DIAGNOSIS — E119 Type 2 diabetes mellitus without complications: Secondary | ICD-10-CM | POA: Diagnosis not present

## 2022-01-26 DIAGNOSIS — I10 Essential (primary) hypertension: Secondary | ICD-10-CM | POA: Insufficient documentation

## 2022-01-26 DIAGNOSIS — Z79899 Other long term (current) drug therapy: Secondary | ICD-10-CM | POA: Insufficient documentation

## 2022-01-26 DIAGNOSIS — F17210 Nicotine dependence, cigarettes, uncomplicated: Secondary | ICD-10-CM | POA: Insufficient documentation

## 2022-01-26 LAB — COMPREHENSIVE METABOLIC PANEL
ALT: 8 U/L (ref 0–44)
AST: 12 U/L — ABNORMAL LOW (ref 15–41)
Albumin: 3.6 g/dL (ref 3.5–5.0)
Alkaline Phosphatase: 123 U/L (ref 38–126)
Anion gap: 6 (ref 5–15)
BUN: 15 mg/dL (ref 8–23)
CO2: 23 mmol/L (ref 22–32)
Calcium: 9.6 mg/dL (ref 8.9–10.3)
Chloride: 103 mmol/L (ref 98–111)
Creatinine, Ser: 1.2 mg/dL (ref 0.61–1.24)
GFR, Estimated: >60 mL/min (ref >60)
Glucose, Bld: 323 mg/dL — ABNORMAL HIGH (ref 70–99)
Potassium: 3.8 mmol/L (ref 3.5–5.1)
Sodium: 132 mmol/L — ABNORMAL LOW (ref 135–145)
Total Bilirubin: 0.3 mg/dL (ref 0.3–1.2)
Total Protein: 6.9 g/dL (ref 6.5–8.1)

## 2022-01-26 LAB — CBC WITH DIFFERENTIAL/PLATELET
Abs Immature Granulocytes: 0.03 10*3/uL (ref 0.00–0.07)
Basophils Absolute: 0.1 10*3/uL (ref 0.0–0.1)
Basophils Relative: 1 %
Eosinophils Absolute: 0.2 10*3/uL (ref 0.0–0.5)
Eosinophils Relative: 2 %
HCT: 37.9 % — ABNORMAL LOW (ref 39.0–52.0)
Hemoglobin: 12.4 g/dL — ABNORMAL LOW (ref 13.0–17.0)
Immature Granulocytes: 0 %
Lymphocytes Relative: 39 %
Lymphs Abs: 3.2 10*3/uL (ref 0.7–4.0)
MCH: 28.5 pg (ref 26.0–34.0)
MCHC: 32.7 g/dL (ref 30.0–36.0)
MCV: 87.1 fL (ref 80.0–100.0)
Monocytes Absolute: 0.6 10*3/uL (ref 0.1–1.0)
Monocytes Relative: 8 %
Neutro Abs: 4.2 10*3/uL (ref 1.7–7.7)
Neutrophils Relative %: 50 %
Platelets: 324 10*3/uL (ref 150–400)
RBC: 4.35 MIL/uL (ref 4.22–5.81)
RDW: 13.5 % (ref 11.5–15.5)
WBC: 8.3 10*3/uL (ref 4.0–10.5)
nRBC: 0 % (ref 0.0–0.2)

## 2022-01-26 LAB — PSA: Prostatic Specific Antigen: 0.59 ng/mL (ref 0.00–4.00)

## 2022-01-26 MED ORDER — LEUPROLIDE ACETATE (6 MONTH) 45 MG ~~LOC~~ KIT
45.0000 mg | PACK | Freq: Once | SUBCUTANEOUS | Status: AC
  Administered 2022-01-26: 45 mg via SUBCUTANEOUS
  Filled 2022-01-26: qty 45

## 2022-01-26 NOTE — Progress Notes (Signed)
Patient has no concerns 

## 2022-01-26 NOTE — Progress Notes (Signed)
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   # NOV 2021- CTA [ER] Diffuse bilateral pulmonary nodules including a cavitary lesion in the right upper lobe measuring approximately 2 cm. There is extensive nodular pleural thickening bilaterally, greatest in the left lower lobe.  November 2021 PET scan-multiple lung lesions multiple bone lesions and prostate uptake.  February 2022-prostate biopsy [Dr.Brandon]   # DM-2; NO COPD [?]; ACTIVE SMOKER 65ppd.  # DEC 2nd week- FIRMAGON; # JAN 12TH, 2022-ZYTIGA  1000 MG+ PRED 5MG; FEB MID 2022- ELIGARD q6M; MARCH 2022- DISCONTINUE ZYTIGA sec to PRESS [severe hypokalemia/mental status changes hospitalization];  #   # MAY 6th, 2022- XTnadi 3pills/day   # NGS/MOLECULAR TESTS: MARCH 2022-foundation 1 no targetable mutation./Genetic evaluation negative    # PALLIATIVE CARE EVALUATION:  # PAIN MANAGEMENT:    DIAGNOSIS: Prostate cancer  STAGE:   IV      ;  GOALS: Palliative  CURRENT/MOST RECENT THERAPY : ADT+ Xtandi    Prostate cancer metastatic to multiple sites [facility])  02/04/2020 Initial Diagnosis   Prostate cancer metastatic to multiple sites [facility])   03/19/2020 Cancer Staging   Staging form: Prostate, AJCC 8th Edition - Clinical: Stage IVB (pM1b) - Signed by Cammie Sickle, MD on 03/19/2020    Genetic Testing   Negative genetic testing. No pathogenic variants identified on the [facility] Multi-Cancer Panel. VUS in AXIN2 called c.1235A>C identified. The report date is 03/23/2020.   The Multi-Cancer Panel offered by [facility] includes sequencing and/or deletion duplication testing of the  following 84 genes: AIP, ALK, APC, ATM, AXIN2,BAP1,  BARD1, BLM, BMPR1A, BRCA1, BRCA2, BRIP1, CASR, CDC73, CDH1, CDK4, CDKN1B, CDKN1C, CDKN2A (p14ARF), CDKN2A (p16INK4a), CEBPA, CHEK2, CTNNA1, DICER1, DIS3L2, EGFR (c.2369C>T, p.Thr790Met variant only), EPCAM (Deletion/duplication testing only), FH, FLCN, GATA2, GPC3, GREM1 (Promoter region deletion/duplication testing only), HOXB13 (c.251G>A, p.Gly84Glu), HRAS, KIT, MAX, MEN1, MET, MITF (c.952G>A, p.Glu318Lys variant only), MLH1, MSH2, MSH3, MSH6, MUTYH, NBN, NF1, NF2, NTHL1, PALB2, PDGFRA, PHOX2B, PMS2, POLD1, POLE, POT1, PRKAR1A, PTCH1, PTEN, RAD50, RAD51C, RAD51D, RB1, RECQL4, RET, RUNX1, SDHAF2, SDHA (sequence changes only), SDHB, SDHC, SDHD, SMAD4, SMARCA4, SMARCB1, SMARCE1, STK11, SUFU, TERC, TERT, TMEM127, TP53, TSC1, TSC2, VHL, WRN and WT1.       HISTORY OF PRESENTING ILLNESS: Ambulating with a cane.  Accompanied by his daughter  Brent Glass 81 y.o.  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  Patient continues to have tenderness in the chest since he started ADT. Has Hot flashes.  Appetite is good.  Energy level is fair.  He ran out of Lasix couple of weeks ago.  Review of Systems  Constitutional:  Positive for malaise/fatigue. Negative for chills, diaphoresis and fever.  HENT:  Negative for nosebleeds and sore throat.   Eyes:  Negative for double vision.  Respiratory:  Negative for hemoptysis, sputum production, shortness of breath and wheezing.   Cardiovascular:  Negative for chest pain, palpitations, orthopnea and leg swelling.  Gastrointestinal:  Negative for blood in stool, constipation, diarrhea, heartburn, melena, nausea and vomiting.  Genitourinary:  Negative for dysuria, frequency and urgency.  Musculoskeletal:  Positive for back pain and myalgias.  Negative for joint pain.  Skin: Negative.  Negative for itching and rash.  Neurological:  Negative for  dizziness, tingling, focal weakness, weakness and headaches.  Endo/Heme/Allergies:  Does not bruise/bleed easily.  Psychiatric/Behavioral:  Negative for depression. The patient is not nervous/anxious and does not have insomnia.      MEDICAL HISTORY:  Past Medical History:  Diagnosis Date   Arthritis    BPH (benign prostatic hyperplasia)    BPH with obstruction/lower urinary tract symptoms    Diabetes (HCC)    Disorder resulting from impaired renal function    Elevated PSA    Family history of lung cancer    Family history of lymphoma    Heart murmur    Hypertension    Knee pain    Lesion of lung    Low HDL (under 40)    Obesity    Urinary hesitancy     SURGICAL HISTORY: Past Surgical History:  Procedure Laterality Date   HERNIA REPAIR  [id number]   Umbilical Hernia Repair   KNEE ARTHROSCOPY Left 04/10/2015   Procedure: ARTHROSCOPY KNEE, partial medial meniscectomy, partial synovectomy;  Surgeon: Hessie Knows, MD;  Location: [facility] ORS;  Service: Orthopedics;  Laterality: Left;    SOCIAL HISTORY: Social History   Socioeconomic History   Marital status: Widowed    Spouse name: Not on file   Number of children: Not on file   Years of education: Not on file   Highest education level: Not on file  Occupational History   Not on file  Tobacco Use   Smoking status: Every Day    Packs/day: 1.00    Types: Cigarettes   Smokeless tobacco: Never  Vaping Use   Vaping Use: Never used  Substance and Sexual Activity   Alcohol use: No    Alcohol/week: 0.0 standard drinks of alcohol   Drug use: No   Sexual activity: Not Currently  Other Topics Concern   Not on file  Social History Narrative   Lives in [place]; self; daughter lives 1 mile. Smoke 1ppd > 65 years; stopped 25 years ago.  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    Social Determinants of Health   Financial Resource Strain: Not on file  Food Insecurity: Not on file  Transportation Needs: Not on  file  Physical Activity: Not on file  Stress: Not on file  Social Connections: Not on file  Intimate Partner Violence: Not on file    FAMILY HISTORY: Family History  Problem Relation Age of Onset   Diabetes Mother    Lung cancer Mother    Lymphoma Son 28       died in 34.    Kidney disease Neg Hx    Prostate cancer Neg Hx     ALLERGIES:  has No Known Allergies.  MEDICATIONS:  Current Outpatient Medications  Medication Sig Dispense Refill   acetaminophen (TYLENOL) 650 MG CR tablet Take 650 mg by mouth every 8 (eight) hours as needed for pain.     albuterol (PROVENTIL HFA;VENTOLIN HFA) 108 (90 BASE) MCG/ACT inhaler Inhale 2 puffs into the lungs every 6 (six) hours as needed for wheezing or shortness of breath.     aspirin 81 MG tablet Take 81 mg by mouth daily.     cyanocobalamin 2000 MCG tablet Take 1 tablet (2,000 mcg total) by mouth daily. 90 tablet 1   enzalutamide (XTANDI) 40 MG tablet Take 3 tablets (120 mg total) by mouth daily. 90 tablet 3   finasteride (  PROSCAR) 5 MG tablet TAKE 1 TABLET BY MOUTH EVERY DAY (Patient taking differently: Take 5 mg by mouth daily.) 90 tablet 3   furosemide (LASIX) 20 MG tablet Take 1 tablet (20 mg total) by mouth daily. 15 tablet 0   GLUMETZA 1000 MG 24 hr tablet Take 1,000 mg by mouth 2 (two) times daily.     metFORMIN (GLUCOPHAGE) 1000 MG tablet Take 1,000 mg by mouth 2 (two) times daily with a meal.     rosuvastatin (CRESTOR) 20 MG tablet Take 20 mg by mouth every morning.     simvastatin (ZOCOR) 40 MG tablet Take 40 mg by mouth daily.     thiamine 100 MG tablet TAKE 1 TABLET BY MOUTH EVERY DAY 90 tablet 1   No current facility-administered medications for this visit.      Marland Kitchen  PHYSICAL EXAMINATION: ECOG PERFORMANCE STATUS: 0 - Asymptomatic  Vitals:   01/26/22 1320  BP: 137/70  Pulse: 71  Resp: 20  Temp: (!) 96.9 F (36.1 C)  SpO2: 100%     Filed Weights   01/26/22 1320  Weight: 211 lb 9.6 oz (96 kg)     Physical  Exam HENT:     Head: Normocephalic and atraumatic.     Mouth/Throat:     Pharynx: No oropharyngeal exudate.  Eyes:     Pupils: Pupils are equal, round, and reactive to light.  Cardiovascular:     Rate and Rhythm: Normal rate and regular rhythm.  Pulmonary:     Effort: No respiratory distress.     Breath sounds: No wheezing.     Comments: Decreased breath sounds bilaterally. Abdominal:     General: Bowel sounds are normal. There is no distension.     Palpations: Abdomen is soft. There is no mass.     Tenderness: There is no abdominal tenderness. There is no guarding or rebound.  Musculoskeletal:        General: No tenderness. Normal range of motion.     Cervical back: Normal range of motion and neck supple.  Skin:    General: Skin is warm.  Neurological:     Mental Status: He is alert and oriented to person, place, and time.  Psychiatric:        Mood and Affect: Affect normal.      LABORATORY DATA:  I have reviewed the data as listed Lab Results  Component Value Date   WBC 8.3 01/26/2022   HGB 12.4 (L) 01/26/2022   HCT 37.9 (L) 01/26/2022   MCV 87.1 01/26/2022   PLT 324 01/26/2022   Recent Labs    06/24/21 0932 07/24/21 1335 10/26/21 1401  NA 137 136 134*  K 4.1 3.9 3.9  CL 103 103 102  CO2 _0 GLUCOSE 140* 109* 279*  BUN _1 CREATININE 1.05 1.15 1.17  CALCIUM 9.7 9.6 9.6  GFRNONAA >60 >60 >60  PROT 7.0 7.7 7.1  ALBUMIN 3.8 4.1 3.7  AST 14* 12* 16  ALT _2 ALKPHOS 89 103 98  BILITOT 0.4 0.4 0.6     Latest Reference Range & Units 01/23/20 12:22 03/10/20 14:11 03/19/20 09:19 04/16/20 09:10 05/14/20 10:06 06/13/20 09:26 07/11/20 10:13 08/07/20 09:24 09/04/20 10:13 09/16/20 13:59 10/21/20 13:59 12/23/20 13:27  Prostatic Specific Antigen 0.00 - 4.00 ng/mL 986.00 (H) 34.69 (H) 21.59 (H) 2.68 1.21 0.71 0.68 0.41 0.36 0.28 0.29 0.28  (H): Data is abnormally high  RADIOGRAPHIC STUDIES: I have personally reviewed the radiological  images as  listed and agreed with the findings in the report. No results found.  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   # continue X-tandi 120 mg/day-  Continue vitamin D.  Eligard 49-monthinjection today.  # Bil LE Swelling/ankles; weight gain 16 pounds- 2022- Echo-60-65%; patient ran out of Lasix couple of weeks ago.  He has bilateral lower extremity swelling on exam.  Advised to discuss with PCP.   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  During last visit, plan was to repeat CT chest in 3 months.  For some reason, it did not get scheduled.  Will schedule CT.    # Chest tenderness-grade 1-2. Sec to ADT- continue tramadol prn; add vaseline BID STABLE.     # DISPOSITION:  # Eligard 61-monthnjection today # Schedule CT chest # Follow up in  3 month - MD-;labs- cbc/cmp/PSA;      All questions were answered. The patient knows to call the clinic with any problems, questions or concerns.    KaJane CanaryMD 01/26/2022 1:37 PM

## 2022-01-29 ENCOUNTER — Other Ambulatory Visit (HOSPITAL_COMMUNITY): Payer: Self-pay

## 2022-02-05 IMAGING — DX DG SHOULDER 2+V*L*
3 series · 3 of 3 positions shown · non-contrast
Comparison: None.

CLINICAL DATA: Status post trauma.

EXAM:
LEFT SHOULDER - 2+ VIEW

[shoulder axial]
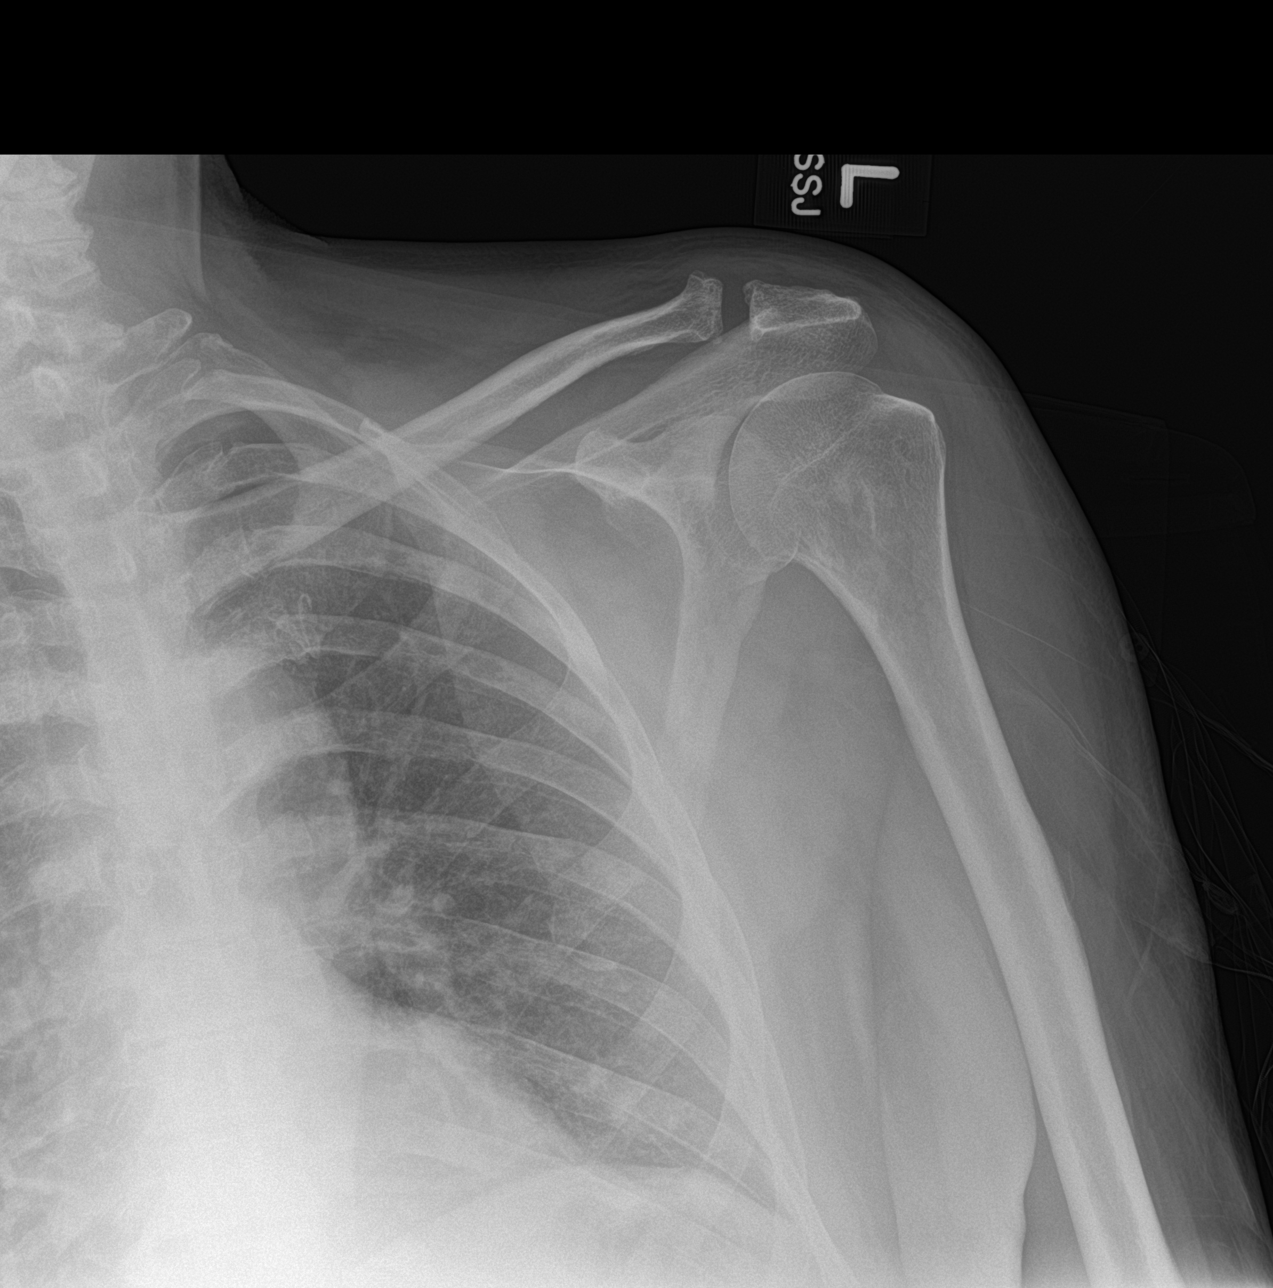

[shoulder swimmer]
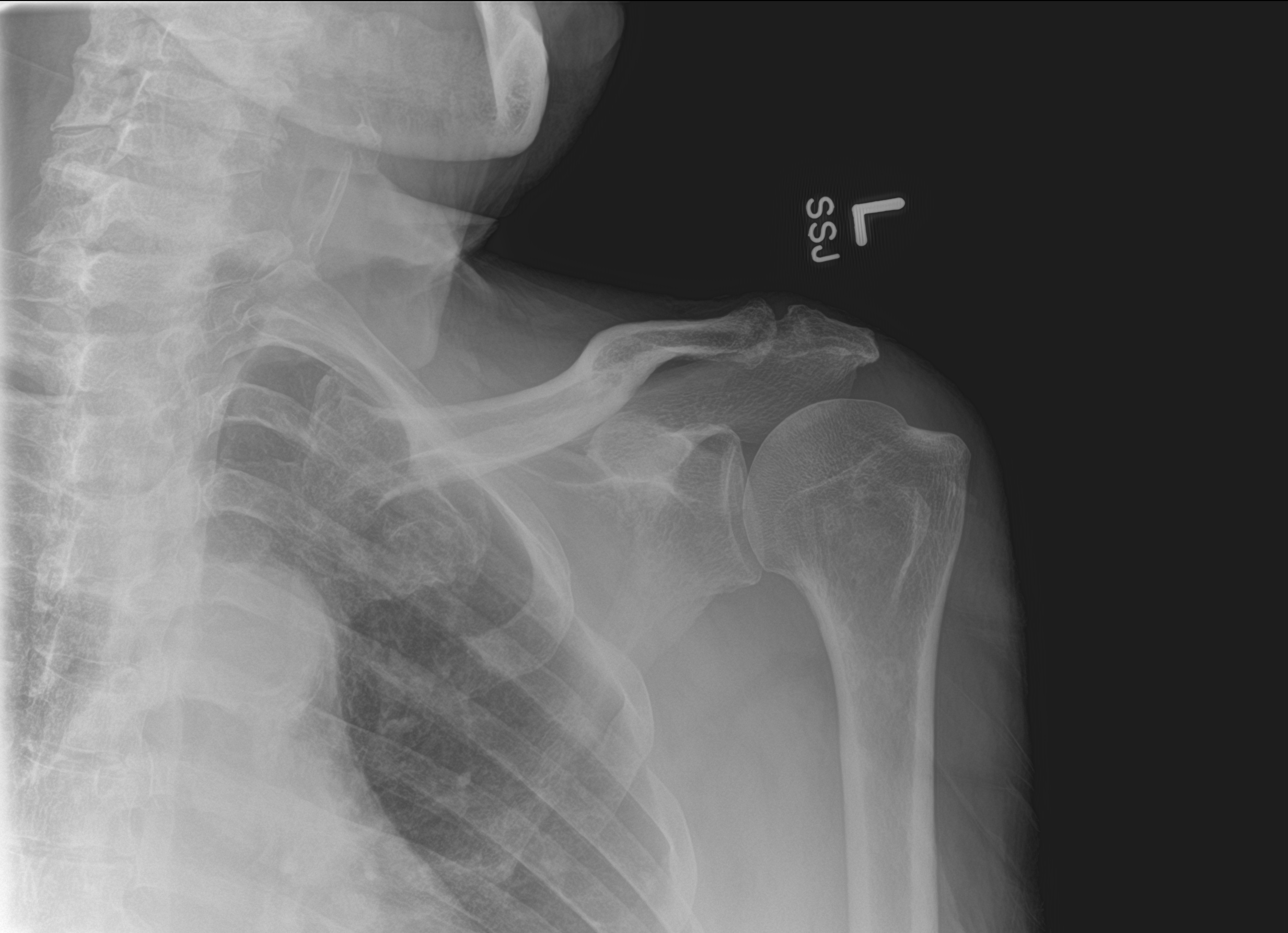

[shoulder ap]
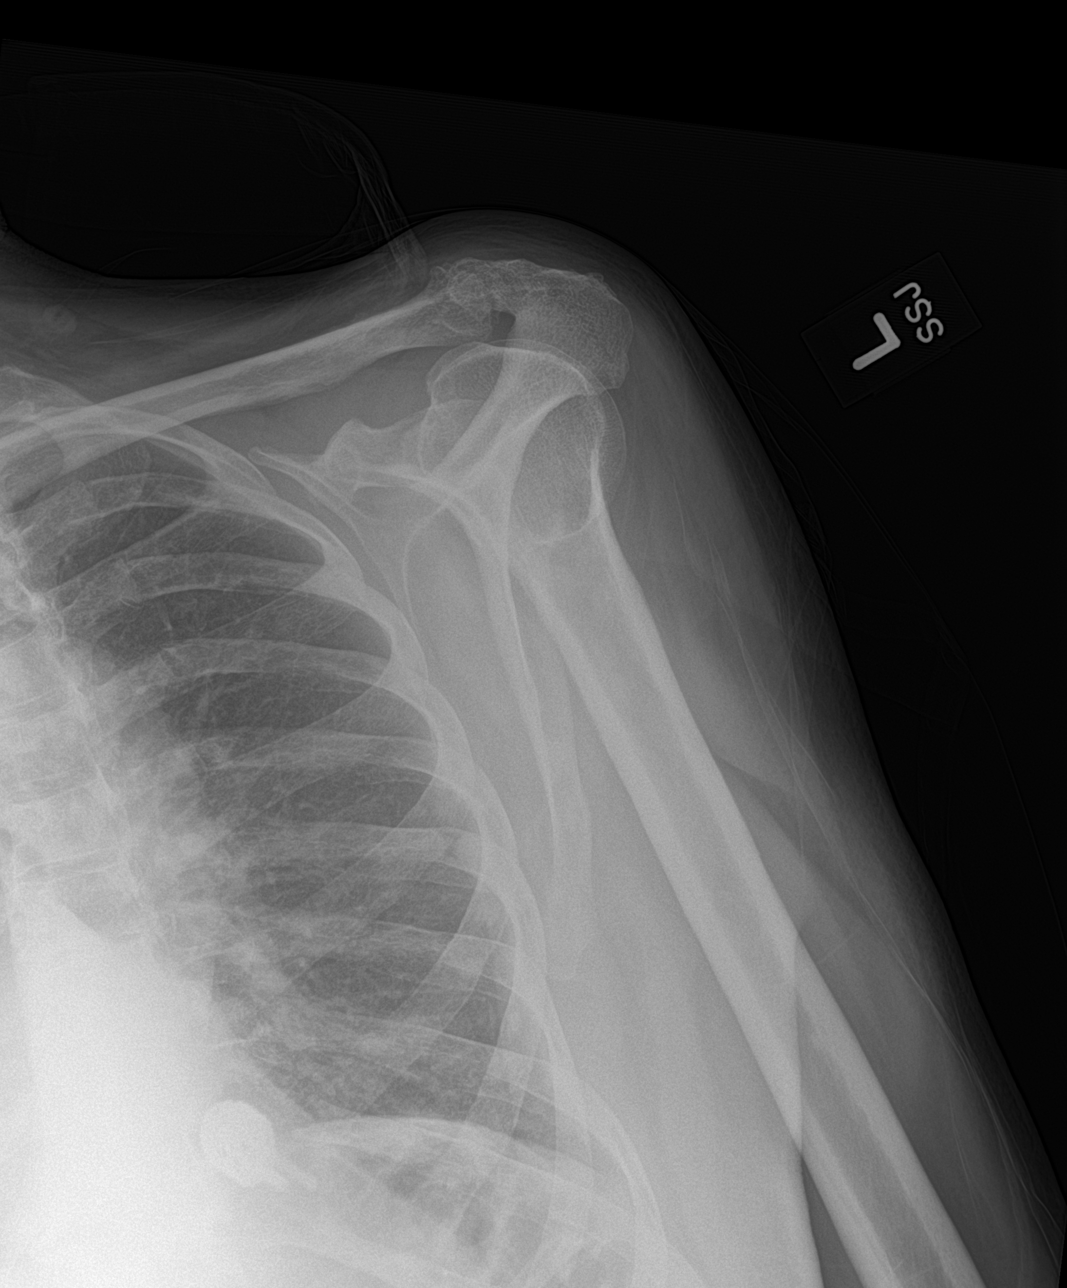

[3 of 3 positions shown; findings below may reference images not displayed]

FINDINGS: There is no evidence of fracture or dislocation. Mild to moderate
severity degenerative changes are seen involving the left
acromioclavicular joint. Soft tissues are unremarkable.
IMPRESSION: Degenerative changes without an acute osseous abnormality.

## 2022-02-16 ENCOUNTER — Other Ambulatory Visit (HOSPITAL_COMMUNITY): Payer: Self-pay

## 2022-02-26 ENCOUNTER — Ambulatory Visit
Admission: RE | Admit: 2022-02-26 | Discharge: 2022-02-26 | Disposition: A | Discharge status: Home / Self Care | Payer: Medicare Other | Source: Ambulatory Visit | Attending: Internal Medicine | Admitting: Internal Medicine

## 2022-02-26 DIAGNOSIS — R918 Other nonspecific abnormal finding of lung field: Secondary | ICD-10-CM | POA: Diagnosis present

## 2022-02-26 DIAGNOSIS — C61 Malignant neoplasm of prostate: Secondary | ICD-10-CM | POA: Insufficient documentation

## 2022-02-26 MED ORDER — IOHEXOL 300 MG/ML  SOLN
75.0000 mL | Freq: Once | INTRAMUSCULAR | Status: AC | PRN
  Administered 2022-02-26: 75 mL via INTRAVENOUS

## 2022-03-11 ENCOUNTER — Telehealth: Payer: Self-pay

## 2022-03-11 NOTE — Telephone Encounter (Signed)
Oral Oncology Patient Advocate Encounter  Was successful in securing patient a $8,000 grant from [employer] to provide copayment coverage for [place].  This will keep the out of pocket expense at $0.     [employer] ID: [id number]   The billing information is as follows and has been shared with [facility].    RxBin: [id number] PCN: [id number] Member ID: [id number] Group ID: [id number] Dates of Eligibility: 02/09/22 through 02/09/23  Fund:  Prostate Cancer - Medicare [place], [facility] Patient [place]  [phone] (phone) [phone] (fax) 03/11/2022 3:36 PM

## 2022-03-15 ENCOUNTER — Other Ambulatory Visit (HOSPITAL_COMMUNITY): Payer: Self-pay

## 2022-03-16 ENCOUNTER — Encounter: Payer: Self-pay | Admitting: Internal Medicine

## 2022-03-16 ENCOUNTER — Other Ambulatory Visit (HOSPITAL_COMMUNITY): Payer: Self-pay

## 2022-04-22 ENCOUNTER — Other Ambulatory Visit (HOSPITAL_COMMUNITY): Payer: Self-pay

## 2022-04-28 ENCOUNTER — Inpatient Hospital Stay: Payer: Medicare Other | Attending: Internal Medicine

## 2022-04-28 ENCOUNTER — Encounter: Payer: Self-pay | Admitting: Internal Medicine

## 2022-04-28 ENCOUNTER — Inpatient Hospital Stay (HOSPITAL_BASED_OUTPATIENT_CLINIC_OR_DEPARTMENT_OTHER): Payer: Medicare Other | Admitting: Internal Medicine

## 2022-04-28 VITALS — BP 128/76 | HR 78 | Temp 97.8°F | Resp 18 | Wt 215.0 lb

## 2022-04-28 DIAGNOSIS — C7800 Secondary malignant neoplasm of unspecified lung: Secondary | ICD-10-CM | POA: Insufficient documentation

## 2022-04-28 DIAGNOSIS — Z7982 Long term (current) use of aspirin: Secondary | ICD-10-CM | POA: Diagnosis not present

## 2022-04-28 DIAGNOSIS — I10 Essential (primary) hypertension: Secondary | ICD-10-CM | POA: Insufficient documentation

## 2022-04-28 DIAGNOSIS — Z79899 Other long term (current) drug therapy: Secondary | ICD-10-CM | POA: Diagnosis not present

## 2022-04-28 DIAGNOSIS — F17210 Nicotine dependence, cigarettes, uncomplicated: Secondary | ICD-10-CM | POA: Insufficient documentation

## 2022-04-28 DIAGNOSIS — C61 Malignant neoplasm of prostate: Secondary | ICD-10-CM | POA: Insufficient documentation

## 2022-04-28 DIAGNOSIS — Z7984 Long term (current) use of oral hypoglycemic drugs: Secondary | ICD-10-CM | POA: Insufficient documentation

## 2022-04-28 DIAGNOSIS — E119 Type 2 diabetes mellitus without complications: Secondary | ICD-10-CM | POA: Insufficient documentation

## 2022-04-28 DIAGNOSIS — C7951 Secondary malignant neoplasm of bone: Secondary | ICD-10-CM | POA: Insufficient documentation

## 2022-04-28 LAB — CBC WITH DIFFERENTIAL/PLATELET
Abs Immature Granulocytes: 0.02 10*3/uL (ref 0.00–0.07)
Basophils Absolute: 0.1 10*3/uL (ref 0.0–0.1)
Basophils Relative: 1 %
Eosinophils Absolute: 0.2 10*3/uL (ref 0.0–0.5)
Eosinophils Relative: 2 %
HCT: 38.1 % — ABNORMAL LOW (ref 39.0–52.0)
Hemoglobin: 12.4 g/dL — ABNORMAL LOW (ref 13.0–17.0)
Immature Granulocytes: 0 %
Lymphocytes Relative: 43 %
Lymphs Abs: 4.1 10*3/uL — ABNORMAL HIGH (ref 0.7–4.0)
MCH: 28.1 pg (ref 26.0–34.0)
MCHC: 32.5 g/dL (ref 30.0–36.0)
MCV: 86.4 fL (ref 80.0–100.0)
Monocytes Absolute: 0.6 10*3/uL (ref 0.1–1.0)
Monocytes Relative: 7 %
Neutro Abs: 4.6 10*3/uL (ref 1.7–7.7)
Neutrophils Relative %: 47 %
Platelets: 357 10*3/uL (ref 150–400)
RBC: 4.41 MIL/uL (ref 4.22–5.81)
RDW: 13.5 % (ref 11.5–15.5)
WBC: 9.6 10*3/uL (ref 4.0–10.5)
nRBC: 0 % (ref 0.0–0.2)

## 2022-04-28 LAB — PSA: Prostatic Specific Antigen: 0.78 ng/mL (ref 0.00–4.00)

## 2022-04-28 LAB — COMPREHENSIVE METABOLIC PANEL
ALT: 9 U/L (ref 0–44)
AST: 13 U/L — ABNORMAL LOW (ref 15–41)
Albumin: 3.5 g/dL (ref 3.5–5.0)
Alkaline Phosphatase: 102 U/L (ref 38–126)
Anion gap: 10 (ref 5–15)
BUN: 14 mg/dL (ref 8–23)
CO2: 22 mmol/L (ref 22–32)
Calcium: 9.5 mg/dL (ref 8.9–10.3)
Chloride: 102 mmol/L (ref 98–111)
Creatinine, Ser: 1.1 mg/dL (ref 0.61–1.24)
GFR, Estimated: >60 mL/min (ref >60)
Glucose, Bld: 248 mg/dL — ABNORMAL HIGH (ref 70–99)
Potassium: 4 mmol/L (ref 3.5–5.1)
Sodium: 134 mmol/L — ABNORMAL LOW (ref 135–145)
Total Bilirubin: 0.4 mg/dL (ref 0.3–1.2)
Total Protein: 6.7 g/dL (ref 6.5–8.1)

## 2022-04-28 NOTE — Assessment & Plan Note (Signed)
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 Monitor for now.   # continue X-tandi 120 mg/day-  Continue vitamin D.  Stable.   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 [place] 2023- The mixed cystic and solid lesion peripherally in the right upper lobe is unchanged in overall size and appearance from 07/17/2021. uspicious for a low-grade bronchogenic adenocarcinoma.would follow up CT in  July-2024.     # Chest tenderness-grade 1-2. Sec to ADT- continue tramadol prn; add vaseline BID- stable.   * eliagrd 45 in nov 21st 2024  # DISPOSITION:  # Follow up in  3 month - MD-;labs- cbc/cmp/PSA;ELIGARD-- Dr.B

## 2022-04-28 NOTE — Progress Notes (Signed)
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   # NOV 2021- CTA [ER] Diffuse bilateral pulmonary nodules including a cavitary lesion in the right upper lobe measuring approximately 2 cm. There is extensive nodular pleural thickening bilaterally, greatest in the left lower lobe.  November 2021 PET scan-multiple lung lesions multiple bone lesions and prostate uptake.  February 2022-prostate biopsy [Dr.Brandon]   # DM-2; NO COPD [?]; ACTIVE SMOKER 65ppd.  # DEC 2nd week- FIRMAGON; # JAN 12TH, 2022-ZYTIGA  1000 MG+ PRED 5MG; FEB MID 2022- ELIGARD q6M; MARCH 2022- DISCONTINUE ZYTIGA sec to PRESS [severe hypokalemia/mental status changes hospitalization];  #   # MAY 6th, 2022- XTnadi 3pills/day   # NGS/MOLECULAR TESTS: MARCH 2022-foundation 1 no targetable mutation./Genetic evaluation negative    # PALLIATIVE CARE EVALUATION:  # PAIN MANAGEMENT:    DIAGNOSIS: Prostate cancer  STAGE:   IV      ;  GOALS: Palliative  CURRENT/MOST RECENT THERAPY : ADT+ Xtandi    Prostate cancer metastatic to multiple sites [facility])  02/04/2020 Initial Diagnosis   Prostate cancer metastatic to multiple sites [facility])   03/19/2020 Cancer Staging   Staging form: Prostate, AJCC 8th Edition - Clinical: Stage IVB (pM1b) - Signed by Brent Sickle, MD on 03/19/2020    Genetic Testing   Negative genetic testing. No pathogenic variants identified on the [facility] Multi-Cancer Panel. VUS in AXIN2 called c.1235A>C identified. The report date is 03/23/2020.   The Multi-Cancer Panel offered by [facility] includes sequencing and/or deletion duplication testing of the  following 84 genes: AIP, ALK, APC, ATM, AXIN2,BAP1,  BARD1, BLM, BMPR1A, BRCA1, BRCA2, BRIP1, CASR, CDC73, CDH1, CDK4, CDKN1B, CDKN1C, CDKN2A (p14ARF), CDKN2A (p16INK4a), CEBPA, CHEK2, CTNNA1, DICER1, DIS3L2, EGFR (c.2369C>T, p.Thr790Met variant only), EPCAM (Deletion/duplication testing only), FH, FLCN, GATA2, GPC3, GREM1 (Promoter region deletion/duplication testing only), HOXB13 (c.251G>A, p.Gly84Glu), HRAS, KIT, MAX, MEN1, MET, MITF (c.952G>A, p.Glu318Lys variant only), MLH1, MSH2, MSH3, MSH6, MUTYH, NBN, NF1, NF2, NTHL1, PALB2, PDGFRA, PHOX2B, PMS2, POLD1, POLE, POT1, PRKAR1A, PTCH1, PTEN, RAD50, RAD51C, RAD51D, RB1, RECQL4, RET, RUNX1, SDHAF2, SDHA (sequence changes only), SDHB, SDHC, SDHD, SMAD4, SMARCA4, SMARCB1, SMARCE1, STK11, SUFU, TERC, TERT, TMEM127, TP53, TSC1, TSC2, VHL, WRN and WT1.      HISTORY OF PRESENTING ILLNESS: Ambulating independently. alone  Brent Glass 82 y.o.  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  No falls.  Denies any worsening bone pain or joint pains.  He states that he is compliant with his oral Xtandi.   No fever no chills.  No constipation no diarrhea.  No nausea no vomiting.  Review of Systems  Constitutional:  Positive for malaise/fatigue. Negative for chills, diaphoresis and fever.  HENT:  Negative for nosebleeds and sore throat.   Eyes:  Negative for double vision.  Respiratory:  Negative for hemoptysis, sputum production, shortness of breath and wheezing.   Cardiovascular:  Negative for chest pain, palpitations, orthopnea and leg swelling.  Gastrointestinal:  Positive for abdominal pain. Negative for blood in stool, constipation, diarrhea, heartburn, melena, nausea and vomiting.  Genitourinary:  Negative for dysuria, frequency and urgency.  Musculoskeletal:  Positive  for back pain and myalgias. Negative for joint pain.  Skin: Negative.  Negative for  itching and rash.  Neurological:  Negative for dizziness, tingling, focal weakness, weakness and headaches.  Endo/Heme/Allergies:  Does not bruise/bleed easily.  Psychiatric/Behavioral:  Negative for depression. The patient is not nervous/anxious and does not have insomnia.      MEDICAL HISTORY:  Past Medical History:  Diagnosis Date   Arthritis    BPH (benign prostatic hyperplasia)    BPH with obstruction/lower urinary tract symptoms    Diabetes (HCC)    Disorder resulting from impaired renal function    Elevated PSA    Family history of lung cancer    Family history of lymphoma    Heart murmur    Hypertension    Knee pain    Lesion of lung    Low HDL (under 40)    Obesity    Urinary hesitancy     SURGICAL HISTORY: Past Surgical History:  Procedure Laterality Date   HERNIA REPAIR  0000000   Umbilical Hernia Repair   KNEE ARTHROSCOPY Left 04/10/2015   Procedure: ARTHROSCOPY KNEE, partial medial meniscectomy, partial synovectomy;  Surgeon: Brent Knows, MD;  Location: [facility] ORS;  Service: Orthopedics;  Laterality: Left;    SOCIAL HISTORY: Social History   Socioeconomic History   Marital status: Widowed    Spouse name: Not on file   Number of children: Not on file   Years of education: Not on file   Highest education level: Not on file  Occupational History   Not on file  Tobacco Use   Smoking status: Every Day    Packs/day: 1.00    Types: Cigarettes   Smokeless tobacco: Never  Vaping Use   Vaping Use: Never used  Substance and Sexual Activity   Alcohol use: No    Alcohol/week: 0.0 standard drinks of alcohol   Drug use: No   Sexual activity: Not Currently  Other Topics Concern   Not on file  Social History Narrative   Lives in [place]; self; daughter lives 1 mile. Smoke 1ppd > 65 years; stopped 25 years ago.  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    Social Determinants of Health   Financial Resource Strain: Not on file  Food  Insecurity: Not on file  Transportation Needs: Not on file  Physical Activity: Not on file  Stress: Not on file  Social Connections: Not on file  Intimate Partner Violence: Not on file    FAMILY HISTORY: Family History  Problem Relation Age of Onset   Diabetes Mother    Lung cancer Mother    Lymphoma Son 28       died in 44.    Kidney disease Neg Hx    Prostate cancer Neg Hx     ALLERGIES:  has No Known Allergies.  MEDICATIONS:  Current Outpatient Medications  Medication Sig Dispense Refill   acetaminophen (TYLENOL) 650 MG CR tablet Take 650 mg by mouth every 8 (eight) hours as needed for pain.     albuterol (PROVENTIL HFA;VENTOLIN HFA) 108 (90 BASE) MCG/ACT inhaler Inhale 2 puffs into the lungs every 6 (six) hours as needed for wheezing or shortness of breath.     aspirin 81 MG tablet Take 81 mg by mouth daily.     cyanocobalamin 2000 MCG tablet Take 1 tablet (2,000 mcg total) by mouth daily. 90 tablet 1   enzalutamide (XTANDI) 40 MG tablet Take 3 tablets (120 mg total) by mouth daily. [place]  tablet 3   finasteride (PROSCAR) 5 MG tablet TAKE 1 TABLET BY MOUTH EVERY DAY (Patient taking differently: Take 5 mg by mouth daily.) 90 tablet 3   metFORMIN (GLUCOPHAGE) 1000 MG tablet Take 1,000 mg by mouth 2 (two) times daily with a meal.     rosuvastatin (CRESTOR) 20 MG tablet Take 20 mg by mouth every morning.     thiamine 100 MG tablet TAKE 1 TABLET BY MOUTH EVERY DAY 90 tablet 1   furosemide (LASIX) 20 MG tablet Take 1 tablet (20 mg total) by mouth daily. (Patient not taking: Reported on 04/28/2022) 15 tablet 0   GLUMETZA 1000 MG 24 hr tablet Take 1,000 mg by mouth 2 (two) times daily. (Patient not taking: Reported on 04/28/2022)     simvastatin (ZOCOR) 40 MG tablet Take 40 mg by mouth daily. (Patient not taking: Reported on 04/28/2022)     No current facility-administered medications for this visit.      Marland Kitchen  PHYSICAL EXAMINATION: ECOG PERFORMANCE STATUS: 0 - Asymptomatic  Vitals:    04/28/22 1400  BP: 128/76  Pulse: 78  Resp: 18  Temp: 97.8 F (36.6 C)     Filed Weights   04/28/22 1400  Weight: 215 lb (97.5 kg)     Physical Exam HENT:     Head: Normocephalic and atraumatic.     Mouth/Throat:     Pharynx: No oropharyngeal exudate.  Eyes:     Pupils: Pupils are equal, round, and reactive to light.  Cardiovascular:     Rate and Rhythm: Normal rate and regular rhythm.  Pulmonary:     Effort: No respiratory distress.     Breath sounds: No wheezing.     Comments: Decreased breath sounds bilaterally. Abdominal:     General: Bowel sounds are normal. There is no distension.     Palpations: Abdomen is soft. There is no mass.     Tenderness: There is no abdominal tenderness. There is no guarding or rebound.  Musculoskeletal:        General: No tenderness. Normal range of motion.     Cervical back: Normal range of motion and neck supple.  Skin:    General: Skin is warm.  Neurological:     Mental Status: He is alert and oriented to person, place, and time.  Psychiatric:        Mood and Affect: Affect normal.      LABORATORY DATA:  I have reviewed the data as listed Lab Results  Component Value Date   WBC 9.6 04/28/2022   HGB 12.4 (L) 04/28/2022   HCT 38.1 (L) 04/28/2022   MCV 86.4 04/28/2022   PLT 357 04/28/2022   Recent Labs    10/26/21 1401 01/26/22 1250 04/28/22 1434  NA 134* 132* 134*  K 3.9 3.8 4.0  CL 102 103 102  CO2 24 23 22  $ GLUCOSE 279* 323* 248*  BUN 18 15 14  $ CREATININE 1.17 1.20 1.10  CALCIUM 9.6 9.6 9.5  GFRNONAA >60 >60 >60  PROT 7.1 6.9 6.7  ALBUMIN 3.7 3.6 3.5  AST 16 12* 13*  ALT 9 8 9  $ ALKPHOS 98 123 102  BILITOT 0.6 0.3 0.4    Latest Reference Range & Units 01/23/20 12:22 03/10/20 14:11 03/19/20 09:19 04/16/20 09:10 05/14/20 10:06 06/13/20 09:26 07/11/20 10:13 08/07/20 09:24 09/04/20 10:13 09/16/20 13:59 10/21/20 13:59 12/23/20 13:27  Prostatic Specific Antigen 0.00 - 4.00 ng/mL 986.00 (H) 34.69 (H) 21.59 (H)  2.68 1.21 0.71 0.68 0.41 0.36 0.28  0.29 0.28  (H): Data is abnormally high  RADIOGRAPHIC STUDIES: I have personally reviewed the radiological images as listed and agreed with the findings in the report. No results found.  ASSESSMENT & PLAN:   Prostate cancer metastatic to multiple sites [facility]) # prostate cancer castrate sensitive metastatic to lung/bone [NOV PSA 900-NOV 2021]; On Eligard;   PSA significantly improved-stable-NOV 2023- 0.59; slowly rising. Monitor for now.   # continue X-tandi 120 mg/day-  Continue vitamin D.  Stable.   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 [place] 2023- The mixed cystic and solid lesion peripherally in the right upper lobe is unchanged in overall size and appearance from 07/17/2021. uspicious for a low-grade bronchogenic adenocarcinoma.would follow up CT in  July-2024.     # Chest tenderness-grade 1-2. Sec to ADT- continue tramadol prn; add vaseline BID- stable.   * eliagrd 45 in nov 21st 2024  # DISPOSITION:  # Follow up in  3 month - MD-;labs- cbc/cmp/PSA;ELIGARD-- Dr.B     All questions were answered. The patient Glass to call the clinic with any problems, questions or concerns.    Brent Sickle, MD 04/28/2022 3:46 PM

## 2022-04-28 NOTE — Progress Notes (Signed)
Patient reports stable chest pains that has been discussed with Dr. B previously.

## 2022-05-26 ENCOUNTER — Other Ambulatory Visit: Payer: Self-pay | Admitting: *Deleted

## 2022-05-26 ENCOUNTER — Telehealth: Payer: Self-pay

## 2022-05-26 DIAGNOSIS — C61 Malignant neoplasm of prostate: Secondary | ICD-10-CM

## 2022-05-26 NOTE — Telephone Encounter (Addendum)
Received notification that patient needed refill of Xtandi. Upon looking back over notes, patient initially approved for [employer] through 03/07/22. I was able to obtain two grants for patient to cover co-pay for 2024. [employer] auto re-enrolled patient for the year 2024 and will be receiving their medication from [facility] via [employer].   I spoke with the patient and they know to call [facility] for refills and to call me at [phone] with any questions or concerns receiving medication.   [place] [place]  [phone] (phone) [phone] (fax) 05/26/2022 2:48 PM

## 2022-05-26 NOTE — Telephone Encounter (Signed)
Patient has refills on hand at at [facility]. Ben patient advocate will call patient to let him know where to call for refills.

## 2022-07-08 ENCOUNTER — Other Ambulatory Visit: Payer: Self-pay | Admitting: *Deleted

## 2022-07-08 DIAGNOSIS — C61 Malignant neoplasm of prostate: Secondary | ICD-10-CM

## 2022-07-08 NOTE — Telephone Encounter (Signed)
Component Ref Range & Units 2 mo ago (04/28/22) 5 mo ago (01/26/22) 8 mo ago (10/26/21) 11 mo ago (07/24/21) 1 yr ago (06/24/21) 1 yr ago (05/23/21) 1 yr ago (03/13/21)  WBC 4.0 - 10.5 K/uL 9.6 8.3 10.1 9.8 9.9 10.5 10.6 High   RBC 4.22 - 5.81 MIL/uL 4.41 4.35 4.39 4.75 4.50 4.56 4.39  Hemoglobin 13.0 - 17.0 g/dL 16.1 Low  09.6 Low  04.5 Low  13.4 12.6 Low  12.7 Low  12.6 Low   HCT 39.0 - 52.0 % 38.1 Low  37.9 Low  38.2 Low  41.4 40.3 40.2 38.5 Low   MCV 80.0 - 100.0 fL 86.4 87.1 87.0 87.2 89.6 88.2 87.7  MCH 26.0 - 34.0 pg 28.1 28.5 28.5 28.2 28.0 27.9 28.7  MCHC 30.0 - 36.0 g/dL 40.9 81.1 91.4 78.2 95.6 31.6 32.7  RDW 11.5 - 15.5 % 13.5 13.5 14.1 14.3 14.6 14.7 13.9  Platelets 150 - 400 K/uL 357 324 337 357 338 342 393  nRBC 0.0 - 0.2 % 0.0 0.0 0.0 0.0 0.0 0.0 CM 0.0  Neutrophils Relative % % 47 50 46 51 51  51  Neutro Abs 1.7 - 7.7 K/uL 4.6 4.2 4.6 5.1 5.1  5.5  Lymphocytes Relative % 43 39 43 38 37  40  Lymphs Abs 0.7 - 4.0 K/uL 4.1 High  3.2 4.3 High  3.7 3.7  4.2 High   Monocytes Relative % 7 8 7 8 8  6   Monocytes Absolute 0.1 - 1.0 K/uL 0.6 0.6 0.7 0.8 0.8  0.6  Eosinophils Relative % 2 2 2 2 3  2   Eosinophils Absolute 0.0 - 0.5 K/uL 0.2 0.2 0.2 0.2 0.3  0.2  Basophils Relative % 1 1 1 1 1  1   Basophils Absolute 0.0 - 0.1 K/uL 0.1 0.1 0.1 0.1 0.1  0.1  Immature Granulocytes % 0 0 1 0 0  0  Abs Immature Granulocytes 0.00 - 0.07 K/uL 0.02 0.03 CM 0.10 High  CM 0.01 CM 0.03 CM  0.02 CM  Comment: Performed at [facility], [address] [place], [place] [address]  Resulting Agency [facility] CLIN LAB CH CLIN LAB CH CLIN LAB CH CLIN LAB CH CLIN LAB CH CLIN LAB CH CLIN LAB         Specimen Collected: 04/28/22 14:34 Last Resulted: 04/28/22 14:54       Component Ref Range & Units 2 mo ago (04/28/22) 5 mo ago (01/26/22) 8 mo ago (10/26/21) 11 mo ago (07/24/21) 1 yr ago (06/24/21) 1 yr ago (05/23/21) 1 yr ago (03/13/21)  Sodium 135 - 145 mmol/L 134 Low  132  Low  134 Low  136 137 138 135  Potassium 3.5 - 5.1 mmol/L 4.0 3.8 3.9 3.9 4.1 4.0 4.0  Chloride 98 - 111 mmol/L 102 103 102 103 103 105 105  CO2 22 - 32 mmol/L 22 23 24 25 27 25 23   Glucose, Bld 70 - 99 mg/dL 657 High  846 High  CM 279 High  CM 109 High  CM 140 High  CM 165 High  CM 110 High  CM  Comment: Glucose reference range applies only to samples taken after fasting for at least 8 hours.  BUN 8 - 23 mg/dL 14 15 18 19 10 12 18   Creatinine, Ser 0.61 - 1.24 mg/dL 9.62 9.52 8.41 3.24 4.01 1.05 0.83  Calcium 8.9 - 10.3 mg/dL 9.5 9.6 9.6 9.6 9.7 9.7 9.9  Total Protein 6.5 - 8.1  g/dL 6.7 6.9 7.1 7.7 7.0  7.6  Albumin 3.5 - 5.0 g/dL 3.5 3.6 3.7 4.1 3.8  3.8  AST 15 - 41 U/L 13 Low  12 Low  16 12 Low  14 Low   9 Low   ALT 0 - 44 U/L 9 8 9 7 10  7   Alkaline Phosphatase 38 - 126 U/L 102 123 98 103 89  101  Total Bilirubin 0.3 - 1.2 mg/dL 0.4 0.3 0.6 0.4 0.4  0.7  GFR, Estimated >60 mL/min >60 >60 CM >60 CM >60 CM >60 CM >60 CM >60 CM  Comment: (NOTE) Calculated using the CKD-EPI Creatinine Equation (2021)  Anion gap 5 - 15 10 6  CM 8 CM 8 CM 7 CM 8 CM 7 CM  Comment: Performed at [facility], [address] [place], [place] [address]  Resulting Agency [facility] CLIN LAB CH CLIN LAB CH CLIN LAB CH CLIN LAB CH CLIN LAB CH CLIN LAB CH CLIN LAB         Specimen Collected: 04/28/22 14:34 Last Resulted: 04/28/22                 Component Ref Range & Units 2 mo ago (04/28/22) 5 mo ago (01/26/22) 8 mo ago (10/26/21) 11 mo ago (07/24/21) 1 yr ago (06/24/21) 1 yr ago (12/23/20) 1 yr ago (10/21/20)  Prostatic Specific Antigen 0.00 - 4.00 ng/mL 0.78 0.59 CM 0.36 CM 0.32 CM 0.43 CM 0.28 CM 0.29 CM

## 2022-07-09 ENCOUNTER — Encounter: Payer: Self-pay | Admitting: Internal Medicine

## 2022-07-09 MED ORDER — ENZALUTAMIDE 40 MG PO TABS: 120.0000 mg | ORAL_TABLET | Freq: Every day | ORAL | 3 refills | Status: DC

## 2022-07-27 ENCOUNTER — Encounter: Payer: Self-pay | Admitting: Internal Medicine

## 2022-07-27 ENCOUNTER — Inpatient Hospital Stay: Payer: Medicare Other | Attending: Internal Medicine

## 2022-07-27 ENCOUNTER — Inpatient Hospital Stay (HOSPITAL_BASED_OUTPATIENT_CLINIC_OR_DEPARTMENT_OTHER): Payer: Medicare Other | Admitting: Internal Medicine

## 2022-07-27 ENCOUNTER — Inpatient Hospital Stay: Payer: Medicare Other

## 2022-07-27 VITALS — BP 144/72 | HR 76 | Temp 98.6°F | Resp 20 | Wt 215.5 lb

## 2022-07-27 DIAGNOSIS — C7951 Secondary malignant neoplasm of bone: Secondary | ICD-10-CM | POA: Diagnosis present

## 2022-07-27 DIAGNOSIS — C61 Malignant neoplasm of prostate: Secondary | ICD-10-CM | POA: Insufficient documentation

## 2022-07-27 DIAGNOSIS — Z807 Family history of other malignant neoplasms of lymphoid, hematopoietic and related tissues: Secondary | ICD-10-CM | POA: Diagnosis not present

## 2022-07-27 DIAGNOSIS — E119 Type 2 diabetes mellitus without complications: Secondary | ICD-10-CM | POA: Diagnosis not present

## 2022-07-27 DIAGNOSIS — C7800 Secondary malignant neoplasm of unspecified lung: Secondary | ICD-10-CM | POA: Insufficient documentation

## 2022-07-27 DIAGNOSIS — I10 Essential (primary) hypertension: Secondary | ICD-10-CM | POA: Diagnosis not present

## 2022-07-27 DIAGNOSIS — Z801 Family history of malignant neoplasm of trachea, bronchus and lung: Secondary | ICD-10-CM | POA: Diagnosis not present

## 2022-07-27 DIAGNOSIS — Z7982 Long term (current) use of aspirin: Secondary | ICD-10-CM | POA: Insufficient documentation

## 2022-07-27 DIAGNOSIS — R918 Other nonspecific abnormal finding of lung field: Secondary | ICD-10-CM | POA: Diagnosis not present

## 2022-07-27 DIAGNOSIS — Z79899 Other long term (current) drug therapy: Secondary | ICD-10-CM | POA: Diagnosis not present

## 2022-07-27 DIAGNOSIS — Z7984 Long term (current) use of oral hypoglycemic drugs: Secondary | ICD-10-CM | POA: Diagnosis not present

## 2022-07-27 DIAGNOSIS — F17210 Nicotine dependence, cigarettes, uncomplicated: Secondary | ICD-10-CM | POA: Insufficient documentation

## 2022-07-27 DIAGNOSIS — Z79818 Long term (current) use of other agents affecting estrogen receptors and estrogen levels: Secondary | ICD-10-CM | POA: Diagnosis not present

## 2022-07-27 LAB — CMP (CANCER CENTER ONLY)
ALT: 7 U/L (ref 0–44)
AST: 13 U/L — ABNORMAL LOW (ref 15–41)
Albumin: 3.8 g/dL (ref 3.5–5.0)
Alkaline Phosphatase: 110 U/L (ref 38–126)
Anion gap: 11 (ref 5–15)
BUN: 16 mg/dL (ref 8–23)
CO2: 22 mmol/L (ref 22–32)
Calcium: 9.4 mg/dL (ref 8.9–10.3)
Chloride: 102 mmol/L (ref 98–111)
Creatinine: 1.12 mg/dL (ref 0.61–1.24)
GFR, Estimated: >60 mL/min (ref >60)
Glucose, Bld: 124 mg/dL — ABNORMAL HIGH (ref 70–99)
Potassium: 3.9 mmol/L (ref 3.5–5.1)
Sodium: 135 mmol/L (ref 135–145)
Total Bilirubin: 0.4 mg/dL (ref 0.3–1.2)
Total Protein: 7.2 g/dL (ref 6.5–8.1)

## 2022-07-27 LAB — CBC WITH DIFFERENTIAL (CANCER CENTER ONLY)
Abs Immature Granulocytes: 0.04 10*3/uL (ref 0.00–0.07)
Basophils Absolute: 0.1 10*3/uL (ref 0.0–0.1)
Basophils Relative: 1 %
Eosinophils Absolute: 0.2 10*3/uL (ref 0.0–0.5)
Eosinophils Relative: 2 %
HCT: 39.4 % (ref 39.0–52.0)
Hemoglobin: 12.9 g/dL — ABNORMAL LOW (ref 13.0–17.0)
Immature Granulocytes: 0 %
Lymphocytes Relative: 40 %
Lymphs Abs: 4.2 10*3/uL — ABNORMAL HIGH (ref 0.7–4.0)
MCH: 28.4 pg (ref 26.0–34.0)
MCHC: 32.7 g/dL (ref 30.0–36.0)
MCV: 86.8 fL (ref 80.0–100.0)
Monocytes Absolute: 0.8 10*3/uL (ref 0.1–1.0)
Monocytes Relative: 7 %
Neutro Abs: 5.2 10*3/uL (ref 1.7–7.7)
Neutrophils Relative %: 50 %
Platelet Count: 368 10*3/uL (ref 150–400)
RBC: 4.54 MIL/uL (ref 4.22–5.81)
RDW: 14.2 % (ref 11.5–15.5)
WBC Count: 10.5 10*3/uL (ref 4.0–10.5)
nRBC: 0 % (ref 0.0–0.2)

## 2022-07-27 LAB — PSA: Prostatic Specific Antigen: 1.15 ng/mL (ref 0.00–4.00)

## 2022-07-27 MED ORDER — LEUPROLIDE ACETATE (6 MONTH) 45 MG ~~LOC~~ KIT
45.0000 mg | PACK | Freq: Once | SUBCUTANEOUS | Status: AC
  Administered 2022-07-27: 45 mg via SUBCUTANEOUS

## 2022-07-27 NOTE — Assessment & Plan Note (Addendum)
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 Monitor for now- stable.  # continue X-tandi 120 mg/day-  Continue vitamin D.  Stable.   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 DEC 2023- The mixed cystic and solid lesion peripherally in the right upper lobe is unchanged in overall size and appearance from 07/17/2021. Suspicious for a low-grade bronchogenic adenocarcinoma.would follow up CT in  July-2024; will order CT scan today. stable.      # Chest tenderness-grade 1-2. Sec to ADT- continue tramadol prn; add vaseline BID- stable.   * eliagrd 45 07/27/2022.   # DISPOSITION:  # Eligard today # Follow up in 2  month - MD-;labs- cbc/cmp/PSA;CT chest prior- - Dr.B

## 2022-07-27 NOTE — Progress Notes (Signed)
Patient has no concerns 

## 2022-07-27 NOTE — Progress Notes (Signed)
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   # NOV 2021- CTA [ER] Diffuse bilateral pulmonary nodules including a cavitary lesion in the right upper lobe measuring approximately 2 cm. There is extensive nodular pleural thickening bilaterally, greatest in the left lower lobe.  November 2021 PET scan-multiple lung lesions multiple bone lesions and prostate uptake.  February 2022-prostate biopsy [Dr.Brandon]   # DM-2; NO COPD [?]; ACTIVE SMOKER 65ppd.  # DEC 2nd week- FIRMAGON; # JAN 12TH, 2022-ZYTIGA  1000 MG+ PRED 5MG ; FEB MID 2022- ELIGARD q6M; MARCH 2022- DISCONTINUE ZYTIGA sec to PRESS [severe hypokalemia/mental status changes hospitalization];  #   # MAY 6th, 2022- XTnadi 3pills/day   # NGS/MOLECULAR TESTS: MARCH 2022-foundation 1 no targetable mutation./Genetic evaluation negative    # PALLIATIVE CARE EVALUATION:  # PAIN MANAGEMENT:    DIAGNOSIS: Prostate cancer  STAGE:   IV      ;  GOALS: Palliative  CURRENT/MOST RECENT THERAPY : ADT+ Xtandi    Prostate cancer metastatic to multiple sites [facility])  02/04/2020 Initial Diagnosis   Prostate cancer metastatic to multiple sites [facility])   03/19/2020 Cancer Staging   Staging form: Prostate, AJCC 8th Edition - Clinical: Stage IVB (pM1b) - Signed by Earna Coder, MD on 03/19/2020    Genetic Testing   Negative genetic testing. No pathogenic variants identified on the [facility] Multi-Cancer Panel. VUS in AXIN2 called c.1235A>C identified. The report date is 03/23/2020.   The Multi-Cancer Panel offered by [facility] includes sequencing and/or deletion duplication testing of the  following 84 genes: AIP, ALK, APC, ATM, AXIN2,BAP1,  BARD1, BLM, BMPR1A, BRCA1, BRCA2, BRIP1, CASR, CDC73, CDH1, CDK4, CDKN1B, CDKN1C, CDKN2A (p14ARF), CDKN2A (p16INK4a), CEBPA, CHEK2, CTNNA1, DICER1, DIS3L2, EGFR (c.2369C>T, p.Thr790Met variant only), EPCAM (Deletion/duplication testing only), FH, FLCN, GATA2, GPC3, GREM1 (Promoter region deletion/duplication testing only), HOXB13 (c.251G>A, p.Gly84Glu), HRAS, KIT, MAX, MEN1, MET, MITF (c.952G>A, p.Glu318Lys variant only), MLH1, MSH2, MSH3, MSH6, MUTYH, NBN, NF1, NF2, NTHL1, PALB2, PDGFRA, PHOX2B, PMS2, POLD1, POLE, POT1, PRKAR1A, PTCH1, PTEN, RAD50, RAD51C, RAD51D, RB1, RECQL4, RET, RUNX1, SDHAF2, SDHA (sequence changes only), SDHB, SDHC, SDHD, SMAD4, SMARCA4, SMARCB1, SMARCE1, STK11, SUFU, TERC, TERT, TMEM127, TP53, TSC1, TSC2, VHL, WRN and WT1.      HISTORY OF PRESENTING ILLNESS: Ambulating independently. alone  Brent Glass 82 y.o.  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  No falls.  Denies any worsening bone pain or joint pains.  Brent Glass states that Brent Glass is compliant with his oral Xtandi. No fever no chills.  No constipation no diarrhea.  No nausea no vomiting.  Review of Systems  Constitutional:  Positive for malaise/fatigue. Negative for chills, diaphoresis and fever.  HENT:  Negative for nosebleeds and sore throat.   Eyes:  Negative for double vision.  Respiratory:  Negative for hemoptysis, sputum production, shortness of breath and wheezing.   Cardiovascular:  Negative for chest pain, palpitations, orthopnea and leg swelling.  Gastrointestinal:  Positive for abdominal pain. Negative for blood in stool, constipation, diarrhea, heartburn, melena, nausea and vomiting.  Genitourinary:  Negative for dysuria, frequency and urgency.  Musculoskeletal:  Positive for back pain and myalgias. Negative for joint  pain.  Skin: Negative.  Negative for itching and rash.  Neurological:   Negative for dizziness, tingling, focal weakness, weakness and headaches.  Endo/Heme/Allergies:  Does not bruise/bleed easily.  Psychiatric/Behavioral:  Negative for depression. The patient is not nervous/anxious and does not have insomnia.      MEDICAL HISTORY:  Past Medical History:  Diagnosis Date   Arthritis    BPH (benign prostatic hyperplasia)    BPH with obstruction/lower urinary tract symptoms    Diabetes (HCC)    Disorder resulting from impaired renal function    Elevated PSA    Family history of lung cancer    Family history of lymphoma    Heart murmur    Hypertension    Knee pain    Lesion of lung    Low HDL (under 40)    Obesity    Urinary hesitancy     SURGICAL HISTORY: Past Surgical History:  Procedure Laterality Date   HERNIA REPAIR  2012   Umbilical Hernia Repair   KNEE ARTHROSCOPY Left 04/10/2015   Procedure: ARTHROSCOPY KNEE, partial medial meniscectomy, partial synovectomy;  Surgeon: Kennedy Bucker, MD;  Location: [facility] ORS;  Service: Orthopedics;  Laterality: Left;    SOCIAL HISTORY: Social History   Socioeconomic History   Marital status: Widowed    Spouse name: Not on file   Number of children: Not on file   Years of education: Not on file   Highest education level: Not on file  Occupational History   Not on file  Tobacco Use   Smoking status: Every Day    Packs/day: 1    Types: Cigarettes   Smokeless tobacco: Never  Vaping Use   Vaping Use: Never used  Substance and Sexual Activity   Alcohol use: No    Alcohol/week: 0.0 standard drinks of alcohol   Drug use: No   Sexual activity: Not Currently  Other Topics Concern   Not on file  Social History Narrative   Lives in [place]; self; daughter lives 1 mile. Smoke 1ppd > 65 years; stopped 25 years ago.  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    Social Determinants of Health   Financial Resource Strain: Not on file  Food Insecurity: Not on file  Transportation  Needs: Not on file  Physical Activity: Not on file  Stress: Not on file  Social Connections: Not on file  Intimate Partner Violence: Not on file    FAMILY HISTORY: Family History  Problem Relation Age of Onset   Diabetes Mother    Lung cancer Mother    Lymphoma Son 28       died in 39.    Kidney disease Neg Hx    Prostate cancer Neg Hx     ALLERGIES:  has No Known Allergies.  MEDICATIONS:  Current Outpatient Medications  Medication Sig Dispense Refill   acetaminophen (TYLENOL) 650 MG CR tablet Take 650 mg by mouth every 8 (eight) hours as needed for pain.     albuterol (PROVENTIL HFA;VENTOLIN HFA) 108 (90 BASE) MCG/ACT inhaler Inhale 2 puffs into the lungs every 6 (six) hours as needed for wheezing or shortness of breath.     aspirin 81 MG tablet Take 81 mg by mouth daily.     cyanocobalamin 2000 MCG tablet Take 1 tablet (2,000 mcg total) by mouth daily. 90 tablet 1   enzalutamide (XTANDI) 40 MG tablet Take 3 tablets (120 mg total) by mouth daily. 90 tablet 3   finasteride (PROSCAR) 5 MG  tablet TAKE 1 TABLET BY MOUTH EVERY DAY (Patient taking differently: Take 5 mg by mouth daily.) 90 tablet 3   metFORMIN (GLUCOPHAGE) 1000 MG tablet Take 1,000 mg by mouth 2 (two) times daily with a meal.     rosuvastatin (CRESTOR) 20 MG tablet Take 20 mg by mouth every morning.     thiamine 100 MG tablet TAKE 1 TABLET BY MOUTH EVERY DAY 90 tablet 1   furosemide (LASIX) 20 MG tablet Take 1 tablet (20 mg total) by mouth daily. (Patient not taking: Reported on 04/28/2022) 15 tablet 0   GLUMETZA 1000 MG 24 hr tablet Take 1,000 mg by mouth 2 (two) times daily. (Patient not taking: Reported on 04/28/2022)     simvastatin (ZOCOR) 40 MG tablet Take 40 mg by mouth daily. (Patient not taking: Reported on 04/28/2022)     No current facility-administered medications for this visit.      Marland Kitchen  PHYSICAL EXAMINATION: ECOG PERFORMANCE STATUS: 0 - Asymptomatic  Vitals:   07/27/22 1428  BP: (!) 144/72   Pulse: 76  Resp: 20  Temp: 98.6 F (37 C)  SpO2: 100%     Filed Weights   07/27/22 1428  Weight: 215 lb 8 oz (97.8 kg)     Physical Exam HENT:     Head: Normocephalic and atraumatic.     Mouth/Throat:     Pharynx: No oropharyngeal exudate.  Eyes:     Pupils: Pupils are equal, round, and reactive to light.  Cardiovascular:     Rate and Rhythm: Normal rate and regular rhythm.  Pulmonary:     Effort: No respiratory distress.     Breath sounds: No wheezing.     Comments: Decreased breath sounds bilaterally. Abdominal:     General: Bowel sounds are normal. There is no distension.     Palpations: Abdomen is soft. There is no mass.     Tenderness: There is no abdominal tenderness. There is no guarding or rebound.  Musculoskeletal:        General: No tenderness. Normal range of motion.     Cervical back: Normal range of motion and neck supple.  Skin:    General: Skin is warm.  Neurological:     Mental Status: Brent Glass is alert and oriented to person, place, and time.  Psychiatric:        Mood and Affect: Affect normal.      LABORATORY DATA:  I have reviewed the data as listed Lab Results  Component Value Date   WBC 10.5 07/27/2022   HGB 12.9 (L) 07/27/2022   HCT 39.4 07/27/2022   MCV 86.8 07/27/2022   PLT 368 07/27/2022   Recent Labs    01/26/22 1250 04/28/22 1434 07/27/22 1416  NA 132* 134* 135  K 3.8 4.0 3.9  CL 103 102 102  CO2 23 22 22   GLUCOSE 323* 248* 124*  BUN 15 14 16   CREATININE 1.20 1.10 1.12  CALCIUM 9.6 9.5 9.4  GFRNONAA >60 >60 >60  PROT 6.9 6.7 7.2  ALBUMIN 3.6 3.5 3.8  AST 12* 13* 13*  ALT 8 9 7   ALKPHOS 123 102 110  BILITOT 0.3 0.4 0.4    Latest Reference Range & Units 01/23/20 12:22 03/10/20 14:11 03/19/20 09:19 04/16/20 09:10 05/14/20 10:06 06/13/20 09:26 07/11/20 10:13 08/07/20 09:24 09/04/20 10:13 09/16/20 13:59 10/21/20 13:59 12/23/20 13:27  Prostatic Specific Antigen 0.00 - 4.00 ng/mL 986.00 (H) 34.69 (H) 21.59 (H) 2.68 1.21 0.71  0.68 0.41 0.36 0.28 0.29 0.28  (  H): Data is abnormally high  RADIOGRAPHIC STUDIES: I have personally reviewed the radiological images as listed and agreed with the findings in the report. No results found.  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 Monitor for now- stable.  # continue X-tandi 120 mg/day-  Continue vitamin D.  Stable.   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 DEC 2023- The mixed cystic and solid lesion peripherally in the right upper lobe is unchanged in overall size and appearance from 07/17/2021. Suspicious for a low-grade bronchogenic adenocarcinoma.would follow up CT in  July-2024; will order CT scan today. stable.      # Chest tenderness-grade 1-2. Sec to ADT- continue tramadol prn; add vaseline BID- stable.   * eliagrd 45 07/27/2022.   # DISPOSITION:  # Eligard today # Follow up in 2  month - MD-;labs- cbc/cmp/PSA;CT chest prior- - Dr.B     All questions were answered. The patient knows to call the clinic with any problems, questions or concerns.    Earna Coder, MD 07/27/2022 3:52 PM

## 2022-09-27 ENCOUNTER — Ambulatory Visit
Admission: RE | Admit: 2022-09-27 | Discharge: 2022-09-27 | Disposition: A | Discharge status: Home / Self Care | Payer: Medicare Other | Source: Ambulatory Visit | Attending: Internal Medicine | Admitting: Internal Medicine

## 2022-09-27 DIAGNOSIS — C61 Malignant neoplasm of prostate: Secondary | ICD-10-CM | POA: Insufficient documentation

## 2022-09-27 DIAGNOSIS — R918 Other nonspecific abnormal finding of lung field: Secondary | ICD-10-CM | POA: Diagnosis present

## 2022-09-27 LAB — POCT I-STAT CREATININE: Creatinine, Ser: 1 mg/dL (ref 0.61–1.24)

## 2022-09-27 MED ORDER — IOHEXOL 300 MG/ML  SOLN
75.0000 mL | Freq: Once | INTRAMUSCULAR | Status: AC | PRN
  Administered 2022-09-27: 75 mL via INTRAVENOUS

## 2022-09-30 ENCOUNTER — Encounter: Payer: Self-pay | Admitting: Internal Medicine

## 2022-09-30 ENCOUNTER — Inpatient Hospital Stay (HOSPITAL_BASED_OUTPATIENT_CLINIC_OR_DEPARTMENT_OTHER): Payer: Medicare Other | Admitting: Internal Medicine

## 2022-09-30 ENCOUNTER — Inpatient Hospital Stay: Payer: Medicare Other | Attending: Internal Medicine

## 2022-09-30 VITALS — BP 130/63 | HR 75 | Temp 97.5°F | Resp 18 | Ht 72.0 in | Wt 214.6 lb

## 2022-09-30 DIAGNOSIS — F17210 Nicotine dependence, cigarettes, uncomplicated: Secondary | ICD-10-CM | POA: Insufficient documentation

## 2022-09-30 DIAGNOSIS — C61 Malignant neoplasm of prostate: Secondary | ICD-10-CM

## 2022-09-30 DIAGNOSIS — Z79899 Other long term (current) drug therapy: Secondary | ICD-10-CM | POA: Insufficient documentation

## 2022-09-30 DIAGNOSIS — C7800 Secondary malignant neoplasm of unspecified lung: Secondary | ICD-10-CM | POA: Diagnosis present

## 2022-09-30 DIAGNOSIS — C7951 Secondary malignant neoplasm of bone: Secondary | ICD-10-CM | POA: Insufficient documentation

## 2022-09-30 DIAGNOSIS — Z7982 Long term (current) use of aspirin: Secondary | ICD-10-CM | POA: Diagnosis not present

## 2022-09-30 LAB — CMP (CANCER CENTER ONLY)
ALT: 8 U/L (ref 0–44)
AST: 13 U/L — ABNORMAL LOW (ref 15–41)
Albumin: 3.7 g/dL (ref 3.5–5.0)
Alkaline Phosphatase: 98 U/L (ref 38–126)
Anion gap: 10 (ref 5–15)
BUN: 11 mg/dL (ref 8–23)
CO2: 23 mmol/L (ref 22–32)
Calcium: 9.7 mg/dL (ref 8.9–10.3)
Chloride: 102 mmol/L (ref 98–111)
Creatinine: 1.03 mg/dL (ref 0.61–1.24)
GFR, Estimated: >60 mL/min (ref >60)
Glucose, Bld: 205 mg/dL — ABNORMAL HIGH (ref 70–99)
Potassium: 4.1 mmol/L (ref 3.5–5.1)
Sodium: 135 mmol/L (ref 135–145)
Total Bilirubin: 0.4 mg/dL (ref 0.3–1.2)
Total Protein: 7 g/dL (ref 6.5–8.1)

## 2022-09-30 LAB — CBC WITH DIFFERENTIAL (CANCER CENTER ONLY)
Abs Immature Granulocytes: 0.03 10*3/uL (ref 0.00–0.07)
Basophils Absolute: 0.1 10*3/uL (ref 0.0–0.1)
Basophils Relative: 1 %
Eosinophils Absolute: 0.2 10*3/uL (ref 0.0–0.5)
Eosinophils Relative: 2 %
HCT: 40 % (ref 39.0–52.0)
Hemoglobin: 12.8 g/dL — ABNORMAL LOW (ref 13.0–17.0)
Immature Granulocytes: 0 %
Lymphocytes Relative: 40 %
Lymphs Abs: 3.7 10*3/uL (ref 0.7–4.0)
MCH: 27.8 pg (ref 26.0–34.0)
MCHC: 32 g/dL (ref 30.0–36.0)
MCV: 87 fL (ref 80.0–100.0)
Monocytes Absolute: 0.6 10*3/uL (ref 0.1–1.0)
Monocytes Relative: 7 %
Neutro Abs: 4.7 10*3/uL (ref 1.7–7.7)
Neutrophils Relative %: 50 %
Platelet Count: 360 10*3/uL (ref 150–400)
RBC: 4.6 MIL/uL (ref 4.22–5.81)
RDW: 14.2 % (ref 11.5–15.5)
WBC Count: 9.2 10*3/uL (ref 4.0–10.5)
nRBC: 0 % (ref 0.0–0.2)

## 2022-09-30 LAB — PSA: Prostatic Specific Antigen: 1.53 ng/mL (ref 0.00–4.00)

## 2022-09-30 MED ORDER — TRAMADOL HCL 50 MG PO TABS: 50.0000 mg | ORAL_TABLET | Freq: Every day | ORAL | 0 refills | Status: DC | PRN

## 2022-09-30 NOTE — Progress Notes (Signed)
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   # NOV 2021- CTA [ER] Diffuse bilateral pulmonary nodules including a cavitary lesion in the right upper lobe measuring approximately 2 cm. There is extensive nodular pleural thickening bilaterally, greatest in the left lower lobe.  November 2021 PET scan-multiple lung lesions multiple bone lesions and prostate uptake.  February 2022-prostate biopsy [Dr.Brandon]   # DM-2; NO COPD [?]; ACTIVE SMOKER 65ppd.  # DEC 2nd week- FIRMAGON; # JAN 12TH, 2022-ZYTIGA  1000 MG+ PRED 5MG ; FEB MID 2022- ELIGARD q6M; MARCH 2022- DISCONTINUE ZYTIGA sec to PRESS [severe hypokalemia/mental status changes hospitalization];  #   # MAY 6th, 2022- XTnadi 3pills/day   # NGS/MOLECULAR TESTS: MARCH 2022-foundation 1 no targetable mutation./Genetic evaluation negative    # PALLIATIVE CARE EVALUATION:  # PAIN MANAGEMENT:    DIAGNOSIS: Prostate cancer  STAGE:   IV      ;  GOALS: Palliative  CURRENT/MOST RECENT THERAPY : ADT+ Xtandi    Prostate cancer metastatic to multiple sites [facility])  02/04/2020 Initial Diagnosis   Prostate cancer metastatic to multiple sites [facility])   03/19/2020 Cancer Staging   Staging form: Prostate, AJCC 8th Edition - Clinical: Stage IVB (pM1b) - Signed by Earna Coder, MD on 03/19/2020    Genetic Testing   Negative genetic testing. No pathogenic variants identified on the [facility] Multi-Cancer Panel. VUS in AXIN2 called c.1235A>C identified. The report date is 03/23/2020.   The Multi-Cancer Panel offered by [facility] includes sequencing and/or deletion duplication testing of the  following 84 genes: AIP, ALK, APC, ATM, AXIN2,BAP1,  BARD1, BLM, BMPR1A, BRCA1, BRCA2, BRIP1, CASR, CDC73, CDH1, CDK4, CDKN1B, CDKN1C, CDKN2A (p14ARF), CDKN2A (p16INK4a), CEBPA, CHEK2, CTNNA1, DICER1, DIS3L2, EGFR (c.2369C>T, p.Thr790Met variant only), EPCAM (Deletion/duplication testing only), FH, FLCN, GATA2, GPC3, GREM1 (Promoter region deletion/duplication testing only), HOXB13 (c.251G>A, p.Gly84Glu), HRAS, KIT, MAX, MEN1, MET, MITF (c.952G>A, p.Glu318Lys variant only), MLH1, MSH2, MSH3, MSH6, MUTYH, NBN, NF1, NF2, NTHL1, PALB2, PDGFRA, PHOX2B, PMS2, POLD1, POLE, POT1, PRKAR1A, PTCH1, PTEN, RAD50, RAD51C, RAD51D, RB1, RECQL4, RET, RUNX1, SDHAF2, SDHA (sequence changes only), SDHB, SDHC, SDHD, SMAD4, SMARCA4, SMARCB1, SMARCE1, STK11, SUFU, TERC, TERT, TMEM127, TP53, TSC1, TSC2, VHL, WRN and WT1.      HISTORY OF PRESENTING ILLNESS: Ambulating independently. alone  Brent Glass 82 y.o.  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   No falls.  Denies any worsening bone pain or joint pains.  He states that he is compliant with his oral Xtandi. No fever no chills.  No constipation no diarrhea.  No nausea no vomiting.  Review of Systems  Constitutional:  Positive for malaise/fatigue. Negative for chills, diaphoresis and fever.  HENT:  Negative for nosebleeds and sore throat.   Eyes:  Negative for double vision.  Respiratory:  Negative for hemoptysis, sputum production, shortness of breath and wheezing.   Cardiovascular:  Negative for chest pain, palpitations, orthopnea and leg swelling.  Gastrointestinal:  Positive for abdominal pain. Negative for blood in stool, constipation, diarrhea, heartburn, melena, nausea and vomiting.  Genitourinary:  Negative for dysuria, frequency and urgency.  Musculoskeletal:  Positive  for back pain and myalgias. Negative for joint pain.  Skin: Negative.  Negative for itching  and rash.  Neurological:  Negative for dizziness, tingling, focal weakness, weakness and headaches.  Endo/Heme/Allergies:  Does not bruise/bleed easily.  Psychiatric/Behavioral:  Negative for depression. The patient is not nervous/anxious and does not have insomnia.      MEDICAL HISTORY:  Past Medical History:  Diagnosis Date   Arthritis    BPH (benign prostatic hyperplasia)    BPH with obstruction/lower urinary tract symptoms    Diabetes (HCC)    Disorder resulting from impaired renal function    Elevated PSA    Family history of lung cancer    Family history of lymphoma    Heart murmur    Hypertension    Knee pain    Lesion of lung    Low HDL (under 40)    Obesity    Urinary hesitancy     SURGICAL HISTORY: Past Surgical History:  Procedure Laterality Date   HERNIA REPAIR  2012   Umbilical Hernia Repair   KNEE ARTHROSCOPY Left 04/10/2015   Procedure: ARTHROSCOPY KNEE, partial medial meniscectomy, partial synovectomy;  Surgeon: Kennedy Bucker, MD;  Location: [facility] ORS;  Service: Orthopedics;  Laterality: Left;    SOCIAL HISTORY: Social History   Socioeconomic History   Marital status: Widowed    Spouse name: Not on file   Number of children: Not on file   Years of education: Not on file   Highest education level: Not on file  Occupational History   Not on file  Tobacco Use   Smoking status: Every Day    Current packs/day: 1.00    Types: Cigarettes   Smokeless tobacco: Never  Vaping Use   Vaping status: Never Used  Substance and Sexual Activity   Alcohol use: No    Alcohol/week: 0.0 standard drinks of alcohol   Drug use: No   Sexual activity: Not Currently  Other Topics Concern   Not on file  Social History Narrative   Lives in [place]; self; daughter lives 1 mile. Smoke 1ppd > 65 years; stopped 25 years ago.  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    Social Determinants of Health   Financial Resource Strain: Not on file  Food  Insecurity: Not on file  Transportation Needs: Not on file  Physical Activity: Not on file  Stress: Not on file  Social Connections: Not on file  Intimate Partner Violence: Not on file    FAMILY HISTORY: Family History  Problem Relation Age of Onset   Diabetes Mother    Lung cancer Mother    Lymphoma Son 28       died in 52.    Kidney disease Neg Hx    Prostate cancer Neg Hx     ALLERGIES:  has No Known Allergies.  MEDICATIONS:  Current Outpatient Medications  Medication Sig Dispense Refill   acetaminophen (TYLENOL) 650 MG CR tablet Take 650 mg by mouth every 8 (eight) hours as needed for pain.     albuterol (PROVENTIL HFA;VENTOLIN HFA) 108 (90 BASE) MCG/ACT inhaler Inhale 2 puffs into the lungs every 6 (six) hours as needed for wheezing or shortness of breath.     aspirin 81 MG tablet Take 81 mg by mouth daily.     cyanocobalamin 2000 MCG tablet Take 1 tablet (2,000 mcg total) by mouth daily. 90 tablet 1   enzalutamide (XTANDI) 40 MG tablet Take 3 tablets (120 mg total) by mouth daily.  90 tablet 3   finasteride (PROSCAR) 5 MG tablet TAKE 1 TABLET BY MOUTH EVERY DAY (Patient taking differently: Take 5 mg by mouth daily.) 90 tablet 3   furosemide (LASIX) 20 MG tablet Take 1 tablet (20 mg total) by mouth daily. 15 tablet 0   GLUMETZA 1000 MG 24 hr tablet Take 1,000 mg by mouth 2 (two) times daily.     metFORMIN (GLUCOPHAGE) 1000 MG tablet Take 1,000 mg by mouth 2 (two) times daily with a meal.     rosuvastatin (CRESTOR) 20 MG tablet Take 20 mg by mouth every morning.     simvastatin (ZOCOR) 40 MG tablet Take 40 mg by mouth daily.     thiamine 100 MG tablet TAKE 1 TABLET BY MOUTH EVERY DAY 90 tablet 1   traMADol (ULTRAM) 50 MG tablet Take 1 tablet (50 mg total) by mouth daily as needed. 60 tablet 0   No current facility-administered medications for this visit.      Marland Kitchen  PHYSICAL EXAMINATION: ECOG PERFORMANCE STATUS: 0 - Asymptomatic  Vitals:   09/30/22 1058  BP: 130/63   Pulse: 75  Resp: 18  Temp: (!) 97.5 F (36.4 C)  SpO2: 100%     Filed Weights   09/30/22 1058  Weight: 214 lb 9.6 oz (97.3 kg)     Physical Exam HENT:     Head: Normocephalic and atraumatic.     Mouth/Throat:     Pharynx: No oropharyngeal exudate.  Eyes:     Pupils: Pupils are equal, round, and reactive to light.  Cardiovascular:     Rate and Rhythm: Normal rate and regular rhythm.  Pulmonary:     Effort: No respiratory distress.     Breath sounds: No wheezing.     Comments: Decreased breath sounds bilaterally. Abdominal:     General: Bowel sounds are normal. There is no distension.     Palpations: Abdomen is soft. There is no mass.     Tenderness: There is no abdominal tenderness. There is no guarding or rebound.  Musculoskeletal:        General: No tenderness. Normal range of motion.     Cervical back: Normal range of motion and neck supple.  Skin:    General: Skin is warm.  Neurological:     Mental Status: He is alert and oriented to person, place, and time.  Psychiatric:        Mood and Affect: Affect normal.      LABORATORY DATA:  I have reviewed the data as listed Lab Results  Component Value Date   WBC 10.5 07/27/2022   HGB 12.9 (L) 07/27/2022   HCT 39.4 07/27/2022   MCV 86.8 07/27/2022   PLT 368 07/27/2022   Recent Labs    04/28/22 1434 07/27/22 1416 09/27/22 1131 09/30/22 1045  NA 134* 135  --  135  K 4.0 3.9  --  4.1  CL 102 102  --  102  CO2 22 22  --  23  GLUCOSE 248* 124*  --  205*  BUN 14 16  --  11  CREATININE 1.10 1.12 1.00 1.03  CALCIUM 9.5 9.4  --  9.7  GFRNONAA >60 >60  --  >60  PROT 6.7 7.2  --  7.0  ALBUMIN 3.5 3.8  --  3.7  AST 13* 13*  --  13*  ALT 9 7  --  8  ALKPHOS 102 110  --  98  BILITOT 0.4 0.4  --  0.4  Latest Reference Range & Units 01/23/20 12:22 03/10/20 14:11 03/19/20 09:19 04/16/20 09:10 05/14/20 10:06 06/13/20 09:26 07/11/20 10:13 08/07/20 09:24 09/04/20 10:13 09/16/20 13:59 10/21/20 13:59 12/23/20  13:27  Prostatic Specific Antigen 0.00 - 4.00 ng/mL 986.00 (H) 34.69 (H) 21.59 (H) 2.68 1.21 0.71 0.68 0.41 0.36 0.28 0.29 0.28  (H): Data is abnormally high  RADIOGRAPHIC STUDIES: I have personally reviewed the radiological images as listed and agreed with the findings in the report. No results found.  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 Monitor for now- stable.  # continue X-tandi 120 mg/day-  Continue vitamin D.  Stable clinically.   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 DEC 2023- The mixed cystic and solid lesion peripherally in the right upper lobe is unchanged in overall size and appearance from 07/17/2021. Suspicious for a low-grade bronchogenic adenocarcinoma.  CT in  July-2024- ? Stable/ awaiting final report.       # Chest tenderness-grade 1-2. Sec to ADT- continue tramadol prn-refiled; add vaseline BID- stable.   * eliagrd 45 07/27/2022.   # DISPOSITION:  # Follow up in 2  month - MD-;labs- cbc/cmp/PSA; Eligard - Dr.B  # I reviewed the blood work- with the patient in detail; also reviewed the imaging independently [as summarized above]; and with the patient in detail.   All questions were answered. The patient knows to call the clinic with any problems, questions or concerns.    Earna Coder, MD 09/30/2022 11:32 AM

## 2022-09-30 NOTE — Progress Notes (Signed)
No concerns for the provider today.

## 2022-09-30 NOTE — Assessment & Plan Note (Addendum)
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 Monitor for now- stable.  # continue X-tandi 120 mg/day-  Continue vitamin D.  Stable clinically.   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 DEC 2023- The mixed cystic and solid lesion peripherally in the right upper lobe is unchanged in overall size and appearance from 07/17/2021. Suspicious for a low-grade bronchogenic adenocarcinoma.  CT in  July-2024- ? Stable/ awaiting final report.       # Chest tenderness-grade 1-2. Sec to ADT- continue tramadol prn-refiled; add vaseline BID- stable.   * eliagrd 45 07/27/2022.   # DISPOSITION:  # Follow up in 2  month - MD-;labs- cbc/cmp/PSA; Eligard - Dr.B  # I reviewed the blood work- with the patient in detail; also reviewed the imaging independently [as summarized above]; and with the patient in detail.

## 2022-12-01 ENCOUNTER — Encounter: Payer: Self-pay | Admitting: Internal Medicine

## 2022-12-01 ENCOUNTER — Other Ambulatory Visit: Payer: Self-pay

## 2022-12-01 ENCOUNTER — Inpatient Hospital Stay (HOSPITAL_BASED_OUTPATIENT_CLINIC_OR_DEPARTMENT_OTHER): Payer: Medicare Other | Admitting: Internal Medicine

## 2022-12-01 ENCOUNTER — Inpatient Hospital Stay: Payer: Medicare Other | Attending: Internal Medicine

## 2022-12-01 ENCOUNTER — Inpatient Hospital Stay: Payer: Medicare Other

## 2022-12-01 VITALS — BP 140/61 | HR 68 | Temp 97.9°F | Ht 72.0 in | Wt 210.0 lb

## 2022-12-01 DIAGNOSIS — C61 Malignant neoplasm of prostate: Secondary | ICD-10-CM

## 2022-12-01 DIAGNOSIS — Z79899 Other long term (current) drug therapy: Secondary | ICD-10-CM | POA: Insufficient documentation

## 2022-12-01 DIAGNOSIS — E119 Type 2 diabetes mellitus without complications: Secondary | ICD-10-CM | POA: Diagnosis not present

## 2022-12-01 DIAGNOSIS — C7800 Secondary malignant neoplasm of unspecified lung: Secondary | ICD-10-CM | POA: Insufficient documentation

## 2022-12-01 DIAGNOSIS — Z7984 Long term (current) use of oral hypoglycemic drugs: Secondary | ICD-10-CM | POA: Insufficient documentation

## 2022-12-01 DIAGNOSIS — F17210 Nicotine dependence, cigarettes, uncomplicated: Secondary | ICD-10-CM | POA: Insufficient documentation

## 2022-12-01 DIAGNOSIS — C7951 Secondary malignant neoplasm of bone: Secondary | ICD-10-CM | POA: Insufficient documentation

## 2022-12-01 DIAGNOSIS — Z7982 Long term (current) use of aspirin: Secondary | ICD-10-CM | POA: Insufficient documentation

## 2022-12-01 LAB — CBC WITH DIFFERENTIAL (CANCER CENTER ONLY)
Abs Immature Granulocytes: 0.05 10*3/uL (ref 0.00–0.07)
Basophils Absolute: 0.1 10*3/uL (ref 0.0–0.1)
Basophils Relative: 1 %
Eosinophils Absolute: 0.3 10*3/uL (ref 0.0–0.5)
Eosinophils Relative: 3 %
HCT: 40.2 % (ref 39.0–52.0)
Hemoglobin: 12.9 g/dL — ABNORMAL LOW (ref 13.0–17.0)
Immature Granulocytes: 1 %
Lymphocytes Relative: 36 %
Lymphs Abs: 3.2 10*3/uL (ref 0.7–4.0)
MCH: 28 pg (ref 26.0–34.0)
MCHC: 32.1 g/dL (ref 30.0–36.0)
MCV: 87.4 fL (ref 80.0–100.0)
Monocytes Absolute: 0.7 10*3/uL (ref 0.1–1.0)
Monocytes Relative: 7 %
Neutro Abs: 4.8 10*3/uL (ref 1.7–7.7)
Neutrophils Relative %: 52 %
Platelet Count: 324 10*3/uL (ref 150–400)
RBC: 4.6 MIL/uL (ref 4.22–5.81)
RDW: 14.4 % (ref 11.5–15.5)
Smear Review: NORMAL
WBC Count: 9.1 10*3/uL (ref 4.0–10.5)
nRBC: 0.2 % (ref 0.0–0.2)

## 2022-12-01 LAB — BASIC METABOLIC PANEL - CANCER CENTER ONLY
Anion gap: 8 (ref 5–15)
BUN: 14 mg/dL (ref 8–23)
CO2: 22 mmol/L (ref 22–32)
Calcium: 9.8 mg/dL (ref 8.9–10.3)
Chloride: 108 mmol/L (ref 98–111)
Creatinine: 0.98 mg/dL (ref 0.61–1.24)
GFR, Estimated: >60 mL/min (ref >60)
Glucose, Bld: 140 mg/dL — ABNORMAL HIGH (ref 70–99)
Potassium: 4 mmol/L (ref 3.5–5.1)
Sodium: 138 mmol/L (ref 135–145)

## 2022-12-01 LAB — PSA: Prostatic Specific Antigen: 1.87 ng/mL (ref 0.00–4.00)

## 2022-12-01 NOTE — Assessment & Plan Note (Addendum)
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 Monitor for now-overall clinically stable.   # continue X-tandi 120 mg/day-  Continue vitamin D.  Stable clinically, but with rising PSA- will order PSMA PET scan.   # Hot flashes- sec to ADT- monitor for now.   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 DEC 2023- The mixed cystic and solid lesion peripherally in the right upper lobe is unchanged in overall size and appearance from 07/17/2021. Suspicious for a low-grade bronchogenic adenocarcinoma.  CT in  July-2024- Stable mixed lesion in the right upper lobe. Recommend continued surveillance. No new dominant mass, fluid collection or lymph node enlargement in the thorax.    # Chest tenderness-grade 1-2. Sec to ADT- continue tramadol prn-refiled; add vaseline BID- stable.   * eliagrd 45 07/27/2022.   # DISPOSITION:  # No eligard today # Follow up in 2  month - MD-;labs- cbc/cmp/PSA; Eligard-PET scan prior- - Dr.B

## 2022-12-01 NOTE — Progress Notes (Signed)
C/o right arm pain since starting xtandi, pain moves from place to place.

## 2022-12-01 NOTE — Progress Notes (Signed)
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   # NOV 2021- CTA [ER] Diffuse bilateral pulmonary nodules including a cavitary lesion in the right upper lobe measuring approximately 2 cm. There is extensive nodular pleural thickening bilaterally, greatest in the left lower lobe.  November 2021 PET scan-multiple lung lesions multiple bone lesions and prostate uptake.  February 2022-prostate biopsy [Dr.Brandon]   # DM-2; NO COPD [?]; ACTIVE SMOKER 65ppd.  # DEC 2nd week- FIRMAGON; # JAN 12TH, 2022-ZYTIGA  1000 MG+ PRED 5MG ; FEB MID 2022- ELIGARD q6M; MARCH 2022- DISCONTINUE ZYTIGA sec to PRESS [severe hypokalemia/mental status changes hospitalization];  #   # MAY 6th, 2022- XTnadi 3pills/day   # NGS/MOLECULAR TESTS: MARCH 2022-foundation 1 no targetable mutation./Genetic evaluation negative    # PALLIATIVE CARE EVALUATION:  # PAIN MANAGEMENT:    DIAGNOSIS: Prostate cancer  STAGE:   IV      ;  GOALS: Palliative  CURRENT/MOST RECENT THERAPY : ADT+ Xtandi    Prostate cancer metastatic to multiple sites [facility])  02/04/2020 Initial Diagnosis   Prostate cancer metastatic to multiple sites [facility])   03/19/2020 Cancer Staging   Staging form: Prostate, AJCC 8th Edition - Clinical: Stage IVB (pM1b) - Signed by Earna Coder, MD on 03/19/2020    Genetic Testing   Negative genetic testing. No pathogenic variants identified on the [facility] Multi-Cancer Panel. VUS in AXIN2 called c.1235A>C identified. The report date is 03/23/2020.   The Multi-Cancer Panel offered by [facility] includes sequencing and/or deletion duplication testing of the  following 84 genes: AIP, ALK, APC, ATM, AXIN2,BAP1,  BARD1, BLM, BMPR1A, BRCA1, BRCA2, BRIP1, CASR, CDC73, CDH1, CDK4, CDKN1B, CDKN1C, CDKN2A (p14ARF), CDKN2A (p16INK4a), CEBPA, CHEK2, CTNNA1, DICER1, DIS3L2, EGFR (c.2369C>T, p.Thr790Met variant only), EPCAM (Deletion/duplication testing only), FH, FLCN, GATA2, GPC3, GREM1 (Promoter region deletion/duplication testing only), HOXB13 (c.251G>A, p.Gly84Glu), HRAS, KIT, MAX, MEN1, MET, MITF (c.952G>A, p.Glu318Lys variant only), MLH1, MSH2, MSH3, MSH6, MUTYH, NBN, NF1, NF2, NTHL1, PALB2, PDGFRA, PHOX2B, PMS2, POLD1, POLE, POT1, PRKAR1A, PTCH1, PTEN, RAD50, RAD51C, RAD51D, RB1, RECQL4, RET, RUNX1, SDHAF2, SDHA (sequence changes only), SDHB, SDHC, SDHD, SMAD4, SMARCA4, SMARCB1, SMARCE1, STK11, SUFU, TERC, TERT, TMEM127, TP53, TSC1, TSC2, VHL, WRN and WT1.      HISTORY OF PRESENTING ILLNESS: Ambulating independently. alone  Brent Glass 82 y.o.  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  c/o right arm pain chronic for last few years. No swelling. Radiates to "different parts of the body". On prn tylenol prn.   No falls.  He states that he is compliant with his oral Xtandi. No fever no chills.  No constipation no diarrhea.  No nausea no vomiting.  Review of Systems  Constitutional:  Positive for malaise/fatigue. Negative for chills, diaphoresis and fever.  HENT:  Negative for nosebleeds and sore throat.   Eyes:  Negative for double vision.  Respiratory:  Negative for hemoptysis, sputum production, shortness of breath and wheezing.   Cardiovascular:  Negative for chest pain, palpitations, orthopnea and leg swelling.  Gastrointestinal:  Positive for abdominal pain. Negative for blood in stool, constipation, diarrhea, heartburn, melena, nausea and vomiting.  Genitourinary:  Negative for dysuria, frequency  and urgency.  Musculoskeletal:  Positive for back pain and myalgias. Negative for joint  pain.  Skin: Negative.  Negative for itching and rash.  Neurological:  Negative for dizziness, tingling, focal weakness, weakness and headaches.  Endo/Heme/Allergies:  Does not bruise/bleed easily.  Psychiatric/Behavioral:  Negative for depression. The patient is not nervous/anxious and does not have insomnia.      MEDICAL HISTORY:  Past Medical History:  Diagnosis Date   Arthritis    BPH (benign prostatic hyperplasia)    BPH with obstruction/lower urinary tract symptoms    Diabetes (HCC)    Disorder resulting from impaired renal function    Elevated PSA    Family history of lung cancer    Family history of lymphoma    Heart murmur    Hypertension    Knee pain    Lesion of lung    Low HDL (under 40)    Obesity    Urinary hesitancy     SURGICAL HISTORY: Past Surgical History:  Procedure Laterality Date   HERNIA REPAIR  2012   Umbilical Hernia Repair   KNEE ARTHROSCOPY Left 04/10/2015   Procedure: ARTHROSCOPY KNEE, partial medial meniscectomy, partial synovectomy;  Surgeon: Kennedy Bucker, MD;  Location: [facility] ORS;  Service: Orthopedics;  Laterality: Left;    SOCIAL HISTORY: Social History   Socioeconomic History   Marital status: Widowed    Spouse name: Not on file   Number of children: Not on file   Years of education: Not on file   Highest education level: Not on file  Occupational History   Not on file  Tobacco Use   Smoking status: Every Day    Current packs/day: 1.00    Types: Cigarettes   Smokeless tobacco: Never  Vaping Use   Vaping status: Never Used  Substance and Sexual Activity   Alcohol use: No    Alcohol/week: 0.0 standard drinks of alcohol   Drug use: No   Sexual activity: Not Currently  Other Topics Concern   Not on file  Social History Narrative   Lives in [place]; self; daughter lives 1 mile. Smoke 1ppd > 65 years; stopped 25 years ago.  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    Social Determinants of Health    Financial Resource Strain: Not on file  Food Insecurity: Not on file  Transportation Needs: Not on file  Physical Activity: Not on file  Stress: Not on file  Social Connections: Not on file  Intimate Partner Violence: Not on file    FAMILY HISTORY: Family History  Problem Relation Age of Onset   Diabetes Mother    Lung cancer Mother    Lymphoma Son 28       died in 44.    Kidney disease Neg Hx    Prostate cancer Neg Hx     ALLERGIES:  has No Known Allergies.  MEDICATIONS:  Current Outpatient Medications  Medication Sig Dispense Refill   acetaminophen (TYLENOL) 650 MG CR tablet Take 650 mg by mouth every 8 (eight) hours as needed for pain.     albuterol (PROVENTIL HFA;VENTOLIN HFA) 108 (90 BASE) MCG/ACT inhaler Inhale 2 puffs into the lungs every 6 (six) hours as needed for wheezing or shortness of breath.     aspirin 81 MG tablet Take 81 mg by mouth daily.     cyanocobalamin 2000 MCG tablet Take 1 tablet (2,000 mcg total) by mouth daily. 90 tablet 1   enzalutamide (XTANDI) 40 MG tablet Take 3 tablets (  120 mg total) by mouth daily. 90 tablet 3   finasteride (PROSCAR) 5 MG tablet TAKE 1 TABLET BY MOUTH EVERY DAY (Patient taking differently: Take 5 mg by mouth daily.) 90 tablet 3   furosemide (LASIX) 20 MG tablet Take 1 tablet (20 mg total) by mouth daily. 15 tablet 0   GLUMETZA 1000 MG 24 hr tablet Take 1,000 mg by mouth 2 (two) times daily.     metFORMIN (GLUCOPHAGE) 1000 MG tablet Take 1,000 mg by mouth 2 (two) times daily with a meal.     rosuvastatin (CRESTOR) 20 MG tablet Take 20 mg by mouth every morning.     simvastatin (ZOCOR) 40 MG tablet Take 40 mg by mouth daily.     thiamine 100 MG tablet TAKE 1 TABLET BY MOUTH EVERY DAY 90 tablet 1   traMADol (ULTRAM) 50 MG tablet Take 1 tablet (50 mg total) by mouth daily as needed. 60 tablet 0   No current facility-administered medications for this visit.      Marland Kitchen  PHYSICAL EXAMINATION: ECOG PERFORMANCE STATUS: 0 -  Asymptomatic  Vitals:   12/01/22 0911  BP: (!) 140/61  Pulse: 68  Temp: 97.9 F (36.6 C)  SpO2: 99%      Filed Weights   12/01/22 0911  Weight: 210 lb (95.3 kg)      Physical Exam HENT:     Head: Normocephalic and atraumatic.     Mouth/Throat:     Pharynx: No oropharyngeal exudate.  Eyes:     Pupils: Pupils are equal, round, and reactive to light.  Cardiovascular:     Rate and Rhythm: Normal rate and regular rhythm.  Pulmonary:     Effort: No respiratory distress.     Breath sounds: No wheezing.     Comments: Decreased breath sounds bilaterally. Abdominal:     General: Bowel sounds are normal. There is no distension.     Palpations: Abdomen is soft. There is no mass.     Tenderness: There is no abdominal tenderness. There is no guarding or rebound.  Musculoskeletal:        General: No tenderness. Normal range of motion.     Cervical back: Normal range of motion and neck supple.  Skin:    General: Skin is warm.  Neurological:     Mental Status: He is alert and oriented to person, place, and time.  Psychiatric:        Mood and Affect: Affect normal.      LABORATORY DATA:  I have reviewed the data as listed Lab Results  Component Value Date   WBC 9.1 12/01/2022   HGB 12.9 (L) 12/01/2022   HCT 40.2 12/01/2022   MCV 87.4 12/01/2022   PLT 324 12/01/2022   Recent Labs    04/28/22 1434 07/27/22 1416 09/27/22 1131 09/30/22 1045 12/01/22 0919  NA 134* 135  --  135 138  K 4.0 3.9  --  4.1 4.0  CL 102 102  --  102 108  CO2 22 22  --  23 22  GLUCOSE 248* 124*  --  205* 140*  BUN 14 16  --  11 14  CREATININE 1.10 1.12 1.00 1.03 0.98  CALCIUM 9.5 9.4  --  9.7 9.8  GFRNONAA >60 >60  --  >60 >60  PROT 6.7 7.2  --  7.0  --   ALBUMIN 3.5 3.8  --  3.7  --   AST 13* 13*  --  13*  --  ALT 9 7  --  8  --   ALKPHOS 102 110  --  98  --   BILITOT 0.4 0.4  --  0.4  --     Latest Reference Range & Units 01/23/20 12:22 03/10/20 14:11 03/19/20 09:19 04/16/20  09:10 05/14/20 10:06 06/13/20 09:26 07/11/20 10:13 08/07/20 09:24 09/04/20 10:13 09/16/20 13:59 10/21/20 13:59 12/23/20 13:27  Prostatic Specific Antigen 0.00 - 4.00 ng/mL 986.00 (H) 34.69 (H) 21.59 (H) 2.68 1.21 0.71 0.68 0.41 0.36 0.28 0.29 0.28  (H): Data is abnormally high  RADIOGRAPHIC STUDIES: I have personally reviewed the radiological images as listed and agreed with the findings in the report. No results found.  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 Monitor for now-overall clinically stable.   # continue X-tandi 120 mg/day-  Continue vitamin D.  Stable clinically, but with rising PSA- will order PSMA PET scan.   # Hot flashes- sec to ADT- monitor for now.   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 DEC 2023- The mixed cystic and solid lesion peripherally in the right upper lobe is unchanged in overall size and appearance from 07/17/2021. Suspicious for a low-grade bronchogenic adenocarcinoma.  CT in  July-2024- Stable mixed lesion in the right upper lobe. Recommend continued surveillance. No new dominant mass, fluid collection or lymph node enlargement in the thorax.    # Chest tenderness-grade 1-2. Sec to ADT- continue tramadol prn-refiled; add vaseline BID- stable.   * eliagrd 45 07/27/2022.   # DISPOSITION:  # No eligard today # Follow up in 2  month - MD-;labs- cbc/cmp/PSA; Eligard-PET scan prior- - Dr.B  All questions were answered. The patient knows to call the clinic with any problems, questions or concerns.    Earna Coder, MD 12/01/2022 10:53 AM

## 2022-12-08 ENCOUNTER — Ambulatory Visit: Payer: Medicare Other

## 2022-12-16 ENCOUNTER — Ambulatory Visit
Admission: RE | Admit: 2022-12-16 | Discharge: 2022-12-16 | Disposition: A | Discharge status: Home / Self Care | Payer: Medicare Other | Source: Ambulatory Visit | Attending: Internal Medicine | Admitting: Internal Medicine

## 2022-12-16 DIAGNOSIS — C61 Malignant neoplasm of prostate: Secondary | ICD-10-CM | POA: Diagnosis present

## 2022-12-16 MED ORDER — FLOTUFOLASTAT F 18 GALLIUM 296-5846 MBQ/ML IV SOLN
8.0000 | Freq: Once | INTRAVENOUS | Status: AC
  Administered 2022-12-16: 8.58 via INTRAVENOUS
  Filled 2022-12-16: qty 8

## 2023-01-18 ENCOUNTER — Inpatient Hospital Stay: Payer: Medicare Other

## 2023-01-18 ENCOUNTER — Telehealth: Payer: Self-pay | Admitting: *Deleted

## 2023-01-18 ENCOUNTER — Inpatient Hospital Stay (HOSPITAL_BASED_OUTPATIENT_CLINIC_OR_DEPARTMENT_OTHER): Payer: Medicare Other | Admitting: Internal Medicine

## 2023-01-18 ENCOUNTER — Encounter: Payer: Self-pay | Admitting: Internal Medicine

## 2023-01-18 ENCOUNTER — Inpatient Hospital Stay: Payer: Medicare Other | Attending: Internal Medicine

## 2023-01-18 VITALS — BP 149/61 | HR 80 | Temp 97.5°F | Ht 72.0 in | Wt 213.8 lb

## 2023-01-18 DIAGNOSIS — C7951 Secondary malignant neoplasm of bone: Secondary | ICD-10-CM | POA: Insufficient documentation

## 2023-01-18 DIAGNOSIS — F17210 Nicotine dependence, cigarettes, uncomplicated: Secondary | ICD-10-CM | POA: Insufficient documentation

## 2023-01-18 DIAGNOSIS — C7800 Secondary malignant neoplasm of unspecified lung: Secondary | ICD-10-CM | POA: Insufficient documentation

## 2023-01-18 DIAGNOSIS — C61 Malignant neoplasm of prostate: Secondary | ICD-10-CM

## 2023-01-18 DIAGNOSIS — Z79818 Long term (current) use of other agents affecting estrogen receptors and estrogen levels: Secondary | ICD-10-CM | POA: Diagnosis not present

## 2023-01-18 LAB — CBC WITH DIFFERENTIAL (CANCER CENTER ONLY)
Abs Immature Granulocytes: 0.05 10*3/uL (ref 0.00–0.07)
Basophils Absolute: 0.1 10*3/uL (ref 0.0–0.1)
Basophils Relative: 1 %
Eosinophils Absolute: 0.2 10*3/uL (ref 0.0–0.5)
Eosinophils Relative: 2 %
HCT: 37.5 % — ABNORMAL LOW (ref 39.0–52.0)
Hemoglobin: 11.9 g/dL — ABNORMAL LOW (ref 13.0–17.0)
Immature Granulocytes: 1 %
Lymphocytes Relative: 33 %
Lymphs Abs: 3.6 10*3/uL (ref 0.7–4.0)
MCH: 27.7 pg (ref 26.0–34.0)
MCHC: 31.7 g/dL (ref 30.0–36.0)
MCV: 87.4 fL (ref 80.0–100.0)
Monocytes Absolute: 0.7 10*3/uL (ref 0.1–1.0)
Monocytes Relative: 7 %
Neutro Abs: 6.1 10*3/uL (ref 1.7–7.7)
Neutrophils Relative %: 56 %
Platelet Count: 501 10*3/uL — ABNORMAL HIGH (ref 150–400)
RBC: 4.29 MIL/uL (ref 4.22–5.81)
RDW: 14 % (ref 11.5–15.5)
WBC Count: 10.8 10*3/uL — ABNORMAL HIGH (ref 4.0–10.5)
nRBC: 0 % (ref 0.0–0.2)

## 2023-01-18 LAB — CMP (CANCER CENTER ONLY)
ALT: 7 U/L (ref 0–44)
AST: 11 U/L — ABNORMAL LOW (ref 15–41)
Albumin: 3.6 g/dL (ref 3.5–5.0)
Alkaline Phosphatase: 126 U/L (ref 38–126)
Anion gap: 11 (ref 5–15)
BUN: 12 mg/dL (ref 8–23)
CO2: 24 mmol/L (ref 22–32)
Calcium: 9.7 mg/dL (ref 8.9–10.3)
Chloride: 104 mmol/L (ref 98–111)
Creatinine: 1.02 mg/dL (ref 0.61–1.24)
GFR, Estimated: >60 mL/min (ref >60)
Glucose, Bld: 192 mg/dL — ABNORMAL HIGH (ref 70–99)
Potassium: 3.8 mmol/L (ref 3.5–5.1)
Sodium: 139 mmol/L (ref 135–145)
Total Bilirubin: 0.3 mg/dL (ref <1.2)
Total Protein: 6.9 g/dL (ref 6.5–8.1)

## 2023-01-18 LAB — PSA: Prostatic Specific Antigen: 2.28 ng/mL (ref 0.00–4.00)

## 2023-01-18 MED ORDER — LEUPROLIDE ACETATE (6 MONTH) 45 MG ~~LOC~~ KIT
45.0000 mg | PACK | Freq: Once | SUBCUTANEOUS | Status: AC
  Administered 2023-01-18: 45 mg via SUBCUTANEOUS

## 2023-01-18 NOTE — Assessment & Plan Note (Addendum)
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 OCT 1st, 2024-  Single focus of intense radiotracer activity in the posterior aspect of the RIGHT lobe of the prostate gland is concerning for local prostate adenocarcinoma recurrence;  Several foci of intense radiotracer activity within the lung parenchyma and pleural surfaces are concerning for metastatic prostate cancer. Some of the foci are difficult to define CT correlation.  Single focus of radiotracer activity in the anterior RIGHT lower rib is concerning for solitary rib metastasis.will ask radiology re: comparison to PSMA scan in 2022.   # continue X-tandi 120 mg/day-  Continue vitamin D.  Stable clinically, but with rising PSA-. Monitor for now. If worsened would recommend chemotherapy.   # Hot flashes- sec to ADT- monitor for now.   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  CT in  July-2024- Stable mixed lesion in the right upper lobe. Recommend continued surveillance. No new dominant mass, fluid collection or lymph node enlargement in the thorax. Stable- on most recent PET scan in OCT 2024.     # Chest tenderness-grade 1-2. Sec to ADT- continue tramadol prn-refiled; add vaseline BID- stable.   * eliagrd 45 11/12//2024.   # DISPOSITION:   # Eligard # Follow up in 2 month - MD-;labs- cbc/cmp/PSA - Dr.B  # I reviewed the blood work- with the patient in detail; also reviewed the imaging independently [as summarized above]; and with the patient in detail.

## 2023-01-18 NOTE — Progress Notes (Signed)
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   # NOV 2021- CTA [ER] Diffuse bilateral pulmonary nodules including a cavitary lesion in the right upper lobe measuring approximately 2 cm. There is extensive nodular pleural thickening bilaterally, greatest in the left lower lobe.  November 2021 PET scan-multiple lung lesions multiple bone lesions and prostate uptake.  February 2022-prostate biopsy [Dr.Brandon]   # DM-2; NO COPD [?]; ACTIVE SMOKER 65ppd.  # DEC 2nd week- FIRMAGON; # JAN 12TH, 2022-ZYTIGA  1000 MG+ PRED 5MG ; FEB MID 2022- ELIGARD q6M; MARCH 2022- DISCONTINUE ZYTIGA sec to PRESS [severe hypokalemia/mental status changes hospitalization];  #   # MAY 6th, 2022- XTnadi 3pills/day   # NGS/MOLECULAR TESTS: MARCH 2022-foundation 1 no targetable mutation./Genetic evaluation negative    # PALLIATIVE CARE EVALUATION:  # PAIN MANAGEMENT:    DIAGNOSIS: Prostate cancer  STAGE:   IV      ;  GOALS: Palliative  CURRENT/MOST RECENT THERAPY : ADT+ Xtandi    Prostate cancer metastatic to multiple sites [facility])  02/04/2020 Initial Diagnosis   Prostate cancer metastatic to multiple sites [facility])   03/19/2020 Cancer Staging   Staging form: Prostate, AJCC 8th Edition - Clinical: Stage IVB (pM1b) - Signed by Earna Coder, MD on 03/19/2020    Genetic Testing   Negative genetic testing. No pathogenic variants identified on the [facility] Multi-Cancer Panel. VUS in AXIN2 called c.1235A>C identified. The report date is 03/23/2020.   The Multi-Cancer Panel offered by [facility] includes sequencing and/or deletion duplication testing of the  following 84 genes: AIP, ALK, APC, ATM, AXIN2,BAP1,  BARD1, BLM, BMPR1A, BRCA1, BRCA2, BRIP1, CASR, CDC73, CDH1, CDK4, CDKN1B, CDKN1C, CDKN2A (p14ARF), CDKN2A (p16INK4a), CEBPA, CHEK2, CTNNA1, DICER1, DIS3L2, EGFR (c.2369C>T, p.Thr790Met variant only), EPCAM (Deletion/duplication testing only), FH, FLCN, GATA2, GPC3, GREM1 (Promoter region deletion/duplication testing only), HOXB13 (c.251G>A, p.Gly84Glu), HRAS, KIT, MAX, MEN1, MET, MITF (c.952G>A, p.Glu318Lys variant only), MLH1, MSH2, MSH3, MSH6, MUTYH, NBN, NF1, NF2, NTHL1, PALB2, PDGFRA, PHOX2B, PMS2, POLD1, POLE, POT1, PRKAR1A, PTCH1, PTEN, RAD50, RAD51C, RAD51D, RB1, RECQL4, RET, RUNX1, SDHAF2, SDHA (sequence changes only), SDHB, SDHC, SDHD, SMAD4, SMARCA4, SMARCB1, SMARCE1, STK11, SUFU, TERC, TERT, TMEM127, TP53, TSC1, TSC2, VHL, WRN and WT1.      HISTORY OF PRESENTING ILLNESS: Ambulating independently. alone  Brent Glass 82 y.o.  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   No falls.  He states that he is compliant with his oral Xtandi. No fever no chills.  No constipation no diarrhea.  No nausea no vomiting.denies any worsening pain.   Review of Systems  Constitutional:  Positive for malaise/fatigue. Negative for chills, diaphoresis and fever.  HENT:  Negative for nosebleeds and sore throat.   Eyes:  Negative for double vision.  Respiratory:  Negative for hemoptysis, sputum production, shortness of breath and wheezing.   Cardiovascular:  Negative for chest pain, palpitations, orthopnea and leg swelling.  Gastrointestinal:  Positive for abdominal pain. Negative for blood in stool, constipation, diarrhea, heartburn, melena, nausea and vomiting.  Genitourinary:  Negative for dysuria, frequency and urgency.  Musculoskeletal:  Positive for back pain and myalgias.  Negative for joint pain.  Skin: Negative.  Negative for itching and rash.   Neurological:  Negative for dizziness, tingling, focal weakness, weakness and headaches.  Endo/Heme/Allergies:  Does not bruise/bleed easily.  Psychiatric/Behavioral:  Negative for depression. The patient is not nervous/anxious and does not have insomnia.      MEDICAL HISTORY:  Past Medical History:  Diagnosis Date   Arthritis    BPH (benign prostatic hyperplasia)    BPH with obstruction/lower urinary tract symptoms    Diabetes (HCC)    Disorder resulting from impaired renal function    Elevated PSA    Family history of lung cancer    Family history of lymphoma    Heart murmur    Hypertension    Knee pain    Lesion of lung    Low HDL (under 40)    Obesity    Urinary hesitancy     SURGICAL HISTORY: Past Surgical History:  Procedure Laterality Date   HERNIA REPAIR  2012   Umbilical Hernia Repair   KNEE ARTHROSCOPY Left 04/10/2015   Procedure: ARTHROSCOPY KNEE, partial medial meniscectomy, partial synovectomy;  Surgeon: Kennedy Bucker, MD;  Location: [facility] ORS;  Service: Orthopedics;  Laterality: Left;    SOCIAL HISTORY: Social History   Socioeconomic History   Marital status: Widowed    Spouse name: Not on file   Number of children: Not on file   Years of education: Not on file   Highest education level: Not on file  Occupational History   Not on file  Tobacco Use   Smoking status: Every Day    Current packs/day: 1.00    Types: Cigarettes   Smokeless tobacco: Never  Vaping Use   Vaping status: Never Used  Substance and Sexual Activity   Alcohol use: No    Alcohol/week: 0.0 standard drinks of alcohol   Drug use: No   Sexual activity: Not Currently  Other Topics Concern   Not on file  Social History Narrative   Lives in [place]; self; daughter lives 1 mile. Smoke 1ppd > 65 years; stopped 25 years ago.  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    Social Determinants of Health   Financial Resource Strain: Not on file  Food Insecurity:  Not on file  Transportation Needs: Not on file  Physical Activity: Not on file  Stress: Not on file  Social Connections: Not on file  Intimate Partner Violence: Not on file    FAMILY HISTORY: Family History  Problem Relation Age of Onset   Diabetes Mother    Lung cancer Mother    Lymphoma Son 28       died in 62.    Kidney disease Neg Hx    Prostate cancer Neg Hx     ALLERGIES:  has No Known Allergies.  MEDICATIONS:  Current Outpatient Medications  Medication Sig Dispense Refill   acetaminophen (TYLENOL) 650 MG CR tablet Take 650 mg by mouth every 8 (eight) hours as needed for pain.     albuterol (PROVENTIL HFA;VENTOLIN HFA) 108 (90 BASE) MCG/ACT inhaler Inhale 2 puffs into the lungs every 6 (six) hours as needed for wheezing or shortness of breath.     aspirin 81 MG tablet Take 81 mg by mouth daily.     cyanocobalamin 2000 MCG tablet Take 1 tablet (2,000 mcg total) by mouth daily. 90 tablet 1   enzalutamide (XTANDI) 40 MG tablet Take 3 tablets (120 mg total) by mouth daily. 90 tablet 3  finasteride (PROSCAR) 5 MG tablet TAKE 1 TABLET BY MOUTH EVERY DAY (Patient taking differently: Take 5 mg by mouth daily.) 90 tablet 3   furosemide (LASIX) 20 MG tablet Take 1 tablet (20 mg total) by mouth daily. 15 tablet 0   GLUMETZA 1000 MG 24 hr tablet Take 1,000 mg by mouth 2 (two) times daily.     metFORMIN (GLUCOPHAGE) 1000 MG tablet Take 1,000 mg by mouth 2 (two) times daily with a meal.     rosuvastatin (CRESTOR) 20 MG tablet Take 20 mg by mouth every morning.     simvastatin (ZOCOR) 40 MG tablet Take 40 mg by mouth daily.     thiamine 100 MG tablet TAKE 1 TABLET BY MOUTH EVERY DAY 90 tablet 1   traMADol (ULTRAM) 50 MG tablet Take 1 tablet (50 mg total) by mouth daily as needed. 60 tablet 0   No current facility-administered medications for this visit.      Marland Kitchen  PHYSICAL EXAMINATION: ECOG PERFORMANCE STATUS: 0 - Asymptomatic  Vitals:   01/18/23 1419  BP: (!) 149/61  Pulse:  80  Temp: (!) 97.5 F (36.4 C)  SpO2: 100%      Filed Weights   01/18/23 1419  Weight: 213 lb 12.8 oz (97 kg)      Physical Exam HENT:     Head: Normocephalic and atraumatic.     Mouth/Throat:     Pharynx: No oropharyngeal exudate.  Eyes:     Pupils: Pupils are equal, round, and reactive to light.  Cardiovascular:     Rate and Rhythm: Normal rate and regular rhythm.  Pulmonary:     Effort: No respiratory distress.     Breath sounds: No wheezing.     Comments: Decreased breath sounds bilaterally. Abdominal:     General: Bowel sounds are normal. There is no distension.     Palpations: Abdomen is soft. There is no mass.     Tenderness: There is no abdominal tenderness. There is no guarding or rebound.  Musculoskeletal:        General: No tenderness. Normal range of motion.     Cervical back: Normal range of motion and neck supple.  Skin:    General: Skin is warm.  Neurological:     Mental Status: He is alert and oriented to person, place, and time.  Psychiatric:        Mood and Affect: Affect normal.      LABORATORY DATA:  I have reviewed the data as listed Lab Results  Component Value Date   WBC 10.8 (H) 01/18/2023   HGB 11.9 (L) 01/18/2023   HCT 37.5 (L) 01/18/2023   MCV 87.4 01/18/2023   PLT 501 (H) 01/18/2023   Recent Labs    07/27/22 1416 09/27/22 1131 09/30/22 1045 12/01/22 0919 01/18/23 1421  NA 135  --  135 138 139  K 3.9  --  4.1 4.0 3.8  CL 102  --  102 108 104  CO2 22  --  23 22 24   GLUCOSE 124*  --  205* 140* 192*  BUN 16  --  11 14 12   CREATININE 1.12   < > 1.03 0.98 1.02  CALCIUM 9.4  --  9.7 9.8 9.7  GFRNONAA >60  --  >60 >60 >60  PROT 7.2  --  7.0  --  6.9  ALBUMIN 3.8  --  3.7  --  3.6  AST 13*  --  13*  --  11*  ALT 7  --  8  --  7  ALKPHOS 110  --  98  --  126  BILITOT 0.4  --  0.4  --  0.3   < > = values in this interval not displayed.    Latest Reference Range & Units 01/23/20 12:22 03/10/20 14:11 03/19/20 09:19 04/16/20  09:10 05/14/20 10:06 06/13/20 09:26 07/11/20 10:13 08/07/20 09:24 09/04/20 10:13 09/16/20 13:59 10/21/20 13:59 12/23/20 13:27  Prostatic Specific Antigen 0.00 - 4.00 ng/mL 986.00 (H) 34.69 (H) 21.59 (H) 2.68 1.21 0.71 0.68 0.41 0.36 0.28 0.29 0.28  (H): Data is abnormally high  RADIOGRAPHIC STUDIES: I have personally reviewed the radiological images as listed and agreed with the findings in the report. No results found.  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 OCT 1st, 2024-  Single focus of intense radiotracer activity in the posterior aspect of the RIGHT lobe of the prostate gland is concerning for local prostate adenocarcinoma recurrence;  Several foci of intense radiotracer activity within the lung parenchyma and pleural surfaces are concerning for metastatic prostate cancer. Some of the foci are difficult to define CT correlation.  Single focus of radiotracer activity in the anterior RIGHT lower rib is concerning for solitary rib metastasis.will ask radiology re: comparison to PSMA scan in 2022.   # continue X-tandi 120 mg/day-  Continue vitamin D.  Stable clinically, but with rising PSA-. Monitor for now. If worsened would recommend chemotherapy.   # Hot flashes- sec to ADT- monitor for now.   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  CT in  July-2024- Stable mixed lesion in the right upper lobe. Recommend continued surveillance. No new dominant mass, fluid collection or lymph node enlargement in the thorax. Stable- on most recent PET scan in OCT 2024.     # Chest tenderness-grade 1-2. Sec to ADT- continue tramadol prn-refiled; add vaseline BID- stable.   * eliagrd 45 11/12//2024.   # DISPOSITION:   # Eligard # Follow up in 2 month -  MD-;labs- cbc/cmp/PSA - Dr.B  # I reviewed the blood work- with the patient in detail; also reviewed the imaging independently [as summarized above]; and with the patient in detail.    All questions were answered. The patient knows to call the clinic with any problems, questions or concerns.    Earna Coder, MD 01/18/2023 3:34 PM

## 2023-01-18 NOTE — Telephone Encounter (Signed)
Called placed to radiology reading room. Pt had a PSMA PET on 12/16/22. Requested a comparison read on the 11/04/20 PSMA PET with an addendum added to current scan.

## 2023-01-18 NOTE — Telephone Encounter (Signed)
error 

## 2023-01-18 NOTE — Progress Notes (Signed)
PET 12/16/22.

## 2023-03-15 ENCOUNTER — Encounter: Payer: Self-pay | Admitting: Internal Medicine

## 2023-03-22 ENCOUNTER — Other Ambulatory Visit (HOSPITAL_COMMUNITY): Payer: Self-pay | Admitting: Internal Medicine

## 2023-03-22 ENCOUNTER — Inpatient Hospital Stay: Payer: Self-pay | Attending: Internal Medicine

## 2023-03-22 ENCOUNTER — Encounter: Payer: Self-pay | Admitting: Internal Medicine

## 2023-03-22 ENCOUNTER — Inpatient Hospital Stay (HOSPITAL_BASED_OUTPATIENT_CLINIC_OR_DEPARTMENT_OTHER): Payer: Self-pay | Admitting: Internal Medicine

## 2023-03-22 VITALS — BP 143/63 | HR 77 | Temp 97.6°F | Ht 72.0 in | Wt 213.2 lb

## 2023-03-22 DIAGNOSIS — C61 Malignant neoplasm of prostate: Secondary | ICD-10-CM

## 2023-03-22 DIAGNOSIS — C7800 Secondary malignant neoplasm of unspecified lung: Secondary | ICD-10-CM | POA: Insufficient documentation

## 2023-03-22 DIAGNOSIS — E119 Type 2 diabetes mellitus without complications: Secondary | ICD-10-CM | POA: Diagnosis not present

## 2023-03-22 DIAGNOSIS — F17210 Nicotine dependence, cigarettes, uncomplicated: Secondary | ICD-10-CM | POA: Diagnosis not present

## 2023-03-22 DIAGNOSIS — Z79899 Other long term (current) drug therapy: Secondary | ICD-10-CM | POA: Diagnosis not present

## 2023-03-22 DIAGNOSIS — Z7982 Long term (current) use of aspirin: Secondary | ICD-10-CM | POA: Insufficient documentation

## 2023-03-22 DIAGNOSIS — Z7984 Long term (current) use of oral hypoglycemic drugs: Secondary | ICD-10-CM | POA: Insufficient documentation

## 2023-03-22 DIAGNOSIS — C7951 Secondary malignant neoplasm of bone: Secondary | ICD-10-CM | POA: Diagnosis present

## 2023-03-22 LAB — CBC WITH DIFFERENTIAL (CANCER CENTER ONLY)
Abs Immature Granulocytes: 0.03 10*3/uL (ref 0.00–0.07)
Basophils Absolute: 0.1 10*3/uL (ref 0.0–0.1)
Basophils Relative: 1 %
Eosinophils Absolute: 0.2 10*3/uL (ref 0.0–0.5)
Eosinophils Relative: 2 %
HCT: 42.3 % (ref 39.0–52.0)
Hemoglobin: 13.3 g/dL (ref 13.0–17.0)
Immature Granulocytes: 0 %
Lymphocytes Relative: 32 %
Lymphs Abs: 3.2 10*3/uL (ref 0.7–4.0)
MCH: 27.3 pg (ref 26.0–34.0)
MCHC: 31.4 g/dL (ref 30.0–36.0)
MCV: 86.7 fL (ref 80.0–100.0)
Monocytes Absolute: 0.7 10*3/uL (ref 0.1–1.0)
Monocytes Relative: 7 %
Neutro Abs: 6 10*3/uL (ref 1.7–7.7)
Neutrophils Relative %: 58 %
Platelet Count: 352 10*3/uL (ref 150–400)
RBC: 4.88 MIL/uL (ref 4.22–5.81)
RDW: 15 % (ref 11.5–15.5)
WBC Count: 10.2 10*3/uL (ref 4.0–10.5)
nRBC: 0 % (ref 0.0–0.2)

## 2023-03-22 LAB — CMP (CANCER CENTER ONLY)
ALT: 8 U/L (ref 0–44)
AST: 14 U/L — ABNORMAL LOW (ref 15–41)
Albumin: 3.9 g/dL (ref 3.5–5.0)
Alkaline Phosphatase: 110 U/L (ref 38–126)
Anion gap: 11 (ref 5–15)
BUN: 13 mg/dL (ref 8–23)
CO2: 24 mmol/L (ref 22–32)
Calcium: 9.8 mg/dL (ref 8.9–10.3)
Chloride: 101 mmol/L (ref 98–111)
Creatinine: 0.97 mg/dL (ref 0.61–1.24)
GFR, Estimated: >60 mL/min (ref >60)
Glucose, Bld: 161 mg/dL — ABNORMAL HIGH (ref 70–99)
Potassium: 3.9 mmol/L (ref 3.5–5.1)
Sodium: 136 mmol/L (ref 135–145)
Total Bilirubin: 0.5 mg/dL (ref 0.0–1.2)
Total Protein: 7.3 g/dL (ref 6.5–8.1)

## 2023-03-22 MED ORDER — ENZALUTAMIDE 40 MG PO TABS: 120.0000 mg | ORAL_TABLET | Freq: Every day | ORAL | 3 refills | Status: DC

## 2023-03-22 NOTE — Assessment & Plan Note (Addendum)
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 Some of the foci are difficult to define CT correlation.  Single focus of radiotracer activity in the anterior RIGHT lower rib is concerning for solitary rib metastasis.will ask radiology re: comparison to PSMA scan in 2022.  In comparison to PSMA PET scan 11/04/2020, all the radiotracer avid lesions are new from prior;  Cystic pulmonary lesion in the RIGHT upper lobe does not have radiotracer activity and is increased in size from 2022.   # Patient currently on X-tandi 120 mg/day.  I at length discussed with patient that PET scan shows progression-although Lesions are subcentimeter in size; also PSA is rising.  Discussed given progression could consider subsequent lines of therapy including-chemotherapy Taxotere /Pluvicto.  Patient is a borderline candidate for Taxotere .  Send a message to Dr. Jackquline regarding Pluvicto option.  # Hot flashes- sec to ADT- monitor for now.   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  CT in  July-2024- Stable mixed lesion in the right upper lobe. Recommend continued surveillance. No new dominant mass, fluid collection or lymph node enlargement in the thorax. Stable- on most recent PET scan in OCT 2024.     # Chest tenderness-grade 1-2. Sec to ADT- continue tramadol  prn-refiled; add vaseline BID- stable.   * eliagrd 45 11/12//2024.   # DISPOSITION:   # Follow up in 2 month - MD-;labs- cbc/cmp/PSA - Dr.B  # I reviewed the blood work- with the patient in detail; also reviewed  the imaging independently [as summarized above]; and with the patient in detail.

## 2023-03-22 NOTE — Progress Notes (Signed)
 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   # NOV 2021- CTA [ER] Diffuse bilateral pulmonary nodules including a cavitary lesion in the right upper lobe measuring approximately 2 cm. There is extensive nodular pleural thickening bilaterally, greatest in the left lower lobe.  November 2021 PET scan-multiple lung lesions multiple bone lesions and prostate uptake.  February 2022-prostate biopsy [Dr.Brandon]   # DM-2; NO COPD [?]; ACTIVE SMOKER 65ppd.  # DEC 2nd week- FIRMAGON ; # JAN 12TH, 2022-ZYTIGA   1000 MG+ PRED 5MG ; FEB MID 2022- ELIGARD  q6M; MARCH 2022- DISCONTINUE ZYTIGA  sec to PRESS [severe hypokalemia/mental status changes hospitalization];  #   # MAY 6th, 2022- XTnadi 3pills/day   # NGS/MOLECULAR TESTS: MARCH 2022-foundation 1 no targetable mutation./Genetic evaluation negative    # PALLIATIVE CARE EVALUATION:  # PAIN MANAGEMENT:    DIAGNOSIS: Prostate cancer  STAGE:   IV      ;  GOALS: Palliative  CURRENT/MOST RECENT THERAPY : ADT+ Xtandi     Prostate cancer metastatic to multiple sites [facility])  02/04/2020 Initial Diagnosis   Prostate cancer metastatic to multiple sites [facility])   03/19/2020 Cancer Staging   Staging form: Prostate, AJCC 8th Edition - Clinical: Stage IVB (pM1b) - Signed by Rennie Cindy SAUNDERS, MD on 03/19/2020    Genetic Testing   Negative genetic testing. No pathogenic variants identified on the [facility] Multi-Cancer Panel. VUS in AXIN2 called c.1235A>C identified. The report date is 03/23/2020.   The Multi-Cancer Panel offered by [facility] includes sequencing and/or deletion duplication testing of the  following 84 genes: AIP, ALK, APC, ATM, AXIN2,BAP1,  BARD1, BLM, BMPR1A, BRCA1, BRCA2, BRIP1, CASR, CDC73, CDH1, CDK4, CDKN1B, CDKN1C, CDKN2A (p14ARF), CDKN2A (p16INK4a), CEBPA, CHEK2, CTNNA1, DICER1, DIS3L2, EGFR (c.2369C>T, p.Thr790Met variant only), EPCAM (Deletion/duplication testing only), FH, FLCN, GATA2, GPC3, GREM1 (Promoter region deletion/duplication testing only), HOXB13 (c.251G>A, p.Gly84Glu), HRAS, KIT, MAX, MEN1, MET, MITF (c.952G>A, p.Glu318Lys variant only), MLH1, MSH2, MSH3, MSH6, MUTYH, NBN, NF1, NF2, NTHL1, PALB2, PDGFRA, PHOX2B, PMS2, POLD1, POLE, POT1, PRKAR1A, PTCH1, PTEN, RAD50, RAD51C, RAD51D, RB1, RECQL4, RET, RUNX1, SDHAF2, SDHA (sequence changes only), SDHB, SDHC, SDHD, SMAD4, SMARCA4, SMARCB1, SMARCE1, STK11, SUFU, TERC, TERT, TMEM127, TP53, TSC1, TSC2, VHL, WRN and WT1.      HISTORY OF PRESENTING ILLNESS: Ambulating independently. alone  DHRUVAN GULLION 83 y.o.  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   No falls.  He states that he is compliant with his oral Xtandi . No fever no chills.  No constipation no diarrhea.  No nausea no vomiting. Denies any worsening pain.   Review of Systems  Constitutional:  Positive for malaise/fatigue. Negative for chills, diaphoresis and fever.  HENT:  Negative for nosebleeds and sore throat.   Eyes:  Negative for double vision.  Respiratory:  Negative for hemoptysis, sputum production, shortness of breath and wheezing.   Cardiovascular:  Negative for chest pain, palpitations, orthopnea and leg swelling.  Gastrointestinal:  Negative for blood in stool, constipation, diarrhea, heartburn, melena, nausea and vomiting.  Genitourinary:  Negative for dysuria, frequency and urgency.  Musculoskeletal:  Positive for back pain and myalgias. Negative for joint pain.  Skin: Negative.  Negative for itching and rash.  Neurological:  Negative for  dizziness, tingling, focal weakness, weakness and headaches.  Endo/Heme/Allergies:  Does not bruise/bleed easily.  Psychiatric/Behavioral:  Negative for depression. The patient is not nervous/anxious and does not have insomnia.      MEDICAL HISTORY:  Past Medical History:  Diagnosis Date   Arthritis    BPH (benign prostatic hyperplasia)    BPH with obstruction/lower urinary tract symptoms    Diabetes (HCC)    Disorder resulting from impaired renal function    Elevated PSA    Family history of lung cancer    Family history of lymphoma    Heart murmur    Hypertension    Knee pain    Lesion of lung    Low HDL (under 40)    Obesity    Urinary hesitancy     SURGICAL HISTORY: Past Surgical History:  Procedure Laterality Date   HERNIA REPAIR  2012   Umbilical Hernia Repair   KNEE ARTHROSCOPY Left 04/10/2015   Procedure: ARTHROSCOPY KNEE, partial medial meniscectomy, partial synovectomy;  Surgeon: Ozell Flake, MD;  Location: [facility] ORS;  Service: Orthopedics;  Laterality: Left;    SOCIAL HISTORY: Social History   Socioeconomic History   Marital status: Widowed    Spouse name: Not on file   Number of children: Not on file   Years of education: Not on file   Highest education level: Not on file  Occupational History   Not on file  Tobacco Use   Smoking status: Every Day    Current packs/day: 1.00    Types: Cigarettes   Smokeless tobacco: Never  Vaping Use   Vaping status: Never Used  Substance and Sexual Activity   Alcohol use: No    Alcohol/week: 0.0 standard drinks of alcohol   Drug use: No   Sexual activity: Not Currently  Other Topics Concern   Not on file  Social History Narrative   Lives in [place]; self; daughter lives 1 mile. Smoke 1ppd > 65 years; stopped 25 years ago.  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    Social Drivers of Corporate Investment Banker Strain: Not on file  Food Insecurity: Not on file  Transportation Needs:  Not on file  Physical Activity: Not on file  Stress: Not on file  Social Connections: Not on file  Intimate Partner Violence: Not on file    FAMILY HISTORY: Family History  Problem Relation Age of Onset   Diabetes Mother    Lung cancer Mother    Lymphoma Son 28       died in 38.    Kidney disease Neg Hx    Prostate cancer Neg Hx     ALLERGIES:  has no known allergies.  MEDICATIONS:  Current Outpatient Medications  Medication Sig Dispense Refill   acetaminophen  (TYLENOL ) 650 MG CR tablet Take 650 mg by mouth every 8 (eight) hours as needed for pain.     albuterol  (PROVENTIL  HFA;VENTOLIN  HFA) 108 (90 BASE) MCG/ACT inhaler Inhale 2 puffs into the lungs every 6 (six) hours as needed for wheezing or shortness of breath.     aspirin  81 MG tablet Take 81 mg by mouth daily.     cyanocobalamin  2000 MCG tablet Take 1 tablet (2,000 mcg total) by mouth daily. 90 tablet 1   finasteride  (PROSCAR ) 5 MG tablet TAKE 1 TABLET BY MOUTH EVERY DAY (Patient taking differently: Take 5 mg by mouth daily.) 90 tablet 3  furosemide  (LASIX ) 20 MG tablet Take 1 tablet (20 mg total) by mouth daily. 15 tablet 0   GLUMETZA 1000 MG 24 hr tablet Take 1,000 mg by mouth 2 (two) times daily.     metFORMIN (GLUCOPHAGE) 1000 MG tablet Take 1,000 mg by mouth 2 (two) times daily with a meal.     rosuvastatin  (CRESTOR ) 20 MG tablet Take 20 mg by mouth every morning.     simvastatin  (ZOCOR ) 40 MG tablet Take 40 mg by mouth daily.     thiamine  100 MG tablet TAKE 1 TABLET BY MOUTH EVERY DAY 90 tablet 1   traMADol  (ULTRAM ) 50 MG tablet Take 1 tablet (50 mg total) by mouth daily as needed. 60 tablet 0   enzalutamide  (XTANDI ) 40 MG tablet Take 3 tablets (120 mg total) by mouth daily. 90 tablet 3   No current facility-administered medications for this visit.      SABRA  PHYSICAL EXAMINATION: ECOG PERFORMANCE STATUS: 0 - Asymptomatic  Vitals:   03/22/23 1434 03/22/23 1442  BP: (!) 152/66 (!) 143/63  Pulse: 77   Temp:  97.6 F (36.4 C)   SpO2: 100%        Filed Weights   03/22/23 1434  Weight: 213 lb 3.2 oz (96.7 kg)       Physical Exam HENT:     Head: Normocephalic and atraumatic.     Mouth/Throat:     Pharynx: No oropharyngeal exudate.  Eyes:     Pupils: Pupils are equal, round, and reactive to light.  Cardiovascular:     Rate and Rhythm: Normal rate and regular rhythm.  Pulmonary:     Effort: No respiratory distress.     Breath sounds: No wheezing.     Comments: Decreased breath sounds bilaterally. Abdominal:     General: Bowel sounds are normal. There is no distension.     Palpations: Abdomen is soft. There is no mass.     Tenderness: There is no abdominal tenderness. There is no guarding or rebound.  Musculoskeletal:        General: No tenderness. Normal range of motion.     Cervical back: Normal range of motion and neck supple.  Skin:    General: Skin is warm.  Neurological:     Mental Status: He is alert and oriented to person, place, and time.  Psychiatric:        Mood and Affect: Affect normal.      LABORATORY DATA:  I have reviewed the data as listed Lab Results  Component Value Date   WBC 10.2 03/22/2023   HGB 13.3 03/22/2023   HCT 42.3 03/22/2023   MCV 86.7 03/22/2023   PLT 352 03/22/2023   Recent Labs    09/30/22 1045 12/01/22 0919 01/18/23 1421 03/22/23 1430  NA 135 138 139 136  K 4.1 4.0 3.8 3.9  CL 102 108 104 101  CO2 23 22 24 24   GLUCOSE 205* 140* 192* 161*  BUN 11 14 12 13   CREATININE 1.03 0.98 1.02 0.97  CALCIUM  9.7 9.8 9.7 9.8  GFRNONAA >60 >60 >60 >60  PROT 7.0  --  6.9 7.3  ALBUMIN 3.7  --  3.6 3.9  AST 13*  --  11* 14*  ALT 8  --  7 8  ALKPHOS 98  --  126 110  BILITOT 0.4  --  0.3 0.5    Latest Reference Range & Units 01/23/20 12:22 03/10/20 14:11 03/19/20 09:19 04/16/20 09:10 05/14/20 10:06 06/13/20 09:26 07/11/20  10:13 08/07/20 09:24 09/04/20 10:13 09/16/20 13:59 10/21/20 13:59 12/23/20 13:27  Prostatic Specific Antigen 0.00 -  4.00 ng/mL 986.00 (H) 34.69 (H) 21.59 (H) 2.68 1.21 0.71 0.68 0.41 0.36 0.28 0.29 0.28  (H): Data is abnormally high  RADIOGRAPHIC STUDIES: I have personally reviewed the radiological images as listed and agreed with the findings in the report. No results found.  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 Some of the foci are difficult to define CT correlation.  Single focus of radiotracer activity in the anterior RIGHT lower rib is concerning for solitary rib metastasis.will ask radiology re: comparison to PSMA scan in 2022.  In comparison to PSMA PET scan 11/04/2020, all the radiotracer avid lesions are new from prior;  Cystic pulmonary lesion in the RIGHT upper lobe does not have radiotracer activity and is increased in size from 2022.   # Patient currently on X-tandi 120 mg/day.  I at length discussed with patient that PET scan shows progression-although Lesions are subcentimeter in size; also PSA is rising.  Discussed given progression could consider subsequent lines of therapy including-chemotherapy Taxotere /Pluvicto.  Patient is a borderline candidate for Taxotere .  Send a message to Dr. Jackquline regarding Pluvicto option.  # Hot flashes- sec to ADT- monitor for now.   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  CT in  July-2024- Stable mixed lesion in the right upper lobe. Recommend continued surveillance. No new dominant mass, fluid collection or lymph node enlargement  in the thorax. Stable- on most recent PET scan in OCT 2024.     # Chest tenderness-grade 1-2. Sec to ADT- continue tramadol  prn-refiled; add vaseline BID- stable.   * eliagrd 45 11/12//2024.   # DISPOSITION:   # Follow up in 2 month - MD-;labs- cbc/cmp/PSA - Dr.B  # I reviewed the blood work- with the patient in detail; also reviewed the imaging independently [as summarized above]; and with the patient in detail.    All questions were answered. The patient knows to call the clinic with any problems, questions or concerns.    Cindy JONELLE Joe, MD 03/22/2023 3:22 PM

## 2023-03-22 NOTE — Progress Notes (Signed)
 Refill xtandi, pended.

## 2023-03-23 LAB — PSA: Prostatic Specific Antigen: 3.03 ng/mL (ref 0.00–4.00)

## 2023-04-07 ENCOUNTER — Observation Stay
Admission: EM | Admit: 2023-04-07 | Discharge: 2023-04-09 | Disposition: A | Discharge status: Home / Self Care | Payer: Medicare HMO | Attending: Internal Medicine | Admitting: Internal Medicine

## 2023-04-07 ENCOUNTER — Encounter: Payer: Self-pay | Admitting: Intensive Care

## 2023-04-07 ENCOUNTER — Other Ambulatory Visit: Payer: Self-pay

## 2023-04-07 ENCOUNTER — Emergency Department: Payer: Medicare HMO

## 2023-04-07 DIAGNOSIS — R41841 Cognitive communication deficit: Secondary | ICD-10-CM | POA: Insufficient documentation

## 2023-04-07 DIAGNOSIS — E119 Type 2 diabetes mellitus without complications: Secondary | ICD-10-CM | POA: Diagnosis not present

## 2023-04-07 DIAGNOSIS — R200 Anesthesia of skin: Principal | ICD-10-CM

## 2023-04-07 DIAGNOSIS — I10 Essential (primary) hypertension: Secondary | ICD-10-CM | POA: Insufficient documentation

## 2023-04-07 DIAGNOSIS — C61 Malignant neoplasm of prostate: Secondary | ICD-10-CM | POA: Insufficient documentation

## 2023-04-07 DIAGNOSIS — Z7982 Long term (current) use of aspirin: Secondary | ICD-10-CM | POA: Diagnosis not present

## 2023-04-07 DIAGNOSIS — F17210 Nicotine dependence, cigarettes, uncomplicated: Secondary | ICD-10-CM | POA: Diagnosis not present

## 2023-04-07 DIAGNOSIS — I6381 Other cerebral infarction due to occlusion or stenosis of small artery: Principal | ICD-10-CM | POA: Insufficient documentation

## 2023-04-07 DIAGNOSIS — Z7984 Long term (current) use of oral hypoglycemic drugs: Secondary | ICD-10-CM | POA: Diagnosis not present

## 2023-04-07 DIAGNOSIS — Z79899 Other long term (current) drug therapy: Secondary | ICD-10-CM | POA: Diagnosis not present

## 2023-04-07 DIAGNOSIS — I639 Cerebral infarction, unspecified: Secondary | ICD-10-CM | POA: Diagnosis present

## 2023-04-07 LAB — COMPREHENSIVE METABOLIC PANEL
ALT: 7 U/L (ref 0–44)
AST: 10 U/L — ABNORMAL LOW (ref 15–41)
Albumin: 3.6 g/dL (ref 3.5–5.0)
Alkaline Phosphatase: 92 U/L (ref 38–126)
Anion gap: 10 (ref 5–15)
BUN: 16 mg/dL (ref 8–23)
CO2: 23 mmol/L (ref 22–32)
Calcium: 9.3 mg/dL (ref 8.9–10.3)
Chloride: 107 mmol/L (ref 98–111)
Creatinine, Ser: 0.98 mg/dL (ref 0.61–1.24)
GFR, Estimated: >60 mL/min (ref >60)
Glucose, Bld: 106 mg/dL — ABNORMAL HIGH (ref 70–99)
Potassium: 4 mmol/L (ref 3.5–5.1)
Sodium: 140 mmol/L (ref 135–145)
Total Bilirubin: 0.6 mg/dL (ref 0.0–1.2)
Total Protein: 7 g/dL (ref 6.5–8.1)

## 2023-04-07 LAB — DIFFERENTIAL
Abs Immature Granulocytes: 0.03 10*3/uL (ref 0.00–0.07)
Basophils Absolute: 0.1 10*3/uL (ref 0.0–0.1)
Basophils Relative: 1 %
Eosinophils Absolute: 0.3 10*3/uL (ref 0.0–0.5)
Eosinophils Relative: 3 %
Immature Granulocytes: 0 %
Lymphocytes Relative: 36 %
Lymphs Abs: 3.8 10*3/uL (ref 0.7–4.0)
Monocytes Absolute: 0.8 10*3/uL (ref 0.1–1.0)
Monocytes Relative: 8 %
Neutro Abs: 5.6 10*3/uL (ref 1.7–7.7)
Neutrophils Relative %: 52 %

## 2023-04-07 LAB — CBC
HCT: 38.5 % — ABNORMAL LOW (ref 39.0–52.0)
Hemoglobin: 12.4 g/dL — ABNORMAL LOW (ref 13.0–17.0)
MCH: 28.1 pg (ref 26.0–34.0)
MCHC: 32.2 g/dL (ref 30.0–36.0)
MCV: 87.3 fL (ref 80.0–100.0)
Platelets: 385 10*3/uL (ref 150–400)
RBC: 4.41 MIL/uL (ref 4.22–5.81)
RDW: 15.2 % (ref 11.5–15.5)
WBC: 10.5 10*3/uL (ref 4.0–10.5)
nRBC: 0 % (ref 0.0–0.2)

## 2023-04-07 LAB — ETHANOL: Alcohol, Ethyl (B): <10 mg/dL (ref <10)

## 2023-04-07 LAB — PROTIME-INR
INR: 1 (ref 0.8–1.2)
Prothrombin Time: 13.2 s (ref 11.4–15.2)

## 2023-04-07 LAB — APTT: aPTT: 31 s (ref 24–36)

## 2023-04-07 MED ORDER — IOHEXOL 350 MG/ML SOLN
75.0000 mL | Freq: Once | INTRAVENOUS | Status: AC | PRN
  Administered 2023-04-07: 75 mL via INTRAVENOUS

## 2023-04-07 NOTE — ED Triage Notes (Signed)
Patient c/o numbness/tingling in left jaw, lip and hand/ar, that started 04/05/23 Tuesday upon awakening

## 2023-04-07 NOTE — ED Provider Triage Note (Signed)
Emergency Medicine Provider Triage Evaluation Note  Brent Glass , a 83 y.o. male  was evaluated in triage.  Pt complains of numbness and tingling on the left jaw and hand. He awakened with symptoms 2 days ago. Symptoms continue.  Physical Exam  There were no vitals taken for this visit. Gen:   Awake, no distress   Resp:  Normal effort  MSK:   Moves extremities without difficulty  Other:    Medical Decision Making  Medically screening exam initiated at 3:59 PM.  Appropriate orders placed.  Brent Glass was informed that the remainder of the evaluation will be completed by another provider, this initial triage assessment does not replace that evaluation, and the importance of remaining in the ED until their evaluation is complete.  CVA protocol started however patient is without side of the window to activate code stroke.   Chinita Pester, FNP 04/07/23 [phone]

## 2023-04-07 NOTE — ED Provider Notes (Addendum)
[facility] Provider Note    Event Date/Time   First MD Initiated Contact with Patient 04/07/23 2205     (approximate)   History   Numbness   HPI  Brent Glass is a 83 y.o. male with history of metastatic prostate cancer who comes in with concern for left facial numbness.  Patient reports numbness and tingling on the left jaw on the left hand going a little bit up the arm but not fully.  He denies ever having this previously.  He states that it comes and goes over the past 2 days but has never gone completely back to normal.  He denies ever having anything like this previously.  He is got no weakness.  Denies any new chest pain, shortness of breath or any other concerns  Physical Exam   Triage Vital Signs: ED Triage Vitals  Encounter Vitals Group     BP 04/07/23 1559 (!) 166/78     Systolic BP Percentile --      Diastolic BP Percentile --      Pulse Rate 04/07/23 1559 71     Resp 04/07/23 1559 18     Temp 04/07/23 1559 98.4 F (36.9 C)     Temp Source 04/07/23 1559 Oral     SpO2 04/07/23 1559 96 %     Weight 04/07/23 1600 213 lb (96.6 kg)     Height 04/07/23 1600 6' (1.829 m)     Head Circumference --      Peak Flow --      Pain Score 04/07/23 1559 0     Pain Loc --      Pain Education --      Exclude from Growth Chart --     Most recent vital signs: Vitals:   04/07/23 1559  BP: (!) 166/78  Pulse: 71  Resp: 18  Temp: 98.4 F (36.9 C)  SpO2: 96%     General: Awake, no distress.  CV:  Good peripheral perfusion.  Resp:  Normal effort.  Abd:  No distention.  Soft and nontender Other:  Sensation changes on the left face left hand   ED Results / Procedures / Treatments   Labs (all labs ordered are listed, but only abnormal results are displayed) Labs Reviewed  CBC - Abnormal; Notable for the following components:      Result Value   Hemoglobin 12.4 (*)    HCT 38.5 (*)    All other components within normal limits   COMPREHENSIVE METABOLIC PANEL - Abnormal; Notable for the following components:   Glucose, Bld 106 (*)    AST 10 (*)    All other components within normal limits  PROTIME-INR  APTT  DIFFERENTIAL  ETHANOL     EKG  My interpretation of EKG:  Normal sinus rate 68 without any ST elevation or T wave inversions, normal intervals  RADIOLOGY I have reviewed the CT head personally interpreted no evidence of intracranial hemorrhage  PROCEDURES:  Critical Care performed: No  Procedures   MEDICATIONS ORDERED IN ED: Medications - No data to display   IMPRESSION / MDM / ASSESSMENT AND PLAN / ED COURSE  I reviewed the triage vital signs and the nursing notes.   Patient's presentation is most consistent with acute presentation with potential threat to life or bodily function.   Patient comes in with left-sided face and arm tingling.  Patient has a history of prostate cancer will get MRI to evaluate for mets, stroke as  well as CT angio given the intermittent nature to make sure no significant stenosis that could be causing TIAs although he denies full resolution just that it lessens.. STROKE CODE not called given out of window. LLK was 2 days ago.   Ethanol was negative.  Coags normal.  CBC shows hemoglobin around baseline of 12.4.  CMP reassuring  Patient handed off pending imaging        FINAL CLINICAL IMPRESSION(S) / ED DIAGNOSES   Final diagnoses:  Numbness     Rx / DC Orders   ED Discharge Orders     None        Note:  This document was prepared using Dragon voice recognition software and may include unintentional dictation errors.   Concha Se, MD 04/07/23 2237    Concha Se, MD 04/07/23 [phone]

## 2023-04-08 ENCOUNTER — Observation Stay
Admit: 2023-04-08 | Discharge: 2023-04-08 | Disposition: A | Discharge status: Home / Self Care | Payer: Medicare HMO | Attending: Internal Medicine

## 2023-04-08 DIAGNOSIS — I639 Cerebral infarction, unspecified: Secondary | ICD-10-CM

## 2023-04-08 LAB — ECHOCARDIOGRAM COMPLETE
AR max vel: 2.22 cm2
AV Area VTI: 2.61 cm2
AV Area mean vel: 2.41 cm2
AV Mean grad: 6 mm[Hg]
AV Peak grad: 13.5 mm[Hg]
Ao pk vel: 1.84 m/s
Area-P 1/2: 2.32 cm2
Calc EF: 58.8 %
Height: 72 in
MV VTI: 3.37 cm2
S' Lateral: 3.3 cm
Single Plane A2C EF: 63.1 %
Single Plane A4C EF: 52.5 %
Weight: 3408 [oz_av]

## 2023-04-08 LAB — CBG MONITORING, ED
Glucose-Capillary: 168 mg/dL — ABNORMAL HIGH (ref 70–99)
Glucose-Capillary: 107 mg/dL — ABNORMAL HIGH (ref 70–99)
Glucose-Capillary: 108 mg/dL — ABNORMAL HIGH (ref 70–99)

## 2023-04-08 LAB — GLUCOSE, CAPILLARY: Glucose-Capillary: 133 mg/dL — ABNORMAL HIGH (ref 70–99)

## 2023-04-08 LAB — HEMOGLOBIN A1C
Hgb A1c MFr Bld: 6.9 % — ABNORMAL HIGH (ref 4.8–5.6)
Mean Plasma Glucose: 151.33 mg/dL

## 2023-04-08 LAB — LIPID PANEL
Cholesterol: 242 mg/dL — ABNORMAL HIGH (ref 0–200)
HDL: 51 mg/dL (ref >40)
LDL Cholesterol: 154 mg/dL — ABNORMAL HIGH (ref 0–99)
Total CHOL/HDL Ratio: 4.7 {ratio}
Triglycerides: 184 mg/dL — ABNORMAL HIGH (ref <150)
VLDL: 37 mg/dL (ref 0–40)

## 2023-04-08 MED ORDER — ACETAMINOPHEN 325 MG PO TABS: 650.0000 mg | ORAL_TABLET | ORAL | Status: DC | PRN

## 2023-04-08 MED ORDER — INSULIN ASPART 100 UNIT/ML IJ SOLN
0.0000 [IU] | Freq: Three times a day (TID) | INTRAMUSCULAR | Status: DC
Start: 2023-04-08 — End: 2023-04-09
  Administered 2023-04-08: 3 [IU] via SUBCUTANEOUS
  Administered 2023-04-09: 2 [IU] via SUBCUTANEOUS
  Filled 2023-04-08 (×2): qty 1

## 2023-04-08 MED ORDER — ACETAMINOPHEN 160 MG/5ML PO SOLN: 650.0000 mg | ORAL | Status: DC | PRN

## 2023-04-08 MED ORDER — ACETAMINOPHEN 650 MG RE SUPP: 650.0000 mg | RECTAL | Status: DC | PRN

## 2023-04-08 MED ORDER — INSULIN ASPART 100 UNIT/ML IJ SOLN: 0.0000 [IU] | Freq: Every day | INTRAMUSCULAR | Status: DC

## 2023-04-08 MED ORDER — CLOPIDOGREL BISULFATE 75 MG PO TABS
75.0000 mg | ORAL_TABLET | Freq: Every day | ORAL | Status: DC
  Administered 2023-04-08 – 2023-04-09 (×2): 75 mg via ORAL
  Filled 2023-04-08 (×2): qty 1

## 2023-04-08 MED ORDER — ASPIRIN 81 MG PO TBEC
81.0000 mg | DELAYED_RELEASE_TABLET | Freq: Every day | ORAL | Status: DC
  Administered 2023-04-08 – 2023-04-09 (×2): 81 mg via ORAL
  Filled 2023-04-08 (×2): qty 1

## 2023-04-08 MED ORDER — ENOXAPARIN SODIUM 40 MG/0.4ML IJ SOSY
40.0000 mg | PREFILLED_SYRINGE | INTRAMUSCULAR | Status: DC
  Administered 2023-04-08 – 2023-04-09 (×2): 40 mg via SUBCUTANEOUS
  Filled 2023-04-08 (×2): qty 0.4

## 2023-04-08 MED ORDER — FINASTERIDE 5 MG PO TABS
5.0000 mg | ORAL_TABLET | Freq: Every day | ORAL | Status: DC
  Administered 2023-04-08 – 2023-04-09 (×2): 5 mg via ORAL
  Filled 2023-04-08 (×2): qty 1

## 2023-04-08 MED ORDER — ENZALUTAMIDE 40 MG PO TABS: 120.0000 mg | ORAL_TABLET | Freq: Every day | ORAL | Status: DC

## 2023-04-08 MED ORDER — STROKE: EARLY STAGES OF RECOVERY BOOK: Freq: Once | Status: AC

## 2023-04-08 MED ORDER — SIMVASTATIN 20 MG PO TABS
40.0000 mg | ORAL_TABLET | Freq: Every day | ORAL | Status: DC
  Administered 2023-04-08 – 2023-04-09 (×2): 40 mg via ORAL
  Filled 2023-04-08: qty 2
  Filled 2023-04-08: qty 4

## 2023-04-08 MED ORDER — ALBUTEROL SULFATE (2.5 MG/3ML) 0.083% IN NEBU: 2.5000 mg | INHALATION_SOLUTION | Freq: Four times a day (QID) | RESPIRATORY_TRACT | Status: DC | PRN

## 2023-04-08 MED ORDER — GADOBUTROL 1 MMOL/ML IV SOLN
10.0000 mL | Freq: Once | INTRAVENOUS | Status: AC | PRN
  Administered 2023-04-08: 10 mL via INTRAVENOUS

## 2023-04-08 NOTE — ED Notes (Signed)
 Echo at bedside

## 2023-04-08 NOTE — Evaluation (Addendum)
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   Acute confusion 05/27/2020   Left-sided weakness 05/27/2020   Diabetes mellitus without complication (HCC) 05/27/2020   HTN (hypertension) 05/27/2020   Acute CVA (cerebrovascular accident) (HCC) 05/27/2020   Genetic testing 03/26/2020   Family history of lung cancer    Family history of lymphoma    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) 02/04/2020   Goals of care, counseling/discussion 02/04/2020   Multiple lung nodules 01/23/2020   BPH with obstruction/lower urinary tract symptoms 11/21/2014   Elevated PSA 11/21/2014   Past Medical History:  Past Medical History:  Diagnosis Date   Arthritis    BPH (benign prostatic hyperplasia)    BPH with obstruction/lower urinary tract symptoms    Diabetes (HCC)    Disorder resulting from impaired renal function    Elevated PSA    Family history of lung cancer    Family history of lymphoma    Heart murmur    Hypertension    Knee pain    Lesion of lung    Low HDL (under 40)    Obesity    Urinary hesitancy    Past Surgical History:  Past Surgical History:  Procedure Laterality Date   HERNIA REPAIR  2012   Umbilical Hernia Repair   KNEE ARTHROSCOPY Left 04/10/2015   Procedure: ARTHROSCOPY KNEE, partial medial meniscectomy, partial synovectomy;  Surgeon: Kennedy Bucker, MD;  Location: [facility] ORS;  Service: Orthopedics;  Laterality: Left;   HPI:  Pt is a 83 y.o. male with medical history significant for Obesity, non-insulin-dependent diabetes mellitus, hypertension, chronic pain, metastatic prostate cancer, who  presents to the ED with a 2-day history of intermittent numbness left face and left arm.  Denies weakness.  Was hospitalized in 2021 with transient neurologic deficit and rule out for stroke.  He was previously in his usual state of health.  Pt stated he lives alone w/out immediate family to assist. OF NOTE: pt stated he "stopped taking my aspirin b/c my Oncology MD told me too".   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    Assessment / Plan / Recommendation Clinical Impression   Pt seen today for informal cognitive and language assessment at bedside. Pt awake, verbal. Resting in bed in ED Hallway. Pt followed instructions in helping to sit upright for session. He indicated wants/needs appropriately.  Pt stated the "numbness" has resolved.  On RA, afebrile. WBC wnl.  Pt appears to present with [facility] cognitive communication in general conversation and tasks. This was characterized by abilities/strengths in the areas of attention, general recall of morning/events brining him into the hospital, and mental flexibility w/ higher level tasks of basic tasks such as items needed for/order of making a sandwich, then buying groceries and needing change from a $20. Pt was able to ID problem situations and emergencies in which he would call "911". Verbal language tasks appeared WFL: repetition, automatics, and naming WFL; pt conversed at conversation level re: himself. He indicated that he lived alone and did NOT have help in the home. No overt receptive deficits noted: y/n questions and 2-step commands were [facility]. No overt motor  speech deficits: phonation and resonance was [facility]. Speech was fully intelligible in setting of Missing Dentition(which can impact precision of articulation of speech).  OF NOTE: pt denied any swallowing issues; no deficits reported at the breakfast meal this morning by NSG nor w/ meds w/ water.   Suspect pt could be close to/at his previous communication Baseline. No  further Acute ST services indicated currently but f/u with ST services in the Outpatient setting if change from Baseline is determined post Discharge home. Pt and MD in agreement w/ plan.     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  Recommendations may be updated based on patient status, additional functional criteria and insurance authorization.    Follow Up Recommendations  No SLP follow up    Assistance Recommended at Discharge  PRN  Functional Status Assessment Patient has not had a recent decline in their functional status  Frequency and Duration  (n/a)   (n/a)      SLP Evaluation Cognition  Overall Cognitive Status: Within Functional Limits for tasks assessed Arousal/Alertness: Awake/alert Orientation Level: Oriented X4 Year: 2025 Month: January Attention: Focused;Sustained Focused Attention: Appears intact Sustained Attention: Appears intact Memory: Appears intact (for meal items, simple problem solving) Awareness: Appears intact Problem Solving: Appears intact (basic) Executive Function: Decision Making Decision Making: Appears intact ("911" for emergencies) Behaviors:  (none) Safety/Judgment: Appears intact Comments: id'd emergency situations x2 and need for 911       Comprehension  Auditory Comprehension Overall Auditory Comprehension: Appears within functional limits for tasks assessed Yes/No Questions: Within Functional Limits Commands: Within Functional Limits Conversation: Simple Interfering Components:  (none) Visual Recognition/Discrimination Discrimination: Not tested Reading Comprehension Reading Status: Not tested    Expression Expression Primary Mode of  Expression: Verbal Verbal Expression Overall Verbal Expression: Appears within functional limits for tasks assessed Initiation: No impairment Automatic Speech:  (wfl) Level of Generative/Spontaneous Verbalization: Conversation Repetition: No impairment Naming: No impairment Pragmatics: No impairment Interfering Components:  (Missing Dentition) Non-Verbal Means of Communication: Not applicable Written Expression Written Expression: Not tested   Oral / Motor  Oral Motor/Sensory Function Overall Oral Motor/Sensory Function: Within functional limits Motor Speech Overall Motor Speech: Appears within functional limits for tasks assessed Respiration: Within functional limits Phonation: Normal Resonance: Within functional limits Articulation: Within functional limitis Intelligibility: Intelligible Motor Planning: Witnin functional limits Motor Speech Errors: Not applicable              Jerilynn Som, MS, CCC-SLP Speech Language Pathologist Rehab Services; [facility] [phone] (ascom) Makiyla Linch 04/08/2023, 12:33 PM

## 2023-04-08 NOTE — H&P (Signed)
History and Physical    Patient: Brent Glass:[id number] DOB: Feb 19, 1941 DOA: 04/07/2023 DOS: the patient was seen and examined on 04/08/2023 PCP: Maryelizabeth Rowan, MD  Patient coming from: Home  Chief Complaint:  Chief Complaint  Patient presents with   Numbness    HPI: Brent Glass is a 83 y.o. male with medical history significant for non-insulin-dependent diabetes mellitus, hypertension, chronic pain, metastatic prostate cancer, who presents to the ED with a 2-day history of intermittent numbness left face and left arm.  Denies weakness.  Was hospitalized in 2021 with transient neurologic deficit and rule out for stroke.  He was previously in his usual state of health. 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 CTA head and neck showed no LVO Hospitalist consulted for admission for stroke workup   Review of Systems: As mentioned in the history of present illness. All other systems reviewed and are negative.  Past Medical History:  Diagnosis Date   Arthritis    BPH (benign prostatic hyperplasia)    BPH with obstruction/lower urinary tract symptoms    Diabetes (HCC)    Disorder resulting from impaired renal function    Elevated PSA    Family history of lung cancer    Family history of lymphoma    Heart murmur    Hypertension    Knee pain    Lesion of lung    Low HDL (under 40)    Obesity    Urinary hesitancy    Past Surgical History:  Procedure Laterality Date   HERNIA REPAIR  2012   Umbilical Hernia Repair   KNEE ARTHROSCOPY Left 04/10/2015   Procedure: ARTHROSCOPY KNEE, partial medial meniscectomy, partial synovectomy;  Surgeon: Kennedy Bucker, MD;  Location: [facility] ORS;  Service: Orthopedics;  Laterality: Left;   Social History:  reports that he has been  smoking cigarettes. He has never used smokeless tobacco. He reports that he does not drink alcohol and does not use drugs.  No Known Allergies  Family History  Problem Relation Age of Onset   Diabetes Mother    Lung cancer Mother    Lymphoma Son 35       died in 41.    Kidney disease Neg Hx    Prostate cancer Neg Hx     Prior to Admission medications   Medication Sig Start Date End Date Taking? Authorizing Provider  acetaminophen (TYLENOL) 650 MG CR tablet Take 650 mg by mouth every 8 (eight) hours as needed for pain.    [provider]  albuterol (PROVENTIL HFA;VENTOLIN HFA) 108 (90 BASE) MCG/ACT inhaler Inhale 2 puffs into the lungs every 6 (six) hours as needed for wheezing or shortness of breath.    [provider]  aspirin 81 MG tablet Take 81 mg by mouth daily.    [provider]  cyanocobalamin 2000 MCG tablet Take 1 tablet (2,000 mcg total) by mouth daily. 09/17/20   Earna Coder, MD  enzalutamide [name]) 40 MG tablet Take 3 tablets (120 mg total) by mouth daily. 03/22/23   Earna Coder, MD  finasteride (PROSCAR) 5 MG tablet TAKE 1 TABLET BY MOUTH EVERY DAY Patient taking differently: Take 5 mg by mouth daily. 11/09/17   Michiel Cowboy A, PA-C  furosemide (LASIX) 20  MG tablet Take 1 tablet (20 mg total) by mouth daily. 10/26/21   Earna Coder, MD  GLUMETZA 1000 MG 24 hr tablet Take 1,000 mg by mouth 2 (two) times daily. 09/05/20   [provider]  metFORMIN (GLUCOPHAGE) 1000 MG tablet Take 1,000 mg by mouth 2 (two) times daily with a meal.    [provider]  rosuvastatin (CRESTOR) 20 MG tablet Take 20 mg by mouth every morning. 09/15/20   [provider]  simvastatin (ZOCOR) 40 MG tablet Take 40 mg by mouth daily.    [provider]  thiamine 100 MG tablet TAKE 1 TABLET BY MOUTH EVERY DAY 09/17/20   Earna Coder, MD  traMADol (ULTRAM) 50 MG tablet Take 1 tablet (50 mg total) by mouth  daily as needed. 09/30/22   Earna Coder, MD    Physical Exam: Vitals:   04/07/23 1559 04/07/23 1600 04/07/23 2320 04/08/23 0310  BP: (!) 166/78  (!) 165/77 (!) 156/59  Pulse: 71  72 70  Resp: 18  18 16   Temp: 98.4 F (36.9 C)  98.3 F (36.8 C) 98.6 F (37 C)  TempSrc: Oral  Oral Oral  SpO2: 96%  96% 100%  Weight:  96.6 kg    Height:  6' (1.829 m)     Physical Exam Vitals and nursing note reviewed.  Constitutional:      General: He is not in acute distress. HENT:     Head: Normocephalic and atraumatic.  Cardiovascular:     Rate and Rhythm: Normal rate and regular rhythm.     Heart sounds: Normal heart sounds.  Pulmonary:     Effort: Pulmonary effort is normal.     Breath sounds: Normal breath sounds.  Abdominal:     Palpations: Abdomen is soft.     Tenderness: There is no abdominal tenderness.  Neurological:     General: No focal deficit present.     Mental Status: Mental status is at baseline.     Labs on Admission: I have personally reviewed following labs and imaging studies  CBC: Recent Labs  Lab 04/07/23 1605  WBC 10.5  NEUTROABS 5.6  HGB 12.4*  HCT 38.5*  MCV 87.3  PLT 385   Basic Metabolic Panel: Recent Labs  Lab 04/07/23 1605  NA 140  K 4.0  CL 107  CO2 23  GLUCOSE 106*  BUN 16  CREATININE 0.98  CALCIUM 9.3   GFR: Estimated Creatinine Clearance: 68.8 mL/min (by C-G formula based on SCr of 0.98 mg/dL). Liver Function Tests: Recent Labs  Lab 04/07/23 1605  AST 10*  ALT 7  ALKPHOS 92  BILITOT 0.6  PROT 7.0  ALBUMIN 3.6   No results for input(s): "LIPASE", "AMYLASE" in the last 168 hours. No results for input(s): "AMMONIA" in the last 168 hours. Coagulation Profile: Recent Labs  Lab 04/07/23 1605  INR 1.0   Cardiac Enzymes: No results for input(s): "CKTOTAL", "CKMB", "CKMBINDEX", "TROPONINI" in the last 168 hours. BNP (last 3 results) No results for input(s): "PROBNP" in the last 8760 hours. HbA1C: No results for  input(s): "HGBA1C" in the last 72 hours. CBG: No results for input(s): "GLUCAP" in the last 168 hours. Lipid Profile: No results for input(s): "CHOL", "HDL", "LDLCALC", "TRIG", "CHOLHDL", "LDLDIRECT" in the last 72 hours. Thyroid Function Tests: No results for input(s): "TSH", "T4TOTAL", "FREET4", "T3FREE", "THYROIDAB" in the last 72 hours. Anemia Panel: No results for input(s): "VITAMINB12", "FOLATE", "FERRITIN", "TIBC", "IRON", "RETICCTPCT" in the last 72  hours. Urine analysis:    Component Value Date/Time   COLORURINE YELLOW (A) 05/31/2020 1344   APPEARANCEUR CLEAR (A) 05/31/2020 1344   LABSPEC 1.014 05/31/2020 1344   PHURINE 5.0 05/31/2020 1344   GLUCOSEU NEGATIVE 05/31/2020 1344   HGBUR SMALL (A) 05/31/2020 1344   BILIRUBINUR NEGATIVE 05/31/2020 1344   KETONESUR NEGATIVE 05/31/2020 1344   PROTEINUR NEGATIVE 05/31/2020 1344   NITRITE NEGATIVE 05/31/2020 1344   LEUKOCYTESUR NEGATIVE 05/31/2020 1344    Radiological Exams on Admission: MR Brain W and Wo Contrast Result Date: 04/08/2023 CLINICAL DATA:  Headache, neuro deficit EXAM: MRI HEAD WITHOUT AND WITH CONTRAST TECHNIQUE: Multiplanar, multiecho pulse sequences of the brain and surrounding structures were obtained without and with intravenous contrast. CONTRAST:  10mL GADAVIST GADOBUTROL 1 MMOL/ML IV SOLN COMPARISON:  CT head 04/07/2023. FINDINGS: Brain: Small focus of restricted diffusion in the right thalamus (see series 8 and series 9, image 26). No substantial edema. No mass effect. No midline shift. No evidence of acute hemorrhage, mass lesion or hydrocephalus. Cavum septum pellucidum et vergae, anatomic variant. No pathologic enhancement. Mild-to-moderate patchy T2/FLAIR hyperintensities the white matter, compatible with chronic microvascular ischemic disease. Vascular: Major arterial flow voids are maintained at the skull base. Skull and upper cervical spine: Normal marrow signal. Sinuses/Orbits: Right maxillary sinus retention  cyst. Otherwise, mostly clear sinuses. No acute orbital findings. Other: No mastoid effusions. IMPRESSION: Small acute or early subacute infarct in the right thalamus. Electronically Signed   By: Feliberto Harts M.D.   On: 04/08/2023 00:56   CT ANGIO HEAD NECK W WO CM Result Date: 04/07/2023 CLINICAL DATA:  Neuro deficit, acute, stroke suspected left sided numbness/tingling EXAM: CT ANGIOGRAPHY HEAD AND NECK WITH AND WITHOUT CONTRAST TECHNIQUE: Multidetector CT imaging of the head and neck was performed using the standard protocol during bolus administration of intravenous contrast. Multiplanar CT image reconstructions and MIPs were obtained to evaluate the vascular anatomy. Carotid stenosis measurements (when applicable) are obtained utilizing NASCET criteria, using the distal internal carotid diameter as the denominator. RADIATION DOSE REDUCTION: This exam was performed according to the departmental dose-optimization program which includes automated exposure control, adjustment of the mA and/or kV according to patient size and/or use of iterative reconstruction technique. CONTRAST:  75mL OMNIPAQUE IOHEXOL 350 MG/ML SOLN COMPARISON:  Same day CT head.  CT of the chest September 27, 2022. FINDINGS: CTA NECK FINDINGS Aortic arch: Great vessel origins are patent. Aortic atherosclerosis. Right carotid system: Atherosclerosis without greater than 50% stenosis. Left carotid system: Atherosclerosis without greater than 50% stenosis. Vertebral arteries: Severe right proximal V2 vertebral artery stenosis. Both vertebral arteries remain patent. Skeleton: No acute abnormality on limited assessment. Multilevel degenerative change. Other neck: No acute abnormality on limited assessment. Upper chest: Interval increase in cavitary lesion in the right upper lobe, now measuring 3.3 by 2.1 cm. 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 Bilateral intracranial ICAs, MCAs, and ACAs are patent without proximal high-grade stenosis. Posterior circulation: Bilateral intradural vertebral arteries, basilar artery and posterior cerebral arteries are patent without proximal high-grade stenosis. Venous sinuses: As permitted by contrast timing, patent. Review of the MIP images confirms the above findings IMPRESSION: 1. No emergent large vessel occlusion. 2. Severe right V2 vertebral artery stenosis. 3. In comparison to July 22, 24, interval increase in cavitary lesion in the right upper lobe, now measuring 3.3 by 2.1 cm. Recommend dedicated CT of the chest (preferably with contrast) for  further characterization. Electronically Signed   By: Feliberto Harts M.D.   On: 04/07/2023 23:57   CT HEAD WO CONTRAST Result Date: 04/07/2023 CLINICAL DATA:  Neuro deficit, acute, stroke suspected EXAM: CT HEAD WITHOUT CONTRAST TECHNIQUE: Contiguous axial images were obtained from the base of the skull through the vertex without intravenous contrast. RADIATION DOSE REDUCTION: This exam was performed according to the departmental dose-optimization program which includes automated exposure control, adjustment of the mA and/or kV according to patient size and/or use of iterative reconstruction technique. COMPARISON:  07/31/2020 FINDINGS: Brain: No evidence of acute infarction, hemorrhage, hydrocephalus, extra-axial collection or mass lesion/mass effect. Cavum septum pellucidum. Scattered low-density changes within the periventricular and subcortical white matter most compatible with chronic microvascular ischemic change. Mild diffuse cerebral volume loss. Vascular: Atherosclerotic calcifications involving the large vessels of the skull base. No unexpected hyperdense vessel. Skull: Normal. Negative for fracture or focal lesion. Sinuses/Orbits: No acute finding. Other: None. IMPRESSION: 1. No acute intracranial findings. 2. Chronic microvascular ischemic  change and cerebral volume loss. Electronically Signed   By: Duanne Guess D.O.   On: 04/07/2023 17:11     Data Reviewed: Relevant notes from primary care and specialist visits, past discharge summaries as available in EHR, including Care Everywhere. Prior diagnostic testing as pertinent to current admission diagnoses Updated medications and problem lists for reconciliation ED course, including vitals, labs, imaging, treatment and response to treatment Triage notes, nursing and pharmacy notes and ED provider's notes Notable results as noted in HPI   Assessment and Plan: * Acute CVA (cerebrovascular accident) (HCC) BP control as patient 48 hours out from onset of symptoms statins for LDL goal less than 70 ASA 81mg  daily, Plavix 75mg  daily x 3 weeks then monotherapy thereafter Telemetry, echocardiogram Avoid dextrose containing fluids, Maintain euglycemia, euthermia Neuro checks q4 hrs x 24 hrs and then per shift Head of bed 30 degrees Physical therapy/Occupational therapy/Speech therapy if failed dysphagia screen Neurology consult to follow   Diabetes mellitus without complication (HCC) Sliding scale insulin coverage  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 Observation status is judged to be reasonable and necessary in order to provide the required intensity of service to ensure the patient's safety. The patient's presenting symptoms, physical exam findings, and initial radiographic and laboratory data in the context of their medical condition is felt to place them at decreased risk for further clinical deterioration. Furthermore, it is anticipated that the patient will be medically stable  for discharge from the hospital within 2 midnights of admission.   Author: Andris Baumann, MD 04/08/2023 3:22 AM  For on call review [email or web address].

## 2023-04-08 NOTE — Assessment & Plan Note (Signed)
BP control as patient 48 hours out from onset of symptoms statins for LDL goal less than 70 ASA 81mg  daily, Plavix 75mg  daily x 3 weeks then monotherapy thereafter Telemetry, echocardiogram Avoid dextrose containing fluids, Maintain euglycemia, euthermia Neuro checks q4 hrs x 24 hrs and then per shift Head of bed 30 degrees Physical therapy/Occupational therapy/Speech therapy if failed dysphagia screen Neurology consult to follow

## 2023-04-08 NOTE — ED Notes (Signed)
Pt ambulatory to bathroom with cane at this time.

## 2023-04-08 NOTE — ED Notes (Signed)
Pt provided w/ sandwich and drink per request.

## 2023-04-08 NOTE — Evaluation (Signed)
Physical Therapy Evaluation Patient Details Name: Brent Glass MRN: [id number] DOB: 08-19-1940 Today's Date: 04/08/2023  History of Present Illness  Pt is an 83 y.o. male presenting to hospital 04/07/23 with c/o L facial numbness and L UE N/T x2 days.  Imaging showing small acute or early subacute infarct in R thalamus.  Pt admitted with acute CVA. PMH includes prostate CA with bony metastasis, htn, DM, htn, chronic pain.  Clinical Impression  Prior to recent medical concerns, pt was ambulatory (no AD use in home; used [facility] in community); lives alone in 1 level home; no recent falls reported.  Pt reporting still having some symptoms (numbness in L fingers and L face); pt reports no LE symptoms and no difficulty with ambulation.  Currently pt is independent with bed mobility; modified independent with transfers; and modified independent ambulating with [facility] in ED hallway.  No loss of balance noted during sessions activities.  No acute PT needs identified; will sign off.     If plan is discharge home, recommend the following:     Can travel by private vehicle        Equipment Recommendations None recommended by PT  Recommendations for Other Services       Functional Status Assessment Patient has not had a recent decline in their functional status     Precautions / Restrictions Precautions Precautions: Fall Restrictions Weight Bearing Restrictions Per Provider Order: No      Mobility  Bed Mobility Overal bed mobility: Independent             General bed mobility comments: Supine to/from sitting without any noted difficulties    Transfers Overall transfer level: Modified independent Equipment used: Straight cane               General transfer comment: steady transfer from ED stretcher bed    Ambulation/Gait Ambulation/Gait assistance: Modified independent (Device/Increase time) Gait Distance (Feet): 160 Feet Assistive device: Straight cane Gait  Pattern/deviations: WFL(Within Functional Limits) Gait velocity: mildly decreased     General Gait Details: steady step through gait pattern  Stairs            Wheelchair Mobility     Tilt Bed    Modified Rankin (Stroke Patients Only)       Balance Overall balance assessment: Needs assistance Sitting-balance support: No upper extremity supported, Feet supported Sitting balance-Leahy Scale: Normal Sitting balance - Comments: steady reaching outside BOS and donning/doffing shoes   Standing balance support: Single extremity supported, During functional activity, Reliant on assistive device for balance (using SPC) Standing balance-Leahy Scale: Normal Standing balance comment: steady ambulating with SPC use                             Pertinent Vitals/Pain Pain Assessment Pain Assessment: No/denies pain    Home Living Family/patient expects to be discharged to:: Private residence Living Arrangements: Alone Available Help at Discharge: Neighbor;Available PRN/intermittently Type of Home: Apartment Home Access: Stairs to enter Entrance Stairs-Rails: Right;Left;Can reach both Entrance Stairs-Number of Steps: 4   Home Layout: One level Home Equipment: BSC/3in1;Cane - single point      Prior Function Prior Level of Function : Independent/Modified Independent;Driving             Mobility Comments: No AD use in home; uses SPC in community; no recent falls reported ADLs Comments: Modified independent; (+) driving     Extremity/Trunk Assessment   Upper Extremity Assessment Upper  Extremity Assessment: Defer to OT evaluation LUE Sensation:  Per OT eval "(pt reports "numbness" in L DIP/PIP that is resolving)"    Lower Extremity Assessment Lower Extremity Assessment: Overall WFL for tasks assessed (able to perform B LE SLR independently; intact B LE heel to shin coordination and light touch; no abnormal tone noted)    Cervical / Trunk  Assessment Cervical / Trunk Assessment: Normal  Communication   Communication Communication: No apparent difficulties Cueing Techniques: Verbal cues  Cognition Arousal: Alert Behavior During Therapy: WFL for tasks assessed/performed Overall Cognitive Status: Within Functional Limits for tasks assessed                                 General Comments: A&O x4; pleasant and cooperative        General Comments  Nursing cleared pt for participation in physical therapy.  Pt agreeable to PT session.    Exercises     Assessment/Plan    PT Assessment Patient does not need any further PT services  PT Problem List         PT Treatment Interventions      PT Goals (Current goals can be found in the Care Plan section)  Acute Rehab PT Goals Patient Stated Goal: to go home PT Goal Formulation: With patient Time For Goal Achievement: 04/22/23 Potential to Achieve Goals: Good    Frequency       Co-evaluation               AM-PAC PT "6 Clicks" Mobility  Outcome Measure Help needed turning from your back to your side while in a flat bed without using bedrails?: None Help needed moving from lying on your back to sitting on the side of a flat bed without using bedrails?: None Help needed moving to and from a bed to a chair (including a wheelchair)?: None Help needed standing up from a chair using your arms (e.g., wheelchair or bedside chair)?: None Help needed to walk in hospital room?: None Help needed climbing 3-5 steps with a railing? : None 6 Click Score: 24    End of Session Equipment Utilized During Treatment: Gait belt Activity Tolerance: Patient tolerated treatment well Patient left: in bed (in ED stretcher bed in hallway; across from nursing station; pt's phone in reach) Nurse Communication: Mobility status;Precautions PT Visit Diagnosis: Other abnormalities of gait and mobility (R26.89)    Time: 4098-1191 PT Time Calculation (min) (ACUTE ONLY): 10  min   Charges:   PT Evaluation $PT Eval Low Complexity: 1 Low   PT General Charges $$ ACUTE PT VISIT: 1 Visit        Hendricks Limes, PT 04/08/23, 6:05 PM

## 2023-04-08 NOTE — Evaluation (Signed)
Occupational Therapy Evaluation Patient Details Name: Brent Glass MRN: [id number] DOB: 1941/02/10 Today's Date: 04/08/2023   History of Present Illness Pt is a 83 y.o. male presents to the ED with a 2-day history of intermittent numbness left face and left arm. PMHx: non-insulin-dependent diabetes mellitus, HTN, chronic pain, metastatic prostate cancer, & obesity. 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    Clinical Impression   Pt pleasant, A&Ox4, seen in ED Hallway. Pt endorsing most symptoms have resolved except slight tingling/numbness in L hand distally. Pt lives alone, and uses [facility] [place] for mobility, no falls hx. On eval, bilat UE strength grossly 4+/5, good grip strength bilat, and completes bed mobility, sit<>stand transfers and ~40 ft functional mobility in hallway mod independent. Has been going to hallway bathroom without difficulties, pt states feeling at baseline. Educated pt on receiving clearance from MD prior to returning to driving. No skilled OT needs identified, OT will sign off.       If plan is discharge home, recommend the following: Assist for transportation;Help with stairs or ramp for entrance    Functional Status Assessment  Patient has not had a recent decline in their functional status  Equipment Recommendations  None recommended by OT       Precautions / Restrictions Precautions Precautions: Fall Restrictions Weight Bearing Restrictions Per Provider Order: No      Mobility Bed Mobility Overal bed mobility: Modified Independent                  Transfers Overall transfer level: Modified independent Equipment used: Straight cane                      Balance Overall balance assessment: Mild deficits observed, not formally tested                                         ADL either performed or assessed with clinical judgement   ADL Overall ADL's : Modified independent;At baseline                                        General ADL Comments: Pt states he has been walking to bathroom using SPC mod independently     Vision Baseline Vision/History: 1 Wears glasses Ability to See in Adequate Light: 0 Adequate Patient Visual Report: No change from baseline (able to read large and fine print WNL) Vision Assessment?: Wears glasses for reading            Pertinent Vitals/Pain Pain Assessment Pain Assessment: No/denies pain     Extremity/Trunk Assessment Upper Extremity Assessment Upper Extremity Assessment: Right hand dominant;LUE deficits/detail (Grossly 4+/5 BUE, good bilat grip strength) LUE Sensation:  (pt reports "numbness" in L DIP/PIP that is resolving)   Lower Extremity Assessment Lower Extremity Assessment: Overall WFL for tasks assessed       Communication Communication Communication: No apparent difficulties   Cognition Arousal: Alert Behavior During Therapy: WFL for tasks assessed/performed Overall Cognitive Status: Within Functional Limits for tasks assessed                                 General Comments: A&Ox4, pleasant and cooperative  Home Living Family/patient expects to be discharged to:: Private residence Living Arrangements: Alone Available Help at Discharge: Neighbor;Available PRN/intermittently Type of Home: Apartment Home Access: Stairs to enter Entrance Stairs-Number of Steps: 4 Entrance Stairs-Rails: None Home Layout: One level     Bathroom Shower/Tub: Chief Strategy Officer: Standard     Home Equipment: BSC/3in1;Cane - single point          Prior Functioning/Environment Prior Level of Function : Independent/Modified Independent;Driving             Mobility Comments: SPC, no falls reported ADLs Comments: mod independent including driving         AM-PAC OT "6 Clicks" Daily Activity     Outcome Measure Help from another person eating meals?:  None Help from another person taking care of personal grooming?: None Help from another person toileting, which includes using toliet, bedpan, or urinal?: None Help from another person bathing (including washing, rinsing, drying)?: None Help from another person to put on and taking off regular upper body clothing?: None Help from another person to put on and taking off regular lower body clothing?: None 6 Click Score: 24   End of Session Equipment Utilized During Treatment:  [facility]) Nurse Communication: Mobility status  Activity Tolerance: Patient tolerated treatment well Patient left: in bed;with call bell/phone within reach (hallway gurney)                   Time: 4098-1191 OT Time Calculation (min): 10 min Charges:  OT General Charges $OT Visit: 1 Visit OT Evaluation $OT Eval Low Complexity: 1 Low  Kenn Rekowski L. Erandy Mceachern, OTR/L  04/08/23, 4:50 PM

## 2023-04-08 NOTE — Assessment & Plan Note (Addendum)
On Xtandi

## 2023-04-08 NOTE — ED Notes (Signed)
 Neurology at bedside.

## 2023-04-08 NOTE — Assessment & Plan Note (Signed)
 Sliding scale insulin coverage

## 2023-04-08 NOTE — Progress Notes (Signed)
*  PRELIMINARY RESULTS* Echocardiogram 2D Echocardiogram has been performed.  Carolyne Fiscal 04/08/2023, 1:42 PM

## 2023-04-08 NOTE — ED Provider Notes (Signed)
Care of this patient assumed from prior physician at 2300 pending MRI and disposition. Please see prior physician note for further details.  Briefly this is an 83 year old male who presented with left face and arm paresthesias outside of the code stroke window with symptoms for 2 days.  Labs without critical derangements.  CT head unremarkable.  CTA head and neck and MRI brain pending at time of signout.  CTA head and neck without acute findings.  MRI brain does demonstrate an acute to subacute right thalamic infarct consistent with the patient's symptoms.  On reevaluation, patient does report continued sensory change over his left lower face and left hand, denies other symptoms.  Specifically denies vision changes, weakness.  With his MRI concerning for acute stroke, do think he is appropriate for admission.  Patient comfortable with this plan.  Will reach out to hospitalist team.  Case discussed with Dr. Para March.  She will evaluate the patient for anticipated admission.   Trinna Post, MD 04/08/23 [phone]

## 2023-04-08 NOTE — Progress Notes (Signed)
Progress Note   Patient: Brent Glass GNF:[id number] DOB: January 21, 1941 DOA: 04/07/2023     0 DOS: the patient was seen and examined on 04/08/2023   Brief hospital course:  From HPI "Brent Glass is a 83 y.o. male with medical history significant for non-insulin-dependent diabetes mellitus, hypertension, chronic pain, metastatic prostate cancer, who presents to the ED with a 2-day history of intermittent numbness left face and left arm.  Denies weakness.  Was hospitalized in 2021 with transient neurologic deficit and rule out for stroke.  He was previously in his usual state of health. 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 CTA head and neck showed no LVO Hospitalist consulted for admission for stroke workup "  Subjective:  Patient seen and examined complaining of numbness in the left hand Denies any weakness nausea vomiting no abdominal pain Pending echocardiogram as well as neuro eval   Assessment and Plan: * Acute CVA (cerebrovascular accident) (HCC) Continue ASA 81mg  daily, Plavix 75mg  daily x 3 weeks then monotherapy thereafter Continue telemetry Follow-up on echocardiogram report Continue to avoid dextrose containing fluids, Maintain euglycemia, euthermia Continue neurochecks Awaiting PT OT eval I have discussed the case with neurology Dr. Amada Jupiter     Diabetes mellitus without complication (HCC) Continue sliding scale insulin coverage   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) On Xtandi     DVT prophylaxis: Lovenox   Consults: none   Advance Care Planning:   Code Status: Prior    Family Communication: none      Physical Exam:  Vitals and nursing note reviewed.  Constitutional:      General: He is not in acute distress. HENT:     Head:  Normocephalic and atraumatic.  Cardiovascular:     Rate and Rhythm: Normal rate and regular rhythm.     Heart sounds: Normal heart sounds.  Pulmonary:     Effort: Pulmonary effort is normal.     Breath sounds: Normal breath sounds.  Abdominal:     Palpations: Abdomen is soft.     Tenderness: There is no abdominal tenderness.  Neurological: Numbness noted left-upper extremity    Data reviewed I have reviewed the MRI showing thalamic stroke    Latest Ref Rng & Units 04/07/2023    4:05 PM 03/22/2023    2:30 PM 01/18/2023    2:21 PM  BMP  Glucose 70 - 99 mg/dL 846  962  952   BUN 8 - 23 mg/dL 16  13  12    Creatinine 0.61 - 1.24 mg/dL 8.41  3.24  4.01   Sodium 135 - 145 mmol/L 140  136  139   Potassium 3.5 - 5.1 mmol/L 4.0  3.9  3.8   Chloride 98 - 111 mmol/L 107  101  104   CO2 22 - 32 mmol/L 23  24  24    Calcium 8.9 - 10.3 mg/dL 9.3  9.8  9.7        Latest Ref Rng & Units 04/07/2023    4:05 PM 03/22/2023    2:30 PM 01/18/2023    2:23 PM  CBC  WBC 4.0 - 10.5 K/uL 10.5  10.2  10.8   Hemoglobin 13.0 - 17.0 g/dL 02.7  25.3  66.4   Hematocrit 39.0 - 52.0 %  38.5  42.3  37.5   Platelets 150 - 400 K/uL 385  352  501      Vitals:   04/08/23 0310 04/08/23 0822 04/08/23 0932 04/08/23 1254  BP: (!) 156/59 (!) 147/86  (!) 159/75  Pulse: 70 74  69  Resp: 16 17  18   Temp: 98.6 F (37 C)  98 F (36.7 C) 98.2 F (36.8 C)  TempSrc: Oral   Oral  SpO2: 100% 96%  98%  Weight:      Height:        Time spent: 58 minutes  Author: Loyce Dys, MD 04/08/2023 4:20 PM  For on call review [email or web address].

## 2023-04-08 NOTE — Consult Note (Signed)
NEUROLOGY CONSULT NOTE   Date of service: April 08, 2023 Patient Name: JAKEOB TULLIS MRN:  [id number] DOB:  1940/12/11 Chief Complaint: "Left-sided numbness" Requesting Provider: Loyce Dys, MD  History of Present Illness  MACLAIN COHRON is a 83 y.o. male with hx of hypertension, hyperlipidemia, diabetes who presents with left-sided numbness that started a few days ago.  LKW: Few days ago Modified rankin score: 0-Completely asymptomatic and back to baseline post- stroke IV Thrombolysis: Outside window EVT: No, outside window  NIHSS components Score: Comment  1a Level of Conscious 0[x]  1[]  2[]  3[]      1b LOC Questions 0[x]  1[]  2[]       1c LOC Commands 0[x]  1[]  2[]       2 Best Gaze 0[x]  1[]  2[]       3 Visual 0[x]  1[]  2[]  3[]      4 Facial Palsy 0[x]  1[]  2[]  3[]      5a Motor Arm - left 0[x]  1[]  2[]  3[]  4[]  UN[]    5b Motor Arm - Right 0[x]  1[]  2[]  3[]  4[]  UN[]    6a Motor Leg - Left 0[x]  1[]  2[]  3[]  4[]  UN[]    6b Motor Leg - Right 0[x]  1[]  2[]  3[]  4[]  UN[]    7 Limb Ataxia 0[x]  1[]  2[]  3[]  UN[]     8 Sensory 0[]  1[x]  2[]  UN[]      9 Best Language 0[x]  1[]  2[]  3[]      10 Dysarthria 0[x]  1[]  2[]  UN[]      11 Extinct. and Inattention 0[x]  1[]  2[]       TOTAL: 1       Past History   Past Medical History:  Diagnosis Date   Arthritis    BPH (benign prostatic hyperplasia)    BPH with obstruction/lower urinary tract symptoms    Diabetes (HCC)    Disorder resulting from impaired renal function    Elevated PSA    Family history of lung cancer    Family history of lymphoma    Heart murmur    Hypertension    Knee pain    Lesion of lung    Low HDL (under 40)    Obesity    Urinary hesitancy     Past Surgical History:  Procedure Laterality Date   HERNIA REPAIR  2012   Umbilical Hernia Repair   KNEE ARTHROSCOPY Left 04/10/2015   Procedure: ARTHROSCOPY KNEE, partial medial meniscectomy, partial synovectomy;  Surgeon: Kennedy Bucker, MD;  Location: [facility] ORS;  Service: Orthopedics;   Laterality: Left;    Family History: Family History  Problem Relation Age of Onset   Diabetes Mother    Lung cancer Mother    Lymphoma Son 27       died in 33.    Kidney disease Neg Hx    Prostate cancer Neg Hx     Social History  reports that he has been smoking cigarettes. He has never used smokeless tobacco. He reports that he does not drink alcohol and does not use drugs.  No Known Allergies  Medications   Current Facility-Administered Medications:    [START ON 04/09/2023]  stroke: early stages of recovery book, , Does not apply, Once, Andris Baumann, MD   acetaminophen (TYLENOL) tablet 650 mg, 650 mg, Oral, Q4H PRN **OR** acetaminophen (TYLENOL) 160 MG/5ML solution 650 mg, 650 mg, Per Tube, Q4H PRN **OR** acetaminophen (TYLENOL) suppository 650 mg, 650 mg, Rectal, Q4H PRN, Andris Baumann, MD   albuterol (PROVENTIL) (2.5 MG/3ML) 0.083% nebulizer solution 2.5 mg, 2.5 mg, Inhalation, Q6H  PRN, Andris Baumann, MD   aspirin EC tablet 81 mg, 81 mg, Oral, Daily, Andris Baumann, MD, 81 mg at 04/08/23 1610   clopidogrel (PLAVIX) tablet 75 mg, 75 mg, Oral, Daily, Andris Baumann, MD, 75 mg at 04/08/23 0926   enoxaparin (LOVENOX) injection 40 mg, 40 mg, Subcutaneous, Q24H, Andris Baumann, MD, 40 mg at 04/08/23 0824   [START ON 04/09/2023] enzalutamide (XTANDI) tablet 120 mg, 120 mg, Oral, Daily, Djan, Scarlette Calico, MD   finasteride (PROSCAR) tablet 5 mg, 5 mg, Oral, Daily, Lindajo Royal V, MD, 5 mg at 04/08/23 0926   insulin aspart (novoLOG) injection 0-15 Units, 0-15 Units, Subcutaneous, TID WC, Andris Baumann, MD, 3 Units at 04/08/23 9604   insulin aspart (novoLOG) injection 0-5 Units, 0-5 Units, Subcutaneous, QHS, Andris Baumann, MD   simvastatin (ZOCOR) tablet 40 mg, 40 mg, Oral, Daily, Andris Baumann, MD, 40 mg at 04/08/23 5409  Current Outpatient Medications:    finasteride (PROSCAR) 5 MG tablet, TAKE 1 TABLET BY MOUTH EVERY DAY, Disp: 90 tablet, Rfl: 3   acetaminophen (TYLENOL)  650 MG CR tablet, Take 650 mg by mouth every 8 (eight) hours as needed for pain., Disp: , Rfl:    albuterol (PROVENTIL HFA;VENTOLIN HFA) 108 (90 BASE) MCG/ACT inhaler, Inhale 2 puffs into the lungs every 6 (six) hours as needed for wheezing or shortness of breath., Disp: , Rfl:    aspirin 81 MG tablet, Take 81 mg by mouth daily., Disp: , Rfl:    cyanocobalamin 2000 MCG tablet, Take 1 tablet (2,000 mcg total) by mouth daily., Disp: 90 tablet, Rfl: 1   enzalutamide (XTANDI) 40 MG tablet, Take 3 tablets (120 mg total) by mouth daily., Disp: 90 tablet, Rfl: 3   furosemide (LASIX) 20 MG tablet, Take 1 tablet (20 mg total) by mouth daily., Disp: 15 tablet, Rfl: 0   GLUMETZA 1000 MG 24 hr tablet, Take 1,000 mg by mouth 2 (two) times daily., Disp: , Rfl:    metFORMIN (GLUCOPHAGE) 1000 MG tablet, Take 1,000 mg by mouth 2 (two) times daily with a meal., Disp: , Rfl:    rosuvastatin (CRESTOR) 20 MG tablet, Take 20 mg by mouth every morning., Disp: , Rfl:    simvastatin (ZOCOR) 40 MG tablet, Take 40 mg by mouth daily., Disp: , Rfl:    thiamine 100 MG tablet, TAKE 1 TABLET BY MOUTH EVERY DAY, Disp: 90 tablet, Rfl: 1   traMADol (ULTRAM) 50 MG tablet, Take 1 tablet (50 mg total) by mouth daily as needed., Disp: 60 tablet, Rfl: 0  Vitals   Vitals:   04/08/23 0310 04/08/23 0822 04/08/23 0932 04/08/23 1254  BP: (!) 156/59 (!) 147/86  (!) 159/75  Pulse: 70 74  69  Resp: 16 17  18   Temp: 98.6 F (37 C)  98 F (36.7 C) 98.2 F (36.8 C)  TempSrc: Oral   Oral  SpO2: 100% 96%  98%  Weight:      Height:        Body mass index is 28.89 kg/m.  Physical Exam   Constitutional: Appears well-developed and well-nourished.   Neurologic Examination    Neuro: Mental Status: Patient is awake, alert, oriented to person, place, month, year, and situation. Patient is able to give a clear and coherent history. No signs of aphasia or neglect Cranial Nerves: II: Visual Fields are full. Pupils are equal, round,  and reactive to light.   III,IV, VI: EOMI without ptosis or diploplia.  V: Facial sensation is symmetric to temperature VII: Facial movement is symmetric.  VIII: hearing is intact to voice X: Uvula elevates symmetrically XII: tongue is midline without atrophy or fasciculations.  Motor: Tone is normal. Bulk is normal. 5/5 strength was present in all four extremities.  Sensory: Sensation is mildly diminished in the left hand Cerebellar: FNF intact bilaterally        Labs/Imaging/Neurodiagnostic studies   CBC:  Recent Labs  Lab 04-18-2023 1605  WBC 10.5  NEUTROABS 5.6  HGB 12.4*  HCT 38.5*  MCV 87.3  PLT 385   Basic Metabolic Panel:  Lab Results  Component Value Date   NA 140 04/18/23   K 4.0 04-18-2023   CO2 23 Apr 18, 2023   GLUCOSE 106 (H) April 18, 2023   BUN 16 04/18/2023   CREATININE 0.98 18-Apr-2023   CALCIUM 9.3 Apr 18, 2023   GFRNONAA >60 04/18/2023   GFRAA >60 04/03/2015   Lipid Panel:  Lab Results  Component Value Date   LDLCALC 154 (H) 04/08/2023   HgbA1c:  Lab Results  Component Value Date   HGBA1C 6.9 (H) 04/08/2023   Urine Drug Screen:     Component Value Date/Time   LABOPIA NONE DETECTED 05/31/2020 1344   COCAINSCRNUR NONE DETECTED 05/31/2020 1344   LABBENZ NONE DETECTED 05/31/2020 1344   AMPHETMU NONE DETECTED 05/31/2020 1344   THCU NONE DETECTED 05/31/2020 1344   LABBARB NONE DETECTED 05/31/2020 1344    Alcohol Level     Component Value Date/Time   ETH <10 2023-04-18 1605   INR  Lab Results  Component Value Date   INR 1.0 18-Apr-2023   APTT  Lab Results  Component Value Date   APTT 31 2023-04-18     MRI Brain(Personally reviewed): Right small thalamic stroke  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 male with a history of prostate cancer, hypertension, hyperlipidemia, diabetes who presents with what is likely a small vessel ischemic stroke.  Echo and telemetry are negative.   He still has an elevated LDL, and I would favor increasing his Crestor from 20-40.  I would also favor dual antiplatelet therapy for 3 weeks.  He continues to smoke and I strongly encouraged him to stop.  RECOMMENDATIONS  Increase Crestor from 20 mg to 40 Aspirin 81 and Plavix 75 mg daily for 3 weeks followed by aspirin monotherapy Smoking cessation PT/OT/ST per therapy recommendations Neurology will be available as needed ______________________________________________________________________    Signed, Ritta Slot, MD [facility]

## 2023-04-09 DIAGNOSIS — I639 Cerebral infarction, unspecified: Secondary | ICD-10-CM | POA: Diagnosis not present

## 2023-04-09 LAB — GLUCOSE, CAPILLARY
Glucose-Capillary: 107 mg/dL — ABNORMAL HIGH (ref 70–99)
Glucose-Capillary: 140 mg/dL — ABNORMAL HIGH (ref 70–99)

## 2023-04-09 MED ORDER — ROSUVASTATIN CALCIUM 20 MG PO TABS: 40.0000 mg | ORAL_TABLET | Freq: Every day | ORAL | 6 refills | Status: DC

## 2023-04-09 MED ORDER — ROSUVASTATIN CALCIUM 20 MG PO TABS: 40.0000 mg | ORAL_TABLET | Freq: Every day | ORAL | 6 refills | Status: AC

## 2023-04-09 MED ORDER — CLOPIDOGREL BISULFATE 75 MG PO TABS: 75.0000 mg | ORAL_TABLET | Freq: Every day | ORAL | 0 refills | Status: AC

## 2023-04-09 MED ORDER — CLOPIDOGREL BISULFATE 75 MG PO TABS: 75.0000 mg | ORAL_TABLET | Freq: Every day | ORAL | 0 refills | Status: DC

## 2023-04-09 NOTE — Plan of Care (Signed)
  Problem: Education: Goal: Ability to describe self-care measures that may prevent or decrease complications (Diabetes Survival Skills Education) will improve Outcome: Progressing Goal: Individualized Educational Video(s) Outcome: Progressing   Problem: Coping: Goal: Ability to adjust to condition or change in health will improve Outcome: Progressing   Problem: Fluid Volume: Goal: Ability to maintain a balanced intake and output will improve Outcome: Progressing   Problem: Health Behavior/Discharge Planning: Goal: Ability to identify and utilize available resources and services will improve Outcome: Progressing Goal: Ability to manage health-related needs will improve Outcome: Progressing   Problem: Metabolic: Goal: Ability to maintain appropriate glucose levels will improve Outcome: Progressing   Problem: Nutritional: Goal: Maintenance of adequate nutrition will improve Outcome: Progressing Goal: Progress toward achieving an optimal weight will improve Outcome: Progressing   Problem: Skin Integrity: Goal: Risk for impaired skin integrity will decrease Outcome: Progressing   Problem: Tissue Perfusion: Goal: Adequacy of tissue perfusion will improve Outcome: Progressing   Problem: Education: Goal: Knowledge of disease or condition will improve Outcome: Progressing   Problem: Ischemic Stroke/TIA Tissue Perfusion: Goal: Complications of ischemic stroke/TIA will be minimized Outcome: Progressing   Problem: Coping: Goal: Will verbalize positive feelings about self Outcome: Progressing Goal: Will identify appropriate support needs Outcome: Progressing   Problem: Health Behavior/Discharge Planning: Goal: Ability to manage health-related needs will improve Outcome: Progressing Goal: Goals will be collaboratively established with patient/family Outcome: Progressing   Problem: Self-Care: Goal: Ability to participate in self-care as condition permits will  improve Outcome: Progressing Goal: Verbalization of feelings and concerns over difficulty with self-care will improve Outcome: Progressing Goal: Ability to communicate needs accurately will improve Outcome: Progressing   Problem: Nutrition: Goal: Risk of aspiration will decrease Outcome: Progressing Goal: Dietary intake will improve Outcome: Progressing   Problem: Education: Goal: Knowledge of General Education information will improve Description: Including pain rating scale, medication(s)/side effects and non-pharmacologic comfort measures Outcome: Progressing   Problem: Health Behavior/Discharge Planning: Goal: Ability to manage health-related needs will improve Outcome: Progressing   Problem: Clinical Measurements: Goal: Ability to maintain clinical measurements within normal limits will improve Outcome: Progressing Goal: Will remain free from infection Outcome: Progressing Goal: Diagnostic test results will improve Outcome: Progressing Goal: Respiratory complications will improve Outcome: Progressing Goal: Cardiovascular complication will be avoided Outcome: Progressing   Problem: Activity: Goal: Risk for activity intolerance will decrease Outcome: Progressing   Problem: Nutrition: Goal: Adequate nutrition will be maintained Outcome: Progressing   Problem: Coping: Goal: Level of anxiety will decrease Outcome: Progressing   Problem: Elimination: Goal: Will not experience complications related to bowel motility Outcome: Progressing Goal: Will not experience complications related to urinary retention Outcome: Progressing   Problem: Pain Managment: Goal: General experience of comfort will improve and/or be controlled Outcome: Progressing   Problem: Safety: Goal: Ability to remain free from injury will improve Outcome: Progressing   Problem: Skin Integrity: Goal: Risk for impaired skin integrity will decrease Outcome: Progressing

## 2023-04-09 NOTE — Discharge Summary (Signed)
Physician Discharge Summary   Patient: Brent Glass MRN: [id number] DOB: [birth year]  Admit date:     04/07/2023  Discharge date: 04/09/23  Discharge Physician: Loyce Dys   PCP: Maryelizabeth Rowan, MD   Recommendations at discharge:  Follow-up with PCP  Discharge Diagnoses: Acute CVA (cerebrovascular accident) (HCC) Diabetes mellitus without complication (HCC) Prostate cancer metastatic to multiple sites [facility])  Hospital Course: Brent Glass is a 83 y.o. male with medical history significant for non-insulin-dependent diabetes mellitus, hypertension, chronic pain, metastatic prostate cancer, who presents to the ED with a 2-day history of intermittent numbness left face and left arm.  MRI brain with and without showed a small acute or early subacute infarct in the right thalamus CTA head and neck showed no LVO Hospitalist consulted for admission for stroke workup.  Echo was normal.  Patient seen by neurologist and cleared for discharge today on dual antiplatelet therapy as well as statin therapy   Consultants: Neuro Procedures performed: None Disposition: Home Diet recommendation:  Carb modified diet DISCHARGE MEDICATION: Allergies as of 04/09/2023   No Known Allergies      Medication List     STOP taking these medications    simvastatin 40 MG tablet Commonly known as: ZOCOR       TAKE these medications    acetaminophen 650 MG CR tablet Commonly known as: TYLENOL Take 650 mg by mouth every 8 (eight) hours as needed for pain.   albuterol 108 (90 Base) MCG/ACT inhaler Commonly known as: VENTOLIN HFA Inhale 2 puffs into the lungs every 6 (six) hours as needed for wheezing or shortness of breath.   aspirin 81 MG tablet Take 81 mg by mouth daily.   clopidogrel 75 MG tablet Commonly known as: PLAVIX Take 1 tablet (75 mg total) by mouth daily. Start taking on: April 10, 2023   cyanocobalamin 2000 MCG tablet Take 1 tablet (2,000 mcg total) by mouth daily.    enzalutamide 40 MG tablet Commonly known as: XTANDI Take 3 tablets (120 mg total) by mouth daily.   finasteride 5 MG tablet Commonly known as: PROSCAR TAKE 1 TABLET BY MOUTH EVERY DAY   furosemide 20 MG tablet Commonly known as: LASIX Take 1 tablet (20 mg total) by mouth daily.   metFORMIN 1000 MG tablet Commonly known as: GLUCOPHAGE Take 1,000 mg by mouth 2 (two) times daily with a meal.   Glumetza 1000 MG 24 hr tablet Generic drug: metFORMIN Take 1,000 mg by mouth 2 (two) times daily.   rosuvastatin 20 MG tablet Commonly known as: CRESTOR Take 2 tablets (40 mg total) by mouth at bedtime. What changed:  how much to take when to take this   thiamine 100 MG tablet Commonly known as: VITAMIN B1 TAKE 1 TABLET BY MOUTH EVERY DAY   traMADol 50 MG tablet Commonly known as: ULTRAM Take 1 tablet (50 mg total) by mouth daily as needed.        Discharge Exam: Filed Weights   04/07/23 1600 04/08/23 2002  Weight: 96.6 kg 95.3 kg   Constitutional:      General: He is not in acute distress. HENT:     Head: Normocephalic and atraumatic.  Cardiovascular:     Rate and Rhythm: Normal rate and regular rhythm.     Heart sounds: Normal heart sounds.  Pulmonary:     Effort: Pulmonary effort is normal.     Breath sounds: Normal breath sounds.  Abdominal:     Palpations: Abdomen is  soft.     Tenderness: There is no abdominal tenderness.  Neurological: Numbness noted left-upper extremity    Condition at discharge: good  The results of significant diagnostics from this hospitalization (including imaging, microbiology, ancillary and laboratory) are listed below for reference.   Imaging Studies: ECHOCARDIOGRAM COMPLETE Result Date: 04/08/2023    ECHOCARDIOGRAM REPORT   Patient Name:   Brent Glass Date of Exam: 04/08/2023 Medical Rec #:  [id number]      Height:       72.0 in Accession #:    [id number]     Weight:       213.0 lb Date of Birth:  17-Apr-1940      BSA:           2.188 m Patient Age:    82 years       BP:           147/86 mmHg Patient Gender: M              HR:           69 bpm. Exam Location:  [facility] Procedure: 2D Echo, Cardiac Doppler and Color Doppler Indications:     Stroke  History:         Patient has prior history of Echocardiogram examinations, most                  recent 05/27/2020. Stroke; Risk Factors:Hypertension and                  Diabetes.  Sonographer:     Mikki Harbor Referring Phys:  [id number] Andris Baumann Diagnosing Phys: Alwyn Pea MD IMPRESSIONS  1. Left ventricular ejection fraction, by estimation, is 65 to 70%. The left ventricle has normal function. The left ventricle has no regional wall motion abnormalities. There is mild eccentric left ventricular hypertrophy. Left ventricular diastolic parameters are consistent with Grade I diastolic dysfunction (impaired relaxation).  2. Right ventricular systolic function is normal. The right ventricular size is normal. There is normal pulmonary artery systolic pressure.  3. The mitral valve is normal in structure. Trivial mitral valve regurgitation.  4. The aortic valve is normal in structure. Aortic valve regurgitation is trivial. FINDINGS  Left Ventricle: Left ventricular ejection fraction, by estimation, is 65 to 70%. The left ventricle has normal function. The left ventricle has no regional wall motion abnormalities. The left ventricular internal cavity size was normal in size. There is  mild eccentric left ventricular hypertrophy. Left ventricular diastolic parameters are consistent with Grade I diastolic dysfunction (impaired relaxation). Right Ventricle: The right ventricular size is normal. No increase in right ventricular wall thickness. Right ventricular systolic function is normal. There is normal pulmonary artery systolic pressure. The tricuspid regurgitant velocity is 2.08 m/s, and  with an assumed right atrial pressure of 3 mmHg, the estimated right ventricular systolic pressure is 20.3  mmHg. Left Atrium: Left atrial size was normal in size. Right Atrium: Right atrial size was normal in size. Pericardium: There is no evidence of pericardial effusion. Mitral Valve: The mitral valve is normal in structure. Trivial mitral valve regurgitation. MV peak gradient, 4.2 mmHg. The mean mitral valve gradient is 2.0 mmHg. Tricuspid Valve: The tricuspid valve is normal in structure. Tricuspid valve regurgitation is mild. Aortic Valve: The aortic valve is normal in structure. Aortic valve regurgitation is trivial. Aortic valve mean gradient measures 6.0 mmHg. Aortic valve peak gradient measures 13.5 mmHg. Aortic valve area, by VTI measures 2.61 cm. Pulmonic  Valve: The pulmonic valve was normal in structure. Pulmonic valve regurgitation is not visualized. Aorta: The ascending aorta was not well visualized. IAS/Shunts: No atrial level shunt detected by color flow Doppler.  LEFT VENTRICLE PLAX 2D LVIDd:         5.20 cm     Diastology LVIDs:         3.30 cm     LV e' medial:    6.74 cm/s LV PW:         0.90 cm     LV E/e' medial:  10.4 LV IVS:        1.30 cm     LV e' lateral:   6.74 cm/s LVOT diam:     2.10 cm     LV E/e' lateral: 10.4 LV SV:         104 LV SV Index:   48 LVOT Area:     3.46 cm  LV Volumes (MOD) LV vol d, MOD A2C: 79.2 ml LV vol d, MOD A4C: 71.8 ml LV vol s, MOD A2C: 29.2 ml LV vol s, MOD A4C: 34.1 ml LV SV MOD A2C:     50.0 ml LV SV MOD A4C:     71.8 ml LV SV MOD BP:      45.1 ml RIGHT VENTRICLE RV Basal diam:  4.15 cm RV Mid diam:    4.00 cm RV S prime:     20.60 cm/s TAPSE (M-mode): 2.5 cm LEFT ATRIUM              Index        RIGHT ATRIUM           Index LA diam:        4.20 cm  1.92 cm/m   RA Area:     20.30 cm LA Vol (A2C):   117.0 ml 53.46 ml/m  RA Volume:   62.10 ml  28.38 ml/m LA Vol (A4C):   76.6 ml  35.00 ml/m LA Biplane Vol: 97.5 ml  44.55 ml/m  AORTIC VALVE                     PULMONIC VALVE AV Area (Vmax):    2.22 cm      PV Vmax:       1.13 m/s AV Area (Vmean):   2.41 cm       PV Peak grad:  5.1 mmHg AV Area (VTI):     2.61 cm AV Vmax:           184.00 cm/s AV Vmean:          107.000 cm/s AV VTI:            0.400 m AV Peak Grad:      13.5 mmHg AV Mean Grad:      6.0 mmHg LVOT Vmax:         118.00 cm/s LVOT Vmean:        74.600 cm/s LVOT VTI:          0.301 m LVOT/AV VTI ratio: 0.75  AORTA Ao Root diam: 3.60 cm MITRAL VALVE               TRICUSPID VALVE MV Area (PHT): 2.32 cm    TR Peak grad:   17.3 mmHg MV Area VTI:   3.37 cm    TR Vmax:        208.00 cm/s MV Peak grad:  4.2 mmHg MV Mean grad:  2.0 mmHg  SHUNTS MV Vmax:       1.02 m/s    Systemic VTI:  0.30 m MV Vmean:      56.1 cm/s   Systemic Diam: 2.10 cm MV Decel Time: 327 msec MV E velocity: 70.30 cm/s MV A velocity: 95.50 cm/s MV E/A ratio:  0.74 Dwayne D Callwood MD Electronically signed by Alwyn Pea MD Signature Date/Time: 04/08/2023/2:22:23 PM    Final    MR Brain W and Wo Contrast Result Date: 04/08/2023 CLINICAL DATA:  Headache, neuro deficit EXAM: MRI HEAD WITHOUT AND WITH CONTRAST TECHNIQUE: Multiplanar, multiecho pulse sequences of the brain and surrounding structures were obtained without and with intravenous contrast. CONTRAST:  10mL GADAVIST GADOBUTROL 1 MMOL/ML IV SOLN COMPARISON:  CT head 04/07/2023. FINDINGS: Brain: Small focus of restricted diffusion in the right thalamus (see series 8 and series 9, image 26). No substantial edema. No mass effect. No midline shift. No evidence of acute hemorrhage, mass lesion or hydrocephalus. Cavum septum pellucidum et vergae, anatomic variant. No pathologic enhancement. Mild-to-moderate patchy T2/FLAIR hyperintensities the white matter, compatible with chronic microvascular ischemic disease. Vascular: Major arterial flow voids are maintained at the skull base. Skull and upper cervical spine: Normal marrow signal. Sinuses/Orbits: Right maxillary sinus retention cyst. Otherwise, mostly clear sinuses. No acute orbital findings. Other: No mastoid effusions. IMPRESSION:  Small acute or early subacute infarct in the right thalamus. Electronically Signed   By: Feliberto Harts M.D.   On: 04/08/2023 00:56   CT ANGIO HEAD NECK W WO CM Result Date: 04/07/2023 CLINICAL DATA:  Neuro deficit, acute, stroke suspected left sided numbness/tingling EXAM: CT ANGIOGRAPHY HEAD AND NECK WITH AND WITHOUT CONTRAST TECHNIQUE: Multidetector CT imaging of the head and neck was performed using the standard protocol during bolus administration of intravenous contrast. Multiplanar CT image reconstructions and MIPs were obtained to evaluate the vascular anatomy. Carotid stenosis measurements (when applicable) are obtained utilizing NASCET criteria, using the distal internal carotid diameter as the denominator. RADIATION DOSE REDUCTION: This exam was performed according to the departmental dose-optimization program which includes automated exposure control, adjustment of the mA and/or kV according to patient size and/or use of iterative reconstruction technique. CONTRAST:  75mL OMNIPAQUE IOHEXOL 350 MG/ML SOLN COMPARISON:  Same day CT head.  CT of the chest September 27, 2022. FINDINGS: CTA NECK FINDINGS Aortic arch: Great vessel origins are patent. Aortic atherosclerosis. Right carotid system: Atherosclerosis without greater than 50% stenosis. Left carotid system: Atherosclerosis without greater than 50% stenosis. Vertebral arteries: Severe right proximal V2 vertebral artery stenosis. Both vertebral arteries remain patent. Skeleton: No acute abnormality on limited assessment. Multilevel degenerative change. Other neck: No acute abnormality on limited assessment. Upper chest: Interval increase in cavitary lesion in the right upper lobe, now measuring 3.3 by 2.1 cm. 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 Bilateral intracranial ICAs, MCAs, and ACAs are patent without proximal high-grade stenosis.  Posterior circulation: Bilateral intradural vertebral arteries, basilar artery and posterior cerebral arteries are patent without proximal high-grade stenosis. Venous sinuses: As permitted by contrast timing, patent. Review of the MIP images confirms the above findings IMPRESSION: 1. No emergent large vessel occlusion. 2. Severe right V2 vertebral artery stenosis. 3. In comparison to July 22, 24, interval increase in cavitary lesion in the right upper lobe, now measuring 3.3 by 2.1 cm. Recommend dedicated CT of the chest (preferably with contrast) for further characterization. Electronically Signed   By: Thornton Dales  Yetta Barre M.D.   On: 04/07/2023 23:57   CT HEAD WO CONTRAST Result Date: 04/07/2023 CLINICAL DATA:  Neuro deficit, acute, stroke suspected EXAM: CT HEAD WITHOUT CONTRAST TECHNIQUE: Contiguous axial images were obtained from the base of the skull through the vertex without intravenous contrast. RADIATION DOSE REDUCTION: This exam was performed according to the departmental dose-optimization program which includes automated exposure control, adjustment of the mA and/or kV according to patient size and/or use of iterative reconstruction technique. COMPARISON:  07/31/2020 FINDINGS: Brain: No evidence of acute infarction, hemorrhage, hydrocephalus, extra-axial collection or mass lesion/mass effect. Cavum septum pellucidum. Scattered low-density changes within the periventricular and subcortical white matter most compatible with chronic microvascular ischemic change. Mild diffuse cerebral volume loss. Vascular: Atherosclerotic calcifications involving the large vessels of the skull base. No unexpected hyperdense vessel. Skull: Normal. Negative for fracture or focal lesion. Sinuses/Orbits: No acute finding. Other: None. IMPRESSION: 1. No acute intracranial findings. 2. Chronic microvascular ischemic change and cerebral volume loss. Electronically Signed   By: Duanne Guess D.O.   On: 04/07/2023 17:11     Microbiology: Results for orders placed or performed during the hospital encounter of 03/14/21  Resp Panel by RT-PCR (Flu A&B, Covid) Nasopharyngeal Swab     Status: None   Collection Time: 03/13/21  4:20 PM   Specimen: Nasopharyngeal Swab; Nasopharyngeal(NP) swabs in vial transport medium  Result Value Ref Range Status   SARS Coronavirus 2 by RT PCR NEGATIVE NEGATIVE Final    Comment: (NOTE) SARS-CoV-2 target nucleic acids are NOT DETECTED.  The SARS-CoV-2 RNA is generally detectable in upper respiratory specimens during the acute phase of infection. The lowest concentration of SARS-CoV-2 viral copies this assay can detect is 138 copies/mL. A negative result does not preclude SARS-Cov-2 infection and should not be used as the sole basis for treatment or other patient management decisions. A negative result may occur with  improper specimen collection/handling, submission of specimen other than nasopharyngeal swab, presence of viral mutation(s) within the areas targeted by this assay, and inadequate number of viral copies(<138 copies/mL). A negative result must be combined with clinical observations, patient history, and epidemiological information. The expected result is Negative.  Fact Sheet for Patients:  BloggerCourse.com  Fact Sheet for Healthcare Providers:  SeriousBroker.it  This test is no t yet approved or cleared by the [place] FDA and  has been authorized for detection and/or diagnosis of SARS-CoV-2 by FDA under an Emergency Use Authorization (EUA). This EUA will remain  in effect (meaning this test can be used) for the duration of the COVID-19 declaration under Section 564(b)(1) of the Act, 21 U.S.C.section 360bbb-3(b)(1), unless the authorization is terminated  or revoked sooner.       Influenza A by PCR NEGATIVE NEGATIVE Final   Influenza B by PCR NEGATIVE NEGATIVE Final    Comment: (NOTE) The Xpert  Xpress SARS-CoV-2/FLU/RSV plus assay is intended as an aid in the diagnosis of influenza from Nasopharyngeal swab specimens and should not be used as a sole basis for treatment. Nasal washings and aspirates are unacceptable for Xpert Xpress SARS-CoV-2/FLU/RSV testing.  Fact Sheet for Patients: BloggerCourse.com  Fact Sheet for Healthcare Providers: SeriousBroker.it  This test is not yet approved or cleared by the [place] FDA and has been authorized for detection and/or diagnosis of SARS-CoV-2 by FDA under an Emergency Use Authorization (EUA). This EUA will remain in effect (meaning this test can be used) for the duration of the COVID-19 declaration under Section 564(b)(1) of the Act, 21  U.S.C. section 360bbb-3(b)(1), unless the authorization is terminated or revoked.  Performed at [facility], [address] [place], [place] [address]     Labs: CBC: Recent Labs  Lab 04/07/23 1605  WBC 10.5  NEUTROABS 5.6  HGB 12.4*  HCT 38.5*  MCV 87.3  PLT 385   Basic Metabolic Panel: Recent Labs  Lab 04/07/23 1605  NA 140  K 4.0  CL 107  CO2 23  GLUCOSE 106*  BUN 16  CREATININE 0.98  CALCIUM 9.3   Liver Function Tests: Recent Labs  Lab 04/07/23 1605  AST 10*  ALT 7  ALKPHOS 92  BILITOT 0.6  PROT 7.0  ALBUMIN 3.6   CBG: Recent Labs  Lab 04/08/23 1158 04/08/23 1636 04/08/23 2050 04/09/23 0726 04/09/23 1139  GLUCAP 107* 108* 133* 107* 140*    Discharge time spent:  35 minutes.  Signed: Loyce Dys, MD [facility] 04/09/2023

## 2023-04-09 NOTE — Progress Notes (Signed)
Mobility Specialist - Progress Note   04/09/23 0953  Mobility  Activity Ambulated with assistance in hallway;Stood at bedside;Dangled on edge of bed  Level of Assistance Standby assist, set-up cues, supervision of patient - no hands on  Assistive Device Cane  Distance Ambulated (ft) 200 ft  Activity Response Tolerated well  Mobility Referral Yes  Mobility visit 1 Mobility  Mobility Specialist Start Time (ACUTE ONLY) 0913  Mobility Specialist Stop Time (ACUTE ONLY) 0924  Mobility Specialist Time Calculation (min) (ACUTE ONLY) 11 min   Pt sitting EOB on RA upon arrival. Pt completes dons shoes, buttons shirt, STS, and ambulates in hallway using personal cane SBA with no physical assistance needed. Pt returns to EOB with needs in reach.   Terrilyn Saver  Mobility Specialist  04/09/23 9:54 AM

## 2023-04-09 NOTE — Care Management Obs Status (Signed)
MEDICARE OBSERVATION STATUS NOTIFICATION   Patient Details  Name: Brent Glass MRN: [id number] Date of Birth: 1940-12-14   Medicare Observation Status Notification Given:  Yes    Reuel Boom Darus Hershman, LCSW 04/09/2023, 12:20 PM

## 2023-05-04 ENCOUNTER — Telehealth: Payer: Self-pay

## 2023-05-04 ENCOUNTER — Encounter: Payer: Self-pay | Admitting: Internal Medicine

## 2023-05-04 ENCOUNTER — Other Ambulatory Visit (HOSPITAL_COMMUNITY): Payer: Self-pay

## 2023-05-04 NOTE — Telephone Encounter (Signed)
 Called patient to OnBoard with [facility] and triage over from [employer] PAP. VM Box is full. I will continue to reach out to patient.   Call 1 - 04/27/23 VM Box Full Call 2 - 05/04/23 VM Box Full   Ardeen Fillers, CPhT Oncology Pharmacy Patient Advocate  [facility]  [phone] (phone) [phone] (fax) 05/04/2023 2:34 PM

## 2023-05-04 NOTE — Telephone Encounter (Signed)
 Oral Oncology Patient Advocate Encounter  Was successful in securing patient a $8,000.00 grant from [employer] to provide copayment coverage for [place].  This will keep the out of pocket expense at $0.     [employer] ID: [id number]   The billing information is as follows and has been shared with [facility].    RxBin: [id number] PCN: [id number] Member ID: [id number] Group ID: [id number] Dates of Eligibility: 02/10/23 through 02/09/24  Fund:  Prostate Cancer - Medicare Access   Ardeen Fillers, CPhT Oncology Pharmacy Patient Advocate  [facility]  [phone] (phone) [phone] (fax) 05/04/2023 2:30 PM

## 2023-05-10 ENCOUNTER — Other Ambulatory Visit: Payer: Self-pay | Admitting: Pharmacist

## 2023-05-10 ENCOUNTER — Other Ambulatory Visit: Payer: Self-pay | Admitting: Pharmacy Technician

## 2023-05-10 ENCOUNTER — Other Ambulatory Visit: Payer: Self-pay

## 2023-05-10 ENCOUNTER — Encounter: Payer: Self-pay | Admitting: Internal Medicine

## 2023-05-10 ENCOUNTER — Other Ambulatory Visit (HOSPITAL_COMMUNITY): Payer: Self-pay

## 2023-05-10 DIAGNOSIS — C61 Malignant neoplasm of prostate: Secondary | ICD-10-CM

## 2023-05-10 MED ORDER — ENZALUTAMIDE 40 MG PO TABS
120.0000 mg | ORAL_TABLET | Freq: Every day | ORAL | 3 refills | Status: DC
  Filled 2023-05-10 (×2): qty 90, 30d supply, fill #0
  Filled 2023-05-31: qty 90, 30d supply, fill #1

## 2023-05-10 NOTE — Progress Notes (Signed)
 Specialty Pharmacy Initiation Note   Brent Glass is a 83 y.o. male who will be followed by the specialty pharmacy service for RxSp Oncology    Review of administration, indication, effectiveness, safety, potential side effects, storage/disposable, and missed dose instructions occurred today for patient's specialty medication(s) Enzalutamide [name])     Patient/Caregiver did not have any additional questions or concerns.   Patient's therapy is appropriate to: Continue    Goals Addressed             This Visit's Progress    Slow Disease Progression       Patient is on track. Patient will maintain adherence        Patient switching from PAP to filling at [facility] (Specialty)   Remi Haggard Specialty Pharmacist

## 2023-05-10 NOTE — Telephone Encounter (Addendum)
 Oral Oncology Patient Advocate Encounter  Attempted to reach out to pt via phone, voicemail box is full.  Called pt's brother and asked if he could have Brent Glass reach back out to me and he stated he would. I have given him my office number to reach back out.  Pt has appt on 03/14. I will attempt to see pt in clinic if he does not reach out before then.  [name], CPhT Oncology Pharmacy Patient Advocate [facility] Direct Number: [phone] Fax: [phone]

## 2023-05-10 NOTE — Progress Notes (Signed)
 Specialty Pharmacy Initial Fill Coordination Note  Brent Glass is a 83 y.o. male contacted today regarding refills of specialty medication(s) Enzalutamide [name]) .  Patient requested Delivery  on 05/12/23  to verified address [address] [facility] [address]   Medication will be filled on 05/11/2023.   Patient is aware of $0 copayment.   [name], CPhT Oncology Pharmacy Patient Advocate [facility] Direct Number: [phone] Fax: [phone]

## 2023-05-10 NOTE — Progress Notes (Signed)
 Patient switching from PAP to filling at [facility] (Specialty)

## 2023-05-11 ENCOUNTER — Encounter: Payer: Self-pay | Admitting: Internal Medicine

## 2023-05-11 NOTE — Telephone Encounter (Signed)
 Oral Oncology Patient Advocate Encounter  Pt reached back out yesterday, 05/10/2023, I was able to successfully enroll him in [employer] and set up delivery.   [name], CPhT Oncology Pharmacy Patient Advocate [facility] Direct Number: [phone] Fax: [phone]

## 2023-05-20 ENCOUNTER — Encounter: Payer: Self-pay | Admitting: Internal Medicine

## 2023-05-20 ENCOUNTER — Inpatient Hospital Stay: Payer: Medicare HMO | Attending: Internal Medicine

## 2023-05-20 ENCOUNTER — Inpatient Hospital Stay: Payer: Medicare HMO | Admitting: Internal Medicine

## 2023-05-20 VITALS — BP 153/74 | HR 71 | Temp 98.2°F | Resp 18 | Ht 72.0 in | Wt 213.6 lb

## 2023-05-20 DIAGNOSIS — E119 Type 2 diabetes mellitus without complications: Secondary | ICD-10-CM | POA: Diagnosis not present

## 2023-05-20 DIAGNOSIS — Z7982 Long term (current) use of aspirin: Secondary | ICD-10-CM | POA: Diagnosis not present

## 2023-05-20 DIAGNOSIS — Z7902 Long term (current) use of antithrombotics/antiplatelets: Secondary | ICD-10-CM | POA: Diagnosis not present

## 2023-05-20 DIAGNOSIS — F17210 Nicotine dependence, cigarettes, uncomplicated: Secondary | ICD-10-CM | POA: Insufficient documentation

## 2023-05-20 DIAGNOSIS — Z7984 Long term (current) use of oral hypoglycemic drugs: Secondary | ICD-10-CM | POA: Insufficient documentation

## 2023-05-20 DIAGNOSIS — C61 Malignant neoplasm of prostate: Secondary | ICD-10-CM | POA: Diagnosis present

## 2023-05-20 DIAGNOSIS — Z79899 Other long term (current) drug therapy: Secondary | ICD-10-CM | POA: Diagnosis not present

## 2023-05-20 DIAGNOSIS — C7951 Secondary malignant neoplasm of bone: Secondary | ICD-10-CM | POA: Diagnosis present

## 2023-05-20 DIAGNOSIS — C7800 Secondary malignant neoplasm of unspecified lung: Secondary | ICD-10-CM | POA: Diagnosis present

## 2023-05-20 LAB — CMP (CANCER CENTER ONLY)
ALT: 10 U/L (ref 0–44)
AST: 11 U/L — ABNORMAL LOW (ref 15–41)
Albumin: 3.4 g/dL — ABNORMAL LOW (ref 3.5–5.0)
Alkaline Phosphatase: 121 U/L (ref 38–126)
Anion gap: 9 (ref 5–15)
BUN: 18 mg/dL (ref 8–23)
CO2: 23 mmol/L (ref 22–32)
Calcium: 9.2 mg/dL (ref 8.9–10.3)
Chloride: 102 mmol/L (ref 98–111)
Creatinine: 1.21 mg/dL (ref 0.61–1.24)
GFR, Estimated: 59 mL/min — ABNORMAL LOW (ref >60)
Glucose, Bld: 148 mg/dL — ABNORMAL HIGH (ref 70–99)
Potassium: 3.7 mmol/L (ref 3.5–5.1)
Sodium: 134 mmol/L — ABNORMAL LOW (ref 135–145)
Total Bilirubin: 0.5 mg/dL (ref 0.0–1.2)
Total Protein: 7.2 g/dL (ref 6.5–8.1)

## 2023-05-20 LAB — CBC WITH DIFFERENTIAL (CANCER CENTER ONLY)
Abs Immature Granulocytes: 0.05 10*3/uL (ref 0.00–0.07)
Basophils Absolute: 0.1 10*3/uL (ref 0.0–0.1)
Basophils Relative: 1 %
Eosinophils Absolute: 0.3 10*3/uL (ref 0.0–0.5)
Eosinophils Relative: 2 %
HCT: 36.4 % — ABNORMAL LOW (ref 39.0–52.0)
Hemoglobin: 11.7 g/dL — ABNORMAL LOW (ref 13.0–17.0)
Immature Granulocytes: 0 %
Lymphocytes Relative: 29 %
Lymphs Abs: 3.3 10*3/uL (ref 0.7–4.0)
MCH: 27.5 pg (ref 26.0–34.0)
MCHC: 32.1 g/dL (ref 30.0–36.0)
MCV: 85.4 fL (ref 80.0–100.0)
Monocytes Absolute: 0.8 10*3/uL (ref 0.1–1.0)
Monocytes Relative: 7 %
Neutro Abs: 7.1 10*3/uL (ref 1.7–7.7)
Neutrophils Relative %: 61 %
Platelet Count: 371 10*3/uL (ref 150–400)
RBC: 4.26 MIL/uL (ref 4.22–5.81)
RDW: 14.7 % (ref 11.5–15.5)
WBC Count: 11.5 10*3/uL — ABNORMAL HIGH (ref 4.0–10.5)
nRBC: 0 % (ref 0.0–0.2)

## 2023-05-20 LAB — PSA: Prostatic Specific Antigen: 3.81 ng/mL (ref 0.00–4.00)

## 2023-05-20 NOTE — Progress Notes (Signed)
 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   # NOV 2021- CTA [ER] Diffuse bilateral pulmonary nodules including a cavitary lesion in the right upper lobe measuring approximately 2 cm. There is extensive nodular pleural thickening bilaterally, greatest in the left lower lobe.  November 2021 PET scan-multiple lung lesions multiple bone lesions and prostate uptake.  February 2022-prostate biopsy [Dr.Brandon]   # DM-2; NO COPD [?]; ACTIVE SMOKER 65ppd.  # DEC 2nd week- FIRMAGON; # JAN 12TH, 2022-ZYTIGA  1000 MG+ PRED 5MG ; FEB MID 2022- ELIGARD q6M; MARCH 2022- DISCONTINUE ZYTIGA sec to PRESS [severe hypokalemia/mental status changes hospitalization];  #   # MAY 6th, 2022- XTnadi 3pills/day   # NGS/MOLECULAR TESTS: MARCH 2022-foundation 1 no targetable mutation./Genetic evaluation negative    # PALLIATIVE CARE EVALUATION:  # PAIN MANAGEMENT:    DIAGNOSIS: Prostate cancer  STAGE:   IV      ;  GOALS: Palliative  CURRENT/MOST RECENT THERAPY : ADT+ Xtandi    Prostate cancer metastatic to multiple sites [facility])  02/04/2020 Initial Diagnosis   Prostate cancer metastatic to multiple sites [facility])   03/19/2020 Cancer Staging   Staging form: Prostate, AJCC 8th Edition - Clinical: Stage IVB (pM1b) - Signed by Earna Coder, MD on 03/19/2020    Genetic Testing   Negative genetic testing. No pathogenic variants identified on the [facility] Multi-Cancer Panel. VUS in AXIN2 called c.1235A>C identified. The report date is 03/23/2020.   The Multi-Cancer Panel offered by [facility] includes sequencing and/or deletion duplication testing of the  following 84 genes: AIP, ALK, APC, ATM, AXIN2,BAP1,  BARD1, BLM, BMPR1A, BRCA1, BRCA2, BRIP1, CASR, CDC73, CDH1, CDK4, CDKN1B, CDKN1C, CDKN2A (p14ARF), CDKN2A (p16INK4a), CEBPA, CHEK2, CTNNA1, DICER1, DIS3L2, EGFR (c.2369C>T, p.Thr790Met variant only), EPCAM (Deletion/duplication testing only), FH, FLCN, GATA2, GPC3, GREM1 (Promoter region deletion/duplication testing only), HOXB13 (c.251G>A, p.Gly84Glu), HRAS, KIT, MAX, MEN1, MET, MITF (c.952G>A, p.Glu318Lys variant only), MLH1, MSH2, MSH3, MSH6, MUTYH, NBN, NF1, NF2, NTHL1, PALB2, PDGFRA, PHOX2B, PMS2, POLD1, POLE, POT1, PRKAR1A, PTCH1, PTEN, RAD50, RAD51C, RAD51D, RB1, RECQL4, RET, RUNX1, SDHAF2, SDHA (sequence changes only), SDHB, SDHC, SDHD, SMAD4, SMARCA4, SMARCB1, SMARCE1, STK11, SUFU, TERC, TERT, TMEM127, TP53, TSC1, TSC2, VHL, WRN and WT1.      HISTORY OF PRESENTING ILLNESS: Ambulating independently. alone  Brent Glass 83 y.o.  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   No falls.  He states that he is compliant with his oral Xtandi. No fever no chills.  No constipation no diarrhea.  No nausea no vomiting. Denies any worsening pain.   Review of Systems  Constitutional:  Positive for malaise/fatigue. Negative for chills, diaphoresis and fever.  HENT:  Negative for nosebleeds and sore throat.   Eyes:  Negative for double vision.  Respiratory:  Negative for hemoptysis, sputum production, shortness of breath and wheezing.   Cardiovascular:  Negative for chest pain, palpitations, orthopnea and leg swelling.  Gastrointestinal:  Negative for blood in stool, constipation, diarrhea, heartburn, melena, nausea and vomiting.  Genitourinary:  Negative for dysuria, frequency and urgency.  Musculoskeletal:  Positive for back pain and myalgias. Negative for joint pain.  Skin: Negative.  Negative for itching  and rash.  Neurological:  Negative for dizziness, tingling, focal weakness,  weakness and headaches.  Endo/Heme/Allergies:  Does not bruise/bleed easily.  Psychiatric/Behavioral:  Negative for depression. The patient is not nervous/anxious and does not have insomnia.      MEDICAL HISTORY:  Past Medical History:  Diagnosis Date   Arthritis    BPH (benign prostatic hyperplasia)    BPH with obstruction/lower urinary tract symptoms    Diabetes (HCC)    Disorder resulting from impaired renal function    Elevated PSA    Family history of lung cancer    Family history of lymphoma    Heart murmur    Hypertension    Knee pain    Lesion of lung    Low HDL (under 40)    Obesity    Urinary hesitancy     SURGICAL HISTORY: Past Surgical History:  Procedure Laterality Date   HERNIA REPAIR  2012   Umbilical Hernia Repair   KNEE ARTHROSCOPY Left 04/10/2015   Procedure: ARTHROSCOPY KNEE, partial medial meniscectomy, partial synovectomy;  Surgeon: Kennedy Bucker, MD;  Location: [facility] ORS;  Service: Orthopedics;  Laterality: Left;    SOCIAL HISTORY: Social History   Socioeconomic History   Marital status: Widowed    Spouse name: Not on file   Number of children: Not on file   Years of education: Not on file   Highest education level: Not on file  Occupational History   Not on file  Tobacco Use   Smoking status: Every Day    Current packs/day: 1.00    Types: Cigarettes   Smokeless tobacco: Never  Vaping Use   Vaping status: Never Used  Substance and Sexual Activity   Alcohol use: No    Alcohol/week: 0.0 standard drinks of alcohol   Drug use: No   Sexual activity: Not Currently  Other Topics Concern   Not on file  Social History Narrative   Lives in [place]; self; daughter lives 1 mile. Smoke 1ppd > 65 years; stopped 25 years ago.  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    Social Drivers of Corporate investment banker Strain: Not on file  Food Insecurity: No Food Insecurity (04/08/2023)   Hunger Vital Sign    Worried About  Running Out of Food in the Last Year: Never true    Ran Out of Food in the Last Year: Never true  Transportation Needs: No Transportation Needs (04/08/2023)   PRAPARE - Administrator, Civil Service (Medical): No    Lack of Transportation (Non-Medical): No  Physical Activity: Not on file  Stress: Not on file  Social Connections: Moderately Integrated (04/08/2023)   Social Connection and Isolation Panel [NHANES]    Frequency of Communication with Friends and Family: More than three times a week    Frequency of Social Gatherings with Friends and Family: More than three times a week    Attends Religious Services: 1 to 4 times per year    Active Member of [employer] or Organizations: Yes    Attends Banker Meetings: 1 to 4 times per year    Marital Status: Widowed  Intimate Partner Violence: Not At Risk (04/08/2023)   Humiliation, Afraid, Rape, and Kick questionnaire    Fear of Current or Ex-Partner: No    Emotionally Abused: No    Physically Abused: No    Sexually Abused: No    FAMILY HISTORY: Family History  Problem Relation Age of Onset   Diabetes  Mother    Lung cancer Mother    Lymphoma Son 10       died in 6.    Kidney disease Neg Hx    Prostate cancer Neg Hx     ALLERGIES:  has no known allergies.  MEDICATIONS:  Current Outpatient Medications  Medication Sig Dispense Refill   telmisartan (MICARDIS) 20 MG tablet Take 1 tablet by mouth daily.     acetaminophen (TYLENOL) 650 MG CR tablet Take 650 mg by mouth every 8 (eight) hours as needed for pain.     albuterol (PROVENTIL HFA;VENTOLIN HFA) 108 (90 BASE) MCG/ACT inhaler Inhale 2 puffs into the lungs every 6 (six) hours as needed for wheezing or shortness of breath.     aspirin 81 MG tablet Take 81 mg by mouth daily.     clopidogrel (PLAVIX) 75 MG tablet Take 1 tablet (75 mg total) by mouth daily. 21 tablet 0   cyanocobalamin 2000 MCG tablet Take 1 tablet (2,000 mcg total) by mouth daily. 90 tablet 1    enzalutamide (XTANDI) 40 MG tablet Take 3 tablets (120 mg total) by mouth daily. 90 tablet 3   finasteride (PROSCAR) 5 MG tablet TAKE 1 TABLET BY MOUTH EVERY DAY 90 tablet 3   furosemide (LASIX) 20 MG tablet Take 1 tablet (20 mg total) by mouth daily. 15 tablet 0   GLUMETZA 1000 MG 24 hr tablet Take 1,000 mg by mouth 2 (two) times daily.     metFORMIN (GLUCOPHAGE) 1000 MG tablet Take 1,000 mg by mouth 2 (two) times daily with a meal.     rosuvastatin (CRESTOR) 20 MG tablet Take 2 tablets (40 mg total) by mouth at bedtime. 60 tablet 6   thiamine 100 MG tablet TAKE 1 TABLET BY MOUTH EVERY DAY 90 tablet 1   traMADol (ULTRAM) 50 MG tablet Take 1 tablet (50 mg total) by mouth daily as needed. 60 tablet 0   No current facility-administered medications for this visit.      Marland Kitchen  PHYSICAL EXAMINATION: ECOG PERFORMANCE STATUS: 0 - Asymptomatic  Vitals:   05/20/23 1511  BP: (!) 153/74  Pulse: 71  Resp: 18  Temp: 98.2 F (36.8 C)  SpO2: 100%       Filed Weights   05/20/23 1511  Weight: 213 lb 9.6 oz (96.9 kg)       Physical Exam HENT:     Head: Normocephalic and atraumatic.     Mouth/Throat:     Pharynx: No oropharyngeal exudate.  Eyes:     Pupils: Pupils are equal, round, and reactive to light.  Cardiovascular:     Rate and Rhythm: Normal rate and regular rhythm.  Pulmonary:     Effort: No respiratory distress.     Breath sounds: No wheezing.     Comments: Decreased breath sounds bilaterally. Abdominal:     General: Bowel sounds are normal. There is no distension.     Palpations: Abdomen is soft. There is no mass.     Tenderness: There is no abdominal tenderness. There is no guarding or rebound.  Musculoskeletal:        General: No tenderness. Normal range of motion.     Cervical back: Normal range of motion and neck supple.  Skin:    General: Skin is warm.  Neurological:     Mental Status: He is alert and oriented to person, place, and time.  Psychiatric:         Mood and Affect: Affect normal.  LABORATORY DATA:  I have reviewed the data as listed Lab Results  Component Value Date   WBC 11.5 (H) 05/20/2023   HGB 11.7 (L) 05/20/2023   HCT 36.4 (L) 05/20/2023   MCV 85.4 05/20/2023   PLT 371 05/20/2023   Recent Labs    03/22/23 1430 04/07/23 1605 05/20/23 1502  NA 136 140 134*  K 3.9 4.0 3.7  CL 101 107 102  CO2 24 23 23   GLUCOSE 161* 106* 148*  BUN 13 16 18   CREATININE 0.97 0.98 1.21  CALCIUM 9.8 9.3 9.2  GFRNONAA >60 >60 59*  PROT 7.3 7.0 7.2  ALBUMIN 3.9 3.6 3.4*  AST 14* 10* 11*  ALT 8 7 10   ALKPHOS 110 92 121  BILITOT 0.5 0.6 0.5    Latest Reference Range & Units 01/23/20 12:22 03/10/20 14:11 03/19/20 09:19 04/16/20 09:10 05/14/20 10:06 06/13/20 09:26 07/11/20 10:13 08/07/20 09:24 09/04/20 10:13 09/16/20 13:59 10/21/20 13:59 12/23/20 13:27  Prostatic Specific Antigen 0.00 - 4.00 ng/mL 986.00 (H) 34.69 (H) 21.59 (H) 2.68 1.21 0.71 0.68 0.41 0.36 0.28 0.29 0.28  (H): Data is abnormally high  RADIOGRAPHIC STUDIES: I have personally reviewed the radiological images as listed and agreed with the findings in the report. No results found.  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 Some of the foci are difficult to define CT correlation.  Single focus of radiotracer activity in the anterior RIGHT lower rib is concerning for solitary rib metastasis.will ask radiology re: comparison to PSMA scan in 2022.  In comparison to PSMA PET scan 11/04/2020, all the radiotracer avid lesions are new  from prior;  Cystic pulmonary lesion in the RIGHT upper lobe does not have radiotracer activity and is increased in size from 2022.   # Patient currently on X-tandi 120 mg/day.  I at length discussed with patient that PET scan shows progression-although Lesions are subcentimeter in size; also PSA is rising.  Discussed given progression could consider subsequent lines of therapy including-chemotherapy Taxotere/Pluvicto.  Patient is a borderline candidate for Taxotere.  Discussed with Dr.Edmunds-and patient candidate for Pluvicto.  Patient understands Pluvicto is radiation treatment given every 6 weeks x 6 to 8 cycles.  Understands treatments are palliative not curative.  And I have sent message regarding Pluvicto option.  Will make a referral to [facility].   # Hot flashes- sec to ADT- monitor for now.   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  CT in  July-2024- Stable mixed lesion in the right upper lobe. Recommend continued surveillance. No new dominant mass, fluid collection or lymph node enlargement in the thorax. Stable- on most recent PET scan in OCT 2024.     # Chest tenderness-grade 1-2. Sec to ADT- continue tramadol prn-refiled; add vaseline BID- stable.   * eliagrd 45 11/12//2024; PSA- poor surrogate.   # DISPOSITION:   # Follow up in 2 month - MD-;labs- cbc/cmp/PSA; Eligard - Dr.B  # I reviewed the blood work- with the patient in detail; also reviewed the imaging independently [as summarized above]; and  with the patient in detail.    All questions were answered. The patient knows to call the clinic with any problems, questions or concerns.    Earna Coder, MD 05/20/2023 3:54 PM

## 2023-05-20 NOTE — Assessment & Plan Note (Addendum)
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 Some of the foci are difficult to define CT correlation.  Single focus of radiotracer activity in the anterior RIGHT lower rib is concerning for solitary rib metastasis.will ask radiology re: comparison to PSMA scan in 2022.  In comparison to PSMA PET scan 11/04/2020, all the radiotracer avid lesions are new from prior;  Cystic pulmonary lesion in the RIGHT upper lobe does not have radiotracer activity and is increased in size from 2022.   # Patient currently on X-tandi 120 mg/day.  I at length discussed with patient that PET scan shows progression-although Lesions are subcentimeter in size; also PSA is rising.  Discussed given progression could consider subsequent lines of therapy including-chemotherapy Taxotere/Pluvicto.  Patient is a borderline candidate for Taxotere.  Discussed with Dr.Edmunds-and patient candidate for Pluvicto.  Patient understands Pluvicto is radiation treatment given every 6 weeks x 6 to 8 cycles.  Understands treatments are palliative not curative.  And I have sent message regarding Pluvicto option.  Will make a referral to [facility].   # Hot flashes- sec to ADT- monitor for now.   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  CT in  July-2024- Stable mixed lesion in the right upper lobe. Recommend continued surveillance. No new dominant mass, fluid collection or lymph node enlargement in the thorax. Stable- on most recent PET scan in OCT 2024.     # Chest tenderness-grade 1-2. Sec to ADT-  continue tramadol prn-refiled; add vaseline BID- stable.   * eliagrd 45 11/12//2024; PSA- poor surrogate.   # DISPOSITION:   # Follow up in 2 month - MD-;labs- cbc/cmp/PSA; Eligard - Dr.B  # I reviewed the blood work- with the patient in detail; also reviewed the imaging independently [as summarized above]; and with the patient in detail.

## 2023-05-23 ENCOUNTER — Other Ambulatory Visit: Payer: Self-pay | Admitting: Internal Medicine

## 2023-05-23 DIAGNOSIS — C61 Malignant neoplasm of prostate: Secondary | ICD-10-CM

## 2023-05-25 ENCOUNTER — Telehealth: Payer: Self-pay | Admitting: *Deleted

## 2023-05-25 ENCOUNTER — Telehealth: Payer: Self-pay | Admitting: Internal Medicine

## 2023-05-25 NOTE — Telephone Encounter (Signed)
 Authorization is still pending for Pluvicto Therapy.

## 2023-05-25 NOTE — Telephone Encounter (Signed)
 I put in secure chat for andrea and Trula Ore to get PA. Put michelle to the secure chat to get michele. Telephone for Francine Graven is [phone]

## 2023-05-25 NOTE — Telephone Encounter (Signed)
 I spoke to pt's brother Leonette Most- re: denial of pluvicto  Discussed trial of chemo- and then consider pluvicto based on his tolerance.encouraged that pt's brother accompany pt at next visit.   Please have pt follow up in next 1-2 weeks- MD: no labs-  GB

## 2023-05-26 ENCOUNTER — Encounter (HOSPITAL_COMMUNITY)

## 2023-05-31 ENCOUNTER — Other Ambulatory Visit (HOSPITAL_COMMUNITY): Payer: Self-pay

## 2023-05-31 ENCOUNTER — Encounter: Payer: Self-pay | Admitting: Internal Medicine

## 2023-05-31 ENCOUNTER — Other Ambulatory Visit: Payer: Self-pay

## 2023-05-31 NOTE — Progress Notes (Signed)
 Specialty Pharmacy Refill Coordination Note  Brent Glass is a 83 y.o. male contacted today regarding refills of specialty medication(s) Enzalutamide [name])   Patient requested (Patient-Rptd) Delivery   Delivery date: (Patient-Rptd) 06/17/23   Verified address: (Patient-Rptd) [address] [place] [address]   Medication will be filled on 06/16/23.

## 2023-06-07 ENCOUNTER — Inpatient Hospital Stay: Attending: Internal Medicine | Admitting: Internal Medicine

## 2023-06-07 ENCOUNTER — Encounter: Payer: Self-pay | Admitting: Internal Medicine

## 2023-06-07 VITALS — BP 170/69 | HR 69 | Temp 98.6°F | Resp 16 | Wt 215.0 lb

## 2023-06-07 DIAGNOSIS — Z7902 Long term (current) use of antithrombotics/antiplatelets: Secondary | ICD-10-CM | POA: Insufficient documentation

## 2023-06-07 DIAGNOSIS — Z7984 Long term (current) use of oral hypoglycemic drugs: Secondary | ICD-10-CM | POA: Diagnosis not present

## 2023-06-07 DIAGNOSIS — C61 Malignant neoplasm of prostate: Secondary | ICD-10-CM | POA: Diagnosis present

## 2023-06-07 DIAGNOSIS — Z7982 Long term (current) use of aspirin: Secondary | ICD-10-CM | POA: Insufficient documentation

## 2023-06-07 DIAGNOSIS — F17210 Nicotine dependence, cigarettes, uncomplicated: Secondary | ICD-10-CM | POA: Diagnosis not present

## 2023-06-07 DIAGNOSIS — Z5111 Encounter for antineoplastic chemotherapy: Secondary | ICD-10-CM | POA: Diagnosis present

## 2023-06-07 DIAGNOSIS — Z5189 Encounter for other specified aftercare: Secondary | ICD-10-CM | POA: Insufficient documentation

## 2023-06-07 DIAGNOSIS — C7800 Secondary malignant neoplasm of unspecified lung: Secondary | ICD-10-CM | POA: Insufficient documentation

## 2023-06-07 DIAGNOSIS — E119 Type 2 diabetes mellitus without complications: Secondary | ICD-10-CM | POA: Diagnosis not present

## 2023-06-07 DIAGNOSIS — Z79899 Other long term (current) drug therapy: Secondary | ICD-10-CM | POA: Diagnosis not present

## 2023-06-07 DIAGNOSIS — C7951 Secondary malignant neoplasm of bone: Secondary | ICD-10-CM | POA: Insufficient documentation

## 2023-06-07 NOTE — Progress Notes (Signed)
START ON PATHWAY REGIMEN - Prostate ? ? ?  A cycle is every 21 days: ?    Prednisone  ?    Docetaxel  ? ?**Always confirm dose/schedule in your pharmacy ordering system** ? ?Patient Characteristics: ?Adenocarcinoma, Recurrent/New Systemic Disease (Including Biochemical Recurrence), Castration Resistant, M1, Prior Novel Hormonal Agent, No Molecular Alteration or Targeted Therapy Exhausted, No Prior Docetaxel ?Histology: Adenocarcinoma ?Therapeutic Status: Recurrent/New Systemic Disease (Including Biochemical Recurrence) ? ?Intent of Therapy: ?Non-Curative / Palliative Intent, Discussed with Patient ?

## 2023-06-07 NOTE — Assessment & Plan Note (Addendum)
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 Some of the foci are difficult to define CT correlation.  Single focus of radiotracer activity in the anterior RIGHT lower rib is concerning for solitary rib metastasis.will ask radiology re: comparison to PSMA scan in 2022.  In comparison to PSMA PET scan 11/04/2020, all the radiotracer avid lesions are new from prior;  Cystic pulmonary lesion in the RIGHT upper lobe does not have radiotracer activity and is increased in size from 2022. NGS-F-ONE 2022- NEGATIVE for any targets.   # Patient currently on X-tandi 120 mg/day.  I at length discussed with patient that PET scan shows progression-although Lesions are subcentimeter in size; also PSA is rising.  Discussed regarding use of Pluvicto-however insurance declined as patient has not had Taxotere previously.  # Given castrate resistant prostate cancer-recommend Taxotere chemotherapy-every 3 weeks x 6 cycles.  Patient will get growth factor on day 2.  Will dose reduce to 60 mg/m. #  I reviewed at length the individual components with chemotherapy; and the schedule in detail. I also discussed the potential side effects including but not limited to-increasing fatigue, nausea vomiting, diarrhea, hair loss, sores in the mouth, increase risk of infection and also neuropathy.  Understands treatments are palliative not curative.   # Hot flashes- sec to ADT- monitor for now.   # RUL nodule- ~2-3cm; cystic [on PET AUG 2022]-  No new dominant mass, fluid collection or lymph node enlargement in the thorax. Stable- on most recent PET scan in OCT 2024.   Will monitor on chemotherapy.    # Chest tenderness-grade 1-2. Sec to ADT- continue tramadol prn-refiled; add vaseline BID- stable.   # Chemotherapy education/nutrition evaluation: port placement. Plan start chemotherapy Sent the scripts for antiemetics-Zofran and Compazine; EMLA cream sent to pharmacy  # DM-on metformin-oral hypoglycemic drugs.  Recommend teaching with PCP regarding glucose monitor.  Reminded to check BGs.   # IV access: poor; recommend Mediport placement.  # ACP: discussed-given the palliative nature of the treatment we discussed the CODE STATUS.  Patient for now will consider FULL code.   * eliagrd 45 11/12//2024- due on mid MAY 2025; PSA- poor surrogate.   # DISPOSITION:   # cancel may appts.  # refer to Chemo education ASAP- re: chemo- taxoetere # refer to IR re: port placement # refer to Nutrition/speech path re: cancer/concern for malnutrition  # Follow up in 2  weeks-- MD-;labs- cbc/cmp/PSA; Taxoetere chemo; D-2 injection--Dr.B  # I reviewed the blood work- with the patient in detail; also reviewed the imaging independently [as summarized above]; and with the patient in detail.   # 40 minutes face-to-face with the patient discussing the above plan of care; more than 50% of time spent on prognosis/ natural history; counseling and coordination.

## 2023-06-07 NOTE — Progress Notes (Signed)
 Patient has been having high BP readings here recently, and today his BP was 170/69, so about 5 to 6 minutes later I rechecked it manually and it was 160/88. I can recheck before patient leaves the clinic today if needed. He has no new questions or concerns for the doctor today.

## 2023-06-07 NOTE — Progress Notes (Signed)
 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   # NOV 2021- CTA [ER] Diffuse bilateral pulmonary nodules including a cavitary lesion in the right upper lobe measuring approximately 2 cm. There is extensive nodular pleural thickening bilaterally, greatest in the left lower lobe.  November 2021 PET scan-multiple lung lesions multiple bone lesions and prostate uptake.  February 2022-prostate biopsy [Dr.Brandon]   # DM-2; NO COPD [?]; ACTIVE SMOKER 65ppd.  # DEC 2nd week- FIRMAGON; # JAN 12TH, 2022-ZYTIGA  1000 MG+ PRED 5MG ; FEB MID 2022- ELIGARD q6M; MARCH 2022- DISCONTINUE ZYTIGA sec to PRESS [severe hypokalemia/mental status changes hospitalization];  #   # MAY 6th, 2022- XTnadi 3pills/day   # NGS/MOLECULAR TESTS: MARCH 2022-foundation 1 no targetable mutation./Genetic evaluation negative    # PALLIATIVE CARE EVALUATION:  # PAIN MANAGEMENT:    DIAGNOSIS: Prostate cancer  STAGE:   IV      ;  GOALS: Palliative  CURRENT/MOST RECENT THERAPY : ADT+ Xtandi    Prostate cancer metastatic to multiple sites [facility])  02/04/2020 Initial Diagnosis   Prostate cancer metastatic to multiple sites [facility])   03/19/2020 Cancer Staging   Staging form: Prostate, AJCC 8th Edition - Clinical: Stage IVB (pM1b) - Signed by Earna Coder, MD on 03/19/2020    Genetic Testing   Negative genetic testing. No pathogenic variants identified on the [facility] Multi-Cancer Panel. VUS in AXIN2 called c.1235A>C identified. The report date is 03/23/2020.   The Multi-Cancer Panel offered by [facility] includes sequencing and/or deletion duplication testing of the  following 84 genes: AIP, ALK, APC, ATM, AXIN2,BAP1,  BARD1, BLM, BMPR1A, BRCA1, BRCA2, BRIP1, CASR, CDC73, CDH1, CDK4, CDKN1B, CDKN1C, CDKN2A (p14ARF), CDKN2A (p16INK4a), CEBPA, CHEK2, CTNNA1, DICER1, DIS3L2, EGFR (c.2369C>T, p.Thr790Met variant only), EPCAM (Deletion/duplication testing only), FH, FLCN, GATA2, GPC3, GREM1 (Promoter region deletion/duplication testing only), HOXB13 (c.251G>A, p.Gly84Glu), HRAS, KIT, MAX, MEN1, MET, MITF (c.952G>A, p.Glu318Lys variant only), MLH1, MSH2, MSH3, MSH6, MUTYH, NBN, NF1, NF2, NTHL1, PALB2, PDGFRA, PHOX2B, PMS2, POLD1, POLE, POT1, PRKAR1A, PTCH1, PTEN, RAD50, RAD51C, RAD51D, RB1, RECQL4, RET, RUNX1, SDHAF2, SDHA (sequence changes only), SDHB, SDHC, SDHD, SMAD4, SMARCA4, SMARCB1, SMARCE1, STK11, SUFU, TERC, TERT, TMEM127, TP53, TSC1, TSC2, VHL, WRN and WT1.    06/29/2023 -  Chemotherapy   Patient is on Treatment Plan : PROSTATE Docetaxel (75) + Prednisone q21d       HISTORY OF PRESENTING ILLNESS: Ambulating independently. With sister.   Brent Glass 83 y.o.  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   Patient has been having high BP readings here recently, and today his BP was 170/69, so about 5 to 6 minutes later I rechecked it manually and it was 160/88. I can recheck before patient leaves the clinic today if needed. He has no new questions or concerns for the doctor toda   No falls.  He states that he is compliant with his oral Xtandi. No fever no chills.  No constipation no diarrhea.  No nausea no vomiting. Denies any worsening pain.   Review of Systems  Constitutional:  Positive for malaise/fatigue. Negative for chills, diaphoresis and fever.  HENT:  Negative for nosebleeds and sore throat.  Eyes:  Negative for double vision.  Respiratory:  Negative for hemoptysis, sputum production, shortness of breath and wheezing.   Cardiovascular:  Negative for chest pain, palpitations, orthopnea  and leg swelling.  Gastrointestinal:  Negative for blood in stool, constipation, diarrhea, heartburn, melena, nausea and vomiting.  Genitourinary:  Negative for dysuria, frequency and urgency.  Musculoskeletal:  Positive for back pain and myalgias. Negative for joint pain.  Skin: Negative.  Negative for itching and rash.  Neurological:  Negative for dizziness, tingling, focal weakness, weakness and headaches.  Endo/Heme/Allergies:  Does not bruise/bleed easily.  Psychiatric/Behavioral:  Negative for depression. The patient is not nervous/anxious and does not have insomnia.      MEDICAL HISTORY:  Past Medical History:  Diagnosis Date   Arthritis    BPH (benign prostatic hyperplasia)    BPH with obstruction/lower urinary tract symptoms    Diabetes (HCC)    Disorder resulting from impaired renal function    Elevated PSA    Family history of lung cancer    Family history of lymphoma    Heart murmur    Hypertension    Knee pain    Lesion of lung    Low HDL (under 40)    Obesity    Urinary hesitancy     SURGICAL HISTORY: Past Surgical History:  Procedure Laterality Date   HERNIA REPAIR  2012   Umbilical Hernia Repair   KNEE ARTHROSCOPY Left 04/10/2015   Procedure: ARTHROSCOPY KNEE, partial medial meniscectomy, partial synovectomy;  Surgeon: Kennedy Bucker, MD;  Location: [facility] ORS;  Service: Orthopedics;  Laterality: Left;    SOCIAL HISTORY: Social History   Socioeconomic History   Marital status: Widowed    Spouse name: Not on file   Number of children: Not on file   Years of education: Not on file   Highest education level: Not on file  Occupational History   Not on file  Tobacco Use   Smoking status: Every Day    Current packs/day: 1.00    Types: Cigarettes   Smokeless tobacco: Never  Vaping Use   Vaping status: Never Used  Substance and Sexual Activity   Alcohol use: No    Alcohol/week: 0.0 standard drinks of alcohol   Drug use: No   Sexual activity: Not  Currently  Other Topics Concern   Not on file  Social History Narrative   Lives in [place]; self; daughter lives 1 mile. Smoke 1ppd > 65 years; stopped 25 years ago.  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    Social Drivers of Corporate investment banker Strain: Not on file  Food Insecurity: No Food Insecurity (04/08/2023)   Hunger Vital Sign    Worried About Running Out of Food in the Last Year: Never true    Ran Out of Food in the Last Year: Never true  Transportation Needs: No Transportation Needs (04/08/2023)   PRAPARE - Administrator, Civil Service (Medical): No    Lack of Transportation (Non-Medical): No  Physical Activity: Not on file  Stress: Not on file  Social Connections: Moderately Integrated (04/08/2023)   Social Connection and Isolation Panel [NHANES]    Frequency of Communication with Friends and Family: More than three times a week    Frequency of Social Gatherings with Friends and Family: More than three times a week    Attends Religious Services: 1 to 4 times per year    Active Member of [employer] or Organizations: Yes  Attends Banker Meetings: 1 to 4 times per year    Marital Status: Widowed  Intimate Partner Violence: Not At Risk (04/08/2023)   Humiliation, Afraid, Rape, and Kick questionnaire    Fear of Current or Ex-Partner: No    Emotionally Abused: No    Physically Abused: No    Sexually Abused: No    FAMILY HISTORY: Family History  Problem Relation Age of Onset   Diabetes Mother    Lung cancer Mother    Lymphoma Son 28       died in 42.    Kidney disease Neg Hx    Prostate cancer Neg Hx     ALLERGIES:  has no known allergies.  MEDICATIONS:  Current Outpatient Medications  Medication Sig Dispense Refill   acetaminophen (TYLENOL) 650 MG CR tablet Take 650 mg by mouth every 8 (eight) hours as needed for pain.     albuterol (PROVENTIL HFA;VENTOLIN HFA) 108 (90 BASE) MCG/ACT inhaler Inhale 2 puffs  into the lungs every 6 (six) hours as needed for wheezing or shortness of breath.     aspirin 81 MG tablet Take 81 mg by mouth daily.     clopidogrel (PLAVIX) 75 MG tablet Take 1 tablet (75 mg total) by mouth daily. 21 tablet 0   cyanocobalamin 2000 MCG tablet Take 1 tablet (2,000 mcg total) by mouth daily. 90 tablet 1   enzalutamide (XTANDI) 40 MG tablet Take 3 tablets (120 mg total) by mouth daily. 90 tablet 3   finasteride (PROSCAR) 5 MG tablet TAKE 1 TABLET BY MOUTH EVERY DAY 90 tablet 3   furosemide (LASIX) 20 MG tablet Take 1 tablet (20 mg total) by mouth daily. 15 tablet 0   GLUMETZA 1000 MG 24 hr tablet Take 1,000 mg by mouth 2 (two) times daily.     metFORMIN (GLUCOPHAGE) 1000 MG tablet Take 1,000 mg by mouth 2 (two) times daily with a meal.     rosuvastatin (CRESTOR) 20 MG tablet Take 2 tablets (40 mg total) by mouth at bedtime. 60 tablet 6   telmisartan (MICARDIS) 20 MG tablet Take 1 tablet by mouth daily.     thiamine 100 MG tablet TAKE 1 TABLET BY MOUTH EVERY DAY 90 tablet 1   traMADol (ULTRAM) 50 MG tablet Take 1 tablet (50 mg total) by mouth daily as needed. 60 tablet 0   No current facility-administered medications for this visit.      Marland Kitchen  PHYSICAL EXAMINATION: ECOG PERFORMANCE STATUS: 0 - Asymptomatic  Vitals:   06/07/23 1427  BP: (!) 170/69  Pulse: 69  Resp: 16  Temp: 98.6 F (37 C)  SpO2: 100%        Filed Weights   06/07/23 1427  Weight: 215 lb (97.5 kg)        Physical Exam HENT:     Head: Normocephalic and atraumatic.     Mouth/Throat:     Pharynx: No oropharyngeal exudate.  Eyes:     Pupils: Pupils are equal, round, and reactive to light.  Cardiovascular:     Rate and Rhythm: Normal rate and regular rhythm.  Pulmonary:     Effort: No respiratory distress.     Breath sounds: No wheezing.     Comments: Decreased breath sounds bilaterally. Abdominal:     General: Bowel sounds are normal. There is no distension.     Palpations: Abdomen is  soft. There is no mass.     Tenderness: There is no abdominal tenderness. There is  no guarding or rebound.  Musculoskeletal:        General: No tenderness. Normal range of motion.     Cervical back: Normal range of motion and neck supple.  Skin:    General: Skin is warm.  Neurological:     Mental Status: He is alert and oriented to person, place, and time.  Psychiatric:        Mood and Affect: Affect normal.      LABORATORY DATA:  I have reviewed the data as listed Lab Results  Component Value Date   WBC 11.5 (H) 05/20/2023   HGB 11.7 (L) 05/20/2023   HCT 36.4 (L) 05/20/2023   MCV 85.4 05/20/2023   PLT 371 05/20/2023   Recent Labs    03/22/23 1430 04/07/23 1605 05/20/23 1502  NA 136 140 134*  K 3.9 4.0 3.7  CL 101 107 102  CO2 24 23 23   GLUCOSE 161* 106* 148*  BUN 13 16 18   CREATININE 0.97 0.98 1.21  CALCIUM 9.8 9.3 9.2  GFRNONAA >60 >60 59*  PROT 7.3 7.0 7.2  ALBUMIN 3.9 3.6 3.4*  AST 14* 10* 11*  ALT 8 7 10   ALKPHOS 110 92 121  BILITOT 0.5 0.6 0.5    Latest Reference Range & Units 01/23/20 12:22 03/10/20 14:11 03/19/20 09:19 04/16/20 09:10 05/14/20 10:06 06/13/20 09:26 07/11/20 10:13 08/07/20 09:24 09/04/20 10:13 09/16/20 13:59 10/21/20 13:59 12/23/20 13:27  Prostatic Specific Antigen 0.00 - 4.00 ng/mL 986.00 (H) 34.69 (H) 21.59 (H) 2.68 1.21 0.71 0.68 0.41 0.36 0.28 0.29 0.28  (H): Data is abnormally high  RADIOGRAPHIC STUDIES: I have personally reviewed the radiological images as listed and agreed with the findings in the report. No results found.  ASSESSMENT & PLAN:   Prostate cancer metastatic to multiple sites [facility]) # prostate cancer castrate resistant metastatic to lung/bone [NOV PSA 900-NOV 2021]; On Eligard;  OCT 1st, 2024-  Single focus of intense radiotracer activity in the posterior aspect of the RIGHT lobe of the prostate gland is concerning for local prostate adenocarcinoma recurrence;  Several foci of intense radiotracer activity within the  lung parenchyma and pleural surfaces are concerning for metastatic prostate cancer. Some of the foci are difficult to define CT correlation.  Single focus of radiotracer activity in the anterior RIGHT lower rib is concerning for solitary rib metastasis.will ask radiology re: comparison to PSMA scan in 2022.  In comparison to PSMA PET scan 11/04/2020, all the radiotracer avid lesions are new from prior;  Cystic pulmonary lesion in the RIGHT upper lobe does not have radiotracer activity and is increased in size from 2022. NGS-F-ONE 2022- NEGATIVE for any targets.   # Patient currently on X-tandi 120 mg/day.  I at length discussed with patient that PET scan shows progression-although Lesions are subcentimeter in size; also PSA is rising.  Discussed regarding use of Pluvicto-however insurance declined as patient has not had Taxotere previously.  # Given castrate resistant prostate cancer-recommend Taxotere chemotherapy-every 3 weeks x 6 cycles.  Patient will get growth factor on day 2.  Will dose reduce to 60 mg/m. #  I reviewed at length the individual components with chemotherapy; and the schedule in detail. I also discussed the potential side effects including but not limited to-increasing fatigue, nausea vomiting, diarrhea, hair loss, sores in the mouth, increase risk of infection and also neuropathy.  Understands treatments are palliative not curative.   # Hot flashes- sec to ADT- monitor for now.   # RUL nodule- ~2-3cm; cystic [on  PET AUG 2022]-  No new dominant mass, fluid collection or lymph node enlargement in the thorax. Stable- on most recent PET scan in OCT 2024.  Will monitor on chemotherapy.    # Chest tenderness-grade 1-2. Sec to ADT- continue tramadol prn-refiled; add vaseline BID- stable.   # Chemotherapy education/nutrition evaluation: port placement. Plan start chemotherapy Sent the scripts for antiemetics-Zofran and Compazine; EMLA cream sent to pharmacy  # DM-on metformin-oral  hypoglycemic drugs.  Recommend teaching with PCP regarding glucose monitor.  Reminded to check BGs.   # IV access: poor; recommend Mediport placement.  # ACP: discussed-given the palliative nature of the treatment we discussed the CODE STATUS.  Patient for now will consider FULL code.   * eliagrd 45 11/12//2024- due on mid MAY 2025; PSA- poor surrogate.   # DISPOSITION:   # cancel may appts.  # refer to Chemo education ASAP- re: chemo- taxoetere # refer to IR re: port placement # refer to Nutrition/speech path re: cancer/concern for malnutrition  # Follow up in 2  weeks-- MD-;labs- cbc/cmp/PSA; Taxoetere chemo; D-2 injection--Dr.B  # I reviewed the blood work- with the patient in detail; also reviewed the imaging independently [as summarized above]; and with the patient in detail.   # 40 minutes face-to-face with the patient discussing the above plan of care; more than 50% of time spent on prognosis/ natural history; counseling and coordination.    All questions were answered. The patient knows to call the clinic with any problems, questions or concerns.    Earna Coder, MD 06/07/2023 4:00 PM

## 2023-06-08 ENCOUNTER — Other Ambulatory Visit: Payer: Self-pay

## 2023-06-08 ENCOUNTER — Encounter: Payer: Self-pay | Admitting: *Deleted

## 2023-06-13 ENCOUNTER — Other Ambulatory Visit

## 2023-06-14 ENCOUNTER — Other Ambulatory Visit: Payer: Self-pay | Admitting: *Deleted

## 2023-06-14 ENCOUNTER — Inpatient Hospital Stay

## 2023-06-14 ENCOUNTER — Encounter: Payer: Self-pay | Admitting: Internal Medicine

## 2023-06-14 ENCOUNTER — Encounter

## 2023-06-14 MED ORDER — LIDOCAINE-PRILOCAINE 2.5-2.5 % EX CREA: 1.0000 | TOPICAL_CREAM | CUTANEOUS | 1 refills | Status: AC | PRN

## 2023-06-14 MED ORDER — ONDANSETRON HCL 8 MG PO TABS: 8.0000 mg | ORAL_TABLET | Freq: Three times a day (TID) | ORAL | 0 refills | Status: AC | PRN

## 2023-06-14 MED ORDER — PROCHLORPERAZINE MALEATE 10 MG PO TABS: 10.0000 mg | ORAL_TABLET | Freq: Four times a day (QID) | ORAL | 2 refills | Status: AC | PRN

## 2023-06-14 NOTE — Progress Notes (Signed)
 Pharmacist Chemotherapy Monitoring - Initial Assessment    Anticipated start date: 06/28/23   The following has been reviewed per standard work regarding the patient's treatment regimen: The patient's diagnosis, treatment plan and drug doses, and organ/hematologic function Lab orders and baseline tests specific to treatment regimen  The treatment plan start date, drug sequencing, and pre-medications Prior authorization status  Patient's documented medication list, including drug-drug interaction screen and prescriptions for anti-emetics and supportive care specific to the treatment regimen The drug concentrations, fluid compatibility, administration routes, and timing of the medications to be used The patient's access for treatment and lifetime cumulative dose history, if applicable  The patient's medication allergies and previous infusion related reactions, if applicable   Changes made to treatment plan:  N/A  Follow up needed:  N/A   Sharen Hones, RPH, 06/14/2023  1:11 PM

## 2023-06-14 NOTE — Progress Notes (Signed)
 Nutrition Assessment   Reason for Assessment:  Starting chemotherapy   ASSESSMENT:  83 year old male with progression of prostate cancer.  Past medical history of prostate cancer dx in 2021, BPH, DM, HTN.  Planning docetaxel and prednisone.    Met with patient and sister, Westley Hummer prior to appointment as finished with chemo education class.  Reports normal/good appetite currently.  Eats brunch of sometimes cereal, bacon, eggs toast.  Supper may be fast food or Hortense Ramal. Patient lives alone and prepares meals or goes out to eat.       Medications: Vit B 12 2000 mcg, thiamine 100 mg, metformin, xtandi   Labs: Na 134, glucose 148   Anthropometrics:   Height: 72 inches Weight: 215 lb on 4/1 UBW: 210-216 lb BMI: 29  Weight stable   NUTRITION DIAGNOSIS: none at this time   INTERVENTION:  Discussed importance of good nutrition during treatment Message sent to team regarding prescriptions for antinausea medications and EMLA cream as patient has not heard from his pharmacy yet.  Encouraged foods rich in protein Contact information provided   MONITORING, EVALUATION, GOAL: weight trends, intake   Next Visit: Tuesday, April 22nd during infusion  Dekendrick Uzelac B. Elease Hashimoto, CSO, LDN Registered Dietitian [phone]

## 2023-06-16 ENCOUNTER — Other Ambulatory Visit: Payer: Self-pay

## 2023-06-20 ENCOUNTER — Ambulatory Visit: Admitting: Radiology

## 2023-06-20 ENCOUNTER — Other Ambulatory Visit: Payer: Self-pay | Admitting: Radiology

## 2023-06-20 NOTE — Progress Notes (Signed)
 Patient for IR Port Insertion on Tues 06/21/23, I called and spoke with the patient's sister, Urban Garden on the phone and gave pre-procedure instructions. Urban Garden was made aware to be here at 10:30a, NPO after MN prior to procedure as well as driver post procedure/recovery/discharge. Charlene stated understanding.  Called 06/20/23

## 2023-06-21 ENCOUNTER — Ambulatory Visit
Admission: RE | Admit: 2023-06-21 | Discharge: 2023-06-21 | Disposition: A | Discharge status: Home / Self Care | Source: Ambulatory Visit | Attending: Internal Medicine | Admitting: Internal Medicine

## 2023-06-21 ENCOUNTER — Encounter: Admitting: Speech Pathology

## 2023-06-21 DIAGNOSIS — E119 Type 2 diabetes mellitus without complications: Secondary | ICD-10-CM | POA: Insufficient documentation

## 2023-06-21 DIAGNOSIS — Z77090 Contact with and (suspected) exposure to asbestos: Secondary | ICD-10-CM | POA: Diagnosis not present

## 2023-06-21 DIAGNOSIS — F17210 Nicotine dependence, cigarettes, uncomplicated: Secondary | ICD-10-CM | POA: Diagnosis not present

## 2023-06-21 DIAGNOSIS — C61 Malignant neoplasm of prostate: Secondary | ICD-10-CM | POA: Diagnosis present

## 2023-06-21 DIAGNOSIS — Z807 Family history of other malignant neoplasms of lymphoid, hematopoietic and related tissues: Secondary | ICD-10-CM | POA: Insufficient documentation

## 2023-06-21 DIAGNOSIS — N401 Enlarged prostate with lower urinary tract symptoms: Secondary | ICD-10-CM | POA: Insufficient documentation

## 2023-06-21 DIAGNOSIS — C7951 Secondary malignant neoplasm of bone: Secondary | ICD-10-CM | POA: Insufficient documentation

## 2023-06-21 DIAGNOSIS — Z7984 Long term (current) use of oral hypoglycemic drugs: Secondary | ICD-10-CM | POA: Insufficient documentation

## 2023-06-21 DIAGNOSIS — Z833 Family history of diabetes mellitus: Secondary | ICD-10-CM | POA: Diagnosis not present

## 2023-06-21 DIAGNOSIS — Z801 Family history of malignant neoplasm of trachea, bronchus and lung: Secondary | ICD-10-CM | POA: Diagnosis not present

## 2023-06-21 HISTORY — PX: IR IMAGING GUIDED PORT INSERTION: IMG5740

## 2023-06-21 LAB — GLUCOSE, CAPILLARY: Glucose-Capillary: 137 mg/dL — ABNORMAL HIGH (ref 70–99)

## 2023-06-21 MED ORDER — SODIUM CHLORIDE 0.9 % IV SOLN: INTRAVENOUS | Status: DC

## 2023-06-21 MED ORDER — LIDOCAINE HCL 1 % IJ SOLN
INTRAMUSCULAR | Status: AC
  Filled 2023-06-21: qty 20

## 2023-06-21 MED ORDER — FENTANYL CITRATE (PF) 100 MCG/2ML IJ SOLN
INTRAMUSCULAR | Status: AC | PRN
  Administered 2023-06-21: 50 ug via INTRAVENOUS

## 2023-06-21 MED ORDER — MIDAZOLAM HCL 2 MG/2ML IJ SOLN
INTRAMUSCULAR | Status: AC | PRN
  Administered 2023-06-21: .5 mg via INTRAVENOUS
  Administered 2023-06-21: 1 mg via INTRAVENOUS

## 2023-06-21 MED ORDER — HEPARIN SOD (PORK) LOCK FLUSH 100 UNIT/ML IV SOLN
500.0000 [IU] | Freq: Once | INTRAVENOUS | Status: AC
  Administered 2023-06-21: 500 [IU] via INTRAVENOUS

## 2023-06-21 MED ORDER — MIDAZOLAM HCL 2 MG/2ML IJ SOLN
INTRAMUSCULAR | Status: AC
  Filled 2023-06-21: qty 2

## 2023-06-21 MED ORDER — FENTANYL CITRATE (PF) 100 MCG/2ML IJ SOLN
INTRAMUSCULAR | Status: AC
  Filled 2023-06-21: qty 2

## 2023-06-21 MED ORDER — HEPARIN SOD (PORK) LOCK FLUSH 100 UNIT/ML IV SOLN
INTRAVENOUS | Status: AC
  Filled 2023-06-21: qty 5

## 2023-06-21 MED ORDER — LIDOCAINE HCL 1 % IJ SOLN
20.0000 mL | Freq: Once | INTRAMUSCULAR | Status: AC
  Administered 2023-06-21: 20 mL via INTRADERMAL

## 2023-06-21 NOTE — Procedures (Signed)
 Interventional Radiology Procedure Note  Procedure: Single Lumen Power Port Placement    Access:  Right IJ vein.  Findings: Catheter tip positioned at SVC/RA junction. Port is ready for immediate use.   Complications: None  EBL: < 10 mL  Recommendations:  - Ok to shower in 24 hours - Do not submerge for 7 days - Routine line care   Maxi Carreras T. Fredia Sorrow, M.D Pager:  [phone]

## 2023-06-21 NOTE — H&P (Signed)
 Chief Complaint: Metastatic prostate cancer; request for image guided port insertion  Referring Provider(s): Earna Coder   Supervising Physician: Irish Lack  Patient Status: [facility] - Out-pt  History of Present Illness: Brent Glass is a 83 y.o. 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 Plan with oncology includes chemotherapy. No current plan for radiation therapy for right upper lobe nodule or R rib lesion per notes and patient interview. Oncologist discussed port placement at 06/07/2023 appointment, patient agrees with plan.  Patient takes Plavix and aspirin, not held for low bleeding risk procedure. He has been n.p.o. since midnight.  He has a ride and supervision for the next 24 hours.  He has previously had sedation without issue, does not wear CPAP at night.  Denies fevers, chills, abdominal pain, chest pain, shortness of breath, abnormal bleeding, dysuria, nausea, vomiting, leg swelling. Endorses R chest swelling without pain which has been chronic.    Patient is Full Code  Past Medical History:  Diagnosis Date   Arthritis    BPH (benign prostatic hyperplasia)    BPH with obstruction/lower urinary tract symptoms    Diabetes (HCC)    Disorder resulting from impaired renal function    Elevated PSA    Family history of lung cancer    Family history of lymphoma    Heart murmur    Hypertension    Knee pain    Lesion of lung    Low HDL (under 40)    Obesity    Urinary hesitancy     Past Surgical History:  Procedure Laterality Date   HERNIA REPAIR  2012   Umbilical Hernia Repair   KNEE ARTHROSCOPY Left 04/10/2015   Procedure: ARTHROSCOPY KNEE, partial medial meniscectomy, partial synovectomy;  Surgeon: Kennedy Bucker, MD;  Location: [facility] ORS;  Service: Orthopedics;  Laterality: Left;    Allergies: Patient has no known allergies.  Medications: Prior to Admission medications   Medication Sig Start Date End Date Taking?  Authorizing Provider  acetaminophen (TYLENOL) 650 MG CR tablet Take 650 mg by mouth every 8 (eight) hours as needed for pain.    [provider]  albuterol (PROVENTIL HFA;VENTOLIN HFA) 108 (90 BASE) MCG/ACT inhaler Inhale 2 puffs into the lungs every 6 (six) hours as needed for wheezing or shortness of breath.    [provider]  aspirin 81 MG tablet Take 81 mg by mouth daily.    [provider]  clopidogrel (PLAVIX) 75 MG tablet Take 1 tablet (75 mg total) by mouth daily. 04/10/23   Loyce Dys, MD  cyanocobalamin 2000 MCG tablet Take 1 tablet (2,000 mcg total) by mouth daily. 09/17/20   Earna Coder, MD  enzalutamide [name]) 40 MG tablet Take 3 tablets (120 mg total) by mouth daily. 05/10/23   Earna Coder, MD  finasteride (PROSCAR) 5 MG tablet TAKE 1 TABLET BY MOUTH EVERY DAY 11/09/17   McGowan, Carollee Herter A, PA-C  furosemide (LASIX) 20 MG tablet Take 1 tablet (20 mg total) by mouth daily. 10/26/21   Earna Coder, MD  GLUMETZA 1000 MG 24 hr tablet Take 1,000 mg by mouth 2 (two) times daily. 09/05/20   [provider]  lidocaine-prilocaine (EMLA) cream Apply 1 Application topically as needed. Apply to port and cover with saran wrap 1-2 hours prior to port access 06/14/23   Earna Coder, MD  metFORMIN (GLUCOPHAGE) 1000 MG tablet Take 1,000 mg by mouth 2 (two) times daily with a meal.  [provider]  ondansetron (ZOFRAN) 8 MG tablet Take 1 tablet (8 mg total) by mouth every 8 (eight) hours as needed for nausea or vomiting. 06/14/23   Brahmanday, Govinda R, MD  prochlorperazine (COMPAZINE) 10 MG tablet Take 1 tablet (10 mg total) by mouth every 6 (six) hours as needed for nausea or vomiting. 06/14/23   Brahmanday, Govinda R, MD  rosuvastatin (CRESTOR) 20 MG tablet Take 2 tablets (40 mg total) by mouth at bedtime. 04/09/23   Ezzard Holms, MD  telmisartan (MICARDIS) 20 MG tablet Take 1 tablet by mouth daily. 04/29/23 06/02/24  [provider]  thiamine 100 MG tablet TAKE 1 TABLET BY MOUTH EVERY DAY 09/17/20   Brahmanday, Govinda R, MD  traMADol (ULTRAM) 50 MG tablet Take 1 tablet (50 mg total) by mouth daily as needed. 09/30/22   Gwyn Leos, MD     Family History  Problem Relation Age of Onset   Diabetes Mother    Lung cancer Mother    Lymphoma Son 49       died in 67.    Kidney disease Neg Hx    Prostate cancer Neg Hx     Social History   Socioeconomic History   Marital status: Widowed    Spouse name: Not on file   Number of children: Not on file   Years of education: Not on file   Highest education level: Not on file  Occupational History   Not on file  Tobacco Use   Smoking status: Every Day    Current packs/day: 1.00    Types: Cigarettes   Smokeless tobacco: Never  Vaping Use   Vaping status: Never Used  Substance and Sexual Activity   Alcohol use: No    Alcohol/week: 0.0 standard drinks of alcohol   Drug use: No   Sexual activity: Not Currently  Other Topics Concern   Not on file  Social History Narrative   Lives in [place]; self; daughter lives 1 mile. Smoke 1ppd > 65 years; stopped 25 years ago.  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    Social Drivers of Corporate investment banker Strain: Not on file  Food Insecurity: No Food Insecurity (04/08/2023)   Hunger Vital Sign    Worried About Running Out of Food in the Last Year: Never true    Ran Out of Food in the Last Year: Never true  Transportation Needs: No Transportation Needs (04/08/2023)   PRAPARE - Administrator, Civil Service (Medical): No    Lack of Transportation (Non-Medical): No  Physical Activity: Not on file  Stress: Not on file  Social Connections: Moderately Integrated (04/08/2023)   Social Connection and Isolation Panel [NHANES]    Frequency of Communication with Friends and Family: More than three times a week    Frequency of Social Gatherings with Friends and  Family: More than three times a week    Attends Religious Services: 1 to 4 times per year    Active Member of [employer] or Organizations: Yes    Attends Banker Meetings: 1 to 4 times per year    Marital Status: Widowed     Review of Systems: A 12 point ROS discussed and pertinent positives are indicated in the HPI above.  All other systems are negative.  Review of Systems  Constitutional:  Negative for chills and fever.  Respiratory:  Negative for shortness of breath.   Cardiovascular:  Negative for chest pain.  Gastrointestinal:  Negative for abdominal pain, nausea and vomiting.  Genitourinary:  Positive for urgency. Negative for hematuria.  Skin:  Negative for color change.  Hematological:  Does not bruise/bleed easily.    Vital Signs: BP (!) 179/74   Pulse 72   Temp 98.2 F (36.8 C) (Oral)   Resp 17   Ht 6' (1.829 m)   Wt 212 lb (96.2 kg)   SpO2 98%   BMI 28.75 kg/m   Advance Care Plan: No documents on file   Physical Exam HENT:     Mouth/Throat:     Mouth: Mucous membranes are dry.     Pharynx: Oropharynx is clear.  Cardiovascular:     Rate and Rhythm: Normal rate and regular rhythm.     Pulses: Normal pulses.     Heart sounds: Normal heart sounds.  Pulmonary:     Effort: Pulmonary effort is normal.     Breath sounds: Normal breath sounds.  Abdominal:     Palpations: Abdomen is soft.     Tenderness: There is no abdominal tenderness. There is no guarding.  Skin:    General: Skin is warm and dry.     Coloration: Skin is not jaundiced.     Comments: Skin over chest without erythema or wound. R chest swelling endorses by patient not appreciable on exam  Neurological:     Mental Status: He is alert and oriented to person, place, and time.  Psychiatric:        Mood and Affect: Mood normal.        Behavior: Behavior normal.     Imaging: No results found.  Labs:  CBC: Recent Labs    01/18/23 1423 03/22/23 1430 04/07/23 1605 05/20/23 1502   WBC 10.8* 10.2 10.5 11.5*  HGB 11.9* 13.3 12.4* 11.7*  HCT 37.5* 42.3 38.5* 36.4*  PLT 501* 352 385 371    COAGS: Recent Labs    04/07/23 1605  INR 1.0  APTT 31    BMP: Recent Labs    01/18/23 1421 03/22/23 1430 04/07/23 1605 05/20/23 1502  NA 139 136 140 134*  K 3.8 3.9 4.0 3.7  CL 104 101 107 102  CO2 24 24 23 23   GLUCOSE 192* 161* 106* 148*  BUN 12 13 16 18   CALCIUM 9.7 9.8 9.3 9.2  CREATININE 1.02 0.97 0.98 1.21  GFRNONAA >60 >60 >60 59*    LIVER FUNCTION TESTS: Recent Labs    01/18/23 1421 03/22/23 1430 04/07/23 1605 05/20/23 1502  BILITOT 0.3 0.5 0.6 0.5  AST 11* 14* 10* 11*  ALT 7 8 7 10   ALKPHOS 126 110 92 121  PROT 6.9 7.3 7.0 7.2  ALBUMIN 3.6 3.9 3.6 3.4*    TUMOR MARKERS: No results for input(s): "AFPTM", "CEA", "CA199", "CHROMGRNA" in the last 8760 hours.  Assessment and Plan:  Request for image guided port placement approved by Dr. Fredia Sorrow for 06/21/2023. 3/14 labs reviewed. PET 12/16/22 available. VSS, afebrile.  No contraindications identified for procedure today.  Risks and benefits of image guided port-a-catheter placement was discussed with the patient including, but not limited to bleeding, infection, pneumothorax, or fibrin sheath development and need for additional procedures.  All of the patient's questions were answered, patient is agreeable to proceed. Consent signed and in chart.   Thank you for allowing our service to participate in Brent Glass 's care.    Electronically Signed: Carlton Adam, NP   06/21/2023, 10:51 AM     I spent a  total of  30 Minutes   in face to face in clinical consultation, greater than 50% of which was counseling/coordinating care for image guided port placement   (A copy of this note was sent to the referring provider and the time of visit.)

## 2023-06-22 ENCOUNTER — Ambulatory Visit: Attending: Internal Medicine | Admitting: Speech Pathology

## 2023-06-22 DIAGNOSIS — C61 Malignant neoplasm of prostate: Secondary | ICD-10-CM | POA: Diagnosis not present

## 2023-06-22 DIAGNOSIS — R1312 Dysphagia, oropharyngeal phase: Secondary | ICD-10-CM | POA: Diagnosis present

## 2023-06-22 NOTE — Therapy (Signed)
 OUTPATIENT SPEECH LANGUAGE PATHOLOGY  SWALLOW EVALUATION ONLY   Patient Name: Brent Glass MRN: [id number] DOB:05/12/1940, 83 y.o., male Today's Date: 06/22/2023  PCP: Oma Bias, MD REFERRING PROVIDER: Hedda Liv, MD   End of Session - 06/22/23 1230     Visit Number 1    Number of Visits 1    Authorization Type [employer] HMO    SLP Start Time 1010    SLP Stop Time  1030    SLP Time Calculation (min) 20 min    Activity Tolerance Patient tolerated treatment well             Past Medical History:  Diagnosis Date   Arthritis    BPH (benign prostatic hyperplasia)    BPH with obstruction/lower urinary tract symptoms    Diabetes (HCC)    Disorder resulting from impaired renal function    Elevated PSA    Family history of lung cancer    Family history of lymphoma    Heart murmur    Hypertension    Knee pain    Lesion of lung    Low HDL (under 40)    Obesity    Urinary hesitancy    Past Surgical History:  Procedure Laterality Date   HERNIA REPAIR  2012   Umbilical Hernia Repair   IR IMAGING GUIDED PORT INSERTION  06/21/2023   KNEE ARTHROSCOPY Left 04/10/2015   Procedure: ARTHROSCOPY KNEE, partial medial meniscectomy, partial synovectomy;  Surgeon: Molli Angelucci, MD;  Location: [facility] ORS;  Service: Orthopedics;  Laterality: Left;   Patient Active Problem List   Diagnosis Date Noted   Leg swelling 01/26/2022   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/2020   Hypoglycemia 05/31/2020   Palliative care encounter    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   Acute confusion 05/27/2020   Left-sided weakness 05/27/2020   Diabetes mellitus without complication (HCC) 05/27/2020   HTN (hypertension) 05/27/2020   Acute CVA (cerebrovascular accident) (HCC) 05/27/2020   Genetic testing 03/26/2020   Family history of lung cancer    Family history of lymphoma    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) 02/04/2020   Goals of care, counseling/discussion  02/04/2020   Multiple lung nodules 01/23/2020   BPH with obstruction/lower urinary tract symptoms 11/21/2014   Elevated PSA 11/21/2014    ONSET DATE: date of referral 06/07/2023   REFERRING DIAG: C61 (ICD-10-CM) - Prostate cancer metastatic to multiple sites [facility])   THERAPY DIAG:  Dysphagia, oropharyngeal phase  Rationale for Evaluation and Treatment Rehabilitation  SUBJECTIVE:   SUBJECTIVE STATEMENT: Pt pleasant, had port placed yesterday - not sore today Pt accompanied by: self  PERTINENT HISTORY and DIAGNOSTIC FINDINGS: Brent Glass 83 y.o. 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 Pt also has recent medical history significant for CVA - MRI on 04/07/2023 revealed Small acute or early subacute infarct in the right thalamus.   PAIN:  Are you having pain? No  FALLS: Has patient fallen in last 6 months?  No  LIVING ENVIRONMENT: Lives with: lives alone Lives in: House/apartment  PLOF:  Level of assistance: Independent with ADLs, Independent with IADLs Employment: Retired   PATIENT GOALS  none stated  OBJECTIVE:   COGNITION: Overall cognitive status: Within functional limits for tasks assessed  ORAL MOTOR EXAMINATION Facial : WFL Lingual: WFL Velum: WFL Mandible: WFL Cough: WFL Voice: WFL   CLINICAL SWALLOW ASSESSMENT:   Current diet: regular and thin liquids Dentition: dentures (top), dentures (bottom), and not wearing for today's appt Feeding:  able to feed self Consistencies tested: Thin Liquid: Presentation: Cup and Self-fed Oral Phase: WFL Pharyngeal Phase: WFL Regular: Presentation: By hand and Self-fed Oral Phase: WFL Pharyngeal Phase: WFL   Evaluation findings: Patient presents with oropharyngeal swallow which appears clinically to be within functional limits with adequate airway protection. Oral stage is characterized by appearance of adequate oral containment, mastication, bolus formation, oral transfer  and oral clearance. Swallow initiation appears timely. No overt signs of aspiration observed despite challenging with consecutive straw sips of thin liquids in excess of 3oz.  Aspiration risk factors:History of CVA Overall aspiration risk:No limitations Diet Recommendations: regular and thin liquids Precautions:Minimize environmental distractions, Slow rate, Small sips/bites, Seated upright 90 degrees, and Remain upright for at least 30 minutes after meals Supervision: Patient able to feed self Oral care recommendations:Oral care BID Follow-up recommendations: No treatment recommended     PATIENT REPORTED OUTCOME MEASURES (PROM):  EATING ASSESSMENT TOOL (EAT-10)   The patient was asked to rate to what extent the following statements are problematic on a scale of 0-4. 0 = No problem; 4 = Severe problem. A total score of 3 or higher is considered abnormal.  1.) My swallowing problem has caused me to lose weight. 0 2.) My swallowing problem interferes with my ability to go out for meals. 0 3.) Swallowing liquids takes extra effort. 0 4.) Swallowing solids takes extra effort. 0  5.) Swallowing pills takes extra effort. 0 6.) Swallowing is painful. 0 7.) The pleasure of eating is affected by my swallowing. 0 8.) When I swallow food sticks in my throat. 0 9.) I cough when I eat. 0 10.) Swallowing is stressful. 0   TOTAL SCORE: 0    TODAY'S TREATMENT:  Skilled education provided on aspiration precautions. Pt voiced understanding. Diet recommendation   PATIENT EDUCATION: Education details: results of this assessment Person educated: Patient Education method: Explanation Education comprehension: verbalized understanding  HOME EXERCISE PROGRAM:     Adhere to aspiration precautions    ASSESSMENT:  CLINICAL IMPRESSION: Patient is a 83 y.o. male who was seen today for a clinical swallow evaluation. Pt presents with adequate oropharyngeal abilities with consuming thin liquids and  regular diet texture snack. He also denies any overt s/s of aspiration/dysphagia when consuming POs at home. He does however report decreased PO intake d/t "I am just to lazy to get up and get me something."    PLAN:  At this time, pt doesn't have a need for skilled ST services. Please reconsult if pt condition changes.   Darin Redmann B. Garlin Junker, M.S., CCC-SLP, Tree surgeon Certified Brain Injury Specialist [facility]  [facility] Rehabilitation Services Office [phone] Ascom [phone] Fax [phone]

## 2023-06-24 ENCOUNTER — Encounter: Payer: Self-pay | Admitting: Internal Medicine

## 2023-06-28 ENCOUNTER — Inpatient Hospital Stay

## 2023-06-28 ENCOUNTER — Inpatient Hospital Stay (HOSPITAL_BASED_OUTPATIENT_CLINIC_OR_DEPARTMENT_OTHER): Admitting: Internal Medicine

## 2023-06-28 ENCOUNTER — Encounter: Payer: Self-pay | Admitting: Internal Medicine

## 2023-06-28 VITALS — BP 133/67 | HR 73 | Temp 97.2°F | Resp 20 | Wt 212.2 lb

## 2023-06-28 VITALS — BP 158/71 | HR 64 | Temp 96.8°F | Resp 18

## 2023-06-28 DIAGNOSIS — C61 Malignant neoplasm of prostate: Secondary | ICD-10-CM

## 2023-06-28 DIAGNOSIS — Z5111 Encounter for antineoplastic chemotherapy: Secondary | ICD-10-CM | POA: Diagnosis not present

## 2023-06-28 LAB — CMP (CANCER CENTER ONLY)
ALT: 6 U/L (ref 0–44)
AST: 12 U/L — ABNORMAL LOW (ref 15–41)
Albumin: 3.5 g/dL (ref 3.5–5.0)
Alkaline Phosphatase: 109 U/L (ref 38–126)
Anion gap: 10 (ref 5–15)
BUN: 19 mg/dL (ref 8–23)
CO2: 23 mmol/L (ref 22–32)
Calcium: 9.2 mg/dL (ref 8.9–10.3)
Chloride: 104 mmol/L (ref 98–111)
Creatinine: 1.23 mg/dL (ref 0.61–1.24)
GFR, Estimated: 58 mL/min — ABNORMAL LOW (ref >60)
Glucose, Bld: 148 mg/dL — ABNORMAL HIGH (ref 70–99)
Potassium: 3.9 mmol/L (ref 3.5–5.1)
Sodium: 137 mmol/L (ref 135–145)
Total Bilirubin: 0.6 mg/dL (ref 0.0–1.2)
Total Protein: 6.8 g/dL (ref 6.5–8.1)

## 2023-06-28 LAB — CBC WITH DIFFERENTIAL (CANCER CENTER ONLY)
Abs Immature Granulocytes: 0.04 10*3/uL (ref 0.00–0.07)
Basophils Absolute: 0.1 10*3/uL (ref 0.0–0.1)
Basophils Relative: 1 %
Eosinophils Absolute: 0.3 10*3/uL (ref 0.0–0.5)
Eosinophils Relative: 3 %
HCT: 36.2 % — ABNORMAL LOW (ref 39.0–52.0)
Hemoglobin: 11.5 g/dL — ABNORMAL LOW (ref 13.0–17.0)
Immature Granulocytes: 0 %
Lymphocytes Relative: 35 %
Lymphs Abs: 3.5 10*3/uL (ref 0.7–4.0)
MCH: 27.2 pg (ref 26.0–34.0)
MCHC: 31.8 g/dL (ref 30.0–36.0)
MCV: 85.6 fL (ref 80.0–100.0)
Monocytes Absolute: 0.7 10*3/uL (ref 0.1–1.0)
Monocytes Relative: 7 %
Neutro Abs: 5.5 10*3/uL (ref 1.7–7.7)
Neutrophils Relative %: 54 %
Platelet Count: 365 10*3/uL (ref 150–400)
RBC: 4.23 MIL/uL (ref 4.22–5.81)
RDW: 15.2 % (ref 11.5–15.5)
WBC Count: 10.1 10*3/uL (ref 4.0–10.5)
nRBC: 0 % (ref 0.0–0.2)

## 2023-06-28 LAB — PSA: Prostatic Specific Antigen: 4.02 ng/mL — ABNORMAL HIGH (ref 0.00–4.00)

## 2023-06-28 MED ORDER — DEXAMETHASONE SODIUM PHOSPHATE 10 MG/ML IJ SOLN
10.0000 mg | Freq: Once | INTRAMUSCULAR | Status: AC
  Administered 2023-06-28: 10 mg via INTRAVENOUS
  Filled 2023-06-28: qty 1

## 2023-06-28 MED ORDER — SODIUM CHLORIDE 0.9 % IV SOLN
60.0000 mg/m2 | Freq: Once | INTRAVENOUS | Status: AC
  Administered 2023-06-28: 134 mg via INTRAVENOUS
  Filled 2023-06-28: qty 13.4

## 2023-06-28 MED ORDER — SODIUM CHLORIDE 0.9 % IV SOLN
INTRAVENOUS | Status: DC
Start: 2023-06-28 — End: 2023-06-28
  Filled 2023-06-28: qty 250

## 2023-06-28 NOTE — Progress Notes (Signed)
 Pt has a good appetite. Denies fatigue. Breathing fine. Denies any pain or pelvic discomfort

## 2023-06-28 NOTE — Patient Instructions (Signed)
 [facility] - A DEPT OF [facility]  Discharge Instructions: Thank you for choosing [facility] to provide your oncology and hematology care.  If you have a lab appointment with the Cancer Center, please go directly to the Cancer Center and check in at the registration area.  Wear comfortable clothing and clothing appropriate for easy access to any Portacath or PICC line.   We strive to give you quality time with your provider. You may need to reschedule your appointment if you arrive late (15 or more minutes).  Arriving late affects you and other patients whose appointments are after yours.  Also, if you miss three or more appointments without notifying the office, you may be dismissed from the clinic at the provider's discretion.      For prescription refill requests, have your pharmacy contact our office and allow 72 hours for refills to be completed.    Today you received the following chemotherapy and/or immunotherapy agents Taxotere       To help prevent nausea and vomiting after your treatment, we encourage you to take your nausea medication as directed.  BELOW ARE SYMPTOMS THAT SHOULD BE REPORTED IMMEDIATELY: *FEVER GREATER THAN 100.4 F (38 C) OR HIGHER *CHILLS OR SWEATING *NAUSEA AND VOMITING THAT IS NOT CONTROLLED WITH YOUR NAUSEA MEDICATION *UNUSUAL SHORTNESS OF BREATH *UNUSUAL BRUISING OR BLEEDING *URINARY PROBLEMS (pain or burning when urinating, or frequent urination) *BOWEL PROBLEMS (unusual diarrhea, constipation, pain near the anus) TENDERNESS IN MOUTH AND THROAT WITH OR WITHOUT PRESENCE OF ULCERS (sore throat, sores in mouth, or a toothache) UNUSUAL RASH, SWELLING OR PAIN  UNUSUAL VAGINAL DISCHARGE OR ITCHING   Items with * indicate a potential emergency and should be followed up as soon as possible or go to the Emergency Department if any problems should occur.  Please show the CHEMOTHERAPY ALERT CARD or IMMUNOTHERAPY  ALERT CARD at check-in to the Emergency Department and triage nurse.  Should you have questions after your visit or need to cancel or reschedule your appointment, please contact [facility] - A DEPT OF [facility]  [phone] and follow the prompts.  Office hours are 8:00 a.m. to 4:30 p.m. Monday - Friday. Please note that voicemails left after 4:00 p.m. may not be returned until the following business day.  We are closed weekends and major holidays. You have access to a nurse at all times for urgent questions. Please call the main number to the clinic [phone] and follow the prompts.  For any non-urgent questions, you may also contact your provider using MyChart. We now offer e-Visits for anyone 59 and older to request care online for non-urgent symptoms. For details visit [email or web address].   Also download the MyChart app! Go to the app store, search "MyChart", open the app, select [facility], and log in with your MyChart username and password.

## 2023-06-28 NOTE — Assessment & Plan Note (Addendum)
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 NGS-F-ONE 2022- NEGATIVE for any targets.   # MARCH 2025- PET scan shows progression-although Lesions are subcentimeter in size; also PSA is rising.  # Given castrate resistant prostate cancer-recommend Taxotere  chemotherapy-every 3 weeks x 6 cycles.  Patient will get growth factor on day 2.  Will dose reduce to 60 mg/m. #  I reviewed at length the individual components with chemotherapy; and the schedule in detail. I also discussed the potential side effects including but not limited to-increasing fatigue, nausea vomiting, diarrhea, hair loss, sores in the mouth, increase risk of infection and also neuropathy.  Understands treatments are palliative not curative.   # Hot flashes- sec to ADT- monitor for now.   # RUL nodule- ~2-3cm; cystic [on PET AUG 2022]-  No new dominant mass, fluid collection or lymph node enlargement in the thorax. Stable- on most recent PET scan in OCT 2024.  Will monitor on chemotherapy.    # Chest tenderness-grade 1-2. Sec to ADT- continue tramadol  prn-refiled; add vaseline BID- stable.   # DM-on metformin-oral hypoglycemic drugs- monitor closley on chemo/steroids.    # IV access:s/p  Mediport placement.  # ACP: FULL code.   * eliagrd 45 11/12//2024- due on mid MAY 2025; PSA- poor surrogate.   PS # DISPOSITION:   # chemo today; shot tomorrow # Follow up in 3   weeks-- MD-;labs- cbc/cmp/PSA; Taxoetere chemo; D-2 injection--Dr.B

## 2023-06-28 NOTE — Progress Notes (Signed)
 Nutrition Follow-up:  Patient with prostate cancer.  Patient receiving docetaxel  and prednisone   Met with patient during infusion.  Reports that his appetite is good.  Denies nutrition impact symptoms at this time.      Medications: reviewed  Labs: reviewed  Anthropometrics:   Weight 212 lb   215 lb on 4/1 UBW of 210-216 lb   NUTRITION DIAGNOSIS: none at this time    INTERVENTION:  Continue well balanced diet including foods rich in protein    MONITORING, EVALUATION, GOAL: weight trends, intake   NEXT VISIT: as needed  Brent Glass, CSO, LDN Registered Dietitian [phone]

## 2023-06-28 NOTE — Progress Notes (Signed)
 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   # NOV 2021- CTA [ER] Diffuse bilateral pulmonary nodules including a cavitary lesion in the right upper lobe measuring approximately 2 cm. There is extensive nodular pleural thickening bilaterally, greatest in the left lower lobe.  November 2021 PET scan-multiple lung lesions multiple bone lesions and prostate uptake.  February 2022-prostate biopsy [Dr.Brandon]   # DM-2; NO COPD [?]; ACTIVE SMOKER 65ppd.  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 No pathogenic variants identified on the [facility] Multi-Cancer Panel. VUS in AXIN2 called c.1235A>C identified. The report date is 03/23/2020.   The Multi-Cancer Panel offered by [facility]  includes sequencing and/or deletion duplication testing of the following 84 genes: AIP, ALK, APC, ATM, AXIN2,BAP1,  BARD1, BLM, BMPR1A, BRCA1, BRCA2, BRIP1, CASR, CDC73, CDH1, CDK4, CDKN1B, CDKN1C, CDKN2A (p14ARF), CDKN2A (p16INK4a), CEBPA, CHEK2, CTNNA1, DICER1, DIS3L2, EGFR (c.2369C>T, p.Thr790Met variant only), EPCAM (Deletion/duplication testing only), FH, FLCN, GATA2, GPC3, GREM1 (Promoter region deletion/duplication testing only), HOXB13 (c.251G>A, p.Gly84Glu), HRAS, KIT, MAX, MEN1, MET, MITF (c.952G>A, p.Glu318Lys variant only), MLH1, MSH2, MSH3, MSH6, MUTYH, NBN, NF1, NF2, NTHL1, PALB2, PDGFRA, PHOX2B, PMS2, POLD1, POLE, POT1, PRKAR1A, PTCH1, PTEN, RAD50, RAD51C, RAD51D, RB1, RECQL4, RET, RUNX1, SDHAF2, SDHA (sequence changes only), SDHB, SDHC, SDHD, SMAD4, SMARCA4, SMARCB1, SMARCE1, STK11, SUFU, TERC, TERT, TMEM127, TP53, TSC1, TSC2, VHL, WRN and WT1.    06/28/2023 -  Chemotherapy   Patient is on Treatment Plan : PROSTATE Docetaxel  (75) + Prednisone  q21d       HISTORY OF PRESENTING ILLNESS: Ambulating independently. Alone,   Brent Glass 83 y.o.  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  Patient interim underwent a port placement.  No falls.  No fever no chills.  No constipation no diarrhea.  No nausea no vomiting. Denies any worsening pain.   Review of Systems  Constitutional:  Positive for malaise/fatigue. Negative for chills, diaphoresis and fever.  HENT:  Negative for nosebleeds and sore throat.   Eyes:  Negative for double vision.  Respiratory:  Negative for hemoptysis, sputum production, shortness of breath and wheezing.   Cardiovascular:  Negative for chest pain, palpitations, orthopnea and leg swelling.  Gastrointestinal:  Negative for blood in stool, constipation, diarrhea,  heartburn, melena, nausea and vomiting.  Genitourinary:  Negative for dysuria, frequency and urgency.  Musculoskeletal:   Positive for back pain and myalgias. Negative for joint pain.  Skin: Negative.  Negative for itching and rash.  Neurological:  Negative for dizziness, tingling, focal weakness, weakness and headaches.  Endo/Heme/Allergies:  Does not bruise/bleed easily.  Psychiatric/Behavioral:  Negative for depression. The patient is not nervous/anxious and does not have insomnia.      MEDICAL HISTORY:  Past Medical History:  Diagnosis Date   Arthritis    BPH (benign prostatic hyperplasia)    BPH with obstruction/lower urinary tract symptoms    Diabetes (HCC)    Disorder resulting from impaired renal function    Elevated PSA    Family history of lung cancer    Family history of lymphoma    Heart murmur    Hypertension    Knee pain    Lesion of lung    Low HDL (under 40)    Obesity    Urinary hesitancy     SURGICAL HISTORY: Past Surgical History:  Procedure Laterality Date   HERNIA REPAIR  2012   Umbilical Hernia Repair   IR IMAGING GUIDED PORT INSERTION  06/21/2023   KNEE ARTHROSCOPY Left 04/10/2015   Procedure: ARTHROSCOPY KNEE, partial medial meniscectomy, partial synovectomy;  Surgeon: Molli Angelucci, MD;  Location: [facility] ORS;  Service: Orthopedics;  Laterality: Left;    SOCIAL HISTORY: Social History   Socioeconomic History   Marital status: Widowed    Spouse name: Not on file   Number of children: Not on file   Years of education: Not on file   Highest education level: Not on file  Occupational History   Not on file  Tobacco Use   Smoking status: Every Day    Current packs/day: 1.00    Types: Cigarettes   Smokeless tobacco: Never  Vaping Use   Vaping status: Never Used  Substance and Sexual Activity   Alcohol use: No    Alcohol/week: 0.0 standard drinks of alcohol   Drug use: No   Sexual activity: Not Currently  Other Topics Concern   Not on file  Social History Narrative   Lives in [place]; self; daughter lives 1 mile. Smoke 1ppd > 65 years; stopped 25 years ago.   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    Social Drivers of Corporate investment banker Strain: Not on file  Food Insecurity: No Food Insecurity (04/08/2023)   Hunger Vital Sign    Worried About Running Out of Food in the Last Year: Never true    Ran Out of Food in the Last Year: Never true  Transportation Needs: No Transportation Needs (04/08/2023)   PRAPARE - Administrator, Civil Service (Medical): No    Lack of Transportation (Non-Medical): No  Physical Activity: Not on file  Stress: Not on file  Social Connections: Moderately Integrated (04/08/2023)   Social Connection and Isolation Panel [NHANES]    Frequency of Communication with Friends and Family: More than three times a week    Frequency of Social Gatherings with Friends and Family: More than three times a week    Attends Religious Services: 1 to 4 times per year    Active Member of [employer] or Organizations: Yes    Attends Banker Meetings: 1 to 4 times per year    Marital Status: Widowed  Intimate Partner Violence: Not At Risk (04/08/2023)   Humiliation, Afraid, Rape, and Kick questionnaire  Fear of Current or Ex-Partner: No    Emotionally Abused: No    Physically Abused: No    Sexually Abused: No    FAMILY HISTORY: Family History  Problem Relation Age of Onset   Diabetes Mother    Lung cancer Mother    Lymphoma Son 33       died in 75.    Kidney disease Neg Hx    Prostate cancer Neg Hx     ALLERGIES:  has no known allergies.  MEDICATIONS:  Current Outpatient Medications  Medication Sig Dispense Refill   acetaminophen  (TYLENOL ) 650 MG CR tablet Take 650 mg by mouth every 8 (eight) hours as needed for pain.     clopidogrel  (PLAVIX ) 75 MG tablet Take 1 tablet (75 mg total) by mouth daily. 21 tablet 0   cyanocobalamin  2000 MCG tablet Take 1 tablet (2,000 mcg total) by mouth daily. 90 tablet 1   enzalutamide  (XTANDI ) 40 MG tablet Take 3 tablets (120 mg total) by  mouth daily. 90 tablet 3   finasteride  (PROSCAR ) 5 MG tablet TAKE 1 TABLET BY MOUTH EVERY DAY 90 tablet 3   furosemide  (LASIX ) 20 MG tablet Take 1 tablet (20 mg total) by mouth daily. 15 tablet 0   GLUMETZA 1000 MG 24 hr tablet Take 1,000 mg by mouth 2 (two) times daily.     lidocaine -prilocaine  (EMLA ) cream Apply 1 Application topically as needed. Apply to port and cover with saran wrap 1-2 hours prior to port access 30 g 1   metFORMIN (GLUCOPHAGE) 1000 MG tablet Take 1,000 mg by mouth 2 (two) times daily with a meal.     rosuvastatin  (CRESTOR ) 20 MG tablet Take 2 tablets (40 mg total) by mouth at bedtime. 60 tablet 6   telmisartan (MICARDIS) 20 MG tablet Take 1 tablet by mouth daily.     thiamine  100 MG tablet TAKE 1 TABLET BY MOUTH EVERY DAY 90 tablet 1   ondansetron  (ZOFRAN ) 8 MG tablet Take 1 tablet (8 mg total) by mouth every 8 (eight) hours as needed for nausea or vomiting. (Patient not taking: Reported on 06/28/2023) 20 tablet 0   prochlorperazine  (COMPAZINE ) 10 MG tablet Take 1 tablet (10 mg total) by mouth every 6 (six) hours as needed for nausea or vomiting. (Patient not taking: Reported on 06/28/2023) 30 tablet 2   traMADol  (ULTRAM ) 50 MG tablet Take 1 tablet (50 mg total) by mouth daily as needed. (Patient not taking: Reported on 06/28/2023) 60 tablet 0   No current facility-administered medications for this visit.   Facility-Administered Medications Ordered in Other Visits  Medication Dose Route Frequency Provider Last Rate Last Admin   0.9 %  sodium chloride  infusion   Intravenous Continuous [employer] [facility] R, MD 10 mL/hr at 06/28/23 0939 New Bag at 06/28/23 0939   DOCEtaxel  (TAXOTERE ) 134 mg in sodium chloride  0.9 % 250 mL chemo infusion  60 mg/m2 (Treatment Plan Recorded) Intravenous Once Jeronimo Hellberg R, MD          .  PHYSICAL EXAMINATION: ECOG PERFORMANCE STATUS: 0 - Asymptomatic  Vitals:   06/28/23 0845  BP: 133/67  Pulse: 73  Resp: 20  Temp: (!) 97.2 F  (36.2 C)  SpO2: 99%        Filed Weights   06/28/23 0845  Weight: 212 lb 3.2 oz (96.3 kg)        Physical Exam HENT:     Head: Normocephalic and atraumatic.     Mouth/Throat:  Pharynx: No oropharyngeal exudate.  Eyes:     Pupils: Pupils are equal, round, and reactive to light.  Cardiovascular:     Rate and Rhythm: Normal rate and regular rhythm.  Pulmonary:     Effort: No respiratory distress.     Breath sounds: No wheezing.     Comments: Decreased breath sounds bilaterally. Abdominal:     General: Bowel sounds are normal. There is no distension.     Palpations: Abdomen is soft. There is no mass.     Tenderness: There is no abdominal tenderness. There is no guarding or rebound.  Musculoskeletal:        General: No tenderness. Normal range of motion.     Cervical back: Normal range of motion and neck supple.  Skin:    General: Skin is warm.  Neurological:     Mental Status: He is alert and oriented to person, place, and time.  Psychiatric:        Mood and Affect: Affect normal.      LABORATORY DATA:  I have reviewed the data as listed Lab Results  Component Value Date   WBC 10.1 06/28/2023   HGB 11.5 (L) 06/28/2023   HCT 36.2 (L) 06/28/2023   MCV 85.6 06/28/2023   PLT 365 06/28/2023   Recent Labs    04/07/23 1605 05/20/23 1502 06/28/23 0829  NA 140 134* 137  K 4.0 3.7 3.9  CL 107 102 104  CO2 23 23 23   GLUCOSE 106* 148* 148*  BUN 16 18 19   CREATININE 0.98 1.21 1.23  CALCIUM  9.3 9.2 9.2  GFRNONAA >60 59* 58*  PROT 7.0 7.2 6.8  ALBUMIN 3.6 3.4* 3.5  AST 10* 11* 12*  ALT 7 10 6   ALKPHOS 92 121 109  BILITOT 0.6 0.5 0.6    Latest Reference Range & Units 01/23/20 12:22 03/10/20 14:11 03/19/20 09:19 04/16/20 09:10 05/14/20 10:06 06/13/20 09:26 07/11/20 10:13 08/07/20 09:24 09/04/20 10:13 09/16/20 13:59 10/21/20 13:59 12/23/20 13:27  Prostatic Specific Antigen 0.00 - 4.00 ng/mL 986.00 (H) 34.69 (H) 21.59 (H) 2.68 1.21 0.71 0.68 0.41 0.36 0.28  0.29 0.28  (H): Data is abnormally high  RADIOGRAPHIC STUDIES: I have personally reviewed the radiological images as listed and agreed with the findings in the report. IR IMAGING GUIDED PORT INSERTION Result Date: 06/21/2023 CLINICAL DATA:  Metastatic prostate carcinoma and need for porta cath for chemotherapy. EXAM: IMPLANTED PORT A CATH PLACEMENT WITH ULTRASOUND AND FLUOROSCOPIC GUIDANCE ANESTHESIA/SEDATION: Moderate (conscious) sedation was employed during this procedure. A total of Versed  1.5 mg and Fentanyl  50 mcg was administered intravenously. Moderate Sedation Time: 30 minutes. The patient's level of consciousness and vital signs were monitored continuously by radiology nursing throughout the procedure under my direct supervision. FLUOROSCOPY: 62 seconds.  6.3 mGy. PROCEDURE: The procedure, risks, benefits, and alternatives were explained to the patient. Questions regarding the procedure were encouraged and answered. The patient understands and consents to the procedure. A time-out was performed prior to initiating the procedure. Ultrasound was utilized to confirm patency of the right internal jugular vein. An ultrasound image was saved and recorded. The right neck and chest were prepped with chlorhexidine  in a sterile fashion, and a sterile drape was applied covering the operative field. Maximum barrier sterile technique with sterile gowns and gloves were used for the procedure. Local anesthesia was provided with 1% lidocaine . After creating a small venotomy incision, a 21 gauge needle was advanced into the right internal jugular vein under direct, real-time ultrasound guidance. Ultrasound  image documentation was performed. After securing guidewire access, an 8 Fr dilator was placed. A J-wire was kinked to measure appropriate catheter length. A subcutaneous port pocket was then created along the upper chest wall utilizing sharp and blunt dissection. Portable cautery was utilized. The pocket was  irrigated with sterile saline. A single lumen power injectable port was chosen for placement. The 8 Fr catheter was tunneled from the port pocket site to the venotomy incision. The port was placed in the pocket. External catheter was trimmed to appropriate length based on guidewire measurement. At the venotomy, an 8 Fr peel-away sheath was placed over a guidewire. The catheter was then placed through the sheath and the sheath removed. Final catheter positioning was confirmed and documented with a fluoroscopic spot image. The port was accessed with a needle and aspirated and flushed with heparinized saline. The access needle was removed. The venotomy and port pocket incisions were closed with subcutaneous 3-0 Monocryl and subcuticular 4-0 Vicryl. Dermabond was applied to both incisions. COMPLICATIONS: COMPLICATIONS None FINDINGS: After catheter placement, the tip lies at the cavo-atrial junction. The catheter aspirates normally and is ready for immediate use. IMPRESSION: Placement of single lumen port a cath via right internal jugular vein. The catheter tip lies at the cavo-atrial junction. A power injectable port a cath was placed and is ready for immediate use. Electronically Signed   By: Erica Hau M.D.   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 NGS-F-ONE 2022- NEGATIVE for any targets.   # MARCH 2025- PET scan shows progression-although Lesions are subcentimeter in size; also PSA is rising.  # Given castrate resistant prostate cancer-recommend Taxotere  chemotherapy-every 3 weeks x 6 cycles.  Patient will get growth factor on day 2.  Will dose reduce to 60 mg/m. #  I reviewed at length the individual components with chemotherapy; and the schedule in detail. I also discussed the potential side effects including but not limited to-increasing  fatigue, nausea vomiting, diarrhea, hair loss, sores in the mouth, increase risk of infection and also neuropathy.  Understands treatments are palliative not curative.   # Hot flashes- sec to ADT- monitor for now.   # RUL nodule- ~2-3cm; cystic [on PET AUG 2022]-  No new dominant mass, fluid collection or lymph node enlargement in the thorax. Stable- on most recent PET scan in OCT 2024.  Will monitor on chemotherapy.    # Chest tenderness-grade 1-2. Sec to ADT- continue tramadol  prn-refiled; add vaseline BID- stable.   # DM-on metformin-oral hypoglycemic drugs- monitor closley on chemo/steroids.    # IV access:s/p  Mediport placement.  # ACP: FULL code.   * eliagrd 45 11/12//2024- due on mid MAY 2025; PSA- poor surrogate.   PS # DISPOSITION:   # chemo today; shot tomorrow # Follow up in 3   weeks-- MD-;labs- cbc/cmp/PSA; Taxoetere chemo; D-2 injection--Dr.B  All questions were answered. The patient knows to call the clinic with any problems, questions or concerns.    Gwyn Leos, MD 06/28/2023 10:03 AM

## 2023-06-29 ENCOUNTER — Inpatient Hospital Stay

## 2023-06-29 ENCOUNTER — Telehealth: Payer: Self-pay

## 2023-06-29 DIAGNOSIS — C61 Malignant neoplasm of prostate: Secondary | ICD-10-CM

## 2023-06-29 DIAGNOSIS — Z5111 Encounter for antineoplastic chemotherapy: Secondary | ICD-10-CM | POA: Diagnosis not present

## 2023-06-29 MED ORDER — PEGFILGRASTIM-CBQV 6 MG/0.6ML ~~LOC~~ SOSY
6.0000 mg | PREFILLED_SYRINGE | Freq: Once | SUBCUTANEOUS | Status: AC
  Administered 2023-06-29: 6 mg via SUBCUTANEOUS
  Filled 2023-06-29: qty 0.6

## 2023-06-29 NOTE — Telephone Encounter (Signed)
 Telephone call made for follow up after receiving first chemo yesterday.  No answer and mail box is full so unable to leave message.

## 2023-06-30 ENCOUNTER — Other Ambulatory Visit: Payer: Self-pay

## 2023-07-05 ENCOUNTER — Other Ambulatory Visit (HOSPITAL_COMMUNITY): Payer: Self-pay

## 2023-07-12 ENCOUNTER — Other Ambulatory Visit: Payer: Self-pay

## 2023-07-13 ENCOUNTER — Encounter: Payer: Self-pay | Admitting: Internal Medicine

## 2023-07-14 ENCOUNTER — Other Ambulatory Visit (HOSPITAL_COMMUNITY): Payer: Self-pay

## 2023-07-14 ENCOUNTER — Other Ambulatory Visit: Payer: Self-pay

## 2023-07-14 NOTE — Progress Notes (Signed)
 Per OV on 4/22, Xtandi  has been discontinued due to progression. Disenrolling patient from [facility].

## 2023-07-19 ENCOUNTER — Inpatient Hospital Stay

## 2023-07-19 ENCOUNTER — Encounter: Payer: Self-pay | Admitting: Internal Medicine

## 2023-07-19 ENCOUNTER — Inpatient Hospital Stay: Attending: Internal Medicine

## 2023-07-19 ENCOUNTER — Inpatient Hospital Stay (HOSPITAL_BASED_OUTPATIENT_CLINIC_OR_DEPARTMENT_OTHER): Admitting: Internal Medicine

## 2023-07-19 VITALS — BP 134/60 | HR 78 | Temp 97.3°F | Resp 18 | Wt 209.0 lb

## 2023-07-19 DIAGNOSIS — C61 Malignant neoplasm of prostate: Secondary | ICD-10-CM

## 2023-07-19 DIAGNOSIS — E119 Type 2 diabetes mellitus without complications: Secondary | ICD-10-CM | POA: Insufficient documentation

## 2023-07-19 DIAGNOSIS — Z7984 Long term (current) use of oral hypoglycemic drugs: Secondary | ICD-10-CM | POA: Diagnosis not present

## 2023-07-19 DIAGNOSIS — C7800 Secondary malignant neoplasm of unspecified lung: Secondary | ICD-10-CM | POA: Insufficient documentation

## 2023-07-19 DIAGNOSIS — Z5189 Encounter for other specified aftercare: Secondary | ICD-10-CM | POA: Diagnosis not present

## 2023-07-19 DIAGNOSIS — Z5111 Encounter for antineoplastic chemotherapy: Secondary | ICD-10-CM | POA: Diagnosis present

## 2023-07-19 DIAGNOSIS — Z79899 Other long term (current) drug therapy: Secondary | ICD-10-CM | POA: Diagnosis not present

## 2023-07-19 DIAGNOSIS — F17210 Nicotine dependence, cigarettes, uncomplicated: Secondary | ICD-10-CM | POA: Insufficient documentation

## 2023-07-19 DIAGNOSIS — C7951 Secondary malignant neoplasm of bone: Secondary | ICD-10-CM | POA: Diagnosis present

## 2023-07-19 LAB — CBC WITH DIFFERENTIAL (CANCER CENTER ONLY)
Abs Immature Granulocytes: 0.06 10*3/uL (ref 0.00–0.07)
Basophils Absolute: 0.1 10*3/uL (ref 0.0–0.1)
Basophils Relative: 1 %
Eosinophils Absolute: 0.2 10*3/uL (ref 0.0–0.5)
Eosinophils Relative: 2 %
HCT: 32.7 % — ABNORMAL LOW (ref 39.0–52.0)
Hemoglobin: 10.3 g/dL — ABNORMAL LOW (ref 13.0–17.0)
Immature Granulocytes: 1 %
Lymphocytes Relative: 28 %
Lymphs Abs: 3.5 10*3/uL (ref 0.7–4.0)
MCH: 26.7 pg (ref 26.0–34.0)
MCHC: 31.5 g/dL (ref 30.0–36.0)
MCV: 84.7 fL (ref 80.0–100.0)
Monocytes Absolute: 1 10*3/uL (ref 0.1–1.0)
Monocytes Relative: 8 %
Neutro Abs: 7.4 10*3/uL (ref 1.7–7.7)
Neutrophils Relative %: 60 %
Platelet Count: 642 10*3/uL — ABNORMAL HIGH (ref 150–400)
RBC: 3.86 MIL/uL — ABNORMAL LOW (ref 4.22–5.81)
RDW: 15.8 % — ABNORMAL HIGH (ref 11.5–15.5)
WBC Count: 12.3 10*3/uL — ABNORMAL HIGH (ref 4.0–10.5)
nRBC: 0 % (ref 0.0–0.2)

## 2023-07-19 LAB — CMP (CANCER CENTER ONLY)
ALT: 7 U/L (ref 0–44)
AST: 14 U/L — ABNORMAL LOW (ref 15–41)
Albumin: 3.1 g/dL — ABNORMAL LOW (ref 3.5–5.0)
Alkaline Phosphatase: 108 U/L (ref 38–126)
Anion gap: 9 (ref 5–15)
BUN: 15 mg/dL (ref 8–23)
CO2: 24 mmol/L (ref 22–32)
Calcium: 9.1 mg/dL (ref 8.9–10.3)
Chloride: 105 mmol/L (ref 98–111)
Creatinine: 1.07 mg/dL (ref 0.61–1.24)
GFR, Estimated: >60 mL/min (ref >60)
Glucose, Bld: 170 mg/dL — ABNORMAL HIGH (ref 70–99)
Potassium: 3.9 mmol/L (ref 3.5–5.1)
Sodium: 138 mmol/L (ref 135–145)
Total Bilirubin: 0.3 mg/dL (ref 0.0–1.2)
Total Protein: 6.8 g/dL (ref 6.5–8.1)

## 2023-07-19 LAB — PSA: Prostatic Specific Antigen: 4.17 ng/mL — ABNORMAL HIGH (ref 0.00–4.00)

## 2023-07-19 MED ORDER — FREESTYLE LANCETS MISC: 12 refills | Status: AC

## 2023-07-19 MED ORDER — SODIUM CHLORIDE 0.9 % IV SOLN
60.0000 mg/m2 | Freq: Once | INTRAVENOUS | Status: AC
  Administered 2023-07-19: 134 mg via INTRAVENOUS
  Filled 2023-07-19: qty 13.4

## 2023-07-19 MED ORDER — LANCET DEVICE MISC: 1.0000 | Freq: Three times a day (TID) | 3 refills | Status: AC

## 2023-07-19 MED ORDER — HEPARIN SOD (PORK) LOCK FLUSH 100 UNIT/ML IV SOLN
500.0000 [IU] | Freq: Once | INTRAVENOUS | Status: AC | PRN
  Administered 2023-07-19: 500 [IU]
  Filled 2023-07-19: qty 5

## 2023-07-19 MED ORDER — SODIUM CHLORIDE 0.9 % IV SOLN
INTRAVENOUS | Status: DC
  Filled 2023-07-19: qty 250

## 2023-07-19 MED ORDER — DEXAMETHASONE SODIUM PHOSPHATE 10 MG/ML IJ SOLN
10.0000 mg | Freq: Once | INTRAMUSCULAR | Status: AC
  Administered 2023-07-19: 10 mg via INTRAVENOUS

## 2023-07-19 MED ORDER — BLOOD GLUCOSE TEST VI STRP: 1.0000 | ORAL_STRIP | Freq: Three times a day (TID) | 3 refills | Status: AC

## 2023-07-19 MED ORDER — BLOOD GLUCOSE MONITORING SUPPL DEVI: 1.0000 | Freq: Three times a day (TID) | 0 refills | Status: AC

## 2023-07-19 NOTE — Progress Notes (Signed)
 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   # NOV 2021- CTA [ER] Diffuse bilateral pulmonary nodules including a cavitary lesion in the right upper lobe measuring approximately 2 cm. There is extensive nodular pleural thickening bilaterally, greatest in the left lower lobe.  November 2021 PET scan-multiple lung lesions multiple bone lesions and prostate uptake.  February 2022-prostate biopsy [Dr.Brandon]   # DM-2; NO COPD [?]; ACTIVE SMOKER 65ppd.  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 No pathogenic variants identified on the [facility] Multi-Cancer Panel. VUS in AXIN2 called c.1235A>C identified. The report date is 03/23/2020.   The Multi-Cancer Panel offered by [facility]  includes sequencing and/or deletion duplication testing of the following 84 genes: AIP, ALK, APC, ATM, AXIN2,BAP1,  BARD1, BLM, BMPR1A, BRCA1, BRCA2, BRIP1, CASR, CDC73, CDH1, CDK4, CDKN1B, CDKN1C, CDKN2A (p14ARF), CDKN2A (p16INK4a), CEBPA, CHEK2, CTNNA1, DICER1, DIS3L2, EGFR (c.2369C>T, p.Thr790Met variant only), EPCAM (Deletion/duplication testing only), FH, FLCN, GATA2, GPC3, GREM1 (Promoter region deletion/duplication testing only), HOXB13 (c.251G>A, p.Gly84Glu), HRAS, KIT, MAX, MEN1, MET, MITF (c.952G>A, p.Glu318Lys variant only), MLH1, MSH2, MSH3, MSH6, MUTYH, NBN, NF1, NF2, NTHL1, PALB2, PDGFRA, PHOX2B, PMS2, POLD1, POLE, POT1, PRKAR1A, PTCH1, PTEN, RAD50, RAD51C, RAD51D, RB1, RECQL4, RET, RUNX1, SDHAF2, SDHA (sequence changes only), SDHB, SDHC, SDHD, SMAD4, SMARCA4, SMARCB1, SMARCE1, STK11, SUFU, TERC, TERT, TMEM127, TP53, TSC1, TSC2, VHL, WRN and WT1.    06/28/2023 -  Chemotherapy   Patient is on Treatment Plan : PROSTATE Docetaxel  (75) + Prednisone  q21d       HISTORY OF PRESENTING ILLNESS: Ambulating independently. Alone,   Brent Glass 83 y.o.  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  Patient is doing well, no new questions or concerns for the doctor today. But has lost weight. Positive for hair loss.   No falls.  No fever no chills.  No constipation no diarrhea.  No nausea no vomiting. Denies any worsening pain. No tingling or numbness.   Review of Systems  Constitutional:  Positive for malaise/fatigue. Negative for chills, diaphoresis and fever.  HENT:  Negative for nosebleeds and sore throat.   Eyes:  Negative for double vision.  Respiratory:  Negative for hemoptysis, sputum production, shortness of breath and wheezing.  Cardiovascular:  Negative for chest pain, palpitations, orthopnea and leg swelling.  Gastrointestinal:  Negative for blood in stool, constipation, diarrhea,  heartburn, melena, nausea and vomiting.  Genitourinary:  Negative for dysuria, frequency and urgency.  Musculoskeletal:  Positive for back pain and myalgias. Negative for joint pain.  Skin: Negative.  Negative for itching and rash.  Neurological:  Negative for dizziness, tingling, focal weakness, weakness and headaches.  Endo/Heme/Allergies:  Does not bruise/bleed easily.  Psychiatric/Behavioral:  Negative for depression. The patient is not nervous/anxious and does not have insomnia.      MEDICAL HISTORY:  Past Medical History:  Diagnosis Date   Arthritis    BPH (benign prostatic hyperplasia)    BPH with obstruction/lower urinary tract symptoms    Diabetes (HCC)    Disorder resulting from impaired renal function    Elevated PSA    Family history of lung cancer    Family history of lymphoma    Heart murmur    Hypertension    Knee pain    Lesion of lung    Low HDL (under 40)    Obesity    Urinary hesitancy     SURGICAL HISTORY: Past Surgical History:  Procedure Laterality Date   HERNIA REPAIR  2012   Umbilical Hernia Repair   IR IMAGING GUIDED PORT INSERTION  06/21/2023   KNEE ARTHROSCOPY Left 04/10/2015   Procedure: ARTHROSCOPY KNEE, partial medial meniscectomy, partial synovectomy;  Surgeon: Molli Angelucci, MD;  Location: [facility] ORS;  Service: Orthopedics;  Laterality: Left;    SOCIAL HISTORY: Social History   Socioeconomic History   Marital status: Widowed    Spouse name: Not on file   Number of children: Not on file   Years of education: Not on file   Highest education level: Not on file  Occupational History   Not on file  Tobacco Use   Smoking status: Every Day    Current packs/day: 1.00    Types: Cigarettes   Smokeless tobacco: Never  Vaping Use   Vaping status: Never Used  Substance and Sexual Activity   Alcohol use: No    Alcohol/week: 0.0 standard drinks of alcohol   Drug use: No   Sexual activity: Not Currently  Other Topics Concern   Not on file   Social History Narrative   Lives in [place]; self; daughter lives 1 mile. Smoke 1ppd > 65 years; stopped 25 years ago.  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    Social Drivers of Corporate investment banker Strain: Not on file  Food Insecurity: No Food Insecurity (04/08/2023)   Hunger Vital Sign    Worried About Running Out of Food in the Last Year: Never true    Ran Out of Food in the Last Year: Never true  Transportation Needs: No Transportation Needs (04/08/2023)   PRAPARE - Administrator, Civil Service (Medical): No    Lack of Transportation (Non-Medical): No  Physical Activity: Not on file  Stress: Not on file  Social Connections: Moderately Integrated (04/08/2023)   Social Connection and Isolation Panel [NHANES]    Frequency of Communication with Friends and Family: More than three times a week    Frequency of Social Gatherings with Friends and Family: More than three times a week    Attends Religious Services: 1 to 4 times per year    Active Member of [employer] or Organizations: Yes    Attends Banker Meetings: 1 to 4 times per year  Marital Status: Widowed  Intimate Partner Violence: Not At Risk (04/08/2023)   Humiliation, Afraid, Rape, and Kick questionnaire    Fear of Current or Ex-Partner: No    Emotionally Abused: No    Physically Abused: No    Sexually Abused: No    FAMILY HISTORY: Family History  Problem Relation Age of Onset   Diabetes Mother    Lung cancer Mother    Lymphoma Son 28       died in 18.    Kidney disease Neg Hx    Prostate cancer Neg Hx     ALLERGIES:  has no known allergies.  MEDICATIONS:  Current Outpatient Medications  Medication Sig Dispense Refill   Blood Glucose Monitoring Suppl DEVI 1 each by Does not apply route in the morning, at noon, and at bedtime. May substitute to any manufacturer covered by patient's insurance. 1 each 0   Glucose Blood (BLOOD GLUCOSE TEST STRIPS) STRP 1 each  by In Vitro route in the morning, at noon, and at bedtime. May substitute to any manufacturer covered by patient's insurance. 100 strip 3   Lancet Device MISC 1 each by Does not apply route in the morning, at noon, and at bedtime. May substitute to any manufacturer covered by patient's insurance. 90 each 3   Lancets (FREESTYLE) lancets Use 2-3 times a day or as instructed 100 each 12   acetaminophen  (TYLENOL ) 650 MG CR tablet Take 650 mg by mouth every 8 (eight) hours as needed for pain.     clopidogrel  (PLAVIX ) 75 MG tablet Take 1 tablet (75 mg total) by mouth daily. 21 tablet 0   cyanocobalamin  2000 MCG tablet Take 1 tablet (2,000 mcg total) by mouth daily. 90 tablet 1   enzalutamide  (XTANDI ) 40 MG tablet Take 3 tablets (120 mg total) by mouth daily. 90 tablet 3   finasteride  (PROSCAR ) 5 MG tablet TAKE 1 TABLET BY MOUTH EVERY DAY 90 tablet 3   furosemide  (LASIX ) 20 MG tablet Take 1 tablet (20 mg total) by mouth daily. 15 tablet 0   GLUMETZA 1000 MG 24 hr tablet Take 1,000 mg by mouth 2 (two) times daily.     lidocaine -prilocaine  (EMLA ) cream Apply 1 Application topically as needed. Apply to port and cover with saran wrap 1-2 hours prior to port access 30 g 1   metFORMIN (GLUCOPHAGE) 1000 MG tablet Take 1,000 mg by mouth 2 (two) times daily with a meal.     ondansetron  (ZOFRAN ) 8 MG tablet Take 1 tablet (8 mg total) by mouth every 8 (eight) hours as needed for nausea or vomiting. (Patient not taking: Reported on 06/28/2023) 20 tablet 0   prochlorperazine  (COMPAZINE ) 10 MG tablet Take 1 tablet (10 mg total) by mouth every 6 (six) hours as needed for nausea or vomiting. (Patient not taking: Reported on 06/28/2023) 30 tablet 2   rosuvastatin  (CRESTOR ) 20 MG tablet Take 2 tablets (40 mg total) by mouth at bedtime. 60 tablet 6   telmisartan (MICARDIS) 20 MG tablet Take 1 tablet by mouth daily.     thiamine  100 MG tablet TAKE 1 TABLET BY MOUTH EVERY DAY 90 tablet 1   traMADol  (ULTRAM ) 50 MG tablet Take 1  tablet (50 mg total) by mouth daily as needed. (Patient not taking: Reported on 06/28/2023) 60 tablet 0   No current facility-administered medications for this visit.      Aaron Aas  PHYSICAL EXAMINATION: ECOG PERFORMANCE STATUS: 0 - Asymptomatic  Vitals:   07/19/23 0842  BP: 134/60  Pulse: 78  Resp: 18  Temp: (!) 97.3 F (36.3 C)  SpO2: 99%        Filed Weights   07/19/23 0842  Weight: 209 lb (94.8 kg)        Physical Exam HENT:     Head: Normocephalic and atraumatic.     Mouth/Throat:     Pharynx: No oropharyngeal exudate.  Eyes:     Pupils: Pupils are equal, round, and reactive to light.  Cardiovascular:     Rate and Rhythm: Normal rate and regular rhythm.  Pulmonary:     Effort: No respiratory distress.     Breath sounds: No wheezing.     Comments: Decreased breath sounds bilaterally. Abdominal:     General: Bowel sounds are normal. There is no distension.     Palpations: Abdomen is soft. There is no mass.     Tenderness: There is no abdominal tenderness. There is no guarding or rebound.  Musculoskeletal:        General: No tenderness. Normal range of motion.     Cervical back: Normal range of motion and neck supple.  Skin:    General: Skin is warm.  Neurological:     Mental Status: He is alert and oriented to person, place, and time.  Psychiatric:        Mood and Affect: Affect normal.      LABORATORY DATA:  I have reviewed the data as listed Lab Results  Component Value Date   WBC 12.3 (H) 07/19/2023   HGB 10.3 (L) 07/19/2023   HCT 32.7 (L) 07/19/2023   MCV 84.7 07/19/2023   PLT 642 (H) 07/19/2023   Recent Labs    05/20/23 1502 06/28/23 0829 07/19/23 0832  NA 134* 137 138  K 3.7 3.9 3.9  CL 102 104 105  CO2 23 23 24   GLUCOSE 148* 148* 170*  BUN 18 19 15   CREATININE 1.21 1.23 1.07  CALCIUM  9.2 9.2 9.1  GFRNONAA 59* 58* >60  PROT 7.2 6.8 6.8  ALBUMIN 3.4* 3.5 3.1*  AST 11* 12* 14*  ALT 10 6 7   ALKPHOS 121 109 108  BILITOT 0.5 0.6  0.3    Latest Reference Range & Units 01/23/20 12:22 03/10/20 14:11 03/19/20 09:19 04/16/20 09:10 05/14/20 10:06 06/13/20 09:26 07/11/20 10:13 08/07/20 09:24 09/04/20 10:13 09/16/20 13:59 10/21/20 13:59 12/23/20 13:27  Prostatic Specific Antigen 0.00 - 4.00 ng/mL 986.00 (H) 34.69 (H) 21.59 (H) 2.68 1.21 0.71 0.68 0.41 0.36 0.28 0.29 0.28  (H): Data is abnormally high  RADIOGRAPHIC STUDIES: I have personally reviewed the radiological images as listed and agreed with the findings in the report. IR IMAGING GUIDED PORT INSERTION Result Date: 06/21/2023 CLINICAL DATA:  Metastatic prostate carcinoma and need for porta cath for chemotherapy. EXAM: IMPLANTED PORT A CATH PLACEMENT WITH ULTRASOUND AND FLUOROSCOPIC GUIDANCE ANESTHESIA/SEDATION: Moderate (conscious) sedation was employed during this procedure. A total of Versed  1.5 mg and Fentanyl  50 mcg was administered intravenously. Moderate Sedation Time: 30 minutes. The patient's level of consciousness and vital signs were monitored continuously by radiology nursing throughout the procedure under my direct supervision. FLUOROSCOPY: 62 seconds.  6.3 mGy. PROCEDURE: The procedure, risks, benefits, and alternatives were explained to the patient. Questions regarding the procedure were encouraged and answered. The patient understands and consents to the procedure. A time-out was performed prior to initiating the procedure. Ultrasound was utilized to confirm patency of the right internal jugular vein. An ultrasound image was saved and recorded. The right neck and chest  were prepped with chlorhexidine  in a sterile fashion, and a sterile drape was applied covering the operative field. Maximum barrier sterile technique with sterile gowns and gloves were used for the procedure. Local anesthesia was provided with 1% lidocaine . After creating a small venotomy incision, a 21 gauge needle was advanced into the right internal jugular vein under direct, real-time ultrasound  guidance. Ultrasound image documentation was performed. After securing guidewire access, an 8 Fr dilator was placed. A J-wire was kinked to measure appropriate catheter length. A subcutaneous port pocket was then created along the upper chest wall utilizing sharp and blunt dissection. Portable cautery was utilized. The pocket was irrigated with sterile saline. A single lumen power injectable port was chosen for placement. The 8 Fr catheter was tunneled from the port pocket site to the venotomy incision. The port was placed in the pocket. External catheter was trimmed to appropriate length based on guidewire measurement. At the venotomy, an 8 Fr peel-away sheath was placed over a guidewire. The catheter was then placed through the sheath and the sheath removed. Final catheter positioning was confirmed and documented with a fluoroscopic spot image. The port was accessed with a needle and aspirated and flushed with heparinized saline. The access needle was removed. The venotomy and port pocket incisions were closed with subcutaneous 3-0 Monocryl and subcuticular 4-0 Vicryl. Dermabond was applied to both incisions. COMPLICATIONS: COMPLICATIONS None FINDINGS: After catheter placement, the tip lies at the cavo-atrial junction. The catheter aspirates normally and is ready for immediate use. IMPRESSION: Placement of single lumen port a cath via right internal jugular vein. The catheter tip lies at the cavo-atrial junction. A power injectable port a cath was placed and is ready for immediate use. Electronically Signed   By: Erica Hau M.D.   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 NGS-F-ONE 2022- NEGATIVE for any targets. # MARCH 2025- PET scan shows progression-although Lesions are subcentimeter in size; also PSA is rising. APRIL 2025-Taxotere   chemotherapy-every 3 weeks x 6 cycles.  # Proceed with Taxotere   cycle #2; Labs-CBC/chemistries were reviewed with the patient. growth factor on day 2 [ reduce to 60 mg/m].    # Hot flashes- G-1sec to ADT-stable.  # RUL nodule- ~2-3cm; cystic [on PET AUG 2022]-  No new dominant mass, fluid collection or lymph node enlargement in the thorax. Stable- on most recent PET scan in OCT 2024.  Will monitor on chemotherapy.    # Chest tenderness-grade 1-2. Sec to ADT- continue tramadol  prn-refiled; add vaseline BID- stable.   # DM-on metformin-oral hypoglycemic drugs- monitor closley on chemo/steroids- RBG- 170-will send a script for glucometer- check 2-3 times a day- .   # Weight loss: # refer to nutrition/Joli re: weight loss  # IV access:s/p  Mediport placement.  # ACP: FULL code.   * eliagrd 45 11/12//2024- due on mid MAY 2025; PSA- poor surrogate.   PS # DISPOSITION:   # refer to nutrition/Joli re: weight loss # chemo today; shot tomorrow # Follow up in 3   weeks-- MD-;labs- cbc/cmp/PSA; Taxoetere chemo;Eligard -  D-2 injection # Follow up in 6 weeks-- MD-;labs- cbc/cmp/PSA; Taxoetere chemo;  D-2 injection-Dr.B   All questions were answered. The patient knows to call the clinic with any problems, questions or concerns.    Gwyn Leos, MD 07/19/2023 9:22 AM

## 2023-07-19 NOTE — Assessment & Plan Note (Addendum)
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 NGS-F-ONE 2022- NEGATIVE for any targets. # MARCH 2025- PET scan shows progression-although Lesions are subcentimeter in size; also PSA is rising. APRIL 2025-Taxotere  chemotherapy-every 3 weeks x 6 cycles.  # Proceed with Taxotere   cycle #2; Labs-CBC/chemistries were reviewed with the patient. growth factor on day 2 [ reduce to 60 mg/m].    # Hot flashes- G-1sec to ADT-stable.  # RUL nodule- ~2-3cm; cystic [on PET AUG 2022]-  No new dominant mass, fluid collection or lymph node enlargement in the thorax. Stable- on most recent PET scan in OCT 2024.  Will monitor on chemotherapy.    # Chest tenderness-grade 1-2. Sec to ADT- continue tramadol  prn-refiled; add vaseline BID- stable.   # DM-on metformin-oral hypoglycemic drugs- monitor closley on chemo/steroids- RBG- 170-will send a script for glucometer- check 2-3 times a day- .   # Weight loss: # refer to nutrition/Joli re: weight loss  # IV access:s/p  Mediport placement.  # ACP: FULL code.   * eliagrd 45 11/12//2024- due on mid MAY 2025; PSA- poor surrogate.   PS # DISPOSITION:   # refer to nutrition/Joli re: weight loss # chemo today; shot tomorrow # Follow up in 3   weeks-- MD-;labs- cbc/cmp/PSA; Taxoetere chemo;Eligard -  D-2 injection # Follow up in 6 weeks-- MD-;labs- cbc/cmp/PSA; Taxoetere chemo;  D-2 injection-Dr.B

## 2023-07-19 NOTE — Progress Notes (Signed)
 Patient is doing well, no new questions or concerns for the doctor today.

## 2023-07-19 NOTE — Patient Instructions (Signed)
 [facility] - A DEPT OF [place]  Discharge Instructions: Thank you for choosing [facility] to provide your oncology and hematology care.  If you have a lab appointment with the Cancer Center, please go directly to the Cancer Center and check in at the registration area.  Wear comfortable clothing and clothing appropriate for easy access to any Portacath or PICC line.   We strive to give you quality time with your provider. You may need to reschedule your appointment if you arrive late (15 or more minutes).  Arriving late affects you and other patients whose appointments are after yours.  Also, if you miss three or more appointments without notifying the office, you may be dismissed from the clinic at the provider's discretion.      For prescription refill requests, have your pharmacy contact our office and allow 72 hours for refills to be completed.    Today you received the following chemotherapy and/or immunotherapy agents TAXOTERE       To help prevent nausea and vomiting after your treatment, we encourage you to take your nausea medication as directed.  BELOW ARE SYMPTOMS THAT SHOULD BE REPORTED IMMEDIATELY: *FEVER GREATER THAN 100.4 F (38 C) OR HIGHER *CHILLS OR SWEATING *NAUSEA AND VOMITING THAT IS NOT CONTROLLED WITH YOUR NAUSEA MEDICATION *UNUSUAL SHORTNESS OF BREATH *UNUSUAL BRUISING OR BLEEDING *URINARY PROBLEMS (pain or burning when urinating, or frequent urination) *BOWEL PROBLEMS (unusual diarrhea, constipation, pain near the anus) TENDERNESS IN MOUTH AND THROAT WITH OR WITHOUT PRESENCE OF ULCERS (sore throat, sores in mouth, or a toothache) UNUSUAL RASH, SWELLING OR PAIN  UNUSUAL VAGINAL DISCHARGE OR ITCHING   Items with * indicate a potential emergency and should be followed up as soon as possible or go to the Emergency Department if any problems should occur.  Please show the CHEMOTHERAPY ALERT CARD or IMMUNOTHERAPY  ALERT CARD at check-in to the Emergency Department and triage nurse.  Should you have questions after your visit or need to cancel or reschedule your appointment, please contact [facility] - A DEPT OF [facility]  [phone] and follow the prompts.  Office hours are 8:00 a.m. to 4:30 p.m. Monday - Friday. Please note that voicemails left after 4:00 p.m. may not be returned until the following business day.  We are closed weekends and major holidays. You have access to a nurse at all times for urgent questions. Please call the main number to the clinic [phone] and follow the prompts.  For any non-urgent questions, you may also contact your provider using MyChart. We now offer e-Visits for anyone 75 and older to request care online for non-urgent symptoms. For details visit [email or web address].   Also download the MyChart app! Go to the app store, search "MyChart", open the app, select [facility] [place], and log in with your MyChart username and password.  Docetaxel  Injection What is this medication? DOCETAXEL  (doe se TAX el) treats some types of cancer. It works by slowing down the growth of cancer cells. This medicine may be used for other purposes; ask your health care provider or pharmacist if you have questions. COMMON BRAND NAME(S): BEIZRAY, Docefrez , Docivyx , Taxotere  What should I tell my care team before I take this medication? They need to know if you have any of these conditions: Kidney disease Liver disease Low white blood cell levels Tingling of the fingers or toes or other nerve disorder An unusual or allergic  reaction to docetaxel , polysorbate 80, other medications, foods, dyes, or preservatives Pregnant or trying to get pregnant Breast-feeding How should I use this medication? This medication is injected into a vein. It is given by your care team in a hospital or clinic setting. Talk to your care team about the use of this medication in  children. Special care may be needed. Overdosage: If you think you have taken too much of this medicine contact a poison control center or emergency room at once. NOTE: This medicine is only for you. Do not share this medicine with others. What if I miss a dose? Keep appointments for follow-up doses. It is important not to miss your dose. Call your care team if you are unable to keep an appointment. What may interact with this medication? Do not take this medication with any of the following: Live virus vaccines This medication may also interact with the following: Certain antibiotics, such as clarithromycin, telithromycin Certain antivirals for HIV or hepatitis Certain medications for fungal infections, such as itraconazole, ketoconazole, voriconazole Grapefruit juice Nefazodone Supplements, such as St. John's wort This list may not describe all possible interactions. Give your health care provider a list of all the medicines, herbs, non-prescription drugs, or dietary supplements you use. Also tell them if you smoke, drink alcohol, or use illegal drugs. Some items may interact with your medicine. What should I watch for while using this medication? This medication may make you feel generally unwell. This is not uncommon as chemotherapy can affect healthy cells as well as cancer cells. Report any side effects. Continue your course of treatment even though you feel ill unless your care team tells you to stop. You may need blood work done while you are taking this medication. This medication can cause serious side effects and infusion reactions. To reduce the risk, your care team may give you other medications to take before receiving this one. Be sure to follow the directions from your care team. This medication may increase your risk of getting an infection. Call your care team for advice if you get a fever, chills, sore throat, or other symptoms of a cold or flu. Do not treat yourself. Try to  avoid being around people who are sick. Avoid taking medications that contain aspirin , acetaminophen , ibuprofen, naproxen, or ketoprofen unless instructed by your care team. These medications may hide a fever. Be careful brushing or flossing your teeth or using a toothpick because you may get an infection or bleed more easily. If you have any dental work done, tell your dentist you are receiving this medication. Some products may contain alcohol. Ask your care team if this medication contains alcohol. Be sure to tell all care teams you are taking this medicine. Certain medications, like metronidazole  and disulfiram, can cause an unpleasant reaction when taken with alcohol. The reaction includes flushing, headache, nausea, vomiting, sweating, and increased thirst. The reaction can last from 30 minutes to several hours. This medication may affect your coordination, reaction time, or judgement. Do not drive or operate machinery until you know how this medication affects you. Sit up or stand slowly to reduce the risk of dizzy or fainting spells. Drinking alcohol with this medication can increase the risk of these side effects. Talk to your care team about your risk of cancer. You may be more at risk for certain types of cancer if you take this medication. Talk to your care team if you wish to become pregnant or think you might be pregnant. This  medication can cause serious birth defects if taken during pregnancy or if you get pregnant within 2 months after stopping therapy. A negative pregnancy test is required before starting this medication. A reliable form of contraception is recommended while taking this medication and for 2 months after stopping it. Talk to your care team about reliable forms of contraception. Do not breast-feed while taking this medication and for 1 week after stopping therapy. Use a condom during sex and for 4 months after stopping therapy. Tell your care team right away if you think your  partner might be pregnant. This medication can cause serious birth defects. This medication may cause infertility. Talk to your care team if you are concerned about your fertility. What side effects may I notice from receiving this medication? Side effects that you should report to your care team as soon as possible: Allergic reactions--skin rash, itching, hives, swelling of the face, lips, tongue, or throat Change in vision such as blurry vision, seeing halos around lights, vision loss Infection--fever, chills, cough, or sore throat Infusion reactions--chest pain, shortness of breath or trouble breathing, feeling faint or lightheaded Low red blood cell level--unusual weakness or fatigue, dizziness, headache, trouble breathing Pain, tingling, or numbness in the hands or feet Painful swelling, warmth, or redness of the skin, blisters or sores at the infusion site Redness, blistering, peeling, or loosening of the skin, including inside the mouth Sudden or severe stomach pain, bloody diarrhea, fever, nausea, vomiting Swelling of the ankles, hands, or feet Tumor lysis syndrome (TLS)--nausea, vomiting, diarrhea, decrease in the amount of urine, dark urine, unusual weakness or fatigue, confusion, muscle pain or cramps, fast or irregular heartbeat, joint pain Unusual bruising or bleeding Side effects that usually do not require medical attention (report to your care team if they continue or are bothersome): Change in nail shape, thickness, or color Change in taste Hair loss Increased tears This list may not describe all possible side effects. Call your doctor for medical advice about side effects. You may report side effects to FDA at 1-800-FDA-1088. Where should I keep my medication? This medication is given in a hospital or clinic. It will not be stored at home. NOTE: This sheet is a summary. It may not cover all possible information. If you have questions about this medicine, talk to your doctor,  pharmacist, or health care provider.  2024 Elsevier/Gold Standard (2021-04-30 00:00:00)

## 2023-07-20 ENCOUNTER — Inpatient Hospital Stay

## 2023-07-20 ENCOUNTER — Ambulatory Visit

## 2023-07-20 ENCOUNTER — Other Ambulatory Visit

## 2023-07-20 ENCOUNTER — Other Ambulatory Visit: Payer: Self-pay

## 2023-07-20 ENCOUNTER — Ambulatory Visit: Admitting: Internal Medicine

## 2023-07-20 DIAGNOSIS — Z5111 Encounter for antineoplastic chemotherapy: Secondary | ICD-10-CM | POA: Diagnosis not present

## 2023-07-20 DIAGNOSIS — C61 Malignant neoplasm of prostate: Secondary | ICD-10-CM

## 2023-07-20 MED ORDER — PEGFILGRASTIM-CBQV 6 MG/0.6ML ~~LOC~~ SOSY
6.0000 mg | PREFILLED_SYRINGE | Freq: Once | SUBCUTANEOUS | Status: AC
  Administered 2023-07-20: 6 mg via SUBCUTANEOUS
  Filled 2023-07-20: qty 0.6

## 2023-07-26 ENCOUNTER — Encounter: Payer: Self-pay | Admitting: Internal Medicine

## 2023-07-27 ENCOUNTER — Inpatient Hospital Stay

## 2023-07-27 NOTE — Progress Notes (Signed)
 Nutrition  Patient did not show up for scheduled nutrition visit today.  Will ask scheduling to offer another appointment.   Forest Redwine B. Zollie Hipp, CSO, LDN Registered Dietitian [phone]

## 2023-08-08 ENCOUNTER — Encounter: Payer: Self-pay | Admitting: Internal Medicine

## 2023-08-08 ENCOUNTER — Inpatient Hospital Stay

## 2023-08-08 ENCOUNTER — Inpatient Hospital Stay (HOSPITAL_BASED_OUTPATIENT_CLINIC_OR_DEPARTMENT_OTHER): Attending: Internal Medicine | Admitting: Internal Medicine

## 2023-08-08 ENCOUNTER — Inpatient Hospital Stay: Attending: Internal Medicine

## 2023-08-08 VITALS — BP 120/60 | HR 76 | Temp 97.9°F | Resp 20 | Ht 72.0 in | Wt 207.8 lb

## 2023-08-08 DIAGNOSIS — Z5189 Encounter for other specified aftercare: Secondary | ICD-10-CM | POA: Diagnosis not present

## 2023-08-08 DIAGNOSIS — Z5111 Encounter for antineoplastic chemotherapy: Secondary | ICD-10-CM | POA: Diagnosis present

## 2023-08-08 DIAGNOSIS — C7951 Secondary malignant neoplasm of bone: Secondary | ICD-10-CM | POA: Diagnosis present

## 2023-08-08 DIAGNOSIS — C61 Malignant neoplasm of prostate: Secondary | ICD-10-CM

## 2023-08-08 DIAGNOSIS — F17210 Nicotine dependence, cigarettes, uncomplicated: Secondary | ICD-10-CM | POA: Diagnosis not present

## 2023-08-08 DIAGNOSIS — C7800 Secondary malignant neoplasm of unspecified lung: Secondary | ICD-10-CM | POA: Diagnosis not present

## 2023-08-08 DIAGNOSIS — Z79899 Other long term (current) drug therapy: Secondary | ICD-10-CM | POA: Insufficient documentation

## 2023-08-08 LAB — CBC WITH DIFFERENTIAL (CANCER CENTER ONLY)
Abs Immature Granulocytes: 0.04 10*3/uL (ref 0.00–0.07)
Basophils Absolute: 0.1 10*3/uL (ref 0.0–0.1)
Basophils Relative: 1 %
Eosinophils Absolute: 0.1 10*3/uL (ref 0.0–0.5)
Eosinophils Relative: 1 %
HCT: 33.7 % — ABNORMAL LOW (ref 39.0–52.0)
Hemoglobin: 10.6 g/dL — ABNORMAL LOW (ref 13.0–17.0)
Immature Granulocytes: 0 %
Lymphocytes Relative: 30 %
Lymphs Abs: 3.5 10*3/uL (ref 0.7–4.0)
MCH: 27 pg (ref 26.0–34.0)
MCHC: 31.5 g/dL (ref 30.0–36.0)
MCV: 86 fL (ref 80.0–100.0)
Monocytes Absolute: 1.1 10*3/uL — ABNORMAL HIGH (ref 0.1–1.0)
Monocytes Relative: 9 %
Neutro Abs: 6.8 10*3/uL (ref 1.7–7.7)
Neutrophils Relative %: 59 %
Platelet Count: 334 10*3/uL (ref 150–400)
RBC: 3.92 MIL/uL — ABNORMAL LOW (ref 4.22–5.81)
RDW: 17.9 % — ABNORMAL HIGH (ref 11.5–15.5)
WBC Count: 11.7 10*3/uL — ABNORMAL HIGH (ref 4.0–10.5)
nRBC: 0 % (ref 0.0–0.2)

## 2023-08-08 LAB — CMP (CANCER CENTER ONLY)
ALT: 7 U/L (ref 0–44)
AST: 14 U/L — ABNORMAL LOW (ref 15–41)
Albumin: 3.5 g/dL (ref 3.5–5.0)
Alkaline Phosphatase: 101 U/L (ref 38–126)
Anion gap: 10 (ref 5–15)
BUN: 16 mg/dL (ref 8–23)
CO2: 22 mmol/L (ref 22–32)
Calcium: 8.8 mg/dL — ABNORMAL LOW (ref 8.9–10.3)
Chloride: 106 mmol/L (ref 98–111)
Creatinine: 0.95 mg/dL (ref 0.61–1.24)
GFR, Estimated: >60 mL/min (ref >60)
Glucose, Bld: 194 mg/dL — ABNORMAL HIGH (ref 70–99)
Potassium: 3.6 mmol/L (ref 3.5–5.1)
Sodium: 138 mmol/L (ref 135–145)
Total Bilirubin: 0.5 mg/dL (ref 0.0–1.2)
Total Protein: 6.8 g/dL (ref 6.5–8.1)

## 2023-08-08 MED ORDER — SODIUM CHLORIDE 0.9 % IV SOLN
INTRAVENOUS | Status: DC
  Filled 2023-08-08: qty 250

## 2023-08-08 MED ORDER — LEUPROLIDE ACETATE (6 MONTH) 45 MG ~~LOC~~ KIT
45.0000 mg | PACK | Freq: Once | SUBCUTANEOUS | Status: AC
  Administered 2023-08-08: 45 mg via SUBCUTANEOUS
  Filled 2023-08-08: qty 45

## 2023-08-08 MED ORDER — SODIUM CHLORIDE 0.9% FLUSH
10.0000 mL | INTRAVENOUS | Status: DC | PRN
  Filled 2023-08-08: qty 10

## 2023-08-08 MED ORDER — DEXAMETHASONE SODIUM PHOSPHATE 10 MG/ML IJ SOLN
10.0000 mg | Freq: Once | INTRAMUSCULAR | Status: AC
  Administered 2023-08-08: 10 mg via INTRAVENOUS
  Filled 2023-08-08: qty 1

## 2023-08-08 MED ORDER — SODIUM CHLORIDE 0.9 % IV SOLN
60.0000 mg/m2 | Freq: Once | INTRAVENOUS | Status: AC
  Administered 2023-08-08: 134 mg via INTRAVENOUS
  Filled 2023-08-08: qty 13.4

## 2023-08-08 MED ORDER — HEPARIN SOD (PORK) LOCK FLUSH 100 UNIT/ML IV SOLN
500.0000 [IU] | Freq: Once | INTRAVENOUS | Status: AC | PRN
  Administered 2023-08-08: 500 [IU]
  Filled 2023-08-08: qty 5

## 2023-08-08 NOTE — Assessment & Plan Note (Addendum)
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 NGS-F-ONE 2022- NEGATIVE for any targets. # MARCH 2025- PET scan shows progression-although Lesions are subcentimeter in size; also PSA is rising. APRIL 2025-Taxotere  chemotherapy-every 3 weeks x 6 cycles.  # Proceed with Taxotere   cycle #3 ; Labs-CBC/chemistries were reviewed with the patient. growth factor on day 2 [ reduce to 60 mg/m]. PSA- rising- monitor closely-  Will plan repeating scans after 6 cycles.   # Hot flashes- G-1sec to ADT-stable.  # RUL nodule- ~2-3cm; cystic [on PET AUG 2022]-  No new dominant mass, fluid collection or lymph node enlargement in the thorax. on most recent PET scan in OCT 2024.  Will monitor on chemotherapy-stable.    # Chest tenderness-grade 1-2. Sec to ADT- continue tramadol  prn-refiled; add vaseline BID- stable.   # DM-on metformin-oral hypoglycemic drugs- monitor closley on chemo/steroids- RBG- 170-will send a script for glucometer- check 2-3 times a day- .   # Weight loss: # refer to nutrition/Joli re: weight loss  # IV access:s/p  Mediport placement.  # ACP: FULL code.   * eliagrd 44 -08/08/2023- PSA- poor surrogate.   PS # DISPOSITION:   # chemo today;Eligard  today-  shot tomorrow # Follow up in 3   weeks-- MD-;labs- cbc/cmp/PSA; Taxoetere chemo;  D-2 injection # Follow up in 6 weeks-- MD-;labs- cbc/cmp/PSA; Taxoetere chemo;  D-2 injection-Dr.B

## 2023-08-08 NOTE — Progress Notes (Signed)
  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   # NOV 2021- CTA [ER] Diffuse bilateral pulmonary nodules including a cavitary lesion in the right upper lobe measuring approximately 2 cm. There is extensive nodular pleural thickening bilaterally, greatest in the left lower lobe.  November 2021 PET scan-multiple lung lesions multiple bone lesions and prostate uptake.  February 2022-prostate biopsy [Dr.Brandon]   # DM-2; NO COPD [?]; ACTIVE SMOKER 65ppd.  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 No pathogenic variants identified on the [facility] Multi-Cancer Panel. VUS in AXIN2 called c.1235A>C identified. The report date is 03/23/2020.   The Multi-Cancer Panel offered by [facility]  includes sequencing and/or deletion duplication testing of the following 84 genes: AIP, ALK, APC, ATM, AXIN2,BAP1,  BARD1, BLM, BMPR1A, BRCA1, BRCA2, BRIP1, CASR, CDC73, CDH1, CDK4, CDKN1B, CDKN1C, CDKN2A (p14ARF), CDKN2A (p16INK4a), CEBPA, CHEK2, CTNNA1, DICER1, DIS3L2, EGFR (c.2369C>T, p.Thr790Met variant only), EPCAM (Deletion/duplication testing only), FH, FLCN, GATA2, GPC3, GREM1 (Promoter region deletion/duplication testing only), HOXB13 (c.251G>A, p.Gly84Glu), HRAS, KIT, MAX, MEN1, MET, MITF (c.952G>A, p.Glu318Lys variant only), MLH1, MSH2, MSH3, MSH6, MUTYH, NBN, NF1, NF2, NTHL1, PALB2, PDGFRA, PHOX2B, PMS2, POLD1, POLE, POT1, PRKAR1A, PTCH1, PTEN, RAD50, RAD51C, RAD51D, RB1, RECQL4, RET, RUNX1, SDHAF2, SDHA (sequence changes only), SDHB, SDHC, SDHD, SMAD4, SMARCA4, SMARCB1, SMARCE1, STK11, SUFU, TERC, TERT, TMEM127, TP53, TSC1, TSC2, VHL, WRN and WT1.    06/28/2023 -  Chemotherapy   Patient is on Treatment Plan : PROSTATE Docetaxel  (75) + Prednisone  q21d       HISTORY OF PRESENTING ILLNESS: Ambulating independently. Alone,   MARCELLA Glass 83 y.o.  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  Patient is doing well, no new questions or concerns for the doctor today. But has lost weight. Positive for hair loss.   No falls.  No fever no chills.  No constipation no diarrhea.  No nausea no vomiting. Denies any worsening pain. No tingling or numbness.   Review of Systems  Constitutional:  Positive for malaise/fatigue. Negative for chills, diaphoresis and fever.  HENT:  Negative for nosebleeds and sore throat.   Eyes:  Negative for double vision.  Respiratory:  Negative for hemoptysis, sputum production, shortness of breath and wheezing.  Cardiovascular:  Negative for chest pain, palpitations, orthopnea and leg swelling.  Gastrointestinal:  Negative for blood in stool, constipation, diarrhea,  heartburn, melena, nausea and vomiting.  Genitourinary:  Negative for dysuria, frequency and urgency.  Musculoskeletal:  Positive for back pain and myalgias. Negative for joint pain.  Skin: Negative.  Negative for itching and rash.  Neurological:  Negative for dizziness, tingling, focal weakness, weakness and headaches.  Endo/Heme/Allergies:  Does not bruise/bleed easily.  Psychiatric/Behavioral:  Negative for depression. The patient is not nervous/anxious and does not have insomnia.      MEDICAL HISTORY:  Past Medical History:  Diagnosis Date   Arthritis    BPH (benign prostatic hyperplasia)    BPH with obstruction/lower urinary tract symptoms    Diabetes (HCC)    Disorder resulting from impaired renal function    Elevated PSA    Family history of lung cancer    Family history of lymphoma    Heart murmur    Hypertension    Knee pain    Lesion of lung    Low HDL (under 40)    Obesity    Urinary hesitancy     SURGICAL HISTORY: Past Surgical History:  Procedure Laterality Date   HERNIA REPAIR  2012   Umbilical Hernia Repair   IR IMAGING GUIDED PORT INSERTION  06/21/2023   KNEE ARTHROSCOPY Left 04/10/2015   Procedure: ARTHROSCOPY KNEE, partial medial meniscectomy, partial synovectomy;  Surgeon: Molli Angelucci, MD;  Location: [facility] ORS;  Service: Orthopedics;  Laterality: Left;    SOCIAL HISTORY: Social History   Socioeconomic History   Marital status: Widowed    Spouse name: Not on file   Number of children: Not on file   Years of education: Not on file   Highest education level: Not on file  Occupational History   Not on file  Tobacco Use   Smoking status: Every Day    Current packs/day: 1.00    Types: Cigarettes   Smokeless tobacco: Never  Vaping Use   Vaping status: Never Used  Substance and Sexual Activity   Alcohol use: No    Alcohol/week: 0.0 standard drinks of alcohol   Drug use: No   Sexual activity: Not Currently  Other Topics Concern   Not on file   Social History Narrative   Lives in [place]; self; daughter lives 1 mile. Smoke 1ppd > 65 years; stopped 25 years ago.  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    Social Drivers of Corporate investment banker Strain: Not on file  Food Insecurity: No Food Insecurity (07/27/2023)   [facility]   Hunger Vital Sign    Worried About Running Out of Food in the Last Year: Never true    Ran Out of Food in the Last Year: Never true  Transportation Needs: No Transportation Needs (07/27/2023)   [facility] from [facility] - Transportation    Lack of Transportation (Medical): No    Lack of Transportation (Non-Medical): No  Physical Activity: Not on file  Stress: Not on file  Social Connections: Moderately Integrated (04/08/2023)   Social Connection and Isolation Panel [NHANES]    Frequency of Communication with Friends and Family: More than three times a week    Frequency of Social Gatherings with Friends and Family: More than three times a week    Attends Religious Services: 1 to 4 times per year    Active Member of [employer] or Organizations: Yes  Attends Banker Meetings: 1 to 4 times per year    Marital Status: Widowed  Intimate Partner Violence: Not At Risk (04/08/2023)   Humiliation, Afraid, Rape, and Kick questionnaire    Fear of Current or Ex-Partner: No    Emotionally Abused: No    Physically Abused: No    Sexually Abused: No    FAMILY HISTORY: Family History  Problem Relation Age of Onset   Diabetes Mother    Lung cancer Mother    Lymphoma Son 28       died in 82.    Kidney disease Neg Hx    Prostate cancer Neg Hx     ALLERGIES:  has no known allergies.  MEDICATIONS:  Current Outpatient Medications  Medication Sig Dispense Refill   acetaminophen  (TYLENOL ) 650 MG CR tablet Take 650 mg by mouth every 8 (eight) hours as needed for pain.     Blood Glucose Monitoring Suppl DEVI 1 each by Does not apply route  in the morning, at noon, and at bedtime. May substitute to any manufacturer covered by patient's insurance. 1 each 0   clopidogrel  (PLAVIX ) 75 MG tablet Take 1 tablet (75 mg total) by mouth daily. 21 tablet 0   cyanocobalamin  2000 MCG tablet Take 1 tablet (2,000 mcg total) by mouth daily. 90 tablet 1   finasteride  (PROSCAR ) 5 MG tablet TAKE 1 TABLET BY MOUTH EVERY DAY 90 tablet 3   furosemide  (LASIX ) 20 MG tablet Take 1 tablet (20 mg total) by mouth daily. 15 tablet 0   Glucose Blood (BLOOD GLUCOSE TEST STRIPS) STRP 1 each by In Vitro route in the morning, at noon, and at bedtime. May substitute to any manufacturer covered by patient's insurance. 100 strip 3   GLUMETZA 1000 MG 24 hr tablet Take 1,000 mg by mouth 2 (two) times daily.     Lancet Device MISC 1 each by Does not apply route in the morning, at noon, and at bedtime. May substitute to any manufacturer covered by patient's insurance. 90 each 3   Lancets (FREESTYLE) lancets Use 2-3 times a day or as instructed 100 each 12   lidocaine -prilocaine  (EMLA ) cream Apply 1 Application topically as needed. Apply to port and cover with saran wrap 1-2 hours prior to port access 30 g 1   metFORMIN (GLUCOPHAGE) 1000 MG tablet Take 1,000 mg by mouth 2 (two) times daily with a meal.     rosuvastatin  (CRESTOR ) 20 MG tablet Take 2 tablets (40 mg total) by mouth at bedtime. 60 tablet 6   telmisartan (MICARDIS) 20 MG tablet Take 1 tablet by mouth daily.     thiamine  100 MG tablet TAKE 1 TABLET BY MOUTH EVERY DAY 90 tablet 1   ondansetron  (ZOFRAN ) 8 MG tablet Take 1 tablet (8 mg total) by mouth every 8 (eight) hours as needed for nausea or vomiting. (Patient not taking: Reported on 08/08/2023) 20 tablet 0   prochlorperazine  (COMPAZINE ) 10 MG tablet Take 1 tablet (10 mg total) by mouth every 6 (six) hours as needed for nausea or vomiting. (Patient not taking: Reported on 08/08/2023) 30 tablet 2   traMADol  (ULTRAM ) 50 MG tablet Take 1 tablet (50 mg total) by mouth  daily as needed. (Patient not taking: Reported on 08/08/2023) 60 tablet 0   No current facility-administered medications for this visit.   Facility-Administered Medications Ordered in Other Visits  Medication Dose Route Frequency Provider Last Rate Last Admin   0.9 %  sodium chloride  infusion   Intravenous  Continuous Dann Galicia R, MD 10 mL/hr at 08/08/23 1157 New Bag at 08/08/23 1157   DOCEtaxel  (TAXOTERE ) 134 mg in sodium chloride  0.9 % 250 mL chemo infusion  60 mg/m2 (Treatment Plan Recorded) Intravenous Once Tanara Turvey R, MD       heparin  lock flush 100 unit/mL  500 Units Intracatheter Once PRN Pati Thinnes R, MD       leuprolide  (6 Month) (ELIGARD ) injection 45 mg  45 mg Subcutaneous Once Tayven Renteria R, MD       sodium chloride  flush (NS) 0.9 % injection 10 mL  10 mL Intracatheter PRN Janyiah Silveri R, MD          .  PHYSICAL EXAMINATION: ECOG PERFORMANCE STATUS: 0 - Asymptomatic  Vitals:   08/08/23 0947  BP: 120/60  Pulse: 76  Resp: 20  Temp: 97.9 F (36.6 C)  SpO2: 100%        Filed Weights   08/08/23 0947  Weight: 207 lb 12.8 oz (94.3 kg)        Physical Exam HENT:     Head: Normocephalic and atraumatic.     Mouth/Throat:     Pharynx: No oropharyngeal exudate.  Eyes:     Pupils: Pupils are equal, round, and reactive to light.  Cardiovascular:     Rate and Rhythm: Normal rate and regular rhythm.  Pulmonary:     Effort: No respiratory distress.     Breath sounds: No wheezing.     Comments: Decreased breath sounds bilaterally. Abdominal:     General: Bowel sounds are normal. There is no distension.     Palpations: Abdomen is soft. There is no mass.     Tenderness: There is no abdominal tenderness. There is no guarding or rebound.  Musculoskeletal:        General: No tenderness. Normal range of motion.     Cervical back: Normal range of motion and neck supple.  Skin:    General: Skin is warm.  Neurological:      Mental Status: He is alert and oriented to person, place, and time.  Psychiatric:        Mood and Affect: Affect normal.      LABORATORY DATA:  I have reviewed the data as listed Lab Results  Component Value Date   WBC 11.7 (H) 08/08/2023   HGB 10.6 (L) 08/08/2023   HCT 33.7 (L) 08/08/2023   MCV 86.0 08/08/2023   PLT 334 08/08/2023   Recent Labs    06/28/23 0829 07/19/23 0832 08/08/23 1002  NA 137 138 138  K 3.9 3.9 3.6  CL 104 105 106  CO2 23 24 22   GLUCOSE 148* 170* 194*  BUN 19 15 16   CREATININE 1.23 1.07 0.95  CALCIUM  9.2 9.1 8.8*  GFRNONAA 58* >60 >60  PROT 6.8 6.8 6.8  ALBUMIN 3.5 3.1* 3.5  AST 12* 14* 14*  ALT 6 7 7   ALKPHOS 109 108 101  BILITOT 0.6 0.3 0.5    Latest Reference Range & Units 01/23/20 12:22 03/10/20 14:11 03/19/20 09:19 04/16/20 09:10 05/14/20 10:06 06/13/20 09:26 07/11/20 10:13 08/07/20 09:24 09/04/20 10:13 09/16/20 13:59 10/21/20 13:59 12/23/20 13:27  Prostatic Specific Antigen 0.00 - 4.00 ng/mL 986.00 (H) 34.69 (H) 21.59 (H) 2.68 1.21 0.71 0.68 0.41 0.36 0.28 0.29 0.28  (H): Data is abnormally high  RADIOGRAPHIC STUDIES: I have personally reviewed the radiological images as listed and agreed with the findings in the report. No results found.   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 NGS-F-ONE 2022- NEGATIVE for any targets. # MARCH 2025- PET scan shows progression-although Lesions are subcentimeter in size; also PSA is rising. APRIL 2025-Taxotere  chemotherapy-every 3 weeks x 6 cycles.  # Proceed with Taxotere   cycle #3 ; Labs-CBC/chemistries were reviewed with the patient. growth factor on day 2 [ reduce to 60 mg/m]. PSA- rising- monitor closely-  Will plan repeating scans after 6 cycles.   # Hot flashes- G-1sec to ADT-stable.  # RUL nodule- ~2-3cm; cystic [on PET AUG 2022]-  No new dominant mass, fluid  collection or lymph node enlargement in the thorax. on most recent PET scan in OCT 2024.  Will monitor on chemotherapy-stable.    # Chest tenderness-grade 1-2. Sec to ADT- continue tramadol  prn-refiled; add vaseline BID- stable.   # DM-on metformin-oral hypoglycemic drugs- monitor closley on chemo/steroids- RBG- 170-will send a script for glucometer- check 2-3 times a day- .   # Weight loss: # refer to nutrition/Joli re: weight loss  # IV access:s/p  Mediport placement.  # ACP: FULL code.   * eliagrd 45 -08/08/2023- PSA- poor surrogate.   PS # DISPOSITION:   # chemo today;Eligard  today-  shot tomorrow # Follow up in 3   weeks-- MD-;labs- cbc/cmp/PSA; Taxoetere chemo;  D-2 injection # Follow up in 6 weeks-- MD-;labs- cbc/cmp/PSA; Taxoetere chemo;  D-2 injection-Dr.B    All questions were answered. The patient knows to call the clinic with any problems, questions or concerns.    Gwyn Leos, MD 08/08/2023 12:10 PM

## 2023-08-08 NOTE — Patient Instructions (Signed)
 [facility] - A DEPT OF [facility]  Discharge Instructions: Thank you for choosing [facility] to provide your oncology and hematology care.  If you have a lab appointment with the Cancer Center, please go directly to the Cancer Center and check in at the registration area.  Wear comfortable clothing and clothing appropriate for easy access to any Portacath or PICC line.   We strive to give you quality time with your provider. You may need to reschedule your appointment if you arrive late (15 or more minutes).  Arriving late affects you and other patients whose appointments are after yours.  Also, if you miss three or more appointments without notifying the office, you may be dismissed from the clinic at the provider's discretion.      For prescription refill requests, have your pharmacy contact our office and allow 72 hours for refills to be completed.    Today you received the following chemotherapy and/or immunotherapy agents: DOCEtaxel     To help prevent nausea and vomiting after your treatment, we encourage you to take your nausea medication as directed.  BELOW ARE SYMPTOMS THAT SHOULD BE REPORTED IMMEDIATELY: *FEVER GREATER THAN 100.4 F (38 C) OR HIGHER *CHILLS OR SWEATING *NAUSEA AND VOMITING THAT IS NOT CONTROLLED WITH YOUR NAUSEA MEDICATION *UNUSUAL SHORTNESS OF BREATH *UNUSUAL BRUISING OR BLEEDING *URINARY PROBLEMS (pain or burning when urinating, or frequent urination) *BOWEL PROBLEMS (unusual diarrhea, constipation, pain near the anus) TENDERNESS IN MOUTH AND THROAT WITH OR WITHOUT PRESENCE OF ULCERS (sore throat, sores in mouth, or a toothache) UNUSUAL RASH, SWELLING OR PAIN  UNUSUAL VAGINAL DISCHARGE OR ITCHING   Items with * indicate a potential emergency and should be followed up as soon as possible or go to the Emergency Department if any problems should occur.  Please show the CHEMOTHERAPY ALERT CARD or IMMUNOTHERAPY  ALERT CARD at check-in to the Emergency Department and triage nurse.  Should you have questions after your visit or need to cancel or reschedule your appointment, please contact [facility] - A DEPT OF [facility]  [phone] and follow the prompts.  Office hours are 8:00 a.m. to 4:30 p.m. Monday - Friday. Please note that voicemails left after 4:00 p.m. may not be returned until the following business day.  We are closed weekends and major holidays. You have access to a nurse at all times for urgent questions. Please call the main number to the clinic [phone] and follow the prompts.  For any non-urgent questions, you may also contact your provider using MyChart. We now offer e-Visits for anyone 68 and older to request care online for non-urgent symptoms. For details visit [email or web address].   Also download the MyChart app! Go to the app store, search "MyChart", open the app, select [facility], and log in with your MyChart username and password.

## 2023-08-08 NOTE — Progress Notes (Signed)
NO concerns today.

## 2023-08-09 ENCOUNTER — Encounter: Payer: Self-pay | Admitting: Internal Medicine

## 2023-08-09 ENCOUNTER — Inpatient Hospital Stay

## 2023-08-09 ENCOUNTER — Other Ambulatory Visit: Payer: Self-pay

## 2023-08-09 DIAGNOSIS — C61 Malignant neoplasm of prostate: Secondary | ICD-10-CM

## 2023-08-09 DIAGNOSIS — Z5111 Encounter for antineoplastic chemotherapy: Secondary | ICD-10-CM | POA: Diagnosis not present

## 2023-08-09 MED ORDER — PEGFILGRASTIM-CBQV 6 MG/0.6ML ~~LOC~~ SOSY
6.0000 mg | PREFILLED_SYRINGE | Freq: Once | SUBCUTANEOUS | Status: AC
  Administered 2023-08-09: 6 mg via SUBCUTANEOUS
  Filled 2023-08-09: qty 0.6

## 2023-08-22 ENCOUNTER — Encounter: Payer: Self-pay | Admitting: Internal Medicine

## 2023-08-29 ENCOUNTER — Encounter: Payer: Self-pay | Admitting: Internal Medicine

## 2023-08-29 ENCOUNTER — Inpatient Hospital Stay

## 2023-08-29 ENCOUNTER — Inpatient Hospital Stay (HOSPITAL_BASED_OUTPATIENT_CLINIC_OR_DEPARTMENT_OTHER): Admitting: Internal Medicine

## 2023-08-29 VITALS — BP 172/83 | HR 70

## 2023-08-29 VITALS — BP 121/57 | HR 72 | Temp 98.0°F | Resp 20 | Ht 72.0 in | Wt 207.5 lb

## 2023-08-29 DIAGNOSIS — Z5111 Encounter for antineoplastic chemotherapy: Secondary | ICD-10-CM | POA: Diagnosis not present

## 2023-08-29 DIAGNOSIS — C61 Malignant neoplasm of prostate: Secondary | ICD-10-CM | POA: Diagnosis not present

## 2023-08-29 LAB — CBC WITH DIFFERENTIAL (CANCER CENTER ONLY)
Abs Immature Granulocytes: 0.05 10*3/uL (ref 0.00–0.07)
Basophils Absolute: 0.1 10*3/uL (ref 0.0–0.1)
Basophils Relative: 1 %
Eosinophils Absolute: 0.1 10*3/uL (ref 0.0–0.5)
Eosinophils Relative: 1 %
HCT: 31.9 % — ABNORMAL LOW (ref 39.0–52.0)
Hemoglobin: 10 g/dL — ABNORMAL LOW (ref 13.0–17.0)
Immature Granulocytes: 0 %
Lymphocytes Relative: 32 %
Lymphs Abs: 4 10*3/uL (ref 0.7–4.0)
MCH: 27.2 pg (ref 26.0–34.0)
MCHC: 31.3 g/dL (ref 30.0–36.0)
MCV: 86.9 fL (ref 80.0–100.0)
Monocytes Absolute: 1.2 10*3/uL — ABNORMAL HIGH (ref 0.1–1.0)
Monocytes Relative: 10 %
Neutro Abs: 7.1 10*3/uL (ref 1.7–7.7)
Neutrophils Relative %: 56 %
Platelet Count: 322 10*3/uL (ref 150–400)
RBC: 3.67 MIL/uL — ABNORMAL LOW (ref 4.22–5.81)
RDW: 19.1 % — ABNORMAL HIGH (ref 11.5–15.5)
WBC Count: 12.6 10*3/uL — ABNORMAL HIGH (ref 4.0–10.5)
nRBC: 0 % (ref 0.0–0.2)

## 2023-08-29 LAB — CMP (CANCER CENTER ONLY)
ALT: 8 U/L (ref 0–44)
AST: 16 U/L (ref 15–41)
Albumin: 3.3 g/dL — ABNORMAL LOW (ref 3.5–5.0)
Alkaline Phosphatase: 93 U/L (ref 38–126)
Anion gap: 9 (ref 5–15)
BUN: 13 mg/dL (ref 8–23)
CO2: 21 mmol/L — ABNORMAL LOW (ref 22–32)
Calcium: 9.1 mg/dL (ref 8.9–10.3)
Chloride: 106 mmol/L (ref 98–111)
Creatinine: 0.97 mg/dL (ref 0.61–1.24)
GFR, Estimated: >60 mL/min (ref >60)
Glucose, Bld: 190 mg/dL — ABNORMAL HIGH (ref 70–99)
Potassium: 3.6 mmol/L (ref 3.5–5.1)
Sodium: 136 mmol/L (ref 135–145)
Total Bilirubin: 0.4 mg/dL (ref 0.0–1.2)
Total Protein: 6.3 g/dL — ABNORMAL LOW (ref 6.5–8.1)

## 2023-08-29 LAB — PSA: Prostatic Specific Antigen: 3.51 ng/mL (ref 0.00–4.00)

## 2023-08-29 MED ORDER — DEXAMETHASONE SODIUM PHOSPHATE 10 MG/ML IJ SOLN
10.0000 mg | Freq: Once | INTRAMUSCULAR | Status: AC
  Administered 2023-08-29: 10 mg via INTRAVENOUS
  Filled 2023-08-29: qty 1

## 2023-08-29 MED ORDER — HEPARIN SOD (PORK) LOCK FLUSH 100 UNIT/ML IV SOLN
500.0000 [IU] | Freq: Once | INTRAVENOUS | Status: AC | PRN
  Administered 2023-08-29: 500 [IU]
  Filled 2023-08-29: qty 5

## 2023-08-29 MED ORDER — SODIUM CHLORIDE 0.9 % IV SOLN
60.0000 mg/m2 | Freq: Once | INTRAVENOUS | Status: AC
  Administered 2023-08-29: 134 mg via INTRAVENOUS
  Filled 2023-08-29: qty 13.4

## 2023-08-29 MED ORDER — SODIUM CHLORIDE 0.9 % IV SOLN
INTRAVENOUS | Status: DC
Start: 2023-08-29 — End: 2023-08-29
  Filled 2023-08-29: qty 250

## 2023-08-29 NOTE — Patient Instructions (Signed)
 STOP  telmisartan  .as per your [facility] doctor.

## 2023-08-29 NOTE — Progress Notes (Signed)
 Pt states telmisartan or lasix  was put on hold by a [facility] doctor 08/26/23, can't remember which one.

## 2023-08-29 NOTE — Assessment & Plan Note (Addendum)
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 NGS-F-ONE 2022- NEGATIVE for any targets. # MARCH 2025- PET scan shows progression-although Lesions are subcentimeter in size; also PSA is rising. APRIL 2025-Taxotere  chemotherapy-every 3 weeks x 6 cycles.  # Proceed with Taxotere   cycle #4 ; Labs-CBC/chemistries were reviewed with the patient. growth factor on day 2 [ reduce to 60 mg/m]. PSA-over all stable.  Will plan repeating scans after 6 cycles.   # HTN- Discussed stopping his telmisartan to prevent hypotension. [as per [facility]]- stable.   # Hot flashes- G-1sec to ADT-stable.  # RUL nodule- ~2-3cm; cystic [on PET AUG 2022]-  No new dominant mass, fluid collection or lymph node enlargement in the thorax. on most recent PET scan in OCT 2024.  Will monitor on chemotherapy-stable.    # Chest tenderness-grade 1-2. Sec to ADT- continue tramadol  prn-refiled; add vaseline BID- stable.  # DM-on metformin-oral hypoglycemic drugs- monitor closley on chemo/steroids- RBG- 190 will send a script for glucometer- check 2-3 times a day-recommend compliance  with BG checks.   # Weight loss: # refer to nutrition/Joli re: weight loss  # IV access:s/p  Mediport placement.  # ACP: FULL code.   *eliagrd 45 -08/08/2023- PSA- poor surrogate.   PS # DISPOSITION:   # chemo today;  # Follow up in 3   weeks-- MD-;labs- cbc/cmp/PSA; Taxoetere chemo;  D-2 injection # Follow up in 6 weeks-- MD-;labs- cbc/cmp/PSA; Taxoetere chemo;  D-2 injection-Dr.B

## 2023-08-29 NOTE — Progress Notes (Signed)
 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   # NOV 2021- CTA [ER] Diffuse bilateral pulmonary nodules including a cavitary lesion in the right upper lobe measuring approximately 2 cm. There is extensive nodular pleural thickening bilaterally, greatest in the left lower lobe.  November 2021 PET scan-multiple lung lesions multiple bone lesions and prostate uptake.  February 2022-prostate biopsy [Dr.Brandon]   # DM-2; NO COPD [?]; ACTIVE SMOKER 65ppd.  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 No pathogenic variants identified on the [facility] Multi-Cancer Panel. VUS in AXIN2 called c.1235A>C identified. The report date is 03/23/2020.   The Multi-Cancer Panel offered by [facility]  includes sequencing and/or deletion duplication testing of the following 84 genes: AIP, ALK, APC, ATM, AXIN2,BAP1,  BARD1, BLM, BMPR1A, BRCA1, BRCA2, BRIP1, CASR, CDC73, CDH1, CDK4, CDKN1B, CDKN1C, CDKN2A (p14ARF), CDKN2A (p16INK4a), CEBPA, CHEK2, CTNNA1, DICER1, DIS3L2, EGFR (c.2369C>T, p.Thr790Met variant only), EPCAM (Deletion/duplication testing only), FH, FLCN, GATA2, GPC3, GREM1 (Promoter region deletion/duplication testing only), HOXB13 (c.251G>A, p.Gly84Glu), HRAS, KIT, MAX, MEN1, MET, MITF (c.952G>A, p.Glu318Lys variant only), MLH1, MSH2, MSH3, MSH6, MUTYH, NBN, NF1, NF2, NTHL1, PALB2, PDGFRA, PHOX2B, PMS2, POLD1, POLE, POT1, PRKAR1A, PTCH1, PTEN, RAD50, RAD51C, RAD51D, RB1, RECQL4, RET, RUNX1, SDHAF2, SDHA (sequence changes only), SDHB, SDHC, SDHD, SMAD4, SMARCA4, SMARCB1, SMARCE1, STK11, SUFU, TERC, TERT, TMEM127, TP53, TSC1, TSC2, VHL, WRN and WT1.    06/28/2023 -  Chemotherapy   Patient is on Treatment Plan : PROSTATE Docetaxel  (75) + Prednisone  q21d       HISTORY OF PRESENTING ILLNESS: Ambulating independently. Alone,   Brent Glass 83 y.o.  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  Patient is doing well. But has lost weight. Positive for hair loss.   Pt states telmisartan or lasix  was put on hold by a [facility] doctor 08/26/23, can't remember which one.    No falls.  No fever no chills.  No constipation no diarrhea.  No nausea no vomiting. Denies any worsening pain. No tingling or numbness.   Review of Systems  Constitutional:  Positive for malaise/fatigue. Negative for chills, diaphoresis and fever.  HENT:  Negative for nosebleeds and sore throat.   Eyes:  Negative for double vision.  Respiratory:  Negative for hemoptysis, sputum production, shortness of breath and wheezing.   Cardiovascular:  Negative for chest pain, palpitations, orthopnea and leg swelling.  Gastrointestinal:  Negative  for blood in stool, constipation, diarrhea, heartburn, melena, nausea and vomiting.  Genitourinary:  Negative for dysuria, frequency and urgency.  Musculoskeletal:  Positive for back pain and myalgias. Negative for joint pain.  Skin: Negative.  Negative for itching and rash.  Neurological:  Negative for dizziness, tingling, focal weakness, weakness and headaches.  Endo/Heme/Allergies:  Does not bruise/bleed easily.  Psychiatric/Behavioral:  Negative for depression. The patient is not nervous/anxious and does not have insomnia.      MEDICAL HISTORY:  Past Medical History:  Diagnosis Date   Arthritis    BPH (benign prostatic hyperplasia)    BPH with obstruction/lower urinary tract symptoms    Diabetes (HCC)    Disorder resulting from impaired renal function    Elevated PSA    Family history of lung cancer    Family history of lymphoma    Heart murmur    Hypertension    Knee pain    Lesion of lung    Low HDL (under 40)    Obesity    Urinary hesitancy     SURGICAL HISTORY: Past Surgical History:  Procedure Laterality Date   HERNIA REPAIR  2012   Umbilical Hernia Repair   IR IMAGING GUIDED PORT INSERTION  06/21/2023   KNEE ARTHROSCOPY Left 04/10/2015   Procedure: ARTHROSCOPY KNEE, partial medial meniscectomy, partial synovectomy;  Surgeon: Ozell Flake, MD;  Location: [facility] ORS;  Service: Orthopedics;  Laterality: Left;    SOCIAL HISTORY: Social History   Socioeconomic History   Marital status: Widowed    Spouse name: Not on file   Number of children: Not on file   Years of education: Not on file   Highest education level: Not on file  Occupational History   Not on file  Tobacco Use   Smoking status: Every Day    Current packs/day: 1.00    Types: Cigarettes   Smokeless tobacco: Never  Vaping Use   Vaping status: Never Used  Substance and Sexual Activity   Alcohol use: No    Alcohol/week: 0.0 standard drinks of alcohol   Drug use: No   Sexual activity: Not Currently   Other Topics Concern   Not on file  Social History Narrative   Lives in [place]; self; daughter lives 1 mile. Smoke 1ppd > 65 years; stopped 25 years ago.  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    Social Drivers of Corporate investment banker Strain: Not on file  Food Insecurity: No Food Insecurity (07/27/2023)   Received from [facility]   Hunger Vital Sign    Within the past 12 months, you worried that your food would run out before you got the money to buy more.: Never true    Within the past 12 months, the food you bought just didn't last and you didn't have money to get more.: Never true  Transportation Needs: No Transportation Needs (07/27/2023)   Received from [facility] - Transportation    Lack of Transportation (Medical): No    Lack of Transportation (Non-Medical): No  Physical Activity: Not on file  Stress: Not on file  Social Connections: Moderately Integrated (04/08/2023)   Social Connection and Isolation Panel    Frequency of Communication with Friends and Family: More than three times a week    Frequency of Social  Gatherings with Friends and Family: More than three times a week    Attends Religious Services: 1 to 4 times per year    Active Member of Clubs or Organizations: Yes    Attends Banker Meetings: 1 to 4 times per year    Marital Status: Widowed  Intimate Partner Violence: Not At Risk (04/08/2023)   Humiliation, Afraid, Rape, and Kick questionnaire    Fear of Current or Ex-Partner: No    Emotionally Abused: No    Physically Abused: No    Sexually Abused: No    FAMILY HISTORY: Family History  Problem Relation Age of Onset   Diabetes Mother    Lung cancer Mother    Lymphoma Son 28       died in 87.    Kidney disease Neg Hx    Prostate cancer Neg Hx     ALLERGIES:  has no known allergies.  MEDICATIONS:  Current Outpatient Medications  Medication Sig Dispense Refill   acetaminophen   (TYLENOL ) 650 MG CR tablet Take 650 mg by mouth every 8 (eight) hours as needed for pain.     Blood Glucose Monitoring Suppl DEVI 1 each by Does not apply route in the morning, at noon, and at bedtime. May substitute to any manufacturer covered by patient's insurance. 1 each 0   clopidogrel  (PLAVIX ) 75 MG tablet Take 1 tablet (75 mg total) by mouth daily. 21 tablet 0   cyanocobalamin  2000 MCG tablet Take 1 tablet (2,000 mcg total) by mouth daily. 90 tablet 1   finasteride  (PROSCAR ) 5 MG tablet TAKE 1 TABLET BY MOUTH EVERY DAY 90 tablet 3   Glucose Blood (BLOOD GLUCOSE TEST STRIPS) STRP 1 each by In Vitro route in the morning, at noon, and at bedtime. May substitute to any manufacturer covered by patient's insurance. 100 strip 3   GLUMETZA 1000 MG 24 hr tablet Take 1,000 mg by mouth 2 (two) times daily.     Lancets (FREESTYLE) lancets Use 2-3 times a day or as instructed 100 each 12   lidocaine -prilocaine  (EMLA ) cream Apply 1 Application topically as needed. Apply to port and cover with saran wrap 1-2 hours prior to port access 30 g 1   metFORMIN (GLUCOPHAGE) 1000 MG tablet Take 1,000 mg by mouth 2 (two) times daily with a meal.     ondansetron  (ZOFRAN ) 8 MG tablet Take 1 tablet (8 mg total) by mouth every 8 (eight) hours as needed for nausea or vomiting. 20 tablet 0   prochlorperazine  (COMPAZINE ) 10 MG tablet Take 1 tablet (10 mg total) by mouth every 6 (six) hours as needed for nausea or vomiting. 30 tablet 2   rosuvastatin  (CRESTOR ) 20 MG tablet Take 2 tablets (40 mg total) by mouth at bedtime. 60 tablet 6   thiamine  100 MG tablet TAKE 1 TABLET BY MOUTH EVERY DAY 90 tablet 1   furosemide  (LASIX ) 20 MG tablet Take 1 tablet (20 mg total) by mouth daily. 15 tablet 0   telmisartan (MICARDIS) 20 MG tablet Take 1 tablet by mouth daily.     traMADol  (ULTRAM ) 50 MG tablet Take 1 tablet (50 mg total) by mouth daily as needed. (Patient not taking: Reported on 08/29/2023) 60 tablet 0   No current  facility-administered medications for this visit.   Facility-Administered Medications Ordered in Other Visits  Medication Dose Route Frequency Provider Last Rate Last Admin   0.9 %  sodium chloride  infusion   Intravenous Continuous Darline Faith R, MD   Stopped  at 08/29/23 1158      .  PHYSICAL EXAMINATION: ECOG PERFORMANCE STATUS: 0 - Asymptomatic  Vitals:   08/29/23 0855  BP: (!) 121/57  Pulse: 72  Resp: 20  Temp: 98 F (36.7 C)  SpO2: 100%        Filed Weights   08/29/23 0855  Weight: 207 lb 8 oz (94.1 kg)        Physical Exam HENT:     Head: Normocephalic and atraumatic.     Mouth/Throat:     Pharynx: No oropharyngeal exudate.   Eyes:     Pupils: Pupils are equal, round, and reactive to light.    Cardiovascular:     Rate and Rhythm: Normal rate and regular rhythm.  Pulmonary:     Effort: No respiratory distress.     Breath sounds: No wheezing.     Comments: Decreased breath sounds bilaterally. Abdominal:     General: Bowel sounds are normal. There is no distension.     Palpations: Abdomen is soft. There is no mass.     Tenderness: There is no abdominal tenderness. There is no guarding or rebound.   Musculoskeletal:        General: No tenderness. Normal range of motion.     Cervical back: Normal range of motion and neck supple.   Skin:    General: Skin is warm.   Neurological:     Mental Status: He is alert and oriented to person, place, and time.   Psychiatric:        Mood and Affect: Affect normal.      LABORATORY DATA:  I have reviewed the data as listed Lab Results  Component Value Date   WBC 12.6 (H) 08/29/2023   HGB 10.0 (L) 08/29/2023   HCT 31.9 (L) 08/29/2023   MCV 86.9 08/29/2023   PLT 322 08/29/2023   Recent Labs    07/19/23 0832 08/08/23 1002 08/29/23 0857  NA 138 138 136  K 3.9 3.6 3.6  CL 105 106 106  CO2 24 22 21*  GLUCOSE 170* 194* 190*  BUN 15 16 13   CREATININE 1.07 0.95 0.97  CALCIUM  9.1 8.8* 9.1   GFRNONAA >60 >60 >60  PROT 6.8 6.8 6.3*  ALBUMIN 3.1* 3.5 3.3*  AST 14* 14* 16  ALT 7 7 8   ALKPHOS 108 101 93  BILITOT 0.3 0.5 0.4    Latest Reference Range & Units 01/23/20 12:22 03/10/20 14:11 03/19/20 09:19 04/16/20 09:10 05/14/20 10:06 06/13/20 09:26 07/11/20 10:13 08/07/20 09:24 09/04/20 10:13 09/16/20 13:59 10/21/20 13:59 12/23/20 13:27  Prostatic Specific Antigen 0.00 - 4.00 ng/mL 986.00 (H) 34.69 (H) 21.59 (H) 2.68 1.21 0.71 0.68 0.41 0.36 0.28 0.29 0.28  (H): Data is abnormally high  RADIOGRAPHIC STUDIES: I have personally reviewed the radiological images as listed and agreed with the findings in the report. No results found.   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 NGS-F-ONE 2022- NEGATIVE for any targets. # MARCH 2025- PET scan shows progression-although Lesions are subcentimeter in size; also PSA is rising. APRIL 2025-Taxotere  chemotherapy-every 3 weeks x 6 cycles.  # Proceed with Taxotere   cycle #4 ; Labs-CBC/chemistries were reviewed with the patient. growth factor on day 2 [ reduce to 60 mg/m]. PSA-over all stable.  Will plan repeating scans after 6 cycles.   # HTN- Discussed stopping his telmisartan to prevent hypotension. sherrilyn  per [facility]]- stable.   # Hot flashes- G-1sec to ADT-stable.  # RUL nodule- ~2-3cm; cystic [on PET AUG 2022]-  No new dominant mass, fluid collection or lymph node enlargement in the thorax. on most recent PET scan in OCT 2024.  Will monitor on chemotherapy-stable.    # Chest tenderness-grade 1-2. Sec to ADT- continue tramadol  prn-refiled; add vaseline BID- stable.  # DM-on metformin-oral hypoglycemic drugs- monitor closley on chemo/steroids- RBG- 190 will send a script for glucometer- check 2-3 times a day-recommend compliance  with BG checks.   # Weight loss: # refer to nutrition/Joli re: weight loss  # IV  access:s/p  Mediport placement.  # ACP: FULL code.   *eliagrd 44 -08/08/2023- PSA- poor surrogate.   PS # DISPOSITION:   # chemo today;  # Follow up in 3   weeks-- MD-;labs- cbc/cmp/PSA; Taxoetere chemo;  D-2 injection # Follow up in 6 weeks-- MD-;labs- cbc/cmp/PSA; Taxoetere chemo;  D-2 injection-Dr.B    All questions were answered. The patient knows to call the clinic with any problems, questions or concerns.    Cindy JONELLE Joe, MD 08/29/2023 1:07 PM

## 2023-08-30 ENCOUNTER — Inpatient Hospital Stay

## 2023-08-30 ENCOUNTER — Other Ambulatory Visit: Payer: Self-pay

## 2023-08-30 DIAGNOSIS — Z5111 Encounter for antineoplastic chemotherapy: Secondary | ICD-10-CM | POA: Diagnosis not present

## 2023-08-30 DIAGNOSIS — C61 Malignant neoplasm of prostate: Secondary | ICD-10-CM

## 2023-08-30 MED ORDER — PEGFILGRASTIM-CBQV 6 MG/0.6ML ~~LOC~~ SOSY
6.0000 mg | PREFILLED_SYRINGE | Freq: Once | SUBCUTANEOUS | Status: AC
  Administered 2023-08-30: 6 mg via SUBCUTANEOUS

## 2023-09-19 ENCOUNTER — Encounter: Payer: Self-pay | Admitting: Internal Medicine

## 2023-09-19 ENCOUNTER — Inpatient Hospital Stay (HOSPITAL_BASED_OUTPATIENT_CLINIC_OR_DEPARTMENT_OTHER): Admitting: Internal Medicine

## 2023-09-19 ENCOUNTER — Inpatient Hospital Stay: Attending: Internal Medicine

## 2023-09-19 ENCOUNTER — Inpatient Hospital Stay

## 2023-09-19 VITALS — BP 184/87 | HR 69 | Temp 96.0°F | Resp 19

## 2023-09-19 VITALS — BP 121/49 | HR 74 | Temp 96.8°F | Resp 18 | Ht 72.0 in | Wt 209.2 lb

## 2023-09-19 DIAGNOSIS — C61 Malignant neoplasm of prostate: Secondary | ICD-10-CM | POA: Insufficient documentation

## 2023-09-19 DIAGNOSIS — C7951 Secondary malignant neoplasm of bone: Secondary | ICD-10-CM | POA: Diagnosis present

## 2023-09-19 DIAGNOSIS — F17210 Nicotine dependence, cigarettes, uncomplicated: Secondary | ICD-10-CM | POA: Diagnosis not present

## 2023-09-19 DIAGNOSIS — Z5111 Encounter for antineoplastic chemotherapy: Secondary | ICD-10-CM | POA: Insufficient documentation

## 2023-09-19 DIAGNOSIS — C7800 Secondary malignant neoplasm of unspecified lung: Secondary | ICD-10-CM | POA: Insufficient documentation

## 2023-09-19 LAB — CMP (CANCER CENTER ONLY)
ALT: 8 U/L (ref 0–44)
AST: 15 U/L (ref 15–41)
Albumin: 3.5 g/dL (ref 3.5–5.0)
Alkaline Phosphatase: 88 U/L (ref 38–126)
Anion gap: 4 — ABNORMAL LOW (ref 5–15)
BUN: 13 mg/dL (ref 8–23)
CO2: 23 mmol/L (ref 22–32)
Calcium: 8.9 mg/dL (ref 8.9–10.3)
Chloride: 110 mmol/L (ref 98–111)
Creatinine: 0.98 mg/dL (ref 0.61–1.24)
GFR, Estimated: >60 mL/min (ref >60)
Glucose, Bld: 163 mg/dL — ABNORMAL HIGH (ref 70–99)
Potassium: 3.7 mmol/L (ref 3.5–5.1)
Sodium: 137 mmol/L (ref 135–145)
Total Bilirubin: 0.6 mg/dL (ref 0.0–1.2)
Total Protein: 6.3 g/dL — ABNORMAL LOW (ref 6.5–8.1)

## 2023-09-19 LAB — CBC WITH DIFFERENTIAL (CANCER CENTER ONLY)
Abs Immature Granulocytes: 0.03 K/uL (ref 0.00–0.07)
Basophils Absolute: 0.1 K/uL (ref 0.0–0.1)
Basophils Relative: 1 %
Eosinophils Absolute: 0.1 K/uL (ref 0.0–0.5)
Eosinophils Relative: 1 %
HCT: 32.5 % — ABNORMAL LOW (ref 39.0–52.0)
Hemoglobin: 10.1 g/dL — ABNORMAL LOW (ref 13.0–17.0)
Immature Granulocytes: 0 %
Lymphocytes Relative: 34 %
Lymphs Abs: 3.8 K/uL (ref 0.7–4.0)
MCH: 27.7 pg (ref 26.0–34.0)
MCHC: 31.1 g/dL (ref 30.0–36.0)
MCV: 89 fL (ref 80.0–100.0)
Monocytes Absolute: 1.2 K/uL — ABNORMAL HIGH (ref 0.1–1.0)
Monocytes Relative: 11 %
Neutro Abs: 5.9 K/uL (ref 1.7–7.7)
Neutrophils Relative %: 53 %
Platelet Count: 324 K/uL (ref 150–400)
RBC: 3.65 MIL/uL — ABNORMAL LOW (ref 4.22–5.81)
RDW: 20.2 % — ABNORMAL HIGH (ref 11.5–15.5)
WBC Count: 11.1 K/uL — ABNORMAL HIGH (ref 4.0–10.5)
nRBC: 0 % (ref 0.0–0.2)

## 2023-09-19 LAB — PSA: Prostatic Specific Antigen: 3.63 ng/mL (ref 0.00–4.00)

## 2023-09-19 MED ORDER — DEXAMETHASONE SODIUM PHOSPHATE 10 MG/ML IJ SOLN
10.0000 mg | Freq: Once | INTRAMUSCULAR | Status: AC
  Administered 2023-09-19: 10 mg via INTRAVENOUS
  Filled 2023-09-19: qty 1

## 2023-09-19 MED ORDER — SODIUM CHLORIDE 0.9 % IV SOLN
INTRAVENOUS | Status: DC
  Filled 2023-09-19: qty 250

## 2023-09-19 MED ORDER — SODIUM CHLORIDE 0.9 % IV SOLN
60.0000 mg/m2 | Freq: Once | INTRAVENOUS | Status: AC
  Administered 2023-09-19: 134 mg via INTRAVENOUS
  Filled 2023-09-19: qty 13.4

## 2023-09-19 MED ORDER — HEPARIN SOD (PORK) LOCK FLUSH 100 UNIT/ML IV SOLN
500.0000 [IU] | Freq: Once | INTRAVENOUS | Status: AC | PRN
  Administered 2023-09-19: 500 [IU]
  Filled 2023-09-19: qty 5

## 2023-09-19 NOTE — Progress Notes (Signed)
 Post infusion and after pt had ambulated to bathroom, bp 201/87. After waiting a few minutes, bp remains elevated at 184/87. Rennie, MD notified. Per Rennie, MD ok to discharge pt to home since he remains asymptomatic. Pt educated to monitor bp at home and to seek emergency care if he becomes symptomatic of hypertension. Pt verified understanding. Pt stable at discharge.

## 2023-09-19 NOTE — Assessment & Plan Note (Signed)
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 NGS-F-ONE 2022- NEGATIVE for any targets. # MARCH 2025- PET scan shows progression-although Lesions are subcentimeter in size; also PSA is rising. APRIL 2025-Taxotere  chemotherapy-every 3 weeks x 6 cycles.  # Proceed with Taxotere   cycle #5 ; Labs-CBC/chemistries were reviewed with the patient. growth factor on day 2 [ reduce to 60 mg/m]. PSA-over all stable.  Will plan repeating scans after 6 cycles.   # HTN- OFF telmisartan   [as per [facility]]- stable.   # Hot flashes- G-1sec to ADT-stable.   # RUL nodule- ~2-3cm; cystic [on PET AUG 2022]-  No new dominant mass, fluid collection or lymph node enlargement in the thorax. on most recent PET scan in OCT 2024.  Will monitor on chemotherapy-stable.     # Chest tenderness-grade 1-2. Sec to ADT- continue tramadol  prn-refiled; add vaseline BID- stable.  # DM-on metformin-oral hypoglycemic drugs- monitor closley on chemo/steroids- RBG- 190 will send a script for glucometer- check 2-3 times a day-recommend compliance  with BG checks.   # Weight loss: # refer to nutrition/Joli re: weight loss  # IV access:s/p  Mediport placement.  # ACP: FULL code.   *eliagrd 44 -08/08/2023- PSA- poor surrogate.   PS # DISPOSITION:   # chemo today;  # Follow up in 3   weeks-- MD-;labs- cbc/cmp/PSA; Taxoetere chemo;  D-2 injection-Dr.B

## 2023-09-19 NOTE — Patient Instructions (Signed)
 [facility] - A DEPT OF [facility]  Discharge Instructions: Thank you for choosing [facility] to provide your oncology and hematology care.  If you have a lab appointment with the Cancer Center, please go directly to the Cancer Center and check in at the registration area.  Wear comfortable clothing and clothing appropriate for easy access to any Portacath or PICC line.   We strive to give you quality time with your provider. You may need to reschedule your appointment if you arrive late (15 or more minutes).  Arriving late affects you and other patients whose appointments are after yours.  Also, if you miss three or more appointments without notifying the office, you may be dismissed from the clinic at the provider's discretion.      For prescription refill requests, have your pharmacy contact our office and allow 72 hours for refills to be completed.    Today you received the following chemotherapy and/or immunotherapy agents docetaxel       To help prevent nausea and vomiting after your treatment, we encourage you to take your nausea medication as directed.  BELOW ARE SYMPTOMS THAT SHOULD BE REPORTED IMMEDIATELY: *FEVER GREATER THAN 100.4 F (38 C) OR HIGHER *CHILLS OR SWEATING *NAUSEA AND VOMITING THAT IS NOT CONTROLLED WITH YOUR NAUSEA MEDICATION *UNUSUAL SHORTNESS OF BREATH *UNUSUAL BRUISING OR BLEEDING *URINARY PROBLEMS (pain or burning when urinating, or frequent urination) *BOWEL PROBLEMS (unusual diarrhea, constipation, pain near the anus) TENDERNESS IN MOUTH AND THROAT WITH OR WITHOUT PRESENCE OF ULCERS (sore throat, sores in mouth, or a toothache) UNUSUAL RASH, SWELLING OR PAIN  UNUSUAL VAGINAL DISCHARGE OR ITCHING   Items with * indicate a potential emergency and should be followed up as soon as possible or go to the Emergency Department if any problems should occur.  Please show the CHEMOTHERAPY ALERT CARD or IMMUNOTHERAPY  ALERT CARD at check-in to the Emergency Department and triage nurse.  Should you have questions after your visit or need to cancel or reschedule your appointment, please contact [facility] - A DEPT OF [facility]  [phone] and follow the prompts.  Office hours are 8:00 a.m. to 4:30 p.m. Monday - Friday. Please note that voicemails left after 4:00 p.m. may not be returned until the following business day.  We are closed weekends and major holidays. You have access to a nurse at all times for urgent questions. Please call the main number to the clinic [phone] and follow the prompts.  For any non-urgent questions, you may also contact your provider using MyChart. We now offer e-Visits for anyone 55 and older to request care online for non-urgent symptoms. For details visit [email or web address].   Also download the MyChart app! Go to the app store, search MyChart, open the app, select [place], and log in with your MyChart username and password.

## 2023-09-19 NOTE — Progress Notes (Signed)
 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   # NOV 2021- CTA [ER] Diffuse bilateral pulmonary nodules including a cavitary lesion in the right upper lobe measuring approximately 2 cm. There is extensive nodular pleural thickening bilaterally, greatest in the left lower lobe.  November 2021 PET scan-multiple lung lesions multiple bone lesions and prostate uptake.  February 2022-prostate biopsy [Dr.Brandon]   # DM-2; NO COPD [?]; ACTIVE SMOKER 65ppd.  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 No pathogenic variants identified on the [facility] Multi-Cancer Panel. VUS in AXIN2 called c.1235A>C identified. The report date is 03/23/2020.   The Multi-Cancer Panel offered by [facility]  includes sequencing and/or deletion duplication testing of the following 84 genes: AIP, ALK, APC, ATM, AXIN2,BAP1,  BARD1, BLM, BMPR1A, BRCA1, BRCA2, BRIP1, CASR, CDC73, CDH1, CDK4, CDKN1B, CDKN1C, CDKN2A (p14ARF), CDKN2A (p16INK4a), CEBPA, CHEK2, CTNNA1, DICER1, DIS3L2, EGFR (c.2369C>T, p.Thr790Met variant only), EPCAM (Deletion/duplication testing only), FH, FLCN, GATA2, GPC3, GREM1 (Promoter region deletion/duplication testing only), HOXB13 (c.251G>A, p.Gly84Glu), HRAS, KIT, MAX, MEN1, MET, MITF (c.952G>A, p.Glu318Lys variant only), MLH1, MSH2, MSH3, MSH6, MUTYH, NBN, NF1, NF2, NTHL1, PALB2, PDGFRA, PHOX2B, PMS2, POLD1, POLE, POT1, PRKAR1A, PTCH1, PTEN, RAD50, RAD51C, RAD51D, RB1, RECQL4, RET, RUNX1, SDHAF2, SDHA (sequence changes only), SDHB, SDHC, SDHD, SMAD4, SMARCA4, SMARCB1, SMARCE1, STK11, SUFU, TERC, TERT, TMEM127, TP53, TSC1, TSC2, VHL, WRN and WT1.    06/28/2023 -  Chemotherapy   Patient is on Treatment Plan : PROSTATE Docetaxel  (75) + Prednisone  q21d       HISTORY OF PRESENTING ILLNESS: Ambulating independently. Alone,   Brent Glass 83 y.o.  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  Patient is doing well. But has lost weight. Positive for hair loss.   No falls.  No fever no chills.  No constipation no diarrhea.  No nausea no vomiting. Denies any worsening pain. No tingling or numbness.   Review of Systems  Constitutional:  Positive for malaise/fatigue. Negative for chills, diaphoresis and fever.  HENT:  Negative for nosebleeds and sore throat.   Eyes:  Negative for double vision.  Respiratory:  Negative for hemoptysis, sputum production, shortness of breath and wheezing.   Cardiovascular:  Negative for chest pain, palpitations, orthopnea  and leg swelling.  Gastrointestinal:  Negative for blood in stool, constipation, diarrhea, heartburn, melena, nausea and vomiting.  Genitourinary:   Negative for dysuria, frequency and urgency.  Musculoskeletal:  Positive for back pain and myalgias. Negative for joint pain.  Skin: Negative.  Negative for itching and rash.  Neurological:  Negative for dizziness, tingling, focal weakness, weakness and headaches.  Endo/Heme/Allergies:  Does not bruise/bleed easily.  Psychiatric/Behavioral:  Negative for depression. The patient is not nervous/anxious and does not have insomnia.      MEDICAL HISTORY:  Past Medical History:  Diagnosis Date   Arthritis    BPH (benign prostatic hyperplasia)    BPH with obstruction/lower urinary tract symptoms    Diabetes (HCC)    Disorder resulting from impaired renal function    Elevated PSA    Family history of lung cancer    Family history of lymphoma    Heart murmur    Hypertension    Knee pain    Lesion of lung    Low HDL (under 40)    Obesity    Urinary hesitancy     SURGICAL HISTORY: Past Surgical History:  Procedure Laterality Date   HERNIA REPAIR  2012   Umbilical Hernia Repair   IR IMAGING GUIDED PORT INSERTION  06/21/2023   KNEE ARTHROSCOPY Left 04/10/2015   Procedure: ARTHROSCOPY KNEE, partial medial meniscectomy, partial synovectomy;  Surgeon: Ozell Flake, MD;  Location: [facility] ORS;  Service: Orthopedics;  Laterality: Left;    SOCIAL HISTORY: Social History   Socioeconomic History   Marital status: Widowed    Spouse name: Not on file   Number of children: Not on file   Years of education: Not on file   Highest education level: Not on file  Occupational History   Not on file  Tobacco Use   Smoking status: Every Day    Current packs/day: 1.00    Types: Cigarettes   Smokeless tobacco: Never  Vaping Use   Vaping status: Never Used  Substance and Sexual Activity   Alcohol use: No    Alcohol/week: 0.0 standard drinks of alcohol   Drug use: No   Sexual activity: Not Currently  Other Topics Concern   Not on file  Social History Narrative   Lives in [place]; self;  daughter lives 1 mile. Smoke 1ppd > 65 years; stopped 25 years ago.  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    Social Drivers of Corporate investment banker Strain: Not on file  Food Insecurity: No Food Insecurity (07/27/2023)   Received from [facility]   Hunger Vital Sign    Within the past 12 months, you worried that your food would run out before you got the money to buy more.: Never true    Within the past 12 months, the food you bought just didn't last and you didn't have money to get more.: Never true  Transportation Needs: No Transportation Needs (07/27/2023)   Received from [facility] - Transportation    Lack of Transportation (Medical): No    Lack of Transportation (Non-Medical): No  Physical Activity: Not on file  Stress: Not on file  Social Connections: Moderately Integrated (04/08/2023)   Social Connection and Isolation Panel    Frequency of Communication with Friends and Family: More than three times a week    Frequency of Social Gatherings with Friends and Family: More than three times a week    Attends Religious Services: 1 to 4  times per year    Active Member of Clubs or Organizations: Yes    Attends Banker Meetings: 1 to 4 times per year    Marital Status: Widowed  Intimate Partner Violence: Not At Risk (04/08/2023)   Humiliation, Afraid, Rape, and Kick questionnaire    Fear of Current or Ex-Partner: No    Emotionally Abused: No    Physically Abused: No    Sexually Abused: No    FAMILY HISTORY: Family History  Problem Relation Age of Onset   Diabetes Mother    Lung cancer Mother    Lymphoma Son 28       died in 67.    Kidney disease Neg Hx    Prostate cancer Neg Hx     ALLERGIES:  has no known allergies.  MEDICATIONS:  Current Outpatient Medications  Medication Sig Dispense Refill   acetaminophen  (TYLENOL ) 650 MG CR tablet Take 650 mg by mouth every 8 (eight) hours as needed for pain.     Blood  Glucose Monitoring Suppl DEVI 1 each by Does not apply route in the morning, at noon, and at bedtime. May substitute to any manufacturer covered by patient's insurance. 1 each 0   clopidogrel  (PLAVIX ) 75 MG tablet Take 1 tablet (75 mg total) by mouth daily. 21 tablet 0   cyanocobalamin  2000 MCG tablet Take 1 tablet (2,000 mcg total) by mouth daily. 90 tablet 1   finasteride  (PROSCAR ) 5 MG tablet TAKE 1 TABLET BY MOUTH EVERY DAY 90 tablet 3   furosemide  (LASIX ) 20 MG tablet Take 1 tablet (20 mg total) by mouth daily. 15 tablet 0   Glucose Blood (BLOOD GLUCOSE TEST STRIPS) STRP 1 each by In Vitro route in the morning, at noon, and at bedtime. May substitute to any manufacturer covered by patient's insurance. 100 strip 3   GLUMETZA 1000 MG 24 hr tablet Take 1,000 mg by mouth 2 (two) times daily.     Lancets (FREESTYLE) lancets Use 2-3 times a day or as instructed 100 each 12   lidocaine -prilocaine  (EMLA ) cream Apply 1 Application topically as needed. Apply to port and cover with saran wrap 1-2 hours prior to port access 30 g 1   metFORMIN (GLUCOPHAGE) 1000 MG tablet Take 1,000 mg by mouth 2 (two) times daily with a meal.     ondansetron  (ZOFRAN ) 8 MG tablet Take 1 tablet (8 mg total) by mouth every 8 (eight) hours as needed for nausea or vomiting. 20 tablet 0   prochlorperazine  (COMPAZINE ) 10 MG tablet Take 1 tablet (10 mg total) by mouth every 6 (six) hours as needed for nausea or vomiting. 30 tablet 2   rosuvastatin  (CRESTOR ) 20 MG tablet Take 2 tablets (40 mg total) by mouth at bedtime. 60 tablet 6   telmisartan (MICARDIS) 20 MG tablet Take 1 tablet by mouth daily.     thiamine  100 MG tablet TAKE 1 TABLET BY MOUTH EVERY DAY 90 tablet 1   traMADol  (ULTRAM ) 50 MG tablet Take 1 tablet (50 mg total) by mouth daily as needed. (Patient not taking: Reported on 09/19/2023) 60 tablet 0   No current facility-administered medications for this visit.   Facility-Administered Medications Ordered in Other Visits   Medication Dose Route Frequency Provider Last Rate Last Admin   0.9 %  sodium chloride  infusion   Intravenous Continuous Syed Zukas R, MD       dexamethasone  (DECADRON ) injection 10 mg  10 mg Intravenous Once Hillary Schwegler R, MD  DOCEtaxel  (TAXOTERE ) 134 mg in sodium chloride  0.9 % 250 mL chemo infusion  60 mg/m2 (Treatment Plan Recorded) Intravenous Once Mallie Linnemann R, MD          .  PHYSICAL EXAMINATION: ECOG PERFORMANCE STATUS: 0 - Asymptomatic  Vitals:   09/19/23 0858  BP: (!) 121/49  Pulse: 74  Resp: 18  Temp: (!) 96.8 F (36 C)  SpO2: 100%        Filed Weights   09/19/23 0858  Weight: 209 lb 3.2 oz (94.9 kg)        Physical Exam HENT:     Head: Normocephalic and atraumatic.     Mouth/Throat:     Pharynx: No oropharyngeal exudate.  Eyes:     Pupils: Pupils are equal, round, and reactive to light.  Cardiovascular:     Rate and Rhythm: Normal rate and regular rhythm.  Pulmonary:     Effort: No respiratory distress.     Breath sounds: No wheezing.     Comments: Decreased breath sounds bilaterally. Abdominal:     General: Bowel sounds are normal. There is no distension.     Palpations: Abdomen is soft. There is no mass.     Tenderness: There is no abdominal tenderness. There is no guarding or rebound.  Musculoskeletal:        General: No tenderness. Normal range of motion.     Cervical back: Normal range of motion and neck supple.  Skin:    General: Skin is warm.  Neurological:     Mental Status: He is alert and oriented to person, place, and time.  Psychiatric:        Mood and Affect: Affect normal.      LABORATORY DATA:  I have reviewed the data as listed Lab Results  Component Value Date   WBC 11.1 (H) 09/19/2023   HGB 10.1 (L) 09/19/2023   HCT 32.5 (L) 09/19/2023   MCV 89.0 09/19/2023   PLT 324 09/19/2023   Recent Labs    08/08/23 1002 08/29/23 0857 09/19/23 0846  NA 138 136 137  K 3.6 3.6 3.7  CL 106  106 110  CO2 22 21* 23  GLUCOSE 194* 190* 163*  BUN 16 13 13   CREATININE 0.95 0.97 0.98  CALCIUM  8.8* 9.1 8.9  GFRNONAA >60 >60 >60  PROT 6.8 6.3* 6.3*  ALBUMIN 3.5 3.3* 3.5  AST 14* 16 15  ALT 7 8 8   ALKPHOS 101 93 88  BILITOT 0.5 0.4 0.6    Latest Reference Range & Units 01/23/20 12:22 03/10/20 14:11 03/19/20 09:19 04/16/20 09:10 05/14/20 10:06 06/13/20 09:26 07/11/20 10:13 08/07/20 09:24 09/04/20 10:13 09/16/20 13:59 10/21/20 13:59 12/23/20 13:27  Prostatic Specific Antigen 0.00 - 4.00 ng/mL 986.00 (H) 34.69 (H) 21.59 (H) 2.68 1.21 0.71 0.68 0.41 0.36 0.28 0.29 0.28  (H): Data is abnormally high  RADIOGRAPHIC STUDIES: I have personally reviewed the radiological images as listed and agreed with the findings in the report. No results found.   ASSESSMENT & PLAN:   Prostate cancer metastatic to multiple sites 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 NGS-F-ONE 2022- NEGATIVE for any targets. # MARCH 2025- PET scan shows progression-although Lesions are subcentimeter in size; also PSA is rising. APRIL 2025-Taxotere  chemotherapy-every 3 weeks x 6 cycles.  # Proceed with Taxotere   cycle #5 ; Labs-CBC/chemistries were reviewed with the patient. growth factor on day 2 [ reduce to 60 mg/m]. PSA-over all stable.  Will plan repeating scans after 6 cycles.   # HTN- OFF telmisartan   [as per [facility]]- stable.   # Hot flashes- G-1sec to ADT-stable.   # RUL nodule- ~2-3cm; cystic [on PET AUG 2022]-  No new dominant mass, fluid collection or lymph node enlargement in the thorax. on most recent PET scan in OCT 2024.  Will monitor on chemotherapy-stable.     # Chest tenderness-grade 1-2. Sec to ADT- continue tramadol  prn-refiled; add vaseline BID- stable.  # DM-on metformin-oral hypoglycemic drugs- monitor closley on chemo/steroids- RBG- 190 will send a script for glucometer- check 2-3 times a day-recommend compliance  with BG checks.    # Weight loss: # refer to nutrition/Joli re: weight loss  # IV access:s/p  Mediport placement.  # ACP: FULL code.   *eliagrd 44 -08/08/2023- PSA- poor surrogate.   PS # DISPOSITION:   # chemo today;  # Follow up in 3   weeks-- MD-;labs- cbc/cmp/PSA; Taxoetere chemo;  D-2 injection-Dr.B     All questions were answered. The patient knows to call the clinic with any problems, questions or concerns.    Dea Bitting R Kathaleya Mcduffee, MD 09/19/2023 10:00 AM

## 2023-09-21 ENCOUNTER — Inpatient Hospital Stay

## 2023-09-21 DIAGNOSIS — C61 Malignant neoplasm of prostate: Secondary | ICD-10-CM

## 2023-09-21 DIAGNOSIS — Z5111 Encounter for antineoplastic chemotherapy: Secondary | ICD-10-CM | POA: Diagnosis not present

## 2023-09-21 MED ORDER — PEGFILGRASTIM-CBQV 6 MG/0.6ML ~~LOC~~ SOSY
6.0000 mg | PREFILLED_SYRINGE | Freq: Once | SUBCUTANEOUS | Status: AC
  Administered 2023-09-21: 6 mg via SUBCUTANEOUS
  Filled 2023-09-21: qty 0.6

## 2023-09-21 NOTE — Progress Notes (Signed)
 Nutrition Follow-up:   Patient with prostate cancer.  Patient receiving docetaxel  and prednisone .    Met with patient following injection.  Patient reports that his appetite is good/normal.  Typically does not eat breakfast.  Gets up around 5-6am.  Usually eats lunch and supper.  Goes out to eat (burger and fries).  Last night ate cabbage, [place] necks and potato salad.   Denies nutrition impact symptoms.  Does report some taste alterations  Medications: reviewed  Labs: reviewed  Anthropometrics:   Weight 206 lb today 212 lb on 4/22 215 lb 4/1  UBW of 210-216 lb 3% weight loss in the last 3 months  NUTRITION DIAGNOSIS: Unintentional weight loss likely related to cancer treatment as evidenced by 3% weight loss in the last 3 months   INTERVENTION:  Recommend patient consistently drink oral nutrition supplement in am if not going to eat breakfast.   Continue meals for lunch and supper.  Do not skip meals     MONITORING, EVALUATION, GOAL: weight trends, intake   NEXT VISIT: Wed 8/6 after injection  Zeena Starkel B. Dasie SOLON, CSO, LDN Registered Dietitian [phone]

## 2023-10-01 ENCOUNTER — Other Ambulatory Visit: Payer: Self-pay

## 2023-10-03 ENCOUNTER — Encounter: Payer: Self-pay | Admitting: Internal Medicine

## 2023-10-10 ENCOUNTER — Encounter: Payer: Self-pay | Admitting: Internal Medicine

## 2023-10-10 ENCOUNTER — Inpatient Hospital Stay

## 2023-10-10 ENCOUNTER — Inpatient Hospital Stay: Attending: Internal Medicine

## 2023-10-10 ENCOUNTER — Inpatient Hospital Stay (HOSPITAL_BASED_OUTPATIENT_CLINIC_OR_DEPARTMENT_OTHER): Admitting: Internal Medicine

## 2023-10-10 VITALS — BP 182/85 | HR 67

## 2023-10-10 VITALS — BP 158/74 | HR 73 | Temp 98.7°F | Resp 20 | Ht 72.0 in | Wt 211.0 lb

## 2023-10-10 DIAGNOSIS — C61 Malignant neoplasm of prostate: Secondary | ICD-10-CM | POA: Diagnosis present

## 2023-10-10 DIAGNOSIS — C7951 Secondary malignant neoplasm of bone: Secondary | ICD-10-CM | POA: Insufficient documentation

## 2023-10-10 DIAGNOSIS — Z5189 Encounter for other specified aftercare: Secondary | ICD-10-CM | POA: Diagnosis not present

## 2023-10-10 DIAGNOSIS — Z79899 Other long term (current) drug therapy: Secondary | ICD-10-CM | POA: Diagnosis not present

## 2023-10-10 DIAGNOSIS — I10 Essential (primary) hypertension: Secondary | ICD-10-CM | POA: Insufficient documentation

## 2023-10-10 DIAGNOSIS — C7800 Secondary malignant neoplasm of unspecified lung: Secondary | ICD-10-CM | POA: Diagnosis not present

## 2023-10-10 DIAGNOSIS — F17210 Nicotine dependence, cigarettes, uncomplicated: Secondary | ICD-10-CM | POA: Diagnosis not present

## 2023-10-10 DIAGNOSIS — Z5111 Encounter for antineoplastic chemotherapy: Secondary | ICD-10-CM | POA: Diagnosis present

## 2023-10-10 DIAGNOSIS — E119 Type 2 diabetes mellitus without complications: Secondary | ICD-10-CM | POA: Diagnosis not present

## 2023-10-10 LAB — CMP (CANCER CENTER ONLY)
ALT: 7 U/L (ref 0–44)
AST: 12 U/L — ABNORMAL LOW (ref 15–41)
Albumin: 3.4 g/dL — ABNORMAL LOW (ref 3.5–5.0)
Alkaline Phosphatase: 84 U/L (ref 38–126)
Anion gap: 6 (ref 5–15)
BUN: 14 mg/dL (ref 8–23)
CO2: 24 mmol/L (ref 22–32)
Calcium: 9.3 mg/dL (ref 8.9–10.3)
Chloride: 107 mmol/L (ref 98–111)
Creatinine: 0.95 mg/dL (ref 0.61–1.24)
GFR, Estimated: >60 mL/min (ref >60)
Glucose, Bld: 151 mg/dL — ABNORMAL HIGH (ref 70–99)
Potassium: 3.6 mmol/L (ref 3.5–5.1)
Sodium: 137 mmol/L (ref 135–145)
Total Bilirubin: 0.5 mg/dL (ref 0.0–1.2)
Total Protein: 6.4 g/dL — ABNORMAL LOW (ref 6.5–8.1)

## 2023-10-10 LAB — CBC WITH DIFFERENTIAL (CANCER CENTER ONLY)
Abs Immature Granulocytes: 0.02 K/uL (ref 0.00–0.07)
Basophils Absolute: 0.1 K/uL (ref 0.0–0.1)
Basophils Relative: 1 %
Eosinophils Absolute: 0.1 K/uL (ref 0.0–0.5)
Eosinophils Relative: 1 %
HCT: 32.5 % — ABNORMAL LOW (ref 39.0–52.0)
Hemoglobin: 10.5 g/dL — ABNORMAL LOW (ref 13.0–17.0)
Immature Granulocytes: 0 %
Lymphocytes Relative: 32 %
Lymphs Abs: 3.7 K/uL (ref 0.7–4.0)
MCH: 29.2 pg (ref 26.0–34.0)
MCHC: 32.3 g/dL (ref 30.0–36.0)
MCV: 90.3 fL (ref 80.0–100.0)
Monocytes Absolute: 1.2 K/uL — ABNORMAL HIGH (ref 0.1–1.0)
Monocytes Relative: 11 %
Neutro Abs: 6.5 K/uL (ref 1.7–7.7)
Neutrophils Relative %: 55 %
Platelet Count: 314 K/uL (ref 150–400)
RBC: 3.6 MIL/uL — ABNORMAL LOW (ref 4.22–5.81)
RDW: 19.8 % — ABNORMAL HIGH (ref 11.5–15.5)
WBC Count: 11.5 K/uL — ABNORMAL HIGH (ref 4.0–10.5)
nRBC: 0 % (ref 0.0–0.2)

## 2023-10-10 LAB — PSA: Prostatic Specific Antigen: 3.82 ng/mL (ref 0.00–4.00)

## 2023-10-10 MED ORDER — SODIUM CHLORIDE 0.9% FLUSH
10.0000 mL | INTRAVENOUS | Status: DC | PRN
  Filled 2023-10-10: qty 10

## 2023-10-10 MED ORDER — DEXAMETHASONE SODIUM PHOSPHATE 10 MG/ML IJ SOLN
10.0000 mg | Freq: Once | INTRAMUSCULAR | Status: AC
  Administered 2023-10-10: 10 mg via INTRAVENOUS
  Filled 2023-10-10: qty 1

## 2023-10-10 MED ORDER — SODIUM CHLORIDE 0.9 % IV SOLN
60.0000 mg/m2 | Freq: Once | INTRAVENOUS | Status: AC
  Administered 2023-10-10: 134 mg via INTRAVENOUS
  Filled 2023-10-10: qty 13.4

## 2023-10-10 MED ORDER — SODIUM CHLORIDE 0.9 % IV SOLN
INTRAVENOUS | Status: DC
  Filled 2023-10-10: qty 250

## 2023-10-10 NOTE — Addendum Note (Signed)
 Addended by: LAEL BROWNING A on: 10/10/2023 09:35 AM   Modules accepted: Orders

## 2023-10-10 NOTE — Patient Instructions (Signed)
 [facility] - A DEPT OF [facility]  Discharge Instructions: Thank you for choosing [facility] to provide your oncology and hematology care.  If you have a lab appointment with the Cancer Center, please go directly to the Cancer Center and check in at the registration area.  Wear comfortable clothing and clothing appropriate for easy access to any Portacath or PICC line.   We strive to give you quality time with your provider. You may need to reschedule your appointment if you arrive late (15 or more minutes).  Arriving late affects you and other patients whose appointments are after yours.  Also, if you miss three or more appointments without notifying the office, you may be dismissed from the clinic at the provider's discretion.      For prescription refill requests, have your pharmacy contact our office and allow 72 hours for refills to be completed.    Today you received the following chemotherapy and/or immunotherapy agents: DOCEtaxel     To help prevent nausea and vomiting after your treatment, we encourage you to take your nausea medication as directed.  BELOW ARE SYMPTOMS THAT SHOULD BE REPORTED IMMEDIATELY: *FEVER GREATER THAN 100.4 F (38 C) OR HIGHER *CHILLS OR SWEATING *NAUSEA AND VOMITING THAT IS NOT CONTROLLED WITH YOUR NAUSEA MEDICATION *UNUSUAL SHORTNESS OF BREATH *UNUSUAL BRUISING OR BLEEDING *URINARY PROBLEMS (pain or burning when urinating, or frequent urination) *BOWEL PROBLEMS (unusual diarrhea, constipation, pain near the anus) TENDERNESS IN MOUTH AND THROAT WITH OR WITHOUT PRESENCE OF ULCERS (sore throat, sores in mouth, or a toothache) UNUSUAL RASH, SWELLING OR PAIN  UNUSUAL VAGINAL DISCHARGE OR ITCHING   Items with * indicate a potential emergency and should be followed up as soon as possible or go to the Emergency Department if any problems should occur.  Please show the CHEMOTHERAPY ALERT CARD or IMMUNOTHERAPY  ALERT CARD at check-in to the Emergency Department and triage nurse.  Should you have questions after your visit or need to cancel or reschedule your appointment, please contact [facility] - A DEPT OF [facility]  [phone] and follow the prompts.  Office hours are 8:00 a.m. to 4:30 p.m. Monday - Friday. Please note that voicemails left after 4:00 p.m. may not be returned until the following business day.  We are closed weekends and major holidays. You have access to a nurse at all times for urgent questions. Please call the main number to the clinic [phone] and follow the prompts.  For any non-urgent questions, you may also contact your provider using MyChart. We now offer e-Visits for anyone 68 and older to request care online for non-urgent symptoms. For details visit [email or web address].   Also download the MyChart app! Go to the app store, search "MyChart", open the app, select [facility], and log in with your MyChart username and password.

## 2023-10-10 NOTE — Progress Notes (Signed)
 Patient has no concerns today.

## 2023-10-10 NOTE — Progress Notes (Signed)
 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   # NOV 2021- CTA [ER] Diffuse bilateral pulmonary nodules including a cavitary lesion in the right upper lobe measuring approximately 2 cm. There is extensive nodular pleural thickening bilaterally, greatest in the left lower lobe.  November 2021 PET scan-multiple lung lesions multiple bone lesions and prostate uptake.  February 2022-prostate biopsy [Dr.Brandon]   # DM-2; NO COPD [?]; ACTIVE SMOKER 65ppd.  # DEC 2nd week- FIRMAGON ; # JAN 12TH, 2022-ZYTIGA   1000 MG+ PRED 5MG ; FEB MID 2022- ELIGARD  q6M; MARCH 2022- DISCONTINUE ZYTIGA  sec to PRESS [severe hypokalemia/mental status changes hospitalization];  # MAY 6th, 2022- XTnadi 3pills/day - STOP APRIL 2025  # APRIL 22nd, 2025- Start taxoetere 60 mg/m2 - # 6 cycle on 10/10/2023-   # NGS/MOLECULAR TESTS: MARCH 2022-foundation 1 no targetable mutation./Genetic evaluation negative    # PALLIATIVE CARE EVALUATION:  # PAIN MANAGEMENT:    DIAGNOSIS: Prostate cancer  STAGE:   IV      ;  GOALS: Palliative  CURRENT/MOST RECENT THERAPY : ADT+ Xtandi     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 No pathogenic variants identified on the [facility] Multi-Cancer Panel. VUS in AXIN2 called c.1235A>C identified. The report date is 03/23/2020.   The Multi-Cancer Panel  offered by [facility] includes sequencing and/or deletion duplication testing of the following 84 genes: AIP, ALK, APC, ATM, AXIN2,BAP1,  BARD1, BLM, BMPR1A, BRCA1, BRCA2, BRIP1, CASR, CDC73, CDH1, CDK4, CDKN1B, CDKN1C, CDKN2A (p14ARF), CDKN2A (p16INK4a), CEBPA, CHEK2, CTNNA1, DICER1, DIS3L2, EGFR (c.2369C>T, p.Thr790Met variant only), EPCAM (Deletion/duplication testing only), FH, FLCN, GATA2, GPC3, GREM1 (Promoter region deletion/duplication testing only), HOXB13 (c.251G>A, p.Gly84Glu), HRAS, KIT, MAX, MEN1, MET, MITF (c.952G>A, p.Glu318Lys variant only), MLH1, MSH2, MSH3, MSH6, MUTYH, NBN, NF1, NF2, NTHL1, PALB2, PDGFRA, PHOX2B, PMS2, POLD1, POLE, POT1, PRKAR1A, PTCH1, PTEN, RAD50, RAD51C, RAD51D, RB1, RECQL4, RET, RUNX1, SDHAF2, SDHA (sequence changes only), SDHB, SDHC, SDHD, SMAD4, SMARCA4, SMARCB1, SMARCE1, STK11, SUFU, TERC, TERT, TMEM127, TP53, TSC1, TSC2, VHL, WRN and WT1.    06/28/2023 -  Chemotherapy   Patient is on Treatment Plan : PROSTATE Docetaxel  (75) + Prednisone  q21d       HISTORY OF PRESENTING ILLNESS: Ambulating independently. Alone,   EDUARDO HONOR 83 y.o.  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  Patient is doing well. But has gained weight. Mild swelling in the legs.  No falls.  No fever no chills.  No constipation no diarrhea.  No nausea no vomiting. Denies any worsening pain. No tingling or numbness.   Review of Systems  Constitutional:  Positive for malaise/fatigue. Negative for chills, diaphoresis and fever.  HENT:  Negative for nosebleeds and sore throat.   Eyes:  Negative for double vision.  Respiratory:  Negative for hemoptysis, sputum production, shortness of breath and wheezing.   Cardiovascular:  Negative for  chest pain, palpitations, orthopnea and leg swelling.  Gastrointestinal:  Negative for blood in stool, constipation, diarrhea, heartburn, melena, nausea and  vomiting.  Genitourinary:  Negative for dysuria, frequency and urgency.  Musculoskeletal:  Positive for back pain and myalgias. Negative for joint pain.  Skin: Negative.  Negative for itching and rash.  Neurological:  Negative for dizziness, tingling, focal weakness, weakness and headaches.  Endo/Heme/Allergies:  Does not bruise/bleed easily.  Psychiatric/Behavioral:  Negative for depression. The patient is not nervous/anxious and does not have insomnia.      MEDICAL HISTORY:  Past Medical History:  Diagnosis Date   Arthritis    BPH (benign prostatic hyperplasia)    BPH with obstruction/lower urinary tract symptoms    Diabetes (HCC)    Disorder resulting from impaired renal function    Elevated PSA    Family history of lung cancer    Family history of lymphoma    Heart murmur    Hypertension    Knee pain    Lesion of lung    Low HDL (under 40)    Obesity    Urinary hesitancy     SURGICAL HISTORY: Past Surgical History:  Procedure Laterality Date   HERNIA REPAIR  2012   Umbilical Hernia Repair   IR IMAGING GUIDED PORT INSERTION  06/21/2023   KNEE ARTHROSCOPY Left 04/10/2015   Procedure: ARTHROSCOPY KNEE, partial medial meniscectomy, partial synovectomy;  Surgeon: Ozell Flake, MD;  Location: [facility] ORS;  Service: Orthopedics;  Laterality: Left;    SOCIAL HISTORY: Social History   Socioeconomic History   Marital status: Widowed    Spouse name: Not on file   Number of children: Not on file   Years of education: Not on file   Highest education level: Not on file  Occupational History   Not on file  Tobacco Use   Smoking status: Every Day    Current packs/day: 1.00    Types: Cigarettes   Smokeless tobacco: Never  Vaping Use   Vaping status: Never Used  Substance and Sexual Activity   Alcohol use: No    Alcohol/week: 0.0 standard drinks of alcohol   Drug use: No   Sexual activity: Not Currently  Other Topics Concern   Not on file  Social History Narrative   Lives  in [place]; self; daughter lives 1 mile. Smoke 1ppd > 65 years; stopped 25 years ago.  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    Social Drivers of Corporate investment banker Strain: Not on file  Food Insecurity: No Food Insecurity (07/27/2023)   Received from [facility]   Hunger Vital Sign    Within the past 12 months, you worried that your food would run out before you got the money to buy more.: Never true    Within the past 12 months, the food you bought just didn't last and you didn't have money to get more.: Never true  Transportation Needs: No Transportation Needs (07/27/2023)   [facility] - [facility]    Lack of Transportation (Medical): No    Lack of Transportation (Non-Medical): No  Physical Activity: Not on file  Stress: Not on file  Social Connections: Moderately Integrated (04/08/2023)   Social Connection and Isolation Panel    Frequency of Communication with Friends and Family: More than three times a week    Frequency of Social Gatherings with Friends and Family: More than three times a week    Attends Religious  Services: 1 to 4 times per year    Active Member of Clubs or Organizations: Yes    Attends Banker Meetings: 1 to 4 times per year    Marital Status: Widowed  Intimate Partner Violence: Not At Risk (04/08/2023)   Humiliation, Afraid, Rape, and Kick questionnaire    Fear of Current or Ex-Partner: No    Emotionally Abused: No    Physically Abused: No    Sexually Abused: No    FAMILY HISTORY: Family History  Problem Relation Age of Onset   Diabetes Mother    Lung cancer Mother    Lymphoma Son 28       died in 85.    Kidney disease Neg Hx    Prostate cancer Neg Hx     ALLERGIES:  has no known allergies.  MEDICATIONS:  Current Outpatient Medications  Medication Sig Dispense Refill   acetaminophen  (TYLENOL ) 650 MG CR tablet Take 650 mg by mouth every 8 (eight) hours as needed  for pain.     Blood Glucose Monitoring Suppl DEVI 1 each by Does not apply route in the morning, at noon, and at bedtime. May substitute to any manufacturer covered by patient's insurance. 1 each 0   clopidogrel  (PLAVIX ) 75 MG tablet Take 1 tablet (75 mg total) by mouth daily. 21 tablet 0   cyanocobalamin  2000 MCG tablet Take 1 tablet (2,000 mcg total) by mouth daily. 90 tablet 1   finasteride  (PROSCAR ) 5 MG tablet TAKE 1 TABLET BY MOUTH EVERY DAY 90 tablet 3   furosemide  (LASIX ) 20 MG tablet Take 1 tablet (20 mg total) by mouth daily. 15 tablet 0   Glucose Blood (BLOOD GLUCOSE TEST STRIPS) STRP 1 each by In Vitro route in the morning, at noon, and at bedtime. May substitute to any manufacturer covered by patient's insurance. 100 strip 3   GLUMETZA 1000 MG 24 hr tablet Take 1,000 mg by mouth 2 (two) times daily.     Lancets (FREESTYLE) lancets Use 2-3 times a day or as instructed 100 each 12   lidocaine -prilocaine  (EMLA ) cream Apply 1 Application topically as needed. Apply to port and cover with saran wrap 1-2 hours prior to port access 30 g 1   metFORMIN (GLUCOPHAGE) 1000 MG tablet Take 1,000 mg by mouth 2 (two) times daily with a meal.     ondansetron  (ZOFRAN ) 8 MG tablet Take 1 tablet (8 mg total) by mouth every 8 (eight) hours as needed for nausea or vomiting. 20 tablet 0   prochlorperazine  (COMPAZINE ) 10 MG tablet Take 1 tablet (10 mg total) by mouth every 6 (six) hours as needed for nausea or vomiting. 30 tablet 2   rosuvastatin  (CRESTOR ) 20 MG tablet Take 2 tablets (40 mg total) by mouth at bedtime. 60 tablet 6   telmisartan (MICARDIS) 20 MG tablet Take 1 tablet by mouth daily.     thiamine  100 MG tablet TAKE 1 TABLET BY MOUTH EVERY DAY 90 tablet 1   traMADol  (ULTRAM ) 50 MG tablet Take 1 tablet (50 mg total) by mouth daily as needed. (Patient not taking: Reported on 10/10/2023) 60 tablet 0   No current facility-administered medications for this visit.      SABRA  PHYSICAL EXAMINATION: ECOG  PERFORMANCE STATUS: 0 - Asymptomatic  Vitals:   10/10/23 0828  BP: (!) 158/74  Pulse: 73  Resp: 20  Temp: 98.7 F (37.1 C)  SpO2: 100%        Filed Weights   10/10/23 9171  Weight: 211 lb (95.7 kg)     Physical Exam HENT:     Head: Normocephalic and atraumatic.     Mouth/Throat:     Pharynx: No oropharyngeal exudate.  Eyes:     Pupils: Pupils are equal, round, and reactive to light.  Cardiovascular:     Rate and Rhythm: Normal rate and regular rhythm.  Pulmonary:     Effort: No respiratory distress.     Breath sounds: No wheezing.     Comments: Decreased breath sounds bilaterally. Abdominal:     General: Bowel sounds are normal. There is no distension.     Palpations: Abdomen is soft. There is no mass.     Tenderness: There is no abdominal tenderness. There is no guarding or rebound.  Musculoskeletal:        General: No tenderness. Normal range of motion.     Cervical back: Normal range of motion and neck supple.  Skin:    General: Skin is warm.  Neurological:     Mental Status: He is alert and oriented to person, place, and time.  Psychiatric:        Mood and Affect: Affect normal.      LABORATORY DATA:  I have reviewed the data as listed Lab Results  Component Value Date   WBC 11.5 (H) 10/10/2023   HGB 10.5 (L) 10/10/2023   HCT 32.5 (L) 10/10/2023   MCV 90.3 10/10/2023   PLT 314 10/10/2023   Recent Labs    08/29/23 0857 09/19/23 0846 10/10/23 0758  NA 136 137 137  K 3.6 3.7 3.6  CL 106 110 107  CO2 21* 23 24  GLUCOSE 190* 163* 151*  BUN 13 13 14   CREATININE 0.97 0.98 0.95  CALCIUM  9.1 8.9 9.3  GFRNONAA >60 >60 >60  PROT 6.3* 6.3* 6.4*  ALBUMIN 3.3* 3.5 3.4*  AST 16 15 12*  ALT 8 8 7   ALKPHOS 93 88 84  BILITOT 0.4 0.6 0.5    Latest Reference Range & Units 01/23/20 12:22 03/10/20 14:11 03/19/20 09:19 04/16/20 09:10 05/14/20 10:06 06/13/20 09:26 07/11/20 10:13 08/07/20 09:24 09/04/20 10:13 09/16/20 13:59 10/21/20 13:59 12/23/20 13:27   Prostatic Specific Antigen 0.00 - 4.00 ng/mL 986.00 (H) 34.69 (H) 21.59 (H) 2.68 1.21 0.71 0.68 0.41 0.36 0.28 0.29 0.28  (H): Data is abnormally high  RADIOGRAPHIC STUDIES: I have personally reviewed the radiological images as listed and agreed with the findings in the report. No results found.   ASSESSMENT & PLAN:   Prostate cancer metastatic to multiple sites [facility]) # prostate cancer castrate resistant metastatic to lung/bone [NOV PSA 900-NOV 2021]; On Eligard ;  . NGS-F-ONE 2022- NEGATIVE for any targets. # MARCH 2025- PET scan shows progression-although Lesions are subcentimeter in size; also PSA is rising. APRIL 2025-Taxotere  chemotherapy-every 3 weeks x 6 cycles.  # Proceed with Taxotere   cycle #6 ; Labs-CBC/chemistries were reviewed with the patient. growth factor on day 2 [ reduce to 60 mg/m]. PSA-over all stable.  Will plan be repeating scans after 6 cycles. Will order today.   # HTN- OFF telmisartan   [as per [facility]]- stable.   # Hot flashes- G-1sec to ADT-stable.   # RUL nodule- ~2-3cm; cystic [on PET AUG 2022]-  No new dominant mass, fluid collection or lymph node enlargement in the thorax. on most recent PET scan in OCT 2024.  Will monitor on chemotherapy-stable.      # DM-on metformin-oral hypoglycemic drugs- monitor closley on chemo/steroids- RBG- 151 again recommend compliance  with glucometer- check 2-3 times a day-  # Weight loss:s/p  nutrition/Joli re: weight loss- improved.   # IV access:s/p  Mediport placement.  # ACP: FULL code.   *eliagrd 45 -08/08/2023- PSA- poor surrogate.   PS # DISPOSITION:   # chemo today;  # Follow up in  2 months-- MD-;labs- cbc/cmp/PSA- port flush- PET scan prior-Dr.B      All questions were answered. The patient knows to call the clinic with any problems, questions or concerns.    Cindy JONELLE Joe, MD 10/10/2023 9:01 AM

## 2023-10-10 NOTE — Assessment & Plan Note (Signed)
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 NGS-F-ONE 2022- NEGATIVE for any targets. # MARCH 2025- PET scan shows progression-although Lesions are subcentimeter in size; also PSA is rising. APRIL 2025-Taxotere  chemotherapy-every 3 weeks x 6 cycles.  # Proceed with Taxotere   cycle #6 ; Labs-CBC/chemistries were reviewed with the patient. growth factor on day 2 [ reduce to 60 mg/m]. PSA-over all stable.  Will plan be repeating scans after 6 cycles. Will order today.   # HTN- OFF telmisartan   [as per [facility]]- stable.   # Hot flashes- G-1sec to ADT-stable.   # RUL nodule- ~2-3cm; cystic [on PET AUG 2022]-  No new dominant mass, fluid collection or lymph node enlargement in the thorax. on most recent PET scan in OCT 2024.  Will monitor on chemotherapy-stable.      # DM-on metformin-oral hypoglycemic drugs- monitor closley on chemo/steroids- RBG- 151 again recommend compliance with glucometer- check 2-3 times a day-  # Weight loss:s/p  nutrition/Joli re: weight loss- improved.   # IV access:s/p  Mediport placement.  # ACP: FULL code.   *eliagrd 45 -08/08/2023- PSA- poor surrogate.   PS # DISPOSITION:   # chemo today;  # Follow up in  2 months-- MD-;labs- cbc/cmp/PSA- port flush- PET scan prior-Dr.B

## 2023-10-11 ENCOUNTER — Other Ambulatory Visit: Payer: Self-pay

## 2023-10-12 ENCOUNTER — Inpatient Hospital Stay: Admitting: Internal Medicine

## 2023-10-12 ENCOUNTER — Inpatient Hospital Stay

## 2023-10-12 DIAGNOSIS — Z5111 Encounter for antineoplastic chemotherapy: Secondary | ICD-10-CM | POA: Diagnosis not present

## 2023-10-12 DIAGNOSIS — C61 Malignant neoplasm of prostate: Secondary | ICD-10-CM

## 2023-10-12 MED ORDER — PEGFILGRASTIM-CBQV 6 MG/0.6ML ~~LOC~~ SOSY
6.0000 mg | PREFILLED_SYRINGE | Freq: Once | SUBCUTANEOUS | Status: AC
  Administered 2023-10-12: 6 mg via SUBCUTANEOUS
  Filled 2023-10-12: qty 0.6

## 2023-10-12 NOTE — Progress Notes (Signed)
 Nutrition Follow-up:  Patient with prostate cancer.  Patient has completed chemotherapy.   Met with patient following injection.  Reports that he has been eating breakfast more consistently.  Less lunch and eating a good supper.  Drinking shakes a few times a week.  Weight has increased.     Medications: reviewed  Labs: reviewed  Anthropometrics:   Weight 211 lb (some swelling)  206 lb on 7/16 212 lb on 4/22 215 lb on 4/1  UBW of 210-216 lb   NUTRITION DIAGNOSIS: Unintentional weight loss improved    INTERVENTION:  Continue current eating pattern to help maintain weight and recover from treatment Patient will contact RD if needed in the future.  Contact information provided    MONITORING, EVALUATION, GOAL: weight trends, intake   NEXT VISIT: no follow-up RD available if needed  Madgeline Rayo B. Dasie SOLON, CSO, LDN Registered Dietitian [phone]

## 2023-11-11 ENCOUNTER — Telehealth: Payer: Self-pay | Admitting: Internal Medicine

## 2023-11-11 NOTE — Telephone Encounter (Signed)
 Called patient to inform him of change of provider for visit on 10/13. Patient did not answer phone. Not able to leave a voicemail.

## 2023-11-24 ENCOUNTER — Encounter: Payer: Self-pay | Admitting: Internal Medicine

## 2023-12-09 ENCOUNTER — Ambulatory Visit

## 2023-12-19 ENCOUNTER — Encounter: Payer: Self-pay | Admitting: Oncology

## 2023-12-19 ENCOUNTER — Inpatient Hospital Stay: Attending: Internal Medicine

## 2023-12-19 ENCOUNTER — Inpatient Hospital Stay (HOSPITAL_BASED_OUTPATIENT_CLINIC_OR_DEPARTMENT_OTHER): Admitting: Oncology

## 2023-12-19 ENCOUNTER — Other Ambulatory Visit: Payer: Self-pay

## 2023-12-19 VITALS — BP 127/69 | HR 82 | Temp 96.8°F | Resp 18 | Ht 72.0 in | Wt 208.3 lb

## 2023-12-19 DIAGNOSIS — T451X5A Adverse effect of antineoplastic and immunosuppressive drugs, initial encounter: Secondary | ICD-10-CM

## 2023-12-19 DIAGNOSIS — C61 Malignant neoplasm of prostate: Secondary | ICD-10-CM | POA: Insufficient documentation

## 2023-12-19 DIAGNOSIS — C7951 Secondary malignant neoplasm of bone: Secondary | ICD-10-CM | POA: Diagnosis present

## 2023-12-19 DIAGNOSIS — Z9221 Personal history of antineoplastic chemotherapy: Secondary | ICD-10-CM | POA: Diagnosis not present

## 2023-12-19 DIAGNOSIS — C7800 Secondary malignant neoplasm of unspecified lung: Secondary | ICD-10-CM | POA: Insufficient documentation

## 2023-12-19 DIAGNOSIS — F17210 Nicotine dependence, cigarettes, uncomplicated: Secondary | ICD-10-CM | POA: Insufficient documentation

## 2023-12-19 DIAGNOSIS — G620 Drug-induced polyneuropathy: Secondary | ICD-10-CM | POA: Insufficient documentation

## 2023-12-19 LAB — CMP (CANCER CENTER ONLY)
ALT: 9 U/L (ref 0–44)
AST: 17 U/L (ref 15–41)
Albumin: 3.6 g/dL (ref 3.5–5.0)
Alkaline Phosphatase: 99 U/L (ref 38–126)
Anion gap: 7 (ref 5–15)
BUN: 13 mg/dL (ref 8–23)
CO2: 25 mmol/L (ref 22–32)
Calcium: 9.3 mg/dL (ref 8.9–10.3)
Chloride: 104 mmol/L (ref 98–111)
Creatinine: 1.1 mg/dL (ref 0.61–1.24)
GFR, Estimated: >60 mL/min (ref >60)
Glucose, Bld: 194 mg/dL — ABNORMAL HIGH (ref 70–99)
Potassium: 3.7 mmol/L (ref 3.5–5.1)
Sodium: 136 mmol/L (ref 135–145)
Total Bilirubin: 0.7 mg/dL (ref 0.0–1.2)
Total Protein: 6.8 g/dL (ref 6.5–8.1)

## 2023-12-19 LAB — CBC WITH DIFFERENTIAL (CANCER CENTER ONLY)
Abs Immature Granulocytes: 0.04 K/uL (ref 0.00–0.07)
Basophils Absolute: 0.1 K/uL (ref 0.0–0.1)
Basophils Relative: 1 %
Eosinophils Absolute: 0.3 K/uL (ref 0.0–0.5)
Eosinophils Relative: 2 %
HCT: 38.1 % — ABNORMAL LOW (ref 39.0–52.0)
Hemoglobin: 12.2 g/dL — ABNORMAL LOW (ref 13.0–17.0)
Immature Granulocytes: 0 %
Lymphocytes Relative: 41 %
Lymphs Abs: 4.3 K/uL — ABNORMAL HIGH (ref 0.7–4.0)
MCH: 28.4 pg (ref 26.0–34.0)
MCHC: 32 g/dL (ref 30.0–36.0)
MCV: 88.8 fL (ref 80.0–100.0)
Monocytes Absolute: 0.8 K/uL (ref 0.1–1.0)
Monocytes Relative: 8 %
Neutro Abs: 5.1 K/uL (ref 1.7–7.7)
Neutrophils Relative %: 48 %
Platelet Count: 316 K/uL (ref 150–400)
RBC: 4.29 MIL/uL (ref 4.22–5.81)
RDW: 15.2 % (ref 11.5–15.5)
WBC Count: 10.6 K/uL — ABNORMAL HIGH (ref 4.0–10.5)
nRBC: 0 % (ref 0.0–0.2)

## 2023-12-19 NOTE — Progress Notes (Signed)
 Hematology/Oncology Consult note [facility]  Telephone:([phone] [phone]  Patient Care Team: Pasquale Babette NOVAK, MD as PCP - General (Family Medicine) Verdene Gills, RN as Oncology Nurse Navigator Rennie Cindy SAUNDERS, MD as Consulting Physician (Oncology)   Name of the patient: Brent Glass  [id number]  [birth year]   Date of visit: 12/19/23  Diagnosis-castrate sensitive metastatic prostate cancer  Chief complaint/ Reason for visit-discuss further management of prostate cancer  Heme/Onc history:  Oncology History Overview Note  # NOV 2021- prostate cancer metastatic to lung/bone [NOV PSA 900+; no biopsy].   # NOV 2021- CTA [ER] Diffuse bilateral pulmonary nodules including a cavitary lesion in the right upper lobe measuring approximately 2 cm. There is extensive nodular pleural thickening bilaterally, greatest in the left lower lobe.  November 2021 PET scan-multiple lung lesions multiple bone lesions and prostate uptake.  February 2022-prostate biopsy [Dr.Brandon]   # DM-2; NO COPD [?]; ACTIVE SMOKER 65ppd.  # DEC 2nd week- FIRMAGON ; # JAN 12TH, 2022-ZYTIGA   1000 MG+ PRED 5MG ; FEB MID 2022- ELIGARD  q6M; MARCH 2022- DISCONTINUE ZYTIGA  sec to PRESS [severe hypokalemia/mental status changes hospitalization];  # MAY 6th, 2022- XTnadi 3pills/day - STOP APRIL 2025  # APRIL 22nd, 2025- Start taxoetere 60 mg/m2 - # 6 cycle on 10/10/2023-   # NGS/MOLECULAR TESTS: MARCH 2022-foundation 1 no targetable mutation./Genetic evaluation negative    # PALLIATIVE CARE EVALUATION:  # PAIN MANAGEMENT:    DIAGNOSIS: Prostate cancer  STAGE:   IV      ;  GOALS: Palliative  CURRENT/MOST RECENT THERAPY : ADT+ Xtandi     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 No pathogenic variants identified on the [facility] Multi-Cancer Panel. VUS in AXIN2 called c.1235A>C identified. The report date is 03/23/2020.   The Multi-Cancer Panel offered by [facility] includes sequencing and/or deletion duplication testing of the following 84 genes: AIP, ALK, APC, ATM, AXIN2,BAP1,  BARD1, BLM, BMPR1A, BRCA1, BRCA2, BRIP1, CASR, CDC73, CDH1, CDK4, CDKN1B, CDKN1C, CDKN2A (p14ARF), CDKN2A (p16INK4a), CEBPA, CHEK2, CTNNA1, DICER1, DIS3L2, EGFR (c.2369C>T, p.Thr790Met variant only), EPCAM (Deletion/duplication testing only), FH, FLCN, GATA2, GPC3, GREM1 (Promoter region deletion/duplication testing only), HOXB13 (c.251G>A, p.Gly84Glu), HRAS, KIT, MAX, MEN1, MET, MITF (c.952G>A, p.Glu318Lys variant only), MLH1, MSH2, MSH3, MSH6, MUTYH, NBN, NF1, NF2, NTHL1, PALB2, PDGFRA, PHOX2B, PMS2, POLD1, POLE, POT1, PRKAR1A, PTCH1, PTEN, RAD50, RAD51C, RAD51D, RB1, RECQL4, RET, RUNX1, SDHAF2, SDHA (sequence changes only), SDHB, SDHC, SDHD, SMAD4, SMARCA4, SMARCB1, SMARCE1, STK11, SUFU, TERC, TERT, TMEM127, TP53, TSC1, TSC2, VHL, WRN and WT1.    06/28/2023 -  Chemotherapy   Patient is on Treatment Plan : PROSTATE Docetaxel  (75) + Prednisone  q21d      Patient completed 6 cycles of docetaxel  chemotherapy on 10/10/2023.  He is presently on Eligard   Interval history- Brent Glass is an 84 year old male with prostate cancer who presents for follow-up after chemotherapy.  He tolerated chemotherapy relatively well.He experiences some tingling in his fingers. No significant numbness, falls, worsening pain, cough, or shortness of breath. His appetite is fine.  ECOG PS- 1 Pain scale- 0   Review of systems- Review of Systems  Constitutional:  Negative for chills, fever, malaise/fatigue and weight loss.  HENT:  Negative for congestion, ear discharge and nosebleeds.   Eyes:  Negative for blurred vision.   Respiratory:  Negative for cough, hemoptysis, sputum production, shortness of breath and wheezing.   Cardiovascular:  Negative for chest pain, palpitations, orthopnea and claudication.  Gastrointestinal:  Negative for abdominal pain, blood in stool, constipation, diarrhea, heartburn, melena, nausea and vomiting.  Genitourinary:  Negative for dysuria, flank pain, frequency, hematuria and urgency.  Musculoskeletal:  Negative for back pain, joint pain and myalgias.  Skin:  Negative for rash.  Neurological:  Positive for sensory change (Peripheral neuropathy). Negative for dizziness, tingling, focal weakness, seizures, weakness and headaches.  Endo/Heme/Allergies:  Does not bruise/bleed easily.  Psychiatric/Behavioral:  Negative for depression and suicidal ideas. The patient does not have insomnia.       No Known Allergies   Past Medical History:  Diagnosis Date   Arthritis    BPH (benign prostatic hyperplasia)    BPH with obstruction/lower urinary tract symptoms    Diabetes (HCC)    Disorder resulting from impaired renal function    Elevated PSA    Family history of lung cancer    Family history of lymphoma    Heart murmur    Hypertension    Knee pain    Lesion of lung    Low HDL (under 40)    Obesity    Urinary hesitancy      Past Surgical History:  Procedure Laterality Date   HERNIA REPAIR  2012   Umbilical Hernia Repair   IR IMAGING GUIDED PORT INSERTION  06/21/2023   KNEE ARTHROSCOPY Left 04/10/2015   Procedure: ARTHROSCOPY KNEE, partial medial meniscectomy, partial synovectomy;  Surgeon: Ozell Flake, MD;  Location: [facility] ORS;  Service: Orthopedics;  Laterality: Left;    Social History   Socioeconomic History   Marital status: Widowed    Spouse name: Not on file   Number of children: Not on file   Years of education: Not on file   Highest education level: Not on file  Occupational History   Not on file  Tobacco Use   Smoking status: Every Day    Current  packs/day: 1.00    Types: Cigarettes   Smokeless tobacco: Never  Vaping Use   Vaping status: Never Used  Substance and Sexual Activity   Alcohol use: No    Alcohol/week: 0.0 standard drinks of alcohol   Drug use: No   Sexual activity: Not Currently  Other Topics Concern   Not on file  Social History Narrative   Lives in [place]; self; daughter lives 1 mile. Smoke 1ppd > 65 years; stopped 25 years ago.  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    Social Drivers of Corporate investment banker Strain: Not on file  Food Insecurity: No Food Insecurity (07/27/2023)   [facility]   Hunger Vital Sign    Within the past 12 months, you worried that your food would run out before you got the money to buy more.: Never true    Within the past 12 months, the food you bought just didn't last and you didn't have money to get more.: Never true  Transportation Needs: No Transportation Needs (07/27/2023)   Received [facility]    Lack of Transportation (Medical): No    Lack of Transportation (Non-Medical): No  Physical Activity: Not on file  Stress: Not on file  Social Connections: Moderately Integrated (04/08/2023)   Social  Connection and Isolation Panel    Frequency of Communication with Friends and Family: More than three times a week    Frequency of Social Gatherings with Friends and Family: More than three times a week    Attends Religious Services: 1 to 4 times per year    Active Member of [employer] or Organizations: Yes    Attends Banker Meetings: 1 to 4 times per year    Marital Status: Widowed  Intimate Partner Violence: Not At Risk (04/08/2023)   Humiliation, Afraid, Rape, and Kick questionnaire    Fear of Current or Ex-Partner: No    Emotionally Abused: No    Physically Abused: No    Sexually Abused: No    Family History  Problem Relation Age of Onset   Diabetes Mother    Lung cancer Mother     Lymphoma Son 28       died in 5.    Kidney disease Neg Hx    Prostate cancer Neg Hx      Current Outpatient Medications:    acetaminophen  (TYLENOL ) 650 MG CR tablet, Take 650 mg by mouth every 8 (eight) hours as needed for pain., Disp: , Rfl:    Blood Glucose Monitoring Suppl DEVI, 1 each by Does not apply route in the morning, at noon, and at bedtime. May substitute to any manufacturer covered by patient's insurance., Disp: 1 each, Rfl: 0   clopidogrel  (PLAVIX ) 75 MG tablet, Take 1 tablet (75 mg total) by mouth daily., Disp: 21 tablet, Rfl: 0   cyanocobalamin  2000 MCG tablet, Take 1 tablet (2,000 mcg total) by mouth daily., Disp: 90 tablet, Rfl: 1   finasteride  (PROSCAR ) 5 MG tablet, TAKE 1 TABLET BY MOUTH EVERY DAY, Disp: 90 tablet, Rfl: 3   furosemide  (LASIX ) 20 MG tablet, Take 1 tablet (20 mg total) by mouth daily., Disp: 15 tablet, Rfl: 0   GLUMETZA 1000 MG 24 hr tablet, Take 1,000 mg by mouth 2 (two) times daily., Disp: , Rfl:    Lancets (FREESTYLE) lancets, Use 2-3 times a day or as instructed, Disp: 100 each, Rfl: 12   lidocaine -prilocaine  (EMLA ) cream, Apply 1 Application topically as needed. Apply to port and cover with saran wrap 1-2 hours prior to port access, Disp: 30 g, Rfl: 1   metFORMIN (GLUCOPHAGE) 1000 MG tablet, Take 1,000 mg by mouth 2 (two) times daily with a meal., Disp: , Rfl:    ondansetron  (ZOFRAN ) 8 MG tablet, Take 1 tablet (8 mg total) by mouth every 8 (eight) hours as needed for nausea or vomiting., Disp: 20 tablet, Rfl: 0   prochlorperazine  (COMPAZINE ) 10 MG tablet, Take 1 tablet (10 mg total) by mouth every 6 (six) hours as needed for nausea or vomiting., Disp: 30 tablet, Rfl: 2   rosuvastatin  (CRESTOR ) 20 MG tablet, Take 2 tablets (40 mg total) by mouth at bedtime., Disp: 60 tablet, Rfl: 6   telmisartan (MICARDIS) 20 MG tablet, Take 1 tablet by mouth daily., Disp: , Rfl:    thiamine  100 MG tablet, TAKE 1 TABLET BY MOUTH EVERY DAY, Disp: 90 tablet, Rfl: 1    traMADol  (ULTRAM ) 50 MG tablet, Take 1 tablet (50 mg total) by mouth daily as needed. (Patient not taking: Reported on 10/10/2023), Disp: 60 tablet, Rfl: 0  Physical exam: There were no vitals filed for this visit. Physical Exam Cardiovascular:     Rate and Rhythm: Normal rate and regular rhythm.     Heart sounds: Normal heart sounds.  Pulmonary:  Effort: Pulmonary effort is normal.     Breath sounds: Normal breath sounds.  Abdominal:     General: Bowel sounds are normal.     Palpations: Abdomen is soft.  Skin:    General: Skin is warm and dry.  Neurological:     Mental Status: He is alert and oriented to person, place, and time.      I have personally reviewed labs listed below:    Latest Ref Rng & Units 10/10/2023    7:58 AM  CMP  Glucose 70 - 99 mg/dL 848   BUN 8 - 23 mg/dL 14   Creatinine 9.38 - 1.24 mg/dL 9.04   Sodium 864 - 854 mmol/L 137   Potassium 3.5 - 5.1 mmol/L 3.6   Chloride 98 - 111 mmol/L 107   CO2 22 - 32 mmol/L 24   Calcium  8.9 - 10.3 mg/dL 9.3   Total Protein 6.5 - 8.1 g/dL 6.4   Total Bilirubin 0.0 - 1.2 mg/dL 0.5   Alkaline Phos 38 - 126 U/L 84   AST 15 - 41 U/L 12   ALT 0 - 44 U/L 7       Latest Ref Rng & Units 10/10/2023    7:58 AM  CBC  WBC 4.0 - 10.5 K/uL 11.5   Hemoglobin 13.0 - 17.0 g/dL 89.4   Hematocrit 60.9 - 52.0 % 32.5   Platelets 150 - 400 K/uL 314       Assessment and plan- Patient is a 83 y.o. 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 Completed six cycles of docetaxel  in August. PSA increased from 3.5 to 3.8 over three months. Asymptomatic, maintaining good quality of life. - It appears that PSMA PET scan was denied by insurance.  Coordinate with insurance for PSMA PET scan and need for  peer to peer versus CT chest abdomen and pelvis with contrast and bone scan  within a week. - Check current PSA levels to assess chemotherapy response. - Schedule follow-up with Dr. Rennie in three weeks to discuss scan results. - Administer next Eligard  injection in early December.  Chemotherapy-induced peripheral neuropathy Mild tingling in two fingers, not significantly impacting quality of life.   Visit Diagnosis 1. Chemotherapy-induced peripheral neuropathy   2. Prostate cancer metastatic to multiple sites [facility])      Dr. Annah Skene, MD, MPH [facility] [phone] 12/19/2023 9:13 AM

## 2023-12-19 NOTE — Progress Notes (Signed)
 not taking Lasix , says he ran out.  States bowels are sometimes a little loose.  Tingling in thumb, has been going on for months.  Would like to know if he needs to get a COVID or flu shot; if he doesn't need to he won't

## 2023-12-20 ENCOUNTER — Other Ambulatory Visit: Payer: Self-pay

## 2023-12-20 LAB — PSA: Prostatic Specific Antigen: 6.83 ng/mL — ABNORMAL HIGH (ref 0.00–4.00)

## 2023-12-23 ENCOUNTER — Other Ambulatory Visit: Payer: Self-pay

## 2023-12-28 ENCOUNTER — Ambulatory Visit
Admission: RE | Admit: 2023-12-28 | Discharge: 2023-12-28 | Disposition: A | Discharge status: Home / Self Care | Source: Ambulatory Visit | Attending: Oncology | Admitting: Oncology

## 2023-12-28 DIAGNOSIS — C7802 Secondary malignant neoplasm of left lung: Secondary | ICD-10-CM | POA: Insufficient documentation

## 2023-12-28 DIAGNOSIS — C7951 Secondary malignant neoplasm of bone: Secondary | ICD-10-CM | POA: Insufficient documentation

## 2023-12-28 DIAGNOSIS — C61 Malignant neoplasm of prostate: Secondary | ICD-10-CM | POA: Diagnosis present

## 2023-12-28 MED ORDER — FLOTUFOLASTAT F 18 GALLIUM 296-5846 MBQ/ML IV SOLN
8.0000 | Freq: Once | INTRAVENOUS | Status: AC
  Administered 2023-12-28: 8.25 via INTRAVENOUS
  Filled 2023-12-28: qty 8

## 2023-12-29 ENCOUNTER — Ambulatory Visit: Payer: Self-pay | Admitting: Oncology

## 2024-01-01 ENCOUNTER — Other Ambulatory Visit: Payer: Self-pay

## 2024-01-03 ENCOUNTER — Other Ambulatory Visit: Payer: Self-pay

## 2024-01-03 DIAGNOSIS — C61 Malignant neoplasm of prostate: Secondary | ICD-10-CM

## 2024-01-04 ENCOUNTER — Encounter: Payer: Self-pay | Admitting: Radiation Oncology

## 2024-01-04 ENCOUNTER — Ambulatory Visit
Admission: RE | Admit: 2024-01-04 | Discharge: 2024-01-04 | Disposition: A | Discharge status: Home / Self Care | Source: Ambulatory Visit | Attending: Radiation Oncology | Admitting: Radiation Oncology

## 2024-01-04 VITALS — Resp 15 | Ht 72.0 in | Wt 214.0 lb

## 2024-01-04 DIAGNOSIS — Z7984 Long term (current) use of oral hypoglycemic drugs: Secondary | ICD-10-CM | POA: Diagnosis not present

## 2024-01-04 DIAGNOSIS — M25569 Pain in unspecified knee: Secondary | ICD-10-CM | POA: Insufficient documentation

## 2024-01-04 DIAGNOSIS — E119 Type 2 diabetes mellitus without complications: Secondary | ICD-10-CM | POA: Diagnosis not present

## 2024-01-04 DIAGNOSIS — Z806 Family history of leukemia: Secondary | ICD-10-CM | POA: Diagnosis not present

## 2024-01-04 DIAGNOSIS — R918 Other nonspecific abnormal finding of lung field: Secondary | ICD-10-CM | POA: Diagnosis not present

## 2024-01-04 DIAGNOSIS — F17210 Nicotine dependence, cigarettes, uncomplicated: Secondary | ICD-10-CM | POA: Diagnosis not present

## 2024-01-04 DIAGNOSIS — R011 Cardiac murmur, unspecified: Secondary | ICD-10-CM | POA: Insufficient documentation

## 2024-01-04 DIAGNOSIS — R3911 Hesitancy of micturition: Secondary | ICD-10-CM | POA: Diagnosis not present

## 2024-01-04 DIAGNOSIS — Z7902 Long term (current) use of antithrombotics/antiplatelets: Secondary | ICD-10-CM | POA: Diagnosis not present

## 2024-01-04 DIAGNOSIS — C61 Malignant neoplasm of prostate: Secondary | ICD-10-CM | POA: Diagnosis not present

## 2024-01-04 DIAGNOSIS — M1990 Unspecified osteoarthritis, unspecified site: Secondary | ICD-10-CM | POA: Insufficient documentation

## 2024-01-04 DIAGNOSIS — Z79899 Other long term (current) drug therapy: Secondary | ICD-10-CM | POA: Diagnosis not present

## 2024-01-04 DIAGNOSIS — Z801 Family history of malignant neoplasm of trachea, bronchus and lung: Secondary | ICD-10-CM | POA: Insufficient documentation

## 2024-01-04 DIAGNOSIS — C7951 Secondary malignant neoplasm of bone: Secondary | ICD-10-CM | POA: Insufficient documentation

## 2024-01-04 NOTE — Consult Note (Signed)
 NEW PATIENT EVALUATION  Name: Brent Glass  MRN: [id number]  Date:   01/04/2024     DOB: [birth year]   This 83 y.o. male patient presents to the clinic for initial evaluation of stage IV prostate cancer with PSMA positive lesion in L4 vertebral body.  REFERRING PHYSICIAN: McMurtrey, Tayler B, MD  CHIEF COMPLAINT:  Chief Complaint  Patient presents with   Bone Pain    DIAGNOSIS: The encounter diagnosis was Prostate cancer metastatic to multiple sites [facility]).   PREVIOUS INVESTIGATIONS:  PSMA PET scan reviewed Clinical notes reviewed Pathology reports reviewed  HPI: Patient is an 83 year old male presented in November 2021 with stage IV prostate cancer with metastatic disease to both lung and bone.  He has bilateral pulmonary nodules.  He has been treated with Firmagon  and Eligard  as well as Xtandi  in the past.  More recently he has been on Taxotere  with a 6 cycle completed in August 2025.  He had a recent PSMA PET scan showing mild progression of metastatic prostate cancer with increased size and activity of small left lung nodules and a new metastatic lesion in the L4 vertebral body.  He is asymptomatic specifically denies any problems with ambulation any lower back pain.  He is having no pulmonary symptoms.  PLANNED TREATMENT REGIMEN: SBRT to his L4 vertebral body  PAST MEDICAL HISTORY:  has a past medical history of Arthritis, BPH (benign prostatic hyperplasia), BPH with obstruction/lower urinary tract symptoms, Diabetes (HCC), Disorder resulting from impaired renal function, Elevated PSA, Family history of lung cancer, Family history of lymphoma, Heart murmur, Hypertension, Knee pain, Lesion of lung, Low HDL (under 40), Obesity, and Urinary hesitancy.    PAST SURGICAL HISTORY:  Past Surgical History:  Procedure Laterality Date   HERNIA REPAIR  2012   Umbilical Hernia Repair   IR IMAGING GUIDED PORT INSERTION  06/21/2023   KNEE ARTHROSCOPY Left 04/10/2015   Procedure: ARTHROSCOPY  KNEE, partial medial meniscectomy, partial synovectomy;  Surgeon: Ozell Flake, MD;  Location: [facility] ORS;  Service: Orthopedics;  Laterality: Left;    FAMILY HISTORY: family history includes Diabetes in his mother; Lung cancer in his mother; Lymphoma (age of onset: 32) in his son.  SOCIAL HISTORY:  reports that he has been smoking cigarettes. He has never used smokeless tobacco. He reports that he does not drink alcohol and does not use drugs.  ALLERGIES: Patient has no known allergies.  MEDICATIONS:  Current Outpatient Medications  Medication Sig Dispense Refill   acetaminophen  (TYLENOL ) 650 MG CR tablet Take 650 mg by mouth every 8 (eight) hours as needed for pain.     Blood Glucose Monitoring Suppl DEVI 1 each by Does not apply route in the morning, at noon, and at bedtime. May substitute to any manufacturer covered by patient's insurance. 1 each 0   clopidogrel  (PLAVIX ) 75 MG tablet Take 1 tablet (75 mg total) by mouth daily. 21 tablet 0   cyanocobalamin  2000 MCG tablet Take 1 tablet (2,000 mcg total) by mouth daily. 90 tablet 1   finasteride  (PROSCAR ) 5 MG tablet TAKE 1 TABLET BY MOUTH EVERY DAY 90 tablet 3   furosemide  (LASIX ) 20 MG tablet Take 1 tablet (20 mg total) by mouth daily. (Patient not taking: Reported on 12/19/2023) 15 tablet 0   GLUMETZA 1000 MG 24 hr tablet Take 1,000 mg by mouth 2 (two) times daily.     Lancets (FREESTYLE) lancets Use 2-3 times a day or as instructed 100 each 12   lidocaine -prilocaine  (EMLA ) cream  Apply 1 Application topically as needed. Apply to port and cover with saran wrap 1-2 hours prior to port access 30 g 1   metFORMIN (GLUCOPHAGE) 1000 MG tablet Take 1,000 mg by mouth 2 (two) times daily with a meal.     ondansetron  (ZOFRAN ) 8 MG tablet Take 1 tablet (8 mg total) by mouth every 8 (eight) hours as needed for nausea or vomiting. 20 tablet 0   prochlorperazine  (COMPAZINE ) 10 MG tablet Take 1 tablet (10 mg total) by mouth every 6 (six) hours as needed  for nausea or vomiting. 30 tablet 2   rosuvastatin  (CRESTOR ) 20 MG tablet Take 2 tablets (40 mg total) by mouth at bedtime. 60 tablet 6   telmisartan (MICARDIS) 20 MG tablet Take 1 tablet by mouth daily.     thiamine  100 MG tablet TAKE 1 TABLET BY MOUTH EVERY DAY 90 tablet 1   traMADol  (ULTRAM ) 50 MG tablet Take 1 tablet (50 mg total) by mouth daily as needed. (Patient not taking: Reported on 10/10/2023) 60 tablet 0   No current facility-administered medications for this encounter.    ECOG PERFORMANCE STATUS:  0 - Asymptomatic  REVIEW OF SYSTEMS: Patient denies any weight loss, fatigue, weakness, fever, chills or night sweats. Patient denies any loss of vision, blurred vision. Patient denies any ringing  of the ears or hearing loss. No irregular heartbeat. Patient denies heart murmur or history of fainting. Patient denies any chest pain or pain radiating to her upper extremities. Patient denies any shortness of breath, difficulty breathing at night, cough or hemoptysis. Patient denies any swelling in the lower legs. Patient denies any nausea vomiting, vomiting of blood, or coffee ground material in the vomitus. Patient denies any stomach pain. Patient states has had normal bowel movements no significant constipation or diarrhea. Patient denies any dysuria, hematuria or significant nocturia. Patient denies any problems walking, swelling in the joints or loss of balance. Patient denies any skin changes, loss of hair or loss of weight. Patient denies any excessive worrying or anxiety or significant depression. Patient denies any problems with insomnia. Patient denies excessive thirst, polyuria, polydipsia. Patient denies any swollen glands, patient denies easy bruising or easy bleeding. Patient denies any recent infections, allergies or URI. Patient s visual fields have not changed significantly in recent time.   PHYSICAL EXAM: Resp 15   Ht 6' (1.829 m)   Wt 214 lb (97.1 kg)   BMI 29.02 kg/m  Deep  palpation of his lower spine does not elicit pain motor and sensory levels in the lower extremities are equal and symmetric range of motion of his lower extremities does not elicit pain.  Well-developed well-nourished patient in NAD. HEENT reveals PERLA, EOMI, discs not visualized.  Oral cavity is clear. No oral mucosal lesions are identified. Neck is clear without evidence of cervical or supraclavicular adenopathy. Lungs are clear to A&P. Cardiac examination is essentially unremarkable with regular rate and rhythm without murmur rub or thrill. Abdomen is benign with no organomegaly or masses noted. Motor sensory and DTR levels are equal and symmetric in the upper and lower extremities. Cranial nerves II through XII are grossly intact. Proprioception is intact. No peripheral adenopathy or edema is identified. No motor or sensory levels are noted. Crude visual fields are within normal range.  LABORATORY DATA: Pathology and labs reviewed    RADIOLOGY RESULTS: PSMA PET scan reviewed compatible with above-stated findings   IMPRESSION: Progressive metastatic prostate cancer and L4 vertebral body in 83 year old male.  PLAN:  This time like to go ahead with SBRT to his L4 vertebral body.  Would plan on delivering 30 Gray in 5 fractions.  Risks and benefits of treatment including extremely low side effect profile possibly fatigue were reviewed with the patient in detail.  I have personally set up and ordered CT simulation for next week.  Patient comprehends my recommendations well.  I would like to take this opportunity to thank you for allowing me to participate in the care of your patient.SABRA Marcey Penton, MD

## 2024-01-06 ENCOUNTER — Other Ambulatory Visit: Payer: Self-pay

## 2024-01-06 ENCOUNTER — Encounter: Payer: Self-pay | Admitting: Internal Medicine

## 2024-01-06 NOTE — Telephone Encounter (Signed)
 Encounter opened in error.

## 2024-01-10 ENCOUNTER — Ambulatory Visit
Admission: RE | Admit: 2024-01-10 | Discharge: 2024-01-10 | Disposition: A | Discharge status: Home / Self Care | Source: Ambulatory Visit | Attending: Radiation Oncology | Admitting: Radiation Oncology

## 2024-01-10 ENCOUNTER — Encounter: Payer: Self-pay | Admitting: Internal Medicine

## 2024-01-10 ENCOUNTER — Inpatient Hospital Stay (HOSPITAL_BASED_OUTPATIENT_CLINIC_OR_DEPARTMENT_OTHER): Admitting: Internal Medicine

## 2024-01-10 VITALS — BP 131/53 | HR 75 | Temp 96.1°F | Resp 16 | Ht 72.0 in | Wt 215.7 lb

## 2024-01-10 DIAGNOSIS — Z807 Family history of other malignant neoplasms of lymphoid, hematopoietic and related tissues: Secondary | ICD-10-CM | POA: Insufficient documentation

## 2024-01-10 DIAGNOSIS — I10 Essential (primary) hypertension: Secondary | ICD-10-CM | POA: Insufficient documentation

## 2024-01-10 DIAGNOSIS — Z801 Family history of malignant neoplasm of trachea, bronchus and lung: Secondary | ICD-10-CM | POA: Insufficient documentation

## 2024-01-10 DIAGNOSIS — C7951 Secondary malignant neoplasm of bone: Secondary | ICD-10-CM | POA: Insufficient documentation

## 2024-01-10 DIAGNOSIS — E119 Type 2 diabetes mellitus without complications: Secondary | ICD-10-CM | POA: Insufficient documentation

## 2024-01-10 DIAGNOSIS — C61 Malignant neoplasm of prostate: Secondary | ICD-10-CM | POA: Insufficient documentation

## 2024-01-10 DIAGNOSIS — Z7984 Long term (current) use of oral hypoglycemic drugs: Secondary | ICD-10-CM | POA: Insufficient documentation

## 2024-01-10 DIAGNOSIS — F17210 Nicotine dependence, cigarettes, uncomplicated: Secondary | ICD-10-CM | POA: Insufficient documentation

## 2024-01-10 DIAGNOSIS — Z79899 Other long term (current) drug therapy: Secondary | ICD-10-CM | POA: Insufficient documentation

## 2024-01-10 DIAGNOSIS — Z7902 Long term (current) use of antithrombotics/antiplatelets: Secondary | ICD-10-CM | POA: Insufficient documentation

## 2024-01-10 DIAGNOSIS — Z510 Encounter for antineoplastic radiation therapy: Secondary | ICD-10-CM | POA: Insufficient documentation

## 2024-01-10 DIAGNOSIS — C7800 Secondary malignant neoplasm of unspecified lung: Secondary | ICD-10-CM | POA: Insufficient documentation

## 2024-01-10 NOTE — Progress Notes (Signed)
 Pt unsure of the meds he is taking.  PET 12/28/23. Pt asking how things are going for him?

## 2024-01-10 NOTE — Assessment & Plan Note (Addendum)
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 NGS-F-ONE 2022- NEGATIVE for any targets. # MARCH 2025- PET scan shows progression-although Lesions are subcentimeter in size; also PSA is rising. APRIL 2025-Taxotere  chemotherapy-every 3 weeks x 6 cycles. #s/p  Taxotere   cycle #6 - # OCT 22 nd, 2025-  Mild progression of metastatic prostate cancer with increased size and activity of small left lung nodules;  New metastatic lesion in the L4 vertebral body.  # Given the oligometastatic progression noted on above PET scan-patient currently is status post evaluation with radiation-proceed with SBRT as planned.  Discussed with brother Carlin regarding consideration of Pluvicto in the future.   # HTN- OFF telmisartan   [as per [facility]]- stable.   # Hot flashes- G-1sec to ADT- stable.    # RUL nodule- ~2-3cm; cystic [on PET AUG 2022]-  No new dominant mass, fluid collection or lymph node enlargement in the thorax. on most recent PET scan in OCT 2024.  Will monitor on chemotherapy-stable.     # DM-on metformin-oral hypoglycemic drugs- monitor closley on chemo/steroids- RBG- 151 again recommend compliance with glucometer- check 2-3 times a day-  # Weight loss:s/p  nutrition/Joli re: weight loss- improved.   # IV access:s/p  Mediport placement.  # ACP: FULL code.   *eliagrd 45 -08/08/2023- PSA- poor surrogate.   PS # DISPOSITION:   # Follow up in  2 months-- MD-;labs- cbc/cmp/PSA- port flush; eligard  - Dr.B  # I reviewed the blood work- with the patient in detail; also reviewed the imaging independently [as summarized above]; and with the patient in detail.

## 2024-01-10 NOTE — Addendum Note (Signed)
 Addended by: JOSHUA ALFONSO CROME on: 01/10/2024 11:39 AM   Modules accepted: Orders

## 2024-01-10 NOTE — Progress Notes (Signed)
 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   # NOV 2021- CTA [ER] Diffuse bilateral pulmonary nodules including a cavitary lesion in the right upper lobe measuring approximately 2 cm. There is extensive nodular pleural thickening bilaterally, greatest in the left lower lobe.  November 2021 PET scan-multiple lung lesions multiple bone lesions and prostate uptake.  February 2022-prostate biopsy [Dr.Brandon]   # DM-2; NO COPD [?]; ACTIVE SMOKER 65ppd.  # DEC 2nd week- FIRMAGON ; # JAN 12TH, 2022-ZYTIGA   1000 MG+ PRED 5MG ; FEB MID 2022- ELIGARD  q6M; MARCH 2022- DISCONTINUE ZYTIGA  sec to PRESS [severe hypokalemia/mental status changes hospitalization];  # MAY 6th, 2022- XTnadi 3pills/day - STOP APRIL 2025  # APRIL 22nd, 2025- Start taxoetere 60 mg/m2 - # 6 cycle on 10/10/2023- OCT 2025-mild progression noted new L4 lesion; status post evaluation with Dr. Camelia SBRT  # DEC 2025- consider Pluvicto.   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 No pathogenic variants identified on the  [facility] Multi-Cancer Panel. VUS in AXIN2 called c.1235A>C identified. The report date is 03/23/2020.   The Multi-Cancer Panel offered by [facility] includes sequencing and/or deletion duplication testing of the following 84 genes: AIP, ALK, APC, ATM, AXIN2,BAP1,  BARD1, BLM, BMPR1A, BRCA1, BRCA2, BRIP1, CASR, CDC73, CDH1, CDK4, CDKN1B, CDKN1C, CDKN2A (p14ARF), CDKN2A (p16INK4a), CEBPA, CHEK2, CTNNA1, DICER1, DIS3L2, EGFR (c.2369C>T, p.Thr790Met variant only), EPCAM (Deletion/duplication testing only), FH, FLCN, GATA2, GPC3, GREM1 (Promoter region deletion/duplication testing only), HOXB13 (c.251G>A, p.Gly84Glu), HRAS, KIT, MAX, MEN1, MET, MITF (c.952G>A, p.Glu318Lys variant only), MLH1, MSH2, MSH3, MSH6, MUTYH, NBN, NF1, NF2, NTHL1, PALB2, PDGFRA, PHOX2B, PMS2, POLD1, POLE, POT1, PRKAR1A, PTCH1, PTEN, RAD50, RAD51C, RAD51D, RB1, RECQL4, RET, RUNX1, SDHAF2, SDHA (sequence changes only), SDHB, SDHC, SDHD, SMAD4, SMARCA4, SMARCB1, SMARCE1, STK11, SUFU, TERC, TERT, TMEM127, TP53, TSC1, TSC2, VHL, WRN and WT1.    06/28/2023 -  Chemotherapy   Patient is on Treatment Plan : PROSTATE Docetaxel  (75) + Prednisone  q21d       HISTORY OF PRESENTING ILLNESS: Ambulating independently. Alone,   Brent Glass 83 y.o.  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  Given the concern for progression noted on the PET scan patient evaluated by radiation oncology.  He is considering SBRT  Patient is doing well. But has gained weight. Mild swelling in the legs.  No falls.  No fever no chills.  No constipation no diarrhea.  No nausea no vomiting. Denies any worsening pain. No tingling or numbness.   Review of Systems  Constitutional:  Positive  for malaise/fatigue. Negative for chills, diaphoresis and fever.  HENT:  Negative for nosebleeds and sore throat.   Eyes:  Negative for double vision.  Respiratory:  Negative for  hemoptysis, sputum production, shortness of breath and wheezing.   Cardiovascular:  Negative for chest pain, palpitations, orthopnea and leg swelling.  Gastrointestinal:  Negative for blood in stool, constipation, diarrhea, heartburn, melena, nausea and vomiting.  Genitourinary:  Negative for dysuria, frequency and urgency.  Musculoskeletal:  Positive for back pain and myalgias. Negative for joint pain.  Skin: Negative.  Negative for itching and rash.  Neurological:  Negative for dizziness, tingling, focal weakness, weakness and headaches.  Endo/Heme/Allergies:  Does not bruise/bleed easily.  Psychiatric/Behavioral:  Negative for depression. The patient is not nervous/anxious and does not have insomnia.      MEDICAL HISTORY:  Past Medical History:  Diagnosis Date   Arthritis    BPH (benign prostatic hyperplasia)    BPH with obstruction/lower urinary tract symptoms    Diabetes (HCC)    Disorder resulting from impaired renal function    Elevated PSA    Family history of lung cancer    Family history of lymphoma    Heart murmur    Hypertension    Knee pain    Lesion of lung    Low HDL (under 40)    Obesity    Urinary hesitancy     SURGICAL HISTORY: Past Surgical History:  Procedure Laterality Date   HERNIA REPAIR  2012   Umbilical Hernia Repair   IR IMAGING GUIDED PORT INSERTION  06/21/2023   KNEE ARTHROSCOPY Left 04/10/2015   Procedure: ARTHROSCOPY KNEE, partial medial meniscectomy, partial synovectomy;  Surgeon: Ozell Flake, MD;  Location: [facility] ORS;  Service: Orthopedics;  Laterality: Left;    SOCIAL HISTORY: Social History   Socioeconomic History   Marital status: Widowed    Spouse name: Not on file   Number of children: Not on file   Years of education: Not on file   Highest education level: Not on file  Occupational History   Not on file  Tobacco Use   Smoking status: Every Day    Current packs/day: 1.00    Types: Cigarettes   Smokeless tobacco: Never  Vaping  Use   Vaping status: Never Used  Substance and Sexual Activity   Alcohol use: No    Alcohol/week: 0.0 standard drinks of alcohol   Drug use: No   Sexual activity: Not Currently  Other Topics Concern   Not on file  Social History Narrative   Lives in [place]; self; daughter lives 1 mile. Smoke 1ppd > 65 years; stopped 25 years ago.  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    Social Drivers of Corporate Investment Banker Strain: Not on file  Food Insecurity: No Food Insecurity (07/27/2023)   Received from [facility]   Hunger Vital Sign    Within the past 12 months, you worried that your food would run out before you got the money to buy more.: Never true    Within the past 12 months, the food you bought just didn't last and you didn't have money to get more.: Never true  Transportation Needs: No Transportation Needs (07/27/2023)   Received from [facility] - Transportation    Lack of Transportation (Medical): No    Lack of Transportation (Non-Medical): No  Physical Activity: Not on file  Stress: Not on file  Social Connections: Moderately Integrated (04/08/2023)  Social Connection and Isolation Panel    Frequency of Communication with Friends and Family: More than three times a week    Frequency of Social Gatherings with Friends and Family: More than three times a week    Attends Religious Services: 1 to 4 times per year    Active Member of [employer] or Organizations: Yes    Attends Banker Meetings: 1 to 4 times per year    Marital Status: Widowed  Intimate Partner Violence: Not At Risk (04/08/2023)   Humiliation, Afraid, Rape, and Kick questionnaire    Fear of Current or Ex-Partner: No    Emotionally Abused: No    Physically Abused: No    Sexually Abused: No    FAMILY HISTORY: Family History  Problem Relation Age of Onset   Diabetes Mother    Lung cancer Mother    Lymphoma Son 28       died in 36.    Kidney disease  Neg Hx    Prostate cancer Neg Hx     ALLERGIES:  has no known allergies.  MEDICATIONS:  Current Outpatient Medications  Medication Sig Dispense Refill   acetaminophen  (TYLENOL ) 650 MG CR tablet Take 650 mg by mouth every 8 (eight) hours as needed for pain.     Blood Glucose Monitoring Suppl DEVI 1 each by Does not apply route in the morning, at noon, and at bedtime. May substitute to any manufacturer covered by patient's insurance. 1 each 0   clopidogrel  (PLAVIX ) 75 MG tablet Take 1 tablet (75 mg total) by mouth daily. 21 tablet 0   cyanocobalamin  2000 MCG tablet Take 1 tablet (2,000 mcg total) by mouth daily. 90 tablet 1   finasteride  (PROSCAR ) 5 MG tablet TAKE 1 TABLET BY MOUTH EVERY DAY 90 tablet 3   furosemide  (LASIX ) 20 MG tablet Take 1 tablet (20 mg total) by mouth daily. (Patient not taking: Reported on 12/19/2023) 15 tablet 0   GLUMETZA 1000 MG 24 hr tablet Take 1,000 mg by mouth 2 (two) times daily.     Lancets (FREESTYLE) lancets Use 2-3 times a day or as instructed 100 each 12   lidocaine -prilocaine  (EMLA ) cream Apply 1 Application topically as needed. Apply to port and cover with saran wrap 1-2 hours prior to port access 30 g 1   metFORMIN (GLUCOPHAGE) 1000 MG tablet Take 1,000 mg by mouth 2 (two) times daily with a meal.     ondansetron  (ZOFRAN ) 8 MG tablet Take 1 tablet (8 mg total) by mouth every 8 (eight) hours as needed for nausea or vomiting. 20 tablet 0   prochlorperazine  (COMPAZINE ) 10 MG tablet Take 1 tablet (10 mg total) by mouth every 6 (six) hours as needed for nausea or vomiting. 30 tablet 2   rosuvastatin  (CRESTOR ) 20 MG tablet Take 2 tablets (40 mg total) by mouth at bedtime. 60 tablet 6   telmisartan (MICARDIS) 20 MG tablet Take 1 tablet by mouth daily.     thiamine  100 MG tablet TAKE 1 TABLET BY MOUTH EVERY DAY 90 tablet 1   traMADol  (ULTRAM ) 50 MG tablet Take 1 tablet (50 mg total) by mouth daily as needed. (Patient not taking: Reported on 10/10/2023) 60 tablet 0    No current facility-administered medications for this visit.      SABRA  PHYSICAL EXAMINATION: ECOG PERFORMANCE STATUS: 0 - Asymptomatic  Vitals:   01/10/24 0954 01/10/24 1019  BP: (!) 140/67 (!) 131/53  Pulse: 75   Resp: 16   Temp: (!)  96.1 F (35.6 C)   SpO2: 100%         Filed Weights   01/10/24 0954  Weight: 215 lb 11.2 oz (97.8 kg)     Physical Exam HENT:     Head: Normocephalic and atraumatic.     Mouth/Throat:     Pharynx: No oropharyngeal exudate.  Eyes:     Pupils: Pupils are equal, round, and reactive to light.  Cardiovascular:     Rate and Rhythm: Normal rate and regular rhythm.  Pulmonary:     Effort: No respiratory distress.     Breath sounds: No wheezing.     Comments: Decreased breath sounds bilaterally. Abdominal:     General: Bowel sounds are normal. There is no distension.     Palpations: Abdomen is soft. There is no mass.     Tenderness: There is no abdominal tenderness. There is no guarding or rebound.  Musculoskeletal:        General: No tenderness. Normal range of motion.     Cervical back: Normal range of motion and neck supple.  Skin:    General: Skin is warm.  Neurological:     Mental Status: He is alert and oriented to person, place, and time.  Psychiatric:        Mood and Affect: Affect normal.      LABORATORY DATA:  I have reviewed the data as listed Lab Results  Component Value Date   WBC 10.6 (H) 12/19/2023   HGB 12.2 (L) 12/19/2023   HCT 38.1 (L) 12/19/2023   MCV 88.8 12/19/2023   PLT 316 12/19/2023   Recent Labs    09/19/23 0846 10/10/23 0758 12/19/23 0914  NA 137 137 136  K 3.7 3.6 3.7  CL 110 107 104  CO2 23 24 25   GLUCOSE 163* 151* 194*  BUN 13 14 13   CREATININE 0.98 0.95 1.10  CALCIUM  8.9 9.3 9.3  GFRNONAA >60 >60 >60  PROT 6.3* 6.4* 6.8  ALBUMIN 3.5 3.4* 3.6  AST 15 12* 17  ALT 8 7 9   ALKPHOS 88 84 99  BILITOT 0.6 0.5 0.7    Latest Reference Range & Units 01/23/20 12:22 03/10/20 14:11  03/19/20 09:19 04/16/20 09:10 05/14/20 10:06 06/13/20 09:26 07/11/20 10:13 08/07/20 09:24 09/04/20 10:13 09/16/20 13:59 10/21/20 13:59 12/23/20 13:27  Prostatic Specific Antigen 0.00 - 4.00 ng/mL 986.00 (H) 34.69 (H) 21.59 (H) 2.68 1.21 0.71 0.68 0.41 0.36 0.28 0.29 0.28  (H): Data is abnormally high  RADIOGRAPHIC STUDIES: I have personally reviewed the radiological images as listed and agreed with the findings in the report. NM PET (PSMA) SKULL TO MID THIGH Result Date: 12/28/2023 EXAM: PROSTATE PET SKULL BASE TO MID THIGHS 12/28/2023 03:04:25 PM TECHNIQUE: RADIOPHARMACEUTICAL: 8.25 mCi F-18 flotufolastaf (Posluma ) injected intravenously. PET imaging was obtained from skull vertex to mid thighs. Computed tomography was used for attenuation correction and localization. Fusion imaging was obtained. Uptake time  mins. COMPARISON: PET/CT Posluma  scan dated 12/2022. CLINICAL HISTORY: Prostate cancer. 10/10/23 Posluma  dose ordered #100 801 050-dso; 8.25 mCi Posluma  given via left hand IV; last PET/CT Posluma  scan = 12/2022; prostate cancer; prostate cancer metastatic to multiple sites; prostate cancer - Nov 2021; last chemo - August 2025; last PSA 12/19/23 - 6.83. FINDINGS: PROSTATE AND PROSTATE BED: Low-level activity throughout the prostate gland is not specific. LYMPH NODES: No PSMA avid lymph nodes. BONES: There is a focus of intense PSMA activity centrally within the L4 vertebral body with an SUV maximal of 21.6, with no  CT correlation. New metastatic lesion in the L4 vertebral body. Mild activity is associated with the anterior right lower rib on image 74 with an SUV maximal of 4.7 is similar to prior. OTHER PET FINDINGS: Focus of intense PSMA activity in the left upper lobe corresponds to a subpleural lesion measuring 13 mm on image 55. A small lung parenchymal nodule adjacent to the descending thoracic aorta in the left upper lobe measures 6 mm, also with intense activity with SUV maximal equal to 10.5. A  third small nodule is present in the left lower lobe medially, measuring 6 mm on image 69. Mild progression of metastatic prostate cancer with increased size and activity of small left lung nodules. IMPRESSION: 1. Mild progression of metastatic prostate cancer with increased size and activity of small left lung nodules. 2. New metastatic lesion in the L4 vertebral body. 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 NGS-F-ONE 2022- NEGATIVE for any targets. # MARCH 2025- PET scan shows progression-although Lesions are subcentimeter in size; also PSA is rising. APRIL 2025-Taxotere  chemotherapy-every 3 weeks x 6 cycles. #s/p  Taxotere   cycle #6 - # OCT 22 nd, 2025-  Mild progression of metastatic prostate cancer with increased size and activity of small left lung nodules;  New metastatic lesion in the L4 vertebral body.  # Given the oligometastatic progression noted on above PET scan-patient currently is status post evaluation with radiation-proceed with SBRT as planned.  Discussed with brother Carlin regarding consideration of Pluvicto in the future.   # HTN- OFF telmisartan   [as per [facility]]- stable.   # Hot flashes- G-1sec to ADT- stable.    # RUL nodule- ~2-3cm; cystic [on PET AUG 2022]-  No new dominant mass, fluid collection or lymph node enlargement in the thorax. on most recent PET scan in OCT 2024.  Will monitor on chemotherapy-stable.     # DM-on metformin-oral hypoglycemic drugs- monitor closley on chemo/steroids- RBG- 151 again recommend compliance with glucometer- check 2-3 times a day-  # Weight loss:s/p  nutrition/Joli re: weight loss- improved.   # IV access:s/p  Mediport placement.  # ACP: FULL code.   *eliagrd 44 -08/08/2023- PSA- poor surrogate.   PS #  DISPOSITION:   # Follow up in  2 months-- MD-;labs- cbc/cmp/PSA- port flush; eligard  - Dr.B  # I reviewed the blood work- with the patient in detail; also reviewed the imaging independently [as summarized above]; and with the patient in detail.    All questions were answered. The patient knows to call the clinic with any problems, questions or concerns.    Cindy JONELLE Joe, MD 01/10/2024 11:24 AM

## 2024-01-11 ENCOUNTER — Other Ambulatory Visit: Payer: Self-pay

## 2024-01-17 ENCOUNTER — Ambulatory Visit
Admission: RE | Admit: 2024-01-17 | Discharge: 2024-01-17 | Disposition: A | Discharge status: Home / Self Care | Source: Ambulatory Visit | Attending: Radiation Oncology | Admitting: Radiation Oncology

## 2024-01-17 ENCOUNTER — Other Ambulatory Visit: Payer: Self-pay

## 2024-01-17 LAB — RAD ONC ARIA SESSION SUMMARY
Course Elapsed Days: 0
Plan Fractions Treated to Date: 1
Plan Prescribed Dose Per Fraction: 6 Gy
Plan Total Fractions Prescribed: 5
Plan Total Prescribed Dose: 30 Gy
Reference Point Dosage Given to Date: 6 Gy
Reference Point Session Dosage Given: 6 Gy
Session Number: 1

## 2024-01-19 ENCOUNTER — Ambulatory Visit
Admission: RE | Admit: 2024-01-19 | Discharge: 2024-01-19 | Disposition: A | Discharge status: Home / Self Care | Source: Ambulatory Visit | Attending: Radiation Oncology | Admitting: Radiation Oncology

## 2024-01-19 ENCOUNTER — Other Ambulatory Visit: Payer: Self-pay

## 2024-01-19 LAB — RAD ONC ARIA SESSION SUMMARY
Course Elapsed Days: 2
Plan Fractions Treated to Date: 2
Plan Prescribed Dose Per Fraction: 6 Gy
Plan Total Fractions Prescribed: 5
Plan Total Prescribed Dose: 30 Gy
Reference Point Dosage Given to Date: 12 Gy
Reference Point Session Dosage Given: 6 Gy
Session Number: 2

## 2024-01-20 ENCOUNTER — Other Ambulatory Visit: Payer: Self-pay

## 2024-01-23 ENCOUNTER — Telehealth: Payer: Self-pay

## 2024-01-23 NOTE — Telephone Encounter (Signed)
 I haven't spoken to him about [facility] yet.  Do I need to call?

## 2024-01-23 NOTE — Telephone Encounter (Signed)
 Patient sister, Allena, calling with concerns of Mr. Decandia new bilateral leg pain from knees to groin since starting XRT 01/17/2024.  The pain is described as an all the time ache/throbbing that is not relieved with Tylenol .

## 2024-01-24 ENCOUNTER — Inpatient Hospital Stay (HOSPITAL_BASED_OUTPATIENT_CLINIC_OR_DEPARTMENT_OTHER): Admitting: Nurse Practitioner

## 2024-01-24 ENCOUNTER — Ambulatory Visit
Admission: RE | Admit: 2024-01-24 | Discharge: 2024-01-24 | Disposition: A | Discharge status: Home / Self Care | Source: Ambulatory Visit | Attending: Radiation Oncology | Admitting: Radiation Oncology

## 2024-01-24 ENCOUNTER — Other Ambulatory Visit: Payer: Self-pay

## 2024-01-24 ENCOUNTER — Encounter: Payer: Self-pay | Admitting: Nurse Practitioner

## 2024-01-24 VITALS — BP 114/47 | HR 83 | Temp 97.7°F | Resp 16 | Ht 72.0 in | Wt 213.6 lb

## 2024-01-24 DIAGNOSIS — C7951 Secondary malignant neoplasm of bone: Secondary | ICD-10-CM

## 2024-01-24 DIAGNOSIS — M545 Low back pain, unspecified: Secondary | ICD-10-CM

## 2024-01-24 DIAGNOSIS — C61 Malignant neoplasm of prostate: Secondary | ICD-10-CM | POA: Diagnosis not present

## 2024-01-24 LAB — RAD ONC ARIA SESSION SUMMARY
Course Elapsed Days: 7
Plan Fractions Treated to Date: 3
Plan Prescribed Dose Per Fraction: 6 Gy
Plan Total Fractions Prescribed: 5
Plan Total Prescribed Dose: 30 Gy
Reference Point Dosage Given to Date: 18 Gy
Reference Point Session Dosage Given: 6 Gy
Session Number: 3

## 2024-01-24 MED ORDER — TRAMADOL HCL 50 MG PO TABS: 50.0000 mg | ORAL_TABLET | Freq: Two times a day (BID) | ORAL | 1 refills | Status: AC | PRN

## 2024-01-24 MED ORDER — DEXAMETHASONE 2 MG PO TABS: 2.0000 mg | ORAL_TABLET | Freq: Every day | ORAL | 0 refills | Status: AC

## 2024-01-24 NOTE — Progress Notes (Signed)
 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   # NOV 2021- CTA [ER] Diffuse bilateral pulmonary nodules including a cavitary lesion in the right upper lobe measuring approximately 2 cm. There is extensive nodular pleural thickening bilaterally, greatest in the left lower lobe.  November 2021 PET scan-multiple lung lesions multiple bone lesions and prostate uptake.  February 2022-prostate biopsy [Dr.Brandon]   # DM-2; NO COPD [?]; ACTIVE SMOKER 65ppd.  # DEC 2nd week- FIRMAGON ; # JAN 12TH, 2022-ZYTIGA   1000 MG+ PRED 5MG ; FEB MID 2022- ELIGARD  q6M; MARCH 2022- DISCONTINUE ZYTIGA  sec to PRESS [severe hypokalemia/mental status changes hospitalization];  # MAY 6th, 2022- XTnadi 3pills/day - STOP APRIL 2025  # APRIL 22nd, 2025- Start taxoetere 60 mg/m2 - # 6 cycle on 10/10/2023- OCT 2025-mild progression noted new L4 lesion; status post evaluation with Dr. Camelia SBRT  # DEC 2025- consider Pluvicto.   # NGS/MOLECULAR TESTS: MARCH 2022-foundation 1 no targetable mutation./Genetic evaluation negative    # PALLIATIVE CARE EVALUATION:  # PAIN MANAGEMENT:    DIAGNOSIS: Prostate cancer  STAGE:   IV      ;  GOALS: Palliative  CURRENT/MOST RECENT THERAPY : ADT+ Xtandi     Prostate cancer metastatic to multiple sites [facility])  02/04/2020 Initial Diagnosis   Prostate cancer metastatic to multiple sites [facility])   03/19/2020 Cancer Staging   Staging form: Prostate, AJCC 8th Edition - Clinical: Stage IVB (pM1b) - Signed by Rennie Cindy SAUNDERS, MD on 03/19/2020    Genetic Testing   Negative genetic testing. No pathogenic variants identified on the  [facility] Multi-Cancer Panel. VUS in AXIN2 called c.1235A>C identified. The report date is 03/23/2020.   The Multi-Cancer Panel offered by [facility] includes sequencing and/or deletion duplication testing of the following 84 genes: AIP, ALK, APC, ATM, AXIN2,BAP1,  BARD1, BLM, BMPR1A, BRCA1, BRCA2, BRIP1, CASR, CDC73, CDH1, CDK4, CDKN1B, CDKN1C, CDKN2A (p14ARF), CDKN2A (p16INK4a), CEBPA, CHEK2, CTNNA1, DICER1, DIS3L2, EGFR (c.2369C>T, p.Thr790Met variant only), EPCAM (Deletion/duplication testing only), FH, FLCN, GATA2, GPC3, GREM1 (Promoter region deletion/duplication testing only), HOXB13 (c.251G>A, p.Gly84Glu), HRAS, KIT, MAX, MEN1, MET, MITF (c.952G>A, p.Glu318Lys variant only), MLH1, MSH2, MSH3, MSH6, MUTYH, NBN, NF1, NF2, NTHL1, PALB2, PDGFRA, PHOX2B, PMS2, POLD1, POLE, POT1, PRKAR1A, PTCH1, PTEN, RAD50, RAD51C, RAD51D, RB1, RECQL4, RET, RUNX1, SDHAF2, SDHA (sequence changes only), SDHB, SDHC, SDHD, SMAD4, SMARCA4, SMARCB1, SMARCE1, STK11, SUFU, TERC, TERT, TMEM127, TP53, TSC1, TSC2, VHL, WRN and WT1.    06/28/2023 -  Chemotherapy   Patient is on Treatment Plan : PROSTATE Docetaxel  (75) + Prednisone  q21d       HISTORY OF PRESENTING ILLNESS: Ambulating independently. Alone,   JAFARI MCKILLOP 83 y.o.  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  Given the concern for progression noted on the PET scan patient evaluated by radiation oncology, he is currently getting SBRT treatments to L4 bone lesion.  Patient reports overall doing well other than starting to have some lower back pain that radiates down both legs and stops at his knees.  He reports that pain is mild on time.  It has not benefited his mobility or balance.  Denies any saddle paresthesia or changes in bowel or bladder patterns no incontinence.  He reports that the pain is worse at night when he is trying to sleep.  No falls.  No fever no chills.   No constipation no diarrhea.  No nausea no vomiting. Denies any worsening pain. No tingling or numbness.   Review of Systems  Constitutional:  Positive for malaise/fatigue. Negative for chills, diaphoresis and fever.  HENT:  Negative for nosebleeds and sore throat.   Eyes:  Negative for double vision.  Respiratory:  Negative for hemoptysis, sputum production, shortness of breath and wheezing.   Cardiovascular:  Negative for chest pain, palpitations, orthopnea and leg swelling.  Gastrointestinal:  Negative for blood in stool, constipation, diarrhea, heartburn, melena, nausea and vomiting.  Genitourinary:  Negative for dysuria, frequency and urgency.  Musculoskeletal:  Positive for back pain and myalgias. Negative for joint pain.  Skin: Negative.  Negative for itching and rash.  Neurological:  Negative for dizziness, tingling, focal weakness, weakness and headaches.  Endo/Heme/Allergies:  Does not bruise/bleed easily.  Psychiatric/Behavioral:  Negative for depression. The patient is not nervous/anxious and does not have insomnia.      MEDICAL HISTORY:  Past Medical History:  Diagnosis Date   Arthritis    BPH (benign prostatic hyperplasia)    BPH with obstruction/lower urinary tract symptoms    Diabetes (HCC)    Disorder resulting from impaired renal function    Elevated PSA    Family history of lung cancer    Family history of lymphoma    Heart murmur    Hypertension    Knee pain    Lesion of lung    Low HDL (under 40)    Obesity    Urinary hesitancy     SURGICAL HISTORY: Past Surgical History:  Procedure Laterality Date   HERNIA REPAIR  2012   Umbilical Hernia Repair   IR IMAGING GUIDED PORT INSERTION  06/21/2023   KNEE ARTHROSCOPY Left 04/10/2015   Procedure: ARTHROSCOPY KNEE, partial medial meniscectomy, partial synovectomy;  Surgeon: Ozell Flake, MD;  Location: [facility] ORS;  Service: Orthopedics;  Laterality: Left;    SOCIAL HISTORY: Social History   Socioeconomic  History   Marital status: Widowed    Spouse name: Not on file   Number of children: Not on file   Years of education: Not on file   Highest education level: Not on file  Occupational History   Not on file  Tobacco Use   Smoking status: Every Day    Current packs/day: 1.00    Types: Cigarettes   Smokeless tobacco: Never  Vaping Use   Vaping status: Never Used  Substance and Sexual Activity   Alcohol use: No    Alcohol/week: 0.0 standard drinks of alcohol   Drug use: No   Sexual activity: Not Currently  Other Topics Concern   Not on file  Social History Narrative   Lives in [place]; self; daughter lives 1 mile. Smoke 1ppd > 65 years; stopped 25 years ago.  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    Social Drivers of Corporate Investment Banker Strain: Not on file  Food Insecurity: No Food Insecurity (07/27/2023)   Received from [facility]   Hunger Vital Sign    Within the past 12 months, you worried that your food would run out before you got the money to buy more.: Never true    Within the past 12 months, the food you bought just didn't  last and you didn't have money to get more.: Never true  Transportation Needs: No Transportation Needs (07/27/2023)   Received from [facility] - Transportation    Lack of Transportation (Medical): No    Lack of Transportation (Non-Medical): No  Physical Activity: Not on file  Stress: Not on file  Social Connections: Moderately Integrated (04/08/2023)   Social Connection and Isolation Panel    Frequency of Communication with Friends and Family: More than three times a week    Frequency of Social Gatherings with Friends and Family: More than three times a week    Attends Religious Services: 1 to 4 times per year    Active Member of [employer] or Organizations: Yes    Attends Banker Meetings: 1 to 4 times per year    Marital Status: Widowed  Intimate Partner Violence: Not At Risk  (04/08/2023)   Humiliation, Afraid, Rape, and Kick questionnaire    Fear of Current or Ex-Partner: No    Emotionally Abused: No    Physically Abused: No    Sexually Abused: No    FAMILY HISTORY: Family History  Problem Relation Age of Onset   Diabetes Mother    Lung cancer Mother    Lymphoma Son 28       died in 77.    Kidney disease Neg Hx    Prostate cancer Neg Hx     ALLERGIES:  has no known allergies.  MEDICATIONS:  Current Outpatient Medications  Medication Sig Dispense Refill   acetaminophen  (TYLENOL ) 650 MG CR tablet Take 650 mg by mouth every 8 (eight) hours as needed for pain.     Blood Glucose Monitoring Suppl DEVI 1 each by Does not apply route in the morning, at noon, and at bedtime. May substitute to any manufacturer covered by patient's insurance. 1 each 0   clopidogrel  (PLAVIX ) 75 MG tablet Take 1 tablet (75 mg total) by mouth daily. 21 tablet 0   cyanocobalamin  2000 MCG tablet Take 1 tablet (2,000 mcg total) by mouth daily. 90 tablet 1   dexamethasone  (DECADRON ) 2 MG tablet Take 1 tablet (2 mg total) by mouth daily for 5 doses. 5 tablet 0   finasteride  (PROSCAR ) 5 MG tablet TAKE 1 TABLET BY MOUTH EVERY DAY 90 tablet 3   furosemide  (LASIX ) 20 MG tablet Take 1 tablet (20 mg total) by mouth daily. 15 tablet 0   GLUMETZA 1000 MG 24 hr tablet Take 1,000 mg by mouth 2 (two) times daily.     Lancets (FREESTYLE) lancets Use 2-3 times a day or as instructed 100 each 12   lidocaine -prilocaine  (EMLA ) cream Apply 1 Application topically as needed. Apply to port and cover with saran wrap 1-2 hours prior to port access 30 g 1   metFORMIN (GLUCOPHAGE) 1000 MG tablet Take 1,000 mg by mouth 2 (two) times daily with a meal.     ondansetron  (ZOFRAN ) 8 MG tablet Take 1 tablet (8 mg total) by mouth every 8 (eight) hours as needed for nausea or vomiting. 20 tablet 0   prochlorperazine  (COMPAZINE ) 10 MG tablet Take 1 tablet (10 mg total) by mouth every 6 (six) hours as needed for nausea  or vomiting. 30 tablet 2   rosuvastatin  (CRESTOR ) 20 MG tablet Take 2 tablets (40 mg total) by mouth at bedtime. 60 tablet 6   telmisartan (MICARDIS) 20 MG tablet Take 1 tablet by mouth daily.     thiamine  100 MG tablet TAKE 1 TABLET BY  MOUTH EVERY DAY 90 tablet 1   traMADol  (ULTRAM ) 50 MG tablet Take 1 tablet (50 mg total) by mouth every 12 (twelve) hours as needed for up to 60 doses. 30 tablet 1   No current facility-administered medications for this visit.      SABRA  PHYSICAL EXAMINATION: ECOG PERFORMANCE STATUS: 0 - Asymptomatic  Vitals:   01/24/24 0857  BP: (!) 114/47  Pulse: 83  Resp: 16  Temp: 97.7 F (36.5 C)        Filed Weights   01/24/24 0857  Weight: 213 lb 9.6 oz (96.9 kg)     Physical Exam HENT:     Head: Normocephalic and atraumatic.     Mouth/Throat:     Pharynx: No oropharyngeal exudate.  Eyes:     Pupils: Pupils are equal, round, and reactive to light.  Cardiovascular:     Rate and Rhythm: Normal rate and regular rhythm.  Pulmonary:     Effort: No respiratory distress.     Breath sounds: No wheezing.     Comments: Decreased breath sounds bilaterally. Abdominal:     General: Bowel sounds are normal. There is no distension.     Palpations: Abdomen is soft. There is no mass.     Tenderness: There is no abdominal tenderness. There is no guarding or rebound.  Musculoskeletal:        General: No tenderness. Normal range of motion.     Cervical back: Normal range of motion and neck supple.     Comments: Intermittent lower back pain that radiates down both legs to knees  Skin:    General: Skin is warm.  Neurological:     Mental Status: He is alert and oriented to person, place, and time.  Psychiatric:        Mood and Affect: Affect normal.      LABORATORY DATA:  I have reviewed the data as listed Lab Results  Component Value Date   WBC 10.6 (H) 12/19/2023   HGB 12.2 (L) 12/19/2023   HCT 38.1 (L) 12/19/2023   MCV 88.8 12/19/2023   PLT 316  12/19/2023   Recent Labs    09/19/23 0846 10/10/23 0758 12/19/23 0914  NA 137 137 136  K 3.7 3.6 3.7  CL 110 107 104  CO2 23 24 25   GLUCOSE 163* 151* 194*  BUN 13 14 13   CREATININE 0.98 0.95 1.10  CALCIUM  8.9 9.3 9.3  GFRNONAA >60 >60 >60  PROT 6.3* 6.4* 6.8  ALBUMIN 3.5 3.4* 3.6  AST 15 12* 17  ALT 8 7 9   ALKPHOS 88 84 99  BILITOT 0.6 0.5 0.7    Latest Reference Range & Units 01/23/20 12:22 03/10/20 14:11 03/19/20 09:19 04/16/20 09:10 05/14/20 10:06 06/13/20 09:26 07/11/20 10:13 08/07/20 09:24 09/04/20 10:13 09/16/20 13:59 10/21/20 13:59 12/23/20 13:27  Prostatic Specific Antigen 0.00 - 4.00 ng/mL 986.00 (H) 34.69 (H) 21.59 (H) 2.68 1.21 0.71 0.68 0.41 0.36 0.28 0.29 0.28  (H): Data is abnormally high  RADIOGRAPHIC STUDIES: I have personally reviewed the radiological images as listed and agreed with the findings in the report. NM PET (PSMA) SKULL TO MID THIGH Result Date: 12/28/2023 EXAM: PROSTATE PET SKULL BASE TO MID THIGHS 12/28/2023 03:04:25 PM TECHNIQUE: RADIOPHARMACEUTICAL: 8.25 mCi F-18 flotufolastaf (Posluma ) injected intravenously. PET imaging was obtained from skull vertex to mid thighs. Computed tomography was used for attenuation correction and localization. Fusion imaging was obtained. Uptake time  mins. COMPARISON: PET/CT Posluma  scan dated 12/2022. CLINICAL HISTORY: Prostate cancer.  10/10/23 Posluma  dose ordered #100 801 050-dso; 8.25 mCi Posluma  given via left hand IV; last PET/CT Posluma  scan = 12/2022; prostate cancer; prostate cancer metastatic to multiple sites; prostate cancer - Nov 2021; last chemo - August 2025; last PSA 12/19/23 - 6.83. FINDINGS: PROSTATE AND PROSTATE BED: Low-level activity throughout the prostate gland is not specific. LYMPH NODES: No PSMA avid lymph nodes. BONES: There is a focus of intense PSMA activity centrally within the L4 vertebral body with an SUV maximal of 21.6, with no CT correlation. New metastatic lesion in the L4 vertebral body.  Mild activity is associated with the anterior right lower rib on image 74 with an SUV maximal of 4.7 is similar to prior. OTHER PET FINDINGS: Focus of intense PSMA activity in the left upper lobe corresponds to a subpleural lesion measuring 13 mm on image 55. A small lung parenchymal nodule adjacent to the descending thoracic aorta in the left upper lobe measures 6 mm, also with intense activity with SUV maximal equal to 10.5. A third small nodule is present in the left lower lobe medially, measuring 6 mm on image 69. Mild progression of metastatic prostate cancer with increased size and activity of small left lung nodules. IMPRESSION: 1. Mild progression of metastatic prostate cancer with increased size and activity of small left lung nodules. 2. New metastatic lesion in the L4 vertebral body. 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 NGS-F-ONE 2022- NEGATIVE for any targets. # MARCH 2025- PET scan shows progression-although Lesions are subcentimeter in size; also PSA is rising. APRIL 2025-Taxotere  chemotherapy-every 3 weeks x 6 cycles. #s/p  Taxotere   cycle #6 - # OCT 22 nd, 2025-  Mild progression of metastatic prostate cancer with increased size and activity of small left lung nodules;  New metastatic lesion in the L4 vertebral body currently getting 5 treatments of SBRT he is on treatment 3 of 5 at this time   - lower back pain that radiate to bilateral thighs and knees: Patient orts that since starting radiation therapy he is having aching in his lower back at night that radiates down through both thighs and needs.  He reports that he gets uncomfortable and sometimes causes him to lose sleep.  Unable to find a comfortable position at times.  Today he walked them without  assistance sitting in exam room in chair with legs crossed he denies any pain during the time of visit.  He does not he denies any new incontinence, no sensory changes, no changes in balance, no recent falls or injuries, no pinpoint tenderness today.  I suspect that this is inflammation from radiation we will proceed with treatment with low-dose Decadron  tablets for 5 days only.  He also was not utilizing his tramadol  that was previously ordered ( new prescription sent this visit).  Instructed him to take tramadol  in combination with Tylenol  to help for better pain control and he verbalizes understanding.  Instructed him to call back with any new or worsening pain or changes in sensation.  If pain continues after radiation we could consider repeat imaging at that time patient in agreement with plan of care.   -DM-on metformin-oral hypoglycemic drugs- monitor closley on while on steroids- encouraged him to check blood sugar 2-3 times a day    F/U Plan:  F/U as scheduled    All questions were answered. The patient knows to call the clinic with any problems, questions or concerns.    Morna Husband, NP 01/24/2024 11:15 AM

## 2024-01-26 ENCOUNTER — Ambulatory Visit
Admission: RE | Admit: 2024-01-26 | Discharge: 2024-01-26 | Disposition: A | Discharge status: Home / Self Care | Source: Ambulatory Visit | Attending: Radiation Oncology | Admitting: Radiation Oncology

## 2024-01-26 ENCOUNTER — Other Ambulatory Visit: Payer: Self-pay

## 2024-01-26 LAB — RAD ONC ARIA SESSION SUMMARY
Course Elapsed Days: 9
Plan Fractions Treated to Date: 4
Plan Prescribed Dose Per Fraction: 6 Gy
Plan Total Fractions Prescribed: 5
Plan Total Prescribed Dose: 30 Gy
Reference Point Dosage Given to Date: 24 Gy
Reference Point Session Dosage Given: 6 Gy
Session Number: 4

## 2024-01-31 ENCOUNTER — Ambulatory Visit
Admission: RE | Admit: 2024-01-31 | Discharge: 2024-01-31 | Disposition: A | Discharge status: Home / Self Care | Source: Ambulatory Visit | Attending: Radiation Oncology | Admitting: Radiation Oncology

## 2024-01-31 ENCOUNTER — Other Ambulatory Visit: Payer: Self-pay

## 2024-01-31 LAB — RAD ONC ARIA SESSION SUMMARY
Course Elapsed Days: 14
Plan Fractions Treated to Date: 5
Plan Prescribed Dose Per Fraction: 6 Gy
Plan Total Fractions Prescribed: 5
Plan Total Prescribed Dose: 30 Gy
Reference Point Dosage Given to Date: 30 Gy
Reference Point Session Dosage Given: 6 Gy
Session Number: 5

## 2024-02-01 ENCOUNTER — Other Ambulatory Visit: Payer: Self-pay

## 2024-02-01 NOTE — Radiation Completion Notes (Signed)
 Patient Name: Brent Glass, Brent Glass MRN: [id number] Date of Birth: 1940/11/02 Referring Physician: BABETTE MONARCH, M.D. Date of Service: 2024-02-01 Radiation Oncologist: Marcey Penton, M.D. [facility]                             RADIATION ONCOLOGY END OF TREATMENT NOTE     Diagnosis: C79.51 Secondary malignant neoplasm of bone Intent: Palliative     HPI: Patient is an 83 year old male presented in November 2021 with stage IV prostate cancer with metastatic disease to both lung and bone.  He has bilateral pulmonary nodules.  He has been treated with Firmagon  and Eligard  as well as Xtandi  in the past.  More recently he has been on Taxotere  with a 6 cycle completed in August 2025.  He had a recent PSMA PET scan showing mild progression of metastatic prostate cancer with increased size and activity of small left lung nodules and a new metastatic lesion in the L4 vertebral body.  He is asymptomatic specifically denies any problems with ambulation any lower back pain.  He is having no pulmonary symptoms.      ==========DELIVERED PLANS==========  First Treatment Date: 2024-01-17 Last Treatment Date: 2024-01-31   Plan Name: Spine_L Site: Lumbar Spine Technique: SBRT/SRT-IMRT Mode: Photon Dose Per Fraction: 6 Gy Prescribed Dose (Delivered / Prescribed): 30 Gy / 30 Gy Prescribed Fxs (Delivered / Prescribed): 5 / 5     ==========ON TREATMENT VISIT DATES========== 2024-01-17, 2024-01-19, 2024-01-24, 2024-01-24, 2024-01-26, 2024-01-31     ==========UPCOMING VISITS========== 03/12/2024 [facility] RAD ONCOLOGY FOLLOW UP 30 Penton Marcey, MD  03/12/2024 [facility] MED ONC EST PT Rennie Cindy SAUNDERS, MD  03/12/2024 [facility] MED ONC INF PORT FLUSH W/LAB [facility]-PORT FLUSH        ==========APPENDIX - ON TREATMENT VISIT NOTES==========   See weekly On Treatment Notes in Epic for details in the Media tab (listed as Progress notes on the On Treatment Visit Dates  listed above).

## 2024-03-05 ENCOUNTER — Ambulatory Visit: Admitting: Radiation Oncology

## 2024-03-12 ENCOUNTER — Inpatient Hospital Stay: Payer: Medicare (Managed Care) | Attending: Internal Medicine

## 2024-03-12 ENCOUNTER — Encounter: Payer: Self-pay | Admitting: Internal Medicine

## 2024-03-12 ENCOUNTER — Inpatient Hospital Stay: Payer: Medicare (Managed Care)

## 2024-03-12 ENCOUNTER — Ambulatory Visit
Admission: RE | Admit: 2024-03-12 | Discharge: 2024-03-12 | Disposition: A | Discharge status: Home / Self Care | Payer: Medicare (Managed Care) | Source: Ambulatory Visit | Attending: Radiation Oncology | Admitting: Radiation Oncology

## 2024-03-12 ENCOUNTER — Inpatient Hospital Stay: Payer: Medicare (Managed Care) | Admitting: Internal Medicine

## 2024-03-12 VITALS — BP 148/69 | HR 72 | Temp 97.8°F | Resp 16 | Ht 72.0 in | Wt 220.4 lb

## 2024-03-12 DIAGNOSIS — C7951 Secondary malignant neoplasm of bone: Secondary | ICD-10-CM | POA: Insufficient documentation

## 2024-03-12 DIAGNOSIS — F17210 Nicotine dependence, cigarettes, uncomplicated: Secondary | ICD-10-CM | POA: Diagnosis not present

## 2024-03-12 DIAGNOSIS — C7800 Secondary malignant neoplasm of unspecified lung: Secondary | ICD-10-CM | POA: Insufficient documentation

## 2024-03-12 DIAGNOSIS — C61 Malignant neoplasm of prostate: Secondary | ICD-10-CM

## 2024-03-12 DIAGNOSIS — Z923 Personal history of irradiation: Secondary | ICD-10-CM | POA: Insufficient documentation

## 2024-03-12 LAB — CBC WITH DIFFERENTIAL (CANCER CENTER ONLY)
Abs Immature Granulocytes: 0.03 K/uL (ref 0.00–0.07)
Basophils Absolute: 0.1 K/uL (ref 0.0–0.1)
Basophils Relative: 1 %
Eosinophils Absolute: 0.2 K/uL (ref 0.0–0.5)
Eosinophils Relative: 2 %
HCT: 35.9 % — ABNORMAL LOW (ref 39.0–52.0)
Hemoglobin: 11.7 g/dL — ABNORMAL LOW (ref 13.0–17.0)
Immature Granulocytes: 0 %
Lymphocytes Relative: 34 %
Lymphs Abs: 2.8 K/uL (ref 0.7–4.0)
MCH: 28.4 pg (ref 26.0–34.0)
MCHC: 32.6 g/dL (ref 30.0–36.0)
MCV: 87.1 fL (ref 80.0–100.0)
Monocytes Absolute: 0.8 K/uL (ref 0.1–1.0)
Monocytes Relative: 10 %
Neutro Abs: 4.4 K/uL (ref 1.7–7.7)
Neutrophils Relative %: 53 %
Platelet Count: 269 K/uL (ref 150–400)
RBC: 4.12 MIL/uL — ABNORMAL LOW (ref 4.22–5.81)
RDW: 16.1 % — ABNORMAL HIGH (ref 11.5–15.5)
WBC Count: 8.2 K/uL (ref 4.0–10.5)
nRBC: 0 % (ref 0.0–0.2)

## 2024-03-12 LAB — CMP (CANCER CENTER ONLY)
ALT: 5 U/L (ref 0–44)
AST: 12 U/L — ABNORMAL LOW (ref 15–41)
Albumin: 4 g/dL (ref 3.5–5.0)
Alkaline Phosphatase: 98 U/L (ref 38–126)
Anion gap: 9 (ref 5–15)
BUN: 11 mg/dL (ref 8–23)
CO2: 25 mmol/L (ref 22–32)
Calcium: 9.6 mg/dL (ref 8.9–10.3)
Chloride: 102 mmol/L (ref 98–111)
Creatinine: 1.07 mg/dL (ref 0.61–1.24)
GFR, Estimated: >60 mL/min
Glucose, Bld: 219 mg/dL — ABNORMAL HIGH (ref 70–99)
Potassium: 3.9 mmol/L (ref 3.5–5.1)
Sodium: 135 mmol/L (ref 135–145)
Total Bilirubin: 0.3 mg/dL (ref 0.0–1.2)
Total Protein: 6.6 g/dL (ref 6.5–8.1)

## 2024-03-12 LAB — PSA: Prostatic Specific Antigen: 13.7 ng/mL — ABNORMAL HIGH (ref 0.00–4.00)

## 2024-03-12 MED ORDER — ERGOCALCIFEROL 1.25 MG (50000 UT) PO CAPS: 50000.0000 [IU] | ORAL_CAPSULE | ORAL | 1 refills | Status: AC

## 2024-03-12 MED ORDER — LEUPROLIDE ACETATE (6 MONTH) 45 MG ~~LOC~~ KIT
45.0000 mg | PACK | Freq: Once | SUBCUTANEOUS | Status: AC
  Administered 2024-03-12: 45 mg via SUBCUTANEOUS
  Filled 2024-03-12: qty 45

## 2024-03-12 NOTE — Progress Notes (Signed)
 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   # NOV 2021- CTA [ER] Diffuse bilateral pulmonary nodules including a cavitary lesion in the right upper lobe measuring approximately 2 cm. There is extensive nodular pleural thickening bilaterally, greatest in the left lower lobe.  November 2021 PET scan-multiple lung lesions multiple bone lesions and prostate uptake.  February 2022-prostate biopsy [Dr.Brandon]   # DM-2; NO COPD [?]; ACTIVE SMOKER 65ppd.  # DEC 2nd week- FIRMAGON ; # JAN 12TH, 2022-ZYTIGA   1000 MG+ PRED 5MG ; FEB MID 2022- ELIGARD  q6M; MARCH 2022- DISCONTINUE ZYTIGA  sec to PRESS [severe hypokalemia/mental status changes hospitalization];  # MAY 6th, 2022- XTnadi 3pills/day - STOP APRIL 2025  # APRIL 22nd, 2025- Start taxoetere 60 mg/m2 - # 6 cycle on 10/10/2023- OCT 2025-mild progression noted new L4 lesion; status post evaluation with Dr. 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 No pathogenic variants identified on the [facility] Multi-Cancer Panel. VUS  in AXIN2 called c.1235A>C identified. The report date is 03/23/2020.   The Multi-Cancer Panel offered by [facility] includes sequencing and/or deletion duplication testing of the following 84 genes: AIP, ALK, APC, ATM, AXIN2,BAP1,  BARD1, BLM, BMPR1A, BRCA1, BRCA2, BRIP1, CASR, CDC73, CDH1, CDK4, CDKN1B, CDKN1C, CDKN2A (p14ARF), CDKN2A (p16INK4a), CEBPA, CHEK2, CTNNA1, DICER1, DIS3L2, EGFR (c.2369C>T, p.Thr790Met variant only), EPCAM (Deletion/duplication testing only), FH, FLCN, GATA2, GPC3, GREM1 (Promoter region deletion/duplication testing only), HOXB13 (c.251G>A, p.Gly84Glu), HRAS, KIT, MAX, MEN1, MET, MITF (c.952G>A, p.Glu318Lys variant only), MLH1, MSH2, MSH3, MSH6, MUTYH, NBN, NF1, NF2, NTHL1, PALB2, PDGFRA, PHOX2B, PMS2, POLD1, POLE, POT1, PRKAR1A, PTCH1, PTEN, RAD50, RAD51C, RAD51D, RB1, RECQL4, RET, RUNX1, SDHAF2, SDHA (sequence changes only), SDHB, SDHC, SDHD, SMAD4, SMARCA4, SMARCB1, SMARCE1, STK11, SUFU, TERC, TERT, TMEM127, TP53, TSC1, TSC2, VHL, WRN and WT1.    06/28/2023 -  Chemotherapy   Patient is on Treatment Plan : PROSTATE Docetaxel  (75) + Prednisone  q21d       HISTORY OF PRESENTING ILLNESS: Ambulating independently. Alone,   DELRICK DEHART 84 y.o.  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  History of Present Illness   NEIL BRICKELL is an 84 year old male with metastatic prostate cancer to bone who presents for oncology follow-up and scheduled injection.  He completed radiation therapy to the back several months ago for bone metastasis, which was associated with the onset of back pain. The pain resolved upon completion of radiation, and he is currently  pain-free.  Recent imaging in October prior to radiation showed slightly rising cancer markers.  He maintains a good appetite, notes weight  gain, and denies any new or concerning symptoms. He has not traveled recently and remained at home during the holidays. He takes a daily multivitamin but is unsure of its contents and does not take additional calcium  or vitamin D  supplements. He has dentures and has not seen a dentist since his placement; dental clearance was discussed for potential bone-strengthening therapy with Zometa.       Review of Systems  Constitutional:  Positive for malaise/fatigue. Negative for chills, diaphoresis and fever.  HENT:  Negative for nosebleeds and sore throat.   Eyes:  Negative for double vision.  Respiratory:  Negative for hemoptysis, sputum production, shortness of breath and wheezing.   Cardiovascular:  Negative for chest pain, palpitations, orthopnea and leg swelling.  Gastrointestinal:  Negative for blood in stool, constipation, diarrhea, heartburn, melena, nausea and vomiting.  Genitourinary:  Negative for dysuria, frequency and urgency.  Musculoskeletal:  Positive for back pain and myalgias. Negative for joint pain.  Skin: Negative.  Negative for itching and rash.  Neurological:  Negative for dizziness, tingling, focal weakness, weakness and headaches.  Endo/Heme/Allergies:  Does not bruise/bleed easily.  Psychiatric/Behavioral:  Negative for depression. The patient is not nervous/anxious and does not have insomnia.      MEDICAL HISTORY:  Past Medical History:  Diagnosis Date   Arthritis    BPH (benign prostatic hyperplasia)    BPH with obstruction/lower urinary tract symptoms    Diabetes (HCC)    Disorder resulting from impaired renal function    Elevated PSA    Family history of lung cancer    Family history of lymphoma    Heart murmur    Hypertension    Knee pain    Lesion of lung    Low HDL (under 40)    Obesity    Urinary hesitancy     SURGICAL HISTORY: Past Surgical History:  Procedure Laterality Date   HERNIA REPAIR  2012   Umbilical Hernia Repair   IR IMAGING GUIDED  PORT INSERTION  06/21/2023   KNEE ARTHROSCOPY Left 04/10/2015   Procedure: ARTHROSCOPY KNEE, partial medial meniscectomy, partial synovectomy;  Surgeon: Ozell Flake, MD;  Location: [facility] ORS;  Service: Orthopedics;  Laterality: Left;    SOCIAL HISTORY: Social History   Socioeconomic History   Marital status: Widowed    Spouse name: Not on file   Number of children: Not on file   Years of education: Not on file   Highest education level: Not on file  Occupational History   Not on file  Tobacco Use   Smoking status: Every Day    Current packs/day: 1.00    Types: Cigarettes   Smokeless tobacco: Never  Vaping Use   Vaping status: Never Used  Substance and Sexual Activity   Alcohol use: No    Alcohol/week: 0.0 standard drinks of alcohol   Drug use: No   Sexual activity: Not Currently  Other Topics Concern   Not on file  Social History Narrative   Lives in [place]; self; daughter lives 1 mile. Smoke 1ppd > 65 years; stopped 25 years ago.  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    Social Drivers of Health   Tobacco Use: High Risk (03/12/2024)   Patient History    Smoking Tobacco Use: Every Day    Smokeless Tobacco Use: Never    Passive Exposure: Not on  Actuary Strain: Not on file  Food Insecurity: No Food Insecurity (07/27/2023)   [facility]   Epic    Within the past 12 months, you worried that your food would run out before you got the money to buy more.: Never true    Within the past 12 months, the food you bought just didn't last and you didn't have money to get more.: Never true  Transportation Needs: No Transportation Needs (07/27/2023)   Received from [facility] - Transportation    Lack of Transportation (Medical): No    Lack of Transportation (Non-Medical): No  Physical Activity: Not on file  Stress: Not on file  Social Connections: Moderately Integrated (04/08/2023)   Social Connection and  Isolation Panel    Frequency of Communication with Friends and Family: More than three times a week    Frequency of Social Gatherings with Friends and Family: More than three times a week    Attends Religious Services: 1 to 4 times per year    Active Member of [employer] or Organizations: Yes    Attends Banker Meetings: 1 to 4 times per year    Marital Status: Widowed  Intimate Partner Violence: Not At Risk (04/08/2023)   Humiliation, Afraid, Rape, and Kick questionnaire    Fear of Current or Ex-Partner: No    Emotionally Abused: No    Physically Abused: No    Sexually Abused: No  Depression (PHQ2-9): Low Risk (03/12/2024)   Depression (PHQ2-9)    PHQ-2 Score: 0  Alcohol Screen: Not on file  Housing: Low Risk (04/08/2023)   Housing Stability Vital Sign    Unable to Pay for Housing in the Last Year: No    Number of Times Moved in the Last Year: 0    Homeless in the Last Year: No  Utilities: Low Risk (07/27/2023)   Received from [facility]   Utilities    Within the past 12 months, have you been unable to get utilities(heat, electricity) when it was really needed?: No  Health Literacy: Not on file    FAMILY HISTORY: Family History  Problem Relation Age of Onset   Diabetes Mother    Lung cancer Mother    Lymphoma Son 28       died in 20.    Kidney disease Neg Hx    Prostate cancer Neg Hx     ALLERGIES:  has no known allergies.  MEDICATIONS:  Current Outpatient Medications  Medication Sig Dispense Refill   acetaminophen  (TYLENOL ) 650 MG CR tablet Take 650 mg by mouth every 8 (eight) hours as needed for pain.     Blood Glucose Monitoring Suppl DEVI 1 each by Does not apply route in the morning, at noon, and at bedtime. May substitute to any manufacturer covered by patient's insurance. 1 each 0   ergocalciferol  (VITAMIN D2) 1.25 MG (50000 UT) capsule Take 1 capsule (50,000 Units total) by mouth once a week. 12 capsule 1   Lancets (FREESTYLE) lancets Use 2-3 times a  day or as instructed 100 each 12   lidocaine -prilocaine  (EMLA ) cream Apply 1 Application topically as needed. Apply to port and cover with saran wrap 1-2 hours prior to port access 30 g 1   ondansetron  (ZOFRAN ) 8 MG tablet Take 1 tablet (8 mg total) by mouth every 8 (eight) hours as needed for nausea or vomiting. 20 tablet 0   prochlorperazine  (COMPAZINE ) 10 MG tablet Take 1  tablet (10 mg total) by mouth every 6 (six) hours as needed for nausea or vomiting. 30 tablet 2   clopidogrel  (PLAVIX ) 75 MG tablet Take 1 tablet (75 mg total) by mouth daily. 21 tablet 0   cyanocobalamin  2000 MCG tablet Take 1 tablet (2,000 mcg total) by mouth daily. 90 tablet 1   finasteride  (PROSCAR ) 5 MG tablet TAKE 1 TABLET BY MOUTH EVERY DAY 90 tablet 3   furosemide  (LASIX ) 20 MG tablet Take 1 tablet (20 mg total) by mouth daily. 15 tablet 0   GLUMETZA 1000 MG 24 hr tablet Take 1,000 mg by mouth 2 (two) times daily.     metFORMIN (GLUCOPHAGE) 1000 MG tablet Take 1,000 mg by mouth 2 (two) times daily with a meal.     rosuvastatin  (CRESTOR ) 20 MG tablet Take 2 tablets (40 mg total) by mouth at bedtime. 60 tablet 6   telmisartan (MICARDIS) 20 MG tablet Take 1 tablet by mouth daily.     thiamine  100 MG tablet TAKE 1 TABLET BY MOUTH EVERY DAY 90 tablet 1   traMADol  (ULTRAM ) 50 MG tablet Take 1 tablet (50 mg total) by mouth every 12 (twelve) hours as needed for up to 60 doses. 30 tablet 1   No current facility-administered medications for this visit.      SABRA  PHYSICAL EXAMINATION: ECOG PERFORMANCE STATUS: 0 - Asymptomatic  Vitals:   03/12/24 1007 03/12/24 1024  BP: (!) 151/75 (!) 148/69  Pulse: 72   Resp: 16   Temp: 97.8 F (36.6 C)   SpO2: 100%         Filed Weights   03/12/24 1007  Weight: 220 lb 6.4 oz (100 kg)     Physical Exam HENT:     Head: Normocephalic and atraumatic.     Mouth/Throat:     Pharynx: No oropharyngeal exudate.  Eyes:     Pupils: Pupils are equal, round, and reactive to  light.  Cardiovascular:     Rate and Rhythm: Normal rate and regular rhythm.  Pulmonary:     Effort: No respiratory distress.     Breath sounds: No wheezing.     Comments: Decreased breath sounds bilaterally. Abdominal:     General: Bowel sounds are normal. There is no distension.     Palpations: Abdomen is soft. There is no mass.     Tenderness: There is no abdominal tenderness. There is no guarding or rebound.  Musculoskeletal:        General: No tenderness. Normal range of motion.     Cervical back: Normal range of motion and neck supple.  Skin:    General: Skin is warm.  Neurological:     Mental Status: He is alert and oriented to person, place, and time.  Psychiatric:        Mood and Affect: Affect normal.      LABORATORY DATA:  I have reviewed the data as listed Lab Results  Component Value Date   WBC 8.2 03/12/2024   HGB 11.7 (L) 03/12/2024   HCT 35.9 (L) 03/12/2024   MCV 87.1 03/12/2024   PLT 269 03/12/2024   Recent Labs    10/10/23 0758 12/19/23 0914 03/12/24 1009  NA 137 136 135  K 3.6 3.7 3.9  CL 107 104 102  CO2 24 25 25   GLUCOSE 151* 194* 219*  BUN 14 13 11   CREATININE 0.95 1.10 1.07  CALCIUM  9.3 9.3 9.6  GFRNONAA >60 >60 >60  PROT 6.4* 6.8 6.6  ALBUMIN  3.4* 3.6 4.0  AST 12* 17 12*  ALT 7 9 5   ALKPHOS 84 99 98  BILITOT 0.5 0.7 0.3    Latest Reference Range & Units 01/23/20 12:22 03/10/20 14:11 03/19/20 09:19 04/16/20 09:10 05/14/20 10:06 06/13/20 09:26 07/11/20 10:13 08/07/20 09:24 09/04/20 10:13 09/16/20 13:59 10/21/20 13:59 12/23/20 13:27  Prostatic Specific Antigen 0.00 - 4.00 ng/mL 986.00 (H) 34.69 (H) 21.59 (H) 2.68 1.21 0.71 0.68 0.41 0.36 0.28 0.29 0.28  (H): Data is abnormally high  RADIOGRAPHIC STUDIES: I have personally reviewed the radiological images as listed and agreed with the findings in the report. No results found.    ASSESSMENT & PLAN:   Prostate cancer metastatic to multiple sites [facility]) # prostate cancer castrate  resistant metastatic to lung/bone [NOV PSA 900-NOV 2021]; On Eligard ;  . NGS-F-ONE 2022- NEGATIVE for any targets. # MARCH 2025- PET scan shows progression-although Lesions are subcentimeter in size; also PSA is rising. APRIL 2025-Taxotere  chemotherapy-every 3 weeks x 6 cycles. #s/p  Taxotere   cycle #6 - # OCT 22 nd, 2025-  Mild progression of metastatic prostate cancer with increased size and activity of small left lung nodules;  New metastatic lesion in the L4 vertebral body. S/p SBRT to L4 - NOV 2025.   # Given the oligometastatic progression noted on above PET scan-s/p  SBRT to L4 lesions as planned.  Discussed with patient regarding consideration of Pluvicto in the future if continued increase in the lesions noted.   # Bone metastases- -discussed the role of Zometa to decrease skeletal related events [pain; fractures; need for radiation; hypercalcemia].  Discussed the potential side effects including but not limited to-infusion reaction; hypocalcemia; and Osteo-necrosis of jaw. Recommend ca+Vit D BID. Recommend dental clearance.   # HTN- OFF telmisartan   [as per [facility]]- stable.   # Hot flashes- G-1sec to ADT- stable.    # RUL nodule- ~2-3cm; cystic [on PET AUG 2022]-  No new dominant mass, fluid collection or lymph node enlargement in the thorax. on most recent PET scan in OCT 2024.  Will monitor on chemotherapy-stable.     # DM-on metformin-oral hypoglycemic drugs- monitor closley on chemo/steroids- RBG- 151 again recommend compliance with glucometer- check 2-3 times a day-  # Weight loss:s/p  nutrition/Joli re: weight loss- improved.   # IV access:s/p  Mediport placement.  # ACP: FULL code.   *eliagrd 45 -08/08/2023- PSA- poor surrogate.   PS # DISPOSITION:   # Eligard  today # Follow up in  1 months-- MD-;labs- cbc/cmp/PSA; vit D 25-OH-  - PSMA PET scan-  Dr.B   All questions were answered. The patient knows to call the clinic with any problems, questions or concerns.    Cindy JONELLE Joe, MD 03/12/2024 11:20 AM

## 2024-03-12 NOTE — Progress Notes (Signed)
"   No concerns today  "

## 2024-03-12 NOTE — Assessment & Plan Note (Addendum)
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 NGS-F-ONE 2022- NEGATIVE for any targets. # MARCH 2025- PET scan shows progression-although Lesions are subcentimeter in size; also PSA is rising. APRIL 2025-Taxotere  chemotherapy-every 3 weeks x 6 cycles. #s/p  Taxotere   cycle #6 - # OCT 22 nd, 2025-  Mild progression of metastatic prostate cancer with increased size and activity of small left lung nodules;  New metastatic lesion in the L4 vertebral body. S/p SBRT to L4 - NOV 2025.   # Given the oligometastatic progression noted on above PET scan-s/p  SBRT to L4 lesions as planned.  Discussed with patient regarding consideration of Pluvicto in the future if continued increase in the lesions noted.   # Bone metastases- -discussed the role of Zometa to decrease skeletal related events [pain; fractures; need for radiation; hypercalcemia].  Discussed the potential side effects including but not limited to-infusion reaction; hypocalcemia; and Osteo-necrosis of jaw. Recommend ca+Vit D BID. Recommend dental clearance.   # HTN- OFF telmisartan   [as per [facility]]- stable.   # Hot flashes- G-1sec to ADT- stable.    # RUL nodule- ~2-3cm; cystic [on PET AUG 2022]-  No new dominant mass, fluid collection or lymph node enlargement in the thorax. on most recent PET scan in OCT 2024.  Will monitor on chemotherapy-stable.     # DM-on metformin-oral hypoglycemic drugs- monitor closley on chemo/steroids- RBG- 151 again recommend compliance with glucometer- check 2-3 times a day-  # Weight loss:s/p  nutrition/Joli re: weight loss- improved.   # IV access:s/p  Mediport placement.  # ACP: FULL code.   *eliagrd 45 -08/08/2023- PSA- poor surrogate.   PS # DISPOSITION:   # Eligard  today # Follow up in  1 months-- MD-;labs- cbc/cmp/PSA; vit D 25-OH-  - PSMA PET scan-  Dr.B

## 2024-03-12 NOTE — Progress Notes (Signed)
 Radiation Oncology Follow up Note  Name: HELEN CUFF   Date:   03/12/2024 MRN:  [id number] DOB: [birth year]    This 84 y.o. male presents to the clinic today for 1 month follow-up status post palliative radiation therapy to L4 vertebral body and patient with known stage IV prostate cancer.  REFERRING PROVIDER: McMurtrey, Babette NOVAK, MD  HPI: Patient is an 84 year old male now out 1 month having completed SBRT to L4 vertebral body for metastatic prostate cancer.  Seen today in routine follow-up he is doing well he says he is completely pain-free.SABRA  He is currently being treated with Taxotere  through medical oncology.  Patient is ambulating well specifically denies any loss of strength in his lower extremities.  COMPLICATIONS OF TREATMENT: none  FOLLOW UP COMPLIANCE: keeps appointments   PHYSICAL EXAM:  There were no vitals taken for this visit. Range of motion of the lower extremities does not elicit pain.  Motor or sensory and DTR levels are equal and symmetric in the lower extremities.  Deep palpation of his lumbar spine does not elicit pain.  RADIOLOGY RESULTS: No current films for review  PLAN: The present time patient is achieving excellent palliative benefit from radiation therapy.  I will turn follow-up care over to medical oncology where he will continue systemic treatment with Taxotere .  I would be happy to reevaluate the patient anytime in the future should that be indicated.  I would like to take this opportunity to thank you for allowing me to participate in the care of your patient.SABRA Marcey Penton, MD

## 2024-03-13 ENCOUNTER — Other Ambulatory Visit: Payer: Self-pay

## 2024-03-13 ENCOUNTER — Encounter: Payer: Self-pay | Admitting: Internal Medicine

## 2024-03-28 ENCOUNTER — Encounter: Payer: Self-pay | Admitting: Internal Medicine

## 2024-04-09 ENCOUNTER — Encounter: Payer: Self-pay | Admitting: Internal Medicine

## 2024-04-11 ENCOUNTER — Ambulatory Visit: Admission: RE | Admit: 2024-04-11 | Payer: Medicare (Managed Care) | Source: Ambulatory Visit

## 2024-04-11 DIAGNOSIS — C61 Malignant neoplasm of prostate: Secondary | ICD-10-CM

## 2024-04-11 MED ORDER — FLOTUFOLASTAT F 18 GALLIUM 296-5846 MBQ/ML IV SOLN
8.4700 | Freq: Once | INTRAVENOUS | Status: AC
  Administered 2024-04-11: 8.47 via INTRAVENOUS

## 2024-04-20 ENCOUNTER — Inpatient Hospital Stay: Payer: Medicare (Managed Care) | Admitting: Internal Medicine

## 2024-04-20 ENCOUNTER — Inpatient Hospital Stay: Payer: Medicare (Managed Care)
